# Patient Record
Sex: Female | Born: 1960 | Race: Black or African American | Hispanic: No | Marital: Single | Smoking: Current every day smoker
Health system: Southern US, Community
[De-identification: ages and names within clinical notes are randomized; demographics above are authoritative.]

## PROBLEM LIST (undated history)

## (undated) DIAGNOSIS — R4182 Altered mental status, unspecified: Secondary | ICD-10-CM

## (undated) DIAGNOSIS — F17200 Nicotine dependence, unspecified, uncomplicated: Secondary | ICD-10-CM

## (undated) DIAGNOSIS — M329 Systemic lupus erythematosus, unspecified: Secondary | ICD-10-CM

## (undated) DIAGNOSIS — M549 Dorsalgia, unspecified: Secondary | ICD-10-CM

## (undated) DIAGNOSIS — I213 ST elevation (STEMI) myocardial infarction of unspecified site: Secondary | ICD-10-CM

## (undated) DIAGNOSIS — E785 Hyperlipidemia, unspecified: Secondary | ICD-10-CM

## (undated) DIAGNOSIS — A048 Other specified bacterial intestinal infections: Secondary | ICD-10-CM

## (undated) DIAGNOSIS — K259 Gastric ulcer, unspecified as acute or chronic, without hemorrhage or perforation: Secondary | ICD-10-CM

## (undated) DIAGNOSIS — I10 Essential (primary) hypertension: Secondary | ICD-10-CM

## (undated) DIAGNOSIS — Z720 Tobacco use: Secondary | ICD-10-CM

## (undated) DIAGNOSIS — IMO0002 Reserved for concepts with insufficient information to code with codable children: Secondary | ICD-10-CM

## (undated) HISTORY — DX: Other specified bacterial intestinal infections: A04.8

## (undated) HISTORY — DX: Altered mental status, unspecified: R41.82

## (undated) HISTORY — PX: POLYPECTOMY: SHX149

## (undated) HISTORY — DX: Gastric ulcer, unspecified as acute or chronic, without hemorrhage or perforation: K25.9

## (undated) HISTORY — DX: Tobacco use: Z72.0

## (undated) HISTORY — DX: Essential (primary) hypertension: I10

## (undated) HISTORY — DX: Dorsalgia, unspecified: M54.9

---
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
## 2000-04-28 ENCOUNTER — Emergency Department (HOSPITAL_COMMUNITY): Admission: EM | Admit: 2000-04-28 | Discharge: 2000-04-28 | Payer: Self-pay | Admitting: Emergency Medicine

## 2000-04-29 ENCOUNTER — Encounter: Admission: RE | Admit: 2000-04-29 | Discharge: 2000-04-29 | Payer: Self-pay | Admitting: Internal Medicine

## 2007-07-16 ENCOUNTER — Emergency Department (HOSPITAL_COMMUNITY): Admission: EM | Admit: 2007-07-16 | Discharge: 2007-07-16 | Payer: Self-pay | Admitting: Emergency Medicine

## 2011-01-29 HISTORY — PX: UPPER GASTROINTESTINAL ENDOSCOPY: SHX188

## 2011-02-16 ENCOUNTER — Encounter (HOSPITAL_COMMUNITY): Payer: Self-pay | Admitting: *Deleted

## 2011-02-16 ENCOUNTER — Emergency Department (INDEPENDENT_AMBULATORY_CARE_PROVIDER_SITE_OTHER)
Admission: EM | Admit: 2011-02-16 | Discharge: 2011-02-16 | Disposition: A | Discharge status: Nursing Home (Includes ICF) | Payer: PRIVATE HEALTH INSURANCE | Source: Home / Self Care

## 2011-02-16 ENCOUNTER — Emergency Department (HOSPITAL_COMMUNITY)
Admission: EM | Admit: 2011-02-16 | Discharge: 2011-02-16 | Disposition: A | Discharge status: Home / Self Care | Payer: PRIVATE HEALTH INSURANCE | Attending: Emergency Medicine | Admitting: Emergency Medicine

## 2011-02-16 DIAGNOSIS — M79609 Pain in unspecified limb: Secondary | ICD-10-CM

## 2011-02-16 DIAGNOSIS — L03211 Cellulitis of face: Secondary | ICD-10-CM | POA: Insufficient documentation

## 2011-02-16 DIAGNOSIS — R110 Nausea: Secondary | ICD-10-CM | POA: Insufficient documentation

## 2011-02-16 DIAGNOSIS — M79606 Pain in leg, unspecified: Secondary | ICD-10-CM

## 2011-02-16 DIAGNOSIS — L0201 Cutaneous abscess of face: Secondary | ICD-10-CM

## 2011-02-16 DIAGNOSIS — M79605 Pain in left leg: Secondary | ICD-10-CM

## 2011-02-16 MED ORDER — OXYCODONE-ACETAMINOPHEN 5-325 MG PO TABS: 2.0000 | ORAL_TABLET | Freq: Four times a day (QID) | ORAL | Status: DC | PRN

## 2011-02-16 MED ORDER — SULFAMETHOXAZOLE-TMP DS 800-160 MG PO TABS
2.0000 | ORAL_TABLET | Freq: Once | ORAL | Status: AC
  Administered 2011-02-16: 2 via ORAL
  Filled 2011-02-16: qty 2

## 2011-02-16 MED ORDER — ONDANSETRON 4 MG PO TBDP
8.0000 mg | ORAL_TABLET | Freq: Once | ORAL | Status: AC
  Administered 2011-02-16: 8 mg via ORAL
  Filled 2011-02-16: qty 2

## 2011-02-16 MED ORDER — LIDOCAINE-PRILOCAINE 2.5-2.5 % EX CREA
TOPICAL_CREAM | Freq: Once | CUTANEOUS | Status: DC
  Filled 2011-02-16: qty 5

## 2011-02-16 MED ORDER — CEPHALEXIN 250 MG PO CAPS
500.0000 mg | ORAL_CAPSULE | Freq: Once | ORAL | Status: AC
  Administered 2011-02-16: 500 mg via ORAL
  Filled 2011-02-16: qty 2

## 2011-02-16 MED ORDER — OXYCODONE-ACETAMINOPHEN 5-325 MG PO TABS
2.0000 | ORAL_TABLET | Freq: Once | ORAL | Status: AC
  Administered 2011-02-16: 2 via ORAL
  Filled 2011-02-16: qty 2

## 2011-02-16 MED ORDER — CEPHALEXIN 500 MG PO CAPS: 500.0000 mg | ORAL_CAPSULE | Freq: Four times a day (QID) | ORAL | Status: DC

## 2011-02-16 MED ORDER — LIDOCAINE-EPINEPHRINE-TETRACAINE (LET) SOLUTION
3.0000 mL | Freq: Once | NASAL | Status: DC
  Filled 2011-02-16: qty 3

## 2011-02-16 MED ORDER — SULFAMETHOXAZOLE-TMP DS 800-160 MG PO TABS: 2.0000 | ORAL_TABLET | Freq: Two times a day (BID) | ORAL | Status: DC

## 2011-02-16 NOTE — ED Provider Notes (Signed)
History     CSN: [id number]  Arrival date & time 02/16/11  1036   None     Chief Complaint  Patient presents with  . Leg Pain    (Consider location/radiation/quality/duration/timing/severity/associated sxs/prior treatment) HPI Comments: Patient reports pain x 4 days and swelling x 3 days in her left calf.  Pain is worse with palpation and attempting to bear weight on that leg.  States pain and swelling have gotten progressively worse over 4 days.  Pt denies injury to the area.  Denies fevers, CP, SOB.  No recent immobilization, no exogenous estrogen, no personal or family hx of blood clots.  Pt is a smoker.    Patient is a 51 y.o. female presenting with leg pain. The history is provided by the patient.  Leg Pain     History reviewed. No pertinent past medical history.  History reviewed. No pertinent past surgical history.  History reviewed. No pertinent family history.  History  Substance Use Topics  . Smoking status: Current Everyday Smoker  . Smokeless tobacco: Not on file  . Alcohol Use: Yes    OB History    Grav Para Term Preterm Abortions TAB SAB Ect Mult Living                  Review of Systems  Constitutional: Negative for fever.  Respiratory: Negative for shortness of breath.   Cardiovascular: Positive for leg swelling. Negative for chest pain.  All other systems reviewed and are negative.    Allergies  Review of patient's allergies indicates no known allergies.  Home Medications   Current Outpatient Rx  Name Route Sig Dispense Refill  . OVER THE COUNTER MEDICATION  Goody powder this am      BP 130/75  Pulse 93  Temp(Src) 98.8 F (37.1 C) (Oral)  Resp 20  SpO2 100%  Physical Exam  Nursing note and vitals reviewed. Constitutional: She is oriented to person, place, and time. She appears well-developed and well-nourished.  HENT:  Head: Normocephalic and atraumatic.  Neck: Neck supple.  Cardiovascular: Normal rate, regular rhythm and normal  heart sounds.   Pulmonary/Chest: Effort normal. No respiratory distress. She has no wheezes. She has no rales.  Musculoskeletal:       Left lower leg: She exhibits tenderness and swelling.       Legs: Neurological: She is alert and oriented to person, place, and time.    ED Course  Procedures (including critical care time)  Labs Reviewed - No data to display No results found.   1. Pain, lower extremity       MDM  Patient with pain and swelling of left calf.  Concern for DVT.  Patient's only known risk factor for clot is smoking.  No signs or symptoms of PE.  Clinically no cellulitis.  Transferred to [facility] Emergency Department for further evaluation.          Rise Patience, [place] 02/16/11 1203

## 2011-02-16 NOTE — ED Notes (Signed)
Per pt awoke Wednesday am with pain left lower leg pain and swelling - increasingly worse pain with walking - abscess forehead draining onset x one week - pt squeezed and drained x one week ago - remains with redness and swelling

## 2011-02-16 NOTE — ED Provider Notes (Signed)
History     CSN: [id number]  Arrival date & time 02/16/11  1218   First MD Initiated Contact with Patient 02/16/11 1226      Chief Complaint  Patient presents with  . Leg Pain    (Consider location/radiation/quality/duration/timing/severity/associated sxs/prior treatment) HPI Patient is a 51 year old female who was seen initially this morning at urgent care for left leg pain. Patient has had left calf pain for the past 4 days. She's never had anything like this before. Patient has been on no recent long trips and denies any injury to the area. She's not had any chest pain or shortness of breath. She does not take any hormones and has no personal or family history of blood clots. She's not on any hormones for any reason. She does smoke. Patient also has a visible lesion over the right for head. There is a central head to this. There is appreciable fluctuance with this. Patient has no history of boils or cellulitis. She does report that she has been draining pus out of this by pressing on it. She denies any fevers, nausea, or vomiting. She is not a diabetic and has no history of health problems. She says the pain with this was minimal. Pain in the left leg is aching and a 6/10. History reviewed. No pertinent past medical history.  History reviewed. No pertinent past surgical history.  History reviewed. No pertinent family history.  History  Substance Use Topics  . Smoking status: Current Everyday Smoker  . Smokeless tobacco: Not on file  . Alcohol Use: Yes    OB History    Grav Para Term Preterm Abortions TAB SAB Ect Mult Living                  Review of Systems  Constitutional: Negative.   HENT: Negative.   Eyes: Negative.   Respiratory: Negative.   Cardiovascular: Positive for leg swelling.  Gastrointestinal: Negative.   Genitourinary: Negative.   Musculoskeletal:       Left calf tenderness  Skin:       Right forehead lesion  Neurological: Negative.   Hematological:  Negative.   Psychiatric/Behavioral: Negative.   All other systems reviewed and are negative.    Allergies  Review of patient's allergies indicates no known allergies.  Home Medications  No current outpatient prescriptions on file.  BP 113/72  Pulse 94  Temp(Src) 98.5 F (36.9 C) (Oral)  Resp 20  SpO2 97%  Physical Exam  Nursing note and vitals reviewed. Constitutional: She is oriented to person, place, and time. She appears well-developed and well-nourished. No distress.  HENT:  Head: Normocephalic and atraumatic.  Eyes: Conjunctivae and EOM are normal. Pupils are equal, round, and reactive to light.  Neck: Normal range of motion.  Cardiovascular: Normal rate, regular rhythm, normal heart sounds and intact distal pulses.  Exam reveals no gallop and no friction rub.   No murmur heard. Pulmonary/Chest: Effort normal and breath sounds normal. No respiratory distress. She has no wheezes. She has no rales.  Abdominal: Soft. Bowel sounds are normal. She exhibits no distension. There is no tenderness. There is no rebound and no guarding.  Musculoskeletal: Normal range of motion. She exhibits tenderness. She exhibits no edema.       Tenderness to palpation over the left  Neurological: She is alert and oriented to person, place, and time. No cranial nerve deficit. She exhibits normal muscle tone. Coordination normal.  Skin: Skin is warm and dry.  Lesion over the right for habitus a half inch in diameter with a central scabbed over her head. This is erythematous with underlying fluctuance.  Psychiatric: She has a normal mood and affect.    ED Course  Procedures (including critical care time)  Labs Reviewed - No data to display No results found.   1. Leg pain, left   2. Facial cellulitis   3. Abscess of forehead       MDM  Patient was evaluated and given 2 tabs of Percocet by mouth for pain. Ultrasound of the left lower extremity was ordered. Given patient's lesion on  her 4 head this should be treated as a cellulitis. Patient was given Keflex 500 mg by mouth as well as 2 tabs of Bactrim by mouth. EMLA cream was ordered to numb the area.  Patient had no DVT on ultrasound. There was prolonged. To receive numbing cream for patient's lesion on her face. Patient preferred to continue warm compresses at home. She's had success with drainage of the area I was comfortable with this. Patient was given her first dose of antibiotics here. She did feel nauseous here and required Zofran orally. Patient was discharged with 10 days worth of antibiotics for her facial cellulitis. She was also given pain medication for her leg pain. She was discharged in good condition told to return if she had any other emergent concerns.        Cyndra Numbers, MD 02/16/11 2114

## 2011-02-16 NOTE — Progress Notes (Signed)
*  PRELIMINARY RESULTS*  Left lower extremity venous Dopplers completed.  There is no DVT or SVT noted in the left lower extremity.  There is an area of mixed echoes noted in the mid, posterior calf, etiology unknown.    Sherren Kerns Renee 02/16/2011, 2:17 PM

## 2011-02-16 NOTE — ED Notes (Signed)
Pt transferrred here from [facility] to r/o dvt left leg.

## 2011-02-16 NOTE — ED Provider Notes (Signed)
Medical screening examination/treatment/procedure(s) were performed by non-physician practitioner and as supervising physician I was immediately available for consultation/collaboration.  Corrie Mckusick, MD 02/16/11 2137

## 2011-02-22 ENCOUNTER — Ambulatory Visit: Payer: Self-pay | Admitting: Family Medicine

## 2011-02-26 ENCOUNTER — Emergency Department (HOSPITAL_COMMUNITY): Payer: PRIVATE HEALTH INSURANCE

## 2011-02-26 ENCOUNTER — Other Ambulatory Visit: Payer: Self-pay

## 2011-02-26 ENCOUNTER — Ambulatory Visit: Payer: Self-pay | Admitting: Family Medicine

## 2011-02-26 ENCOUNTER — Inpatient Hospital Stay (HOSPITAL_COMMUNITY)
Admission: EM | Admit: 2011-02-26 | Discharge: 2011-02-28 | DRG: 864 | Disposition: A | Discharge status: Home / Self Care | Payer: PRIVATE HEALTH INSURANCE | Attending: Internal Medicine | Admitting: Internal Medicine

## 2011-02-26 ENCOUNTER — Encounter (HOSPITAL_COMMUNITY): Payer: Self-pay | Admitting: *Deleted

## 2011-02-26 DIAGNOSIS — N179 Acute kidney failure, unspecified: Secondary | ICD-10-CM | POA: Diagnosis present

## 2011-02-26 DIAGNOSIS — L03211 Cellulitis of face: Secondary | ICD-10-CM | POA: Diagnosis present

## 2011-02-26 DIAGNOSIS — R51 Headache: Secondary | ICD-10-CM | POA: Diagnosis present

## 2011-02-26 DIAGNOSIS — E871 Hypo-osmolality and hyponatremia: Secondary | ICD-10-CM | POA: Diagnosis present

## 2011-02-26 DIAGNOSIS — E878 Other disorders of electrolyte and fluid balance, not elsewhere classified: Secondary | ICD-10-CM | POA: Diagnosis present

## 2011-02-26 DIAGNOSIS — E860 Dehydration: Secondary | ICD-10-CM | POA: Diagnosis present

## 2011-02-26 DIAGNOSIS — R509 Fever, unspecified: Principal | ICD-10-CM | POA: Diagnosis present

## 2011-02-26 DIAGNOSIS — R7989 Other specified abnormal findings of blood chemistry: Secondary | ICD-10-CM | POA: Diagnosis present

## 2011-02-26 DIAGNOSIS — L0201 Cutaneous abscess of face: Secondary | ICD-10-CM | POA: Diagnosis present

## 2011-02-26 LAB — DIFFERENTIAL
Basophils Absolute: 0 10*3/uL (ref 0.0–0.1)
Basophils Relative: 0 % (ref 0–1)
Eosinophils Absolute: 0 10*3/uL (ref 0.0–0.7)
Eosinophils Relative: 0 % (ref 0–5)
Lymphocytes Relative: 17 % (ref 12–46)
Lymphs Abs: 0.9 10*3/uL (ref 0.7–4.0)
Monocytes Absolute: 0.2 10*3/uL (ref 0.1–1.0)
Monocytes Relative: 4 % (ref 3–12)
Neutro Abs: 3.9 10*3/uL (ref 1.7–7.7)
Neutrophils Relative %: 78 % — ABNORMAL HIGH (ref 43–77)

## 2011-02-26 LAB — COMPREHENSIVE METABOLIC PANEL
ALT: 168 U/L — ABNORMAL HIGH (ref 0–35)
AST: 231 U/L — ABNORMAL HIGH (ref 0–37)
Albumin: 3.2 g/dL — ABNORMAL LOW (ref 3.5–5.2)
Alkaline Phosphatase: 157 U/L — ABNORMAL HIGH (ref 39–117)
BUN: 21 mg/dL (ref 6–23)
CO2: 20 mEq/L (ref 19–32)
Calcium: 9 mg/dL (ref 8.4–10.5)
Chloride: 94 mEq/L — ABNORMAL LOW (ref 96–112)
Creatinine, Ser: 1.54 mg/dL — ABNORMAL HIGH (ref 0.50–1.10)
GFR calc Af Amer: 45 mL/min — ABNORMAL LOW (ref >90)
GFR calc non Af Amer: 39 mL/min — ABNORMAL LOW (ref >90)
Glucose, Bld: 105 mg/dL — ABNORMAL HIGH (ref 70–99)
Potassium: 4.1 mEq/L (ref 3.5–5.1)
Sodium: 129 mEq/L — ABNORMAL LOW (ref 135–145)
Total Bilirubin: 0.1 mg/dL — ABNORMAL LOW (ref 0.3–1.2)
Total Protein: 8.5 g/dL — ABNORMAL HIGH (ref 6.0–8.3)

## 2011-02-26 LAB — URINE MICROSCOPIC-ADD ON

## 2011-02-26 LAB — CBC
HCT: 38.1 % (ref 36.0–46.0)
Hemoglobin: 13.1 g/dL (ref 12.0–15.0)
MCH: 29 pg (ref 26.0–34.0)
MCHC: 34.4 g/dL (ref 30.0–36.0)
MCV: 84.3 fL (ref 78.0–100.0)
Platelets: 239 10*3/uL (ref 150–400)
RBC: 4.52 MIL/uL (ref 3.87–5.11)
RDW: 13.1 % (ref 11.5–15.5)
WBC: 5 10*3/uL (ref 4.0–10.5)

## 2011-02-26 LAB — URINALYSIS, ROUTINE W REFLEX MICROSCOPIC
Bilirubin Urine: NEGATIVE
Glucose, UA: NEGATIVE mg/dL
Hgb urine dipstick: NEGATIVE
Ketones, ur: NEGATIVE mg/dL
Nitrite: NEGATIVE
Protein, ur: 30 mg/dL — AB
Specific Gravity, Urine: 1.014 (ref 1.005–1.030)
Urobilinogen, UA: 1 mg/dL (ref 0.0–1.0)
pH: 5.5 (ref 5.0–8.0)

## 2011-02-26 LAB — POCT PREGNANCY, URINE: Preg Test, Ur: NEGATIVE

## 2011-02-26 LAB — LIPASE, BLOOD: Lipase: 46 U/L (ref 11–59)

## 2011-02-26 LAB — PROTIME-INR
INR: 1.14 (ref 0.00–1.49)
Prothrombin Time: 14.8 seconds (ref 11.6–15.2)

## 2011-02-26 LAB — LACTIC ACID, PLASMA: Lactic Acid, Venous: 0.9 mmol/L (ref 0.5–2.2)

## 2011-02-26 MED ORDER — SODIUM CHLORIDE 0.9 % IV BOLUS (SEPSIS)
1000.0000 mL | Freq: Once | INTRAVENOUS | Status: AC
  Administered 2011-02-26: 1000 mL via INTRAVENOUS

## 2011-02-26 MED ORDER — ZOLPIDEM TARTRATE 5 MG PO TABS: 5.0000 mg | ORAL_TABLET | Freq: Every evening | ORAL | Status: DC | PRN

## 2011-02-26 MED ORDER — OXYCODONE HCL 5 MG PO TABS
5.0000 mg | ORAL_TABLET | ORAL | Status: DC | PRN
  Administered 2011-02-27: 5 mg via ORAL
  Filled 2011-02-26: qty 1

## 2011-02-26 MED ORDER — ONDANSETRON HCL 4 MG/2ML IJ SOLN: 4.0000 mg | Freq: Four times a day (QID) | INTRAMUSCULAR | Status: DC | PRN

## 2011-02-26 MED ORDER — ALUM & MAG HYDROXIDE-SIMETH 200-200-20 MG/5ML PO SUSP: 30.0000 mL | Freq: Four times a day (QID) | ORAL | Status: DC | PRN

## 2011-02-26 MED ORDER — ACETAMINOPHEN 500 MG PO TABS
1000.0000 mg | ORAL_TABLET | Freq: Once | ORAL | Status: AC
  Administered 2011-02-26: 1000 mg via ORAL
  Filled 2011-02-26: qty 2

## 2011-02-26 MED ORDER — OSELTAMIVIR PHOSPHATE 75 MG PO CAPS
75.0000 mg | ORAL_CAPSULE | Freq: Once | ORAL | Status: AC
  Administered 2011-02-26: 75 mg via ORAL
  Filled 2011-02-26: qty 1

## 2011-02-26 MED ORDER — OSELTAMIVIR PHOSPHATE 75 MG PO CAPS
75.0000 mg | ORAL_CAPSULE | Freq: Two times a day (BID) | ORAL | Status: DC
  Administered 2011-02-26 – 2011-02-28 (×4): 75 mg via ORAL
  Filled 2011-02-26 (×5): qty 1

## 2011-02-26 MED ORDER — ONDANSETRON HCL 4 MG/2ML IJ SOLN
4.0000 mg | Freq: Once | INTRAMUSCULAR | Status: AC
  Administered 2011-02-26: 4 mg via INTRAVENOUS
  Filled 2011-02-26: qty 2

## 2011-02-26 MED ORDER — MORPHINE SULFATE 4 MG/ML IJ SOLN
4.0000 mg | Freq: Once | INTRAMUSCULAR | Status: AC
  Administered 2011-02-26: 4 mg via INTRAVENOUS
  Filled 2011-02-26: qty 1

## 2011-02-26 MED ORDER — SODIUM CHLORIDE 0.9 % IV SOLN
INTRAVENOUS | Status: DC
  Administered 2011-02-27: 02:00:00 via INTRAVENOUS
  Administered 2011-02-27: 1000 mL via INTRAVENOUS
  Administered 2011-02-28: 09:00:00 via INTRAVENOUS

## 2011-02-26 MED ORDER — ONDANSETRON HCL 4 MG PO TABS: 4.0000 mg | ORAL_TABLET | Freq: Four times a day (QID) | ORAL | Status: DC | PRN

## 2011-02-26 NOTE — ED Notes (Signed)
Pt reports headache, recurrent skin infections, and vomiting x 2 days. Reports knot in forehead, knot to right side of neck and pain to left lower leg. Pt had an ultrasound to r/o DVT to left leg last week. Reports unable to keep food/fluids down x 2 days. Headache since Saturday.

## 2011-02-26 NOTE — H&P (Signed)
History and Physical  Samantha [name] WUJ:[id number] DOB: 1960/10/07 DOA: 02/26/2011  Referring physician: Judie Petit. Alto Denver, MD PCP: None  Chief Complaint: Vomiting.  HPI:  51 year old woman presented to the emergency department today with complaint of headache for the last several days (now resolved), myalgias, nausea, vomiting for 2 days. She did not get the flu vaccine. She has not been able to eat or drink for several days to it was unable to finish her antibiotics secondary to nausea and vomiting. She had leg pain in the left leg a week ago however this has subsequently resolved. She put alcohol on her leg because it was sore and now has a rash on that side.   Workup in the emergency department was notable for hyponatremia, renal dysfunction, dehydration, elevated LFTs. While in the emergency department she suddenly became febrile (103.2). Remainder of vital signs were unremarkable. Abdominal ultrasound was unremarkable, as was CT of the head and chest x-ray. Blood cultures and urine culture obtained. Patient was referred for admission.   January 19: Seen in the emergency department: Presented for leg pain. Ultrasound was negative for DVT. On facial cellulitis/abscess of the head. Placed on Keflex and Bactrim. Patient refused incision and drainage.  Review of Systems:  Negative for fever at home, changes to her vision, sore throat, chest pain, shortness of breath, cough, dysuria, bleeding, diarrhea, abdominal pain.  Positive for recent facial cellulitis which has resolved, rash left lower leg, otherwise as above.  Past medical history: None.   Past surgical history: None.   Social History:  reports that she has been smoking.  She does not have any smokeless tobacco history on file. She reports that she drinks alcohol. She reports that she does not use illicit drugs.  No Known Allergies  Family History  Problem Relation Age of Onset  . Diabetes Sister     Prior to Admission medications     Medication Sig Start Date End Date Taking? Authorizing Provider  cephALEXin (KEFLEX) 500 MG capsule Take 500 mg by mouth 4 (four) times daily. 10 day course 02/15/11  Yes Meagan Hunt, MD  oxyCODONE-acetaminophen (PERCOCET) 5-325 MG per tablet Take 2 tablets by mouth every 6 (six) hours as needed. 02/16/11 02/26/11 Yes Meagan Hunt, MD  sulfamethoxazole-trimethoprim (BACTRIM DS) 800-160 MG per tablet Take 2 tablets by mouth 2 (two) times daily. 10 day course 02/15/11  Yes Cyndra Numbers, MD   Physical Exam: Filed Vitals:   02/26/11 0949 02/26/11 1528 02/26/11 1653  BP: 114/68 132/70 104/58  Pulse: 104 104 93  Temp: 98.7 F (37.1 C) 103.2 F (39.6 C) 100.7 F (38.2 C)  TempSrc: Oral Oral Oral  Resp: 16    SpO2: 97% 97% 98%     General:  Appears calm, mildly ill. Nontoxic.   Eyes: Pupils equal, round, react to light. Normal lids, irises, conjunctiva.   ENT: Hearing grossly normal. Gross normal lips and tongue.   Neck: Anterior cervical lymphadenopathy, nontender. No thyromegaly.   Cardiovascular: Regular rate and rhythm. No murmur, rub, gallop. No lower extremity edema.   Respiratory: Clear to auscultation bilaterally. No wheezes, rales, rhonchi. Normal respiratory effort.   Abdomen: Soft, nontender, nondistended.   Skin: No acute or rash over the anterior surface of the left lower leg. Nontender. No purpura, petechiae or confluence. No fluctuance. Has the appearance of a razor burn. The face/forehead appears to be healing well. No evidence of cellulitis.  Musculoskeletal: No pain in left thigh or calf. Grossly unremarkable.   Psychiatric: Grossly  normal mood and affect. Speech is fluent and appropriate.   Neurologic: Grossly unremarkable.  Labs on Admission:  Basic Metabolic Panel:  Lab 02/26/11 4540  NA 129*  K 4.1  CL 94*  CO2 20  GLUCOSE 105*  BUN 21  CREATININE 1.54*  CALCIUM 9.0  MG --  PHOS --   Liver Function Tests:  Lab 02/26/11 1034  AST 231*  ALT 168*   ALKPHOS 157*  BILITOT 0.1*  PROT 8.5*  ALBUMIN 3.2*    Lab 02/26/11 1034  LIPASE 46  AMYLASE --   CBC:  Lab 02/26/11 1034  WBC 5.0  NEUTROABS 3.9  HGB 13.1  HCT 38.1  MCV 84.3  PLT 239    Radiological Exams on Admission: Dg Chest 2 View  02/26/2011  *RADIOLOGY REPORT*  Clinical Data:  Headache and vomiting  CHEST - 2 VIEW  Comparison: Chest radiograph 07/16/2007  Findings: Normal mediastinum and cardiac silhouette.  Normal pulmonary  vasculature.  No evidence of effusion, infiltrate, or pneumothorax.  No acute bony abnormality.  IMPRESSION: No acute cardiopulmonary process.  Original Report Authenticated By: Genevive Bi, M.D.   Ct Head Wo Contrast  02/26/2011  *RADIOLOGY REPORT*  Clinical Data: Headache, nausea, vomiting.  CT HEAD WITHOUT CONTRAST  Technique:  Contiguous axial images were obtained from the base of the skull through the vertex without contrast.  Comparison: None.  Findings: No acute intracranial abnormality.  Specifically, no hemorrhage, hydrocephalus, mass lesion, acute infarction, or significant intracranial injury.  No acute calvarial abnormality. Visualized paranasal sinuses and mastoids clear.  Orbital soft tissues unremarkable.  IMPRESSION: Normal study.  Original Report Authenticated By: Cyndie Chime, M.D.   US Abdomen Complete  02/26/2011  *RADIOLOGY REPORT*  Clinical Data:  Elevated liver function tests.  Nausea and vomiting.  COMPLETE ABDOMINAL ULTRASOUND 02/26/2011:  Comparison:  None.  Findings:  Gallbladder:  No shadowing gallstones or echogenic sludge.  No gallbladder wall thickening or pericholecystic fluid.  Negative sonographic Murphy's sign according to the ultrasound technologist.  Common bile duct:  Normal in caliber with maximum diameter approximating 4 mm.  Liver:  Normal size and echotexture without focal parenchymal abnormality.  Patent portal vein with hepatopetal flow.  IVC:  Patent.  Pancreas:  Normal size and echotexture without focal  parenchymal abnormality.  Spleen:  Normal size and echotexture without focal parenchymal abnormality.  Right Kidney:  No hydronephrosis.  Well-preserved cortex.  Normal parenchymal echotexture.  Simple cyst arising from the upper pole measuring approximately 6.6 x 5.0 x 5.4 cm.  No significant focal parenchymal abnormality.  No visible shadowing calculi. Approximately 12.9 cm in length.  Left Kidney:  No hydronephrosis.  Well-preserved cortex.  No shadowing calculi.  Normal size and parenchymal echotexture without focal abnormalities.  Approximately 13.2 cm in length.  Abdominal aorta:  Normal in caliber throughout its visualized course in the abdomen without significant atherosclerosis.  IMPRESSION:  1.  No abnormalities involving the liver, gallbladder, or biliary tree to explain elevated liver function tests. 2.  Approximate 6.6 cm simple cyst arising from the upper pole of the right kidney.  No significant abnormalities.  Original Report Authenticated By: Arnell Sieving, M.D.    EKG: Independently reviewed.  Sinus rhythm, bilateral atrial enlargement, left ventricular hypertrophy. No acute changes seen. No previous EKG available for comparison.   Assessment/Plan 1. Febrile illness: Myalgias, nausea, vomiting, headache. Somewhat suspicious for influenza. Empiric Tamiflu. IV fluids and supportive care. The patient is nontoxic and lactic acid is normal. Doubt bacterial  infection at this point. Follow clinically. 2. Dehydration/Hyponatremia/hypochloremia: Secondary to nausea and vomiting and poor oral intake. IV fluids. Repeat metabolic panel in the morning. 3. Acute renal failure versus chronic kidney disease: No previous studies available for comparison purposes. IV fluids. Recheck metabolic panel morning. 4. Mixed elevation of liver function tests: Etiology and significance unclear. Repeat hepatic function panel in the morning. Normal lipase and no right upper quadrant pain or discrete symptoms  referable to the gallbladder. Ultrasound was unremarkable. Could consider acute hepatitis panel if no improvement. 5. Recent facial cellulitis/abscess: Appears resolved. Will not continue antibiotics at this point.  Code Status: Full code. Family Communication: Discussed with sister and mother at bedside. Disposition Plan: Home when improved.  Brendia Sacks, MD  [facility] Pager [phone] 02/26/2011, 5:03 PM

## 2011-02-26 NOTE — ED Notes (Signed)
Attempted to call report. Nurse unavailable.  Will call back for report.

## 2011-02-26 NOTE — ED Notes (Signed)
Notified MD of pt. Elevated temp.

## 2011-02-26 NOTE — H&P (Deleted)
History and Physical  Samantha [name] WJX:[id number] DOB: Oct 10, 1960 DOA: 02/26/2011  Referring physician: Judie Petit. Alto [place], MD PCP: None  Chief Complaint: Vomiting.  HPI:  51 year old woman presented to the emergency department today with complaint of headache for the last several days (now resolved), myalgias, nausea, vomiting for 2 days. She did not get the flu vaccine. She has not been able to eat or drink for several days to it was unable to finish her antibiotics secondary to nausea and vomiting. She had leg pain in the left leg a week ago however this has subsequently resolved. She put alcohol on her leg because it was sore and now has a rash on that side.   Workup in the emergency department was notable for hyponatremia, renal dysfunction, dehydration, elevated LFTs. While in the emergency department she suddenly became febrile (103.2). Remainder of vital signs were unremarkable. Abdominal ultrasound was unremarkable, as was CT of the head and chest x-ray. Blood cultures and urine culture obtained. Patient was referred for admission.   January 19: Seen in the emergency department: Presented for leg pain. Ultrasound was negative for DVT. On facial cellulitis/abscess of the head. Placed on Keflex and Bactrim. Patient refused incision and drainage.  Review of Systems:  Negative for fever at home, changes to her vision, sore throat, chest pain, shortness of breath, cough, dysuria, bleeding, diarrhea, abdominal pain.  Positive for recent facial cellulitis which has resolved, rash left lower leg, otherwise as above.  Past medical history: None.   Past surgical history: None.   Social History:  reports that she has been smoking.  She does not have any smokeless tobacco history on file. She reports that she drinks alcohol. She reports that she does not use illicit drugs.  No Known Allergies  Family History  Problem Relation Age of Onset  . Diabetes Sister     Prior to Admission medications     Medication Sig Start Date End Date Taking? Authorizing Provider  cephALEXin (KEFLEX) 500 MG capsule Take 500 mg by mouth 4 (four) times daily. 10 day course 02/15/11  Yes Meagan Hunt, MD  oxyCODONE-acetaminophen (PERCOCET) 5-325 MG per tablet Take 2 tablets by mouth every 6 (six) hours as needed. 02/16/11 02/26/11 Yes Meagan Hunt, MD  sulfamethoxazole-trimethoprim (BACTRIM DS) 800-160 MG per tablet Take 2 tablets by mouth 2 (two) times daily. 10 day course 02/15/11  Yes Cyndra Numbers, MD   Physical Exam: Filed Vitals:   02/26/11 0949 02/26/11 1528 02/26/11 1653 02/26/11 1839  BP: 114/68 132/70 104/58 102/64  Pulse: 104 104 93 98  Temp: 98.7 F (37.1 C) 103.2 F (39.6 C) 100.7 F (38.2 C) 100.2 F (37.9 C)  TempSrc: Oral Oral Oral Oral  Resp: 16   16  Height:    5\' 8"  (1.727 m)  Weight:    64.638 kg (142 lb 8 oz)  SpO2: 97% 97% 98% 98%     General:  Appears calm, mildly ill. Nontoxic.   Eyes: Pupils equal, round, react to light. Normal lids, irises, conjunctiva.   ENT: Hearing grossly normal. Gross normal lips and tongue.   Neck: Anterior cervical lymphadenopathy, nontender. No thyromegaly.   Cardiovascular: Regular rate and rhythm. No murmur, rub, gallop. No lower extremity edema.   Respiratory: Clear to auscultation bilaterally. No wheezes, rales, rhonchi. Normal respiratory effort.   Abdomen: Soft, nontender, nondistended.   Skin: No acute or rash over the anterior surface of the left lower leg. Nontender. No purpura, petechiae or confluence. No fluctuance. Has the  appearance of a razor burn. The face/forehead appears to be healing well. No evidence of cellulitis.  Musculoskeletal: No pain in left thigh or calf. Grossly unremarkable.   Psychiatric: Grossly normal mood and affect. Speech is fluent and appropriate.   Neurologic: Grossly unremarkable.  Labs on Admission:  Basic Metabolic Panel:  Lab 02/26/11 9563  NA 129*  K 4.1  CL 94*  CO2 20  GLUCOSE 105*  BUN 21   CREATININE 1.54*  CALCIUM 9.0  MG --  PHOS --   Liver Function Tests:  Lab 02/26/11 1034  AST 231*  ALT 168*  ALKPHOS 157*  BILITOT 0.1*  PROT 8.5*  ALBUMIN 3.2*    Lab 02/26/11 1034  LIPASE 46  AMYLASE --   CBC:  Lab 02/26/11 1034  WBC 5.0  NEUTROABS 3.9  HGB 13.1  HCT 38.1  MCV 84.3  PLT 239    Radiological Exams on Admission: Dg Chest 2 View  02/26/2011  *RADIOLOGY REPORT*  Clinical Data:  Headache and vomiting  CHEST - 2 VIEW  Comparison: Chest radiograph 07/16/2007  Findings: Normal mediastinum and cardiac silhouette.  Normal pulmonary  vasculature.  No evidence of effusion, infiltrate, or pneumothorax.  No acute bony abnormality.  IMPRESSION: No acute cardiopulmonary process.  Original Report Authenticated By: Genevive Bi, M.D.   Ct Head Wo Contrast  02/26/2011  *RADIOLOGY REPORT*  Clinical Data: Headache, nausea, vomiting.  CT HEAD WITHOUT CONTRAST  Technique:  Contiguous axial images were obtained from the base of the skull through the vertex without contrast.  Comparison: None.  Findings: No acute intracranial abnormality.  Specifically, no hemorrhage, hydrocephalus, mass lesion, acute infarction, or significant intracranial injury.  No acute calvarial abnormality. Visualized paranasal sinuses and mastoids clear.  Orbital soft tissues unremarkable.  IMPRESSION: Normal study.  Original Report Authenticated By: Cyndie Chime, M.D.   US Abdomen Complete  02/26/2011  *RADIOLOGY REPORT*  Clinical Data:  Elevated liver function tests.  Nausea and vomiting.  COMPLETE ABDOMINAL ULTRASOUND 02/26/2011:  Comparison:  None.  Findings:  Gallbladder:  No shadowing gallstones or echogenic sludge.  No gallbladder wall thickening or pericholecystic fluid.  Negative sonographic Murphy's sign according to the ultrasound technologist.  Common bile duct:  Normal in caliber with maximum diameter approximating 4 mm.  Liver:  Normal size and echotexture without focal parenchymal  abnormality.  Patent portal vein with hepatopetal flow.  IVC:  Patent.  Pancreas:  Normal size and echotexture without focal parenchymal abnormality.  Spleen:  Normal size and echotexture without focal parenchymal abnormality.  Right Kidney:  No hydronephrosis.  Well-preserved cortex.  Normal parenchymal echotexture.  Simple cyst arising from the upper pole measuring approximately 6.6 x 5.0 x 5.4 cm.  No significant focal parenchymal abnormality.  No visible shadowing calculi. Approximately 12.9 cm in length.  Left Kidney:  No hydronephrosis.  Well-preserved cortex.  No shadowing calculi.  Normal size and parenchymal echotexture without focal abnormalities.  Approximately 13.2 cm in length.  Abdominal aorta:  Normal in caliber throughout its visualized course in the abdomen without significant atherosclerosis.  IMPRESSION:  1.  No abnormalities involving the liver, gallbladder, or biliary tree to explain elevated liver function tests. 2.  Approximate 6.6 cm simple cyst arising from the upper pole of the right kidney.  No significant abnormalities.  Original Report Authenticated By: Arnell Sieving, M.D.    EKG: Independently reviewed.  Sinus rhythm, bilateral atrial enlargement, left ventricular hypertrophy. No acute changes seen. No previous EKG available for comparison.  Assessment/Plan 1. Febrile illness: Myalgias, nausea, vomiting, headache. Somewhat suspicious for influenza. Empiric Tamiflu. IV fluids and supportive care. The patient is nontoxic and lactic acid is normal. Doubt bacterial infection at this point. Follow clinically. 2. Dehydration/Hyponatremia/hypochloremia: Secondary to nausea and vomiting and poor oral intake. IV fluids. Repeat metabolic panel in the morning. 3. Acute renal failure versus chronic kidney disease: No previous studies available for comparison purposes. IV fluids. Recheck metabolic panel morning. 4. Mixed elevation of liver function tests: Etiology and significance  unclear. Repeat hepatic function panel in the morning. Normal lipase and no right upper quadrant pain or discrete symptoms referable to the gallbladder. Ultrasound was unremarkable. Could consider acute hepatitis panel if no improvement. 5. Recent facial cellulitis/abscess: Appears resolved. Will not continue antibiotics at this point.  Code Status: Full code. Family Communication: Discussed with sister and mother at bedside. Disposition Plan: Home when improved.  Brendia Sacks, MD  [facility] Pager [phone] 02/26/2011, 8:56 PM

## 2011-02-26 NOTE — ED Notes (Signed)
Pt unable to obtain urine specimen at this time. Will attempt again after fluids finish.

## 2011-02-26 NOTE — ED Notes (Signed)
Patient medicated for fever.

## 2011-02-27 LAB — COMPREHENSIVE METABOLIC PANEL
ALT: 174 U/L — ABNORMAL HIGH (ref 0–35)
AST: 248 U/L — ABNORMAL HIGH (ref 0–37)
Albumin: 2.4 g/dL — ABNORMAL LOW (ref 3.5–5.2)
Alkaline Phosphatase: 109 U/L (ref 39–117)
BUN: 7 mg/dL (ref 6–23)
CO2: 20 mEq/L (ref 19–32)
Calcium: 7.8 mg/dL — ABNORMAL LOW (ref 8.4–10.5)
Chloride: 97 mEq/L (ref 96–112)
Creatinine, Ser: 0.57 mg/dL (ref 0.50–1.10)
GFR calc Af Amer: >90 mL/min (ref >90)
GFR calc non Af Amer: >90 mL/min (ref >90)
Glucose, Bld: 104 mg/dL — ABNORMAL HIGH (ref 70–99)
Potassium: 4.1 mEq/L (ref 3.5–5.1)
Sodium: 127 mEq/L — ABNORMAL LOW (ref 135–145)
Total Bilirubin: 0.1 mg/dL — ABNORMAL LOW (ref 0.3–1.2)
Total Protein: 6.5 g/dL (ref 6.0–8.3)

## 2011-02-27 LAB — INFLUENZA PANEL BY PCR (TYPE A & B)
H1N1 flu by pcr: NOT DETECTED
Influenza A By PCR: NEGATIVE
Influenza B By PCR: NEGATIVE

## 2011-02-27 LAB — CBC
HCT: 31.7 % — ABNORMAL LOW (ref 36.0–46.0)
Hemoglobin: 10.4 g/dL — ABNORMAL LOW (ref 12.0–15.0)
MCH: 28.2 pg (ref 26.0–34.0)
MCHC: 33.1 g/dL (ref 30.0–36.0)
MCV: 85 fL (ref 78.0–100.0)
Platelets: 218 10*3/uL (ref 150–400)
RBC: 3.73 MIL/uL — ABNORMAL LOW (ref 3.87–5.11)
RDW: 13.3 % (ref 11.5–15.5)
WBC: 3.4 10*3/uL — ABNORMAL LOW (ref 4.0–10.5)

## 2011-02-27 LAB — RAPID STREP SCREEN (MED CTR MEBANE ONLY): Streptococcus, Group A Screen (Direct): NEGATIVE

## 2011-02-27 MED ORDER — DEXTROSE 5 % IV SOLN
1.0000 g | INTRAVENOUS | Status: DC
  Administered 2011-02-27: 1 g via INTRAVENOUS
  Filled 2011-02-27 (×2): qty 10

## 2011-02-27 NOTE — Progress Notes (Signed)
Subjective:  Patient relates she feels about the same. Objective: Filed Vitals:   02/26/11 2150 02/27/11 0300 02/27/11 0520 02/27/11 1355  BP: 120/70  115/60 113/67  Pulse: 90  95 90  Temp: 101.9 F (38.8 C) 102.9 F (39.4 C) 102.9 F (39.4 C) 100.6 F (38.1 C)  TempSrc: Oral Oral Oral Oral  Resp: 18  18 18   Height:      Weight:      SpO2: 98%  96% 98%   Weight change:   Intake/Output Summary (Last 24 hours) at 02/27/11 1705 Last data filed at 02/27/11 1300  Gross per 24 hour  Intake   1390 ml  Output   2150 ml  Net   -760 ml    General: Alert, awake, oriented x3, in no acute distress.  HEENT: No bruits, no goiter. tonsilar exudates. Enlarge lymph nodes. Heart: Regular rate and rhythm, without murmurs, rubs, gallops.  Lungs: Crackles left side, bilateral air movement.  Abdomen: Soft, nontender, nondistended, positive bowel sounds.  Neuro: Grossly intact, nonfocal.   Lab Results:  Basename 02/27/11 0355 02/26/11 1034  NA 127* 129*  K 4.1 4.1  CL 97 94*  CO2 20 20  GLUCOSE 104* 105*  BUN 7 21  CREATININE 0.57 1.54*  CALCIUM 7.8* 9.0  MG -- --  PHOS -- --    Basename 02/27/11 0355 02/26/11 1034  AST 248* 231*  ALT 174* 168*  ALKPHOS 109 157*  BILITOT 0.1* 0.1*  PROT 6.5 8.5*  ALBUMIN 2.4* 3.2*    Basename 02/26/11 1034  LIPASE 46  AMYLASE --    Basename 02/27/11 0355 02/26/11 1034  WBC 3.4* 5.0  NEUTROABS -- 3.9  HGB 10.4* 13.1  HCT 31.7* 38.1  MCV 85.0 84.3  PLT 218 239   No results found for this basename: CKTOTAL:3,CKMB:3,CKMBINDEX:3,TROPONINI:3 in the last 72 hours No components found with this basename: POCBNP:3 No results found for this basename: DDIMER:2 in the last 72 hours No results found for this basename: HGBA1C:2 in the last 72 hours No results found for this basename: CHOL:2,HDL:2,LDLCALC:2,TRIG:2,CHOLHDL:2,LDLDIRECT:2 in the last 72 hours No results found for this basename: TSH,T4TOTAL,FREET3,T3FREE,THYROIDAB in the last 72  hours No results found for this basename: VITAMINB12:2,FOLATE:2,FERRITIN:2,TIBC:2,IRON:2,RETICCTPCT:2 in the last 72 hours  Micro Results: No results found for this or any previous visit (from the past 240 hour(s)).  Studies/Results: Dg Chest 2 View  02/26/2011  *RADIOLOGY REPORT*  Clinical Data:  Headache and vomiting  CHEST - 2 VIEW  Comparison: Chest radiograph 07/16/2007  Findings: Normal mediastinum and cardiac silhouette.  Normal pulmonary  vasculature.  No evidence of effusion, infiltrate, or pneumothorax.  No acute bony abnormality.  IMPRESSION: No acute cardiopulmonary process.  Original Report Authenticated By: Genevive Bi, M.D.   Ct Head Wo Contrast  02/26/2011  *RADIOLOGY REPORT*  Clinical Data: Headache, nausea, vomiting.  CT HEAD WITHOUT CONTRAST  Technique:  Contiguous axial images were obtained from the base of the skull through the vertex without contrast.  Comparison: None.  Findings: No acute intracranial abnormality.  Specifically, no hemorrhage, hydrocephalus, mass lesion, acute infarction, or significant intracranial injury.  No acute calvarial abnormality. Visualized paranasal sinuses and mastoids clear.  Orbital soft tissues unremarkable.  IMPRESSION: Normal study.  Original Report Authenticated By: Cyndie Chime, M.D.   US Abdomen Complete  02/26/2011  *RADIOLOGY REPORT*  Clinical Data:  Elevated liver function tests.  Nausea and vomiting.  COMPLETE ABDOMINAL ULTRASOUND 02/26/2011:  Comparison:  None.  Findings:  Gallbladder:  No shadowing  gallstones or echogenic sludge.  No gallbladder wall thickening or pericholecystic fluid.  Negative sonographic Murphy's sign according to the ultrasound technologist.  Common bile duct:  Normal in caliber with maximum diameter approximating 4 mm.  Liver:  Normal size and echotexture without focal parenchymal abnormality.  Patent portal vein with hepatopetal flow.  IVC:  Patent.  Pancreas:  Normal size and echotexture without focal  parenchymal abnormality.  Spleen:  Normal size and echotexture without focal parenchymal abnormality.  Right Kidney:  No hydronephrosis.  Well-preserved cortex.  Normal parenchymal echotexture.  Simple cyst arising from the upper pole measuring approximately 6.6 x 5.0 x 5.4 cm.  No significant focal parenchymal abnormality.  No visible shadowing calculi. Approximately 12.9 cm in length.  Left Kidney:  No hydronephrosis.  Well-preserved cortex.  No shadowing calculi.  Normal size and parenchymal echotexture without focal abnormalities.  Approximately 13.2 cm in length.  Abdominal aorta:  Normal in caliber throughout its visualized course in the abdomen without significant atherosclerosis.  IMPRESSION:  1.  No abnormalities involving the liver, gallbladder, or biliary tree to explain elevated liver function tests. 2.  Approximate 6.6 cm simple cyst arising from the upper pole of the right kidney.  No significant abnormalities.  Original Report Authenticated By: Arnell Sieving, M.D.    Medications: I have reviewed the patient's current medications.  Assessment and plan: Active Problems: 1. Fever: Start rocephin rapid strep throat. Exudate, fever enlarged lymph nodes. Check an HIV  2. Dehydration Resolved with IV fluids.  3. Hyponatremia Continue IV fluids.  4. Acute renal failure Resolved.  5. Elevated LFTs: LFT's still high will check and hepatitis panel and HIV.      LOS: 1 day   Marinda Elk M.D. Pager: [phone] [facility] 02/27/2011, 5:05 PM

## 2011-02-27 NOTE — Progress Notes (Signed)
Pt had fever throughout night of 102.9 to 103. Pt has elevated LFTs and no Tylenol ordered. Tried incentive spirometer and cold cloth to forehead with no results. Blood cultures already ordered in ED on 02/26/11--awaiting results.

## 2011-02-28 LAB — HEPATITIS PANEL, ACUTE
HCV Ab: NEGATIVE
Hep A IgM: NEGATIVE
Hep B C IgM: NEGATIVE
Hepatitis B Surface Ag: NEGATIVE

## 2011-02-28 LAB — URINE CULTURE
Colony Count: 30000
Culture  Setup Time: 201301300143

## 2011-02-28 LAB — HIV ANTIBODY (ROUTINE TESTING W REFLEX): HIV: NONREACTIVE

## 2011-02-28 MED ORDER — CEFDINIR 300 MG PO CAPS: 300.0000 mg | ORAL_CAPSULE | Freq: Two times a day (BID) | ORAL | Status: DC

## 2011-02-28 MED ORDER — OXYCODONE-ACETAMINOPHEN 5-325 MG PO TABS: 2.0000 | ORAL_TABLET | Freq: Four times a day (QID) | ORAL | Status: DC | PRN

## 2011-02-28 NOTE — Discharge Summary (Addendum)
Admit date: 02/26/2011 Discharge date: 02/28/2011  Primary Care Physician:  Loreen Freud, DO, DO   Discharge Diagnoses:   No active problems to display.  Active Hospital Problems  Diagnoses Date Noted     Resolved Hospital Problems  Diagnoses Date Noted Date Resolved  . Fever 02/26/2011 02/28/2011  . Dehydration 02/26/2011 02/28/2011  . Hyponatremia 02/26/2011 02/28/2011  . Acute renal failure 02/26/2011 02/28/2011  . Elevated LFTs 02/26/2011 02/28/2011     DISCHARGE MEDICATION: Medication List  As of 02/28/2011 10:54 AM   STOP taking these medications         cephALEXin 500 MG capsule      sulfamethoxazole-trimethoprim 800-160 MG per tablet         TAKE these medications         cefdinir 300 MG capsule   Commonly known as: OMNICEF   Take 1 capsule (300 mg total) by mouth 2 (two) times daily.      oxyCODONE-acetaminophen 5-325 MG per tablet   Commonly known as: PERCOCET   Take 2 tablets by mouth every 6 (six) hours as needed for pain.            SIGNIFICANT DIAGNOSTIC STUDIES:  Dg Chest 2 View  02/26/2011  *RADIOLOGY REPORT*  Clinical Data:  Headache and vomiting  CHEST - 2 VIEW  Comparison: Chest radiograph 07/16/2007  Findings: Normal mediastinum and cardiac silhouette.  Normal pulmonary  vasculature.  No evidence of effusion, infiltrate, or pneumothorax.  No acute bony abnormality.  IMPRESSION: No acute cardiopulmonary process.  Original Report Authenticated By: Genevive Bi, M.D.   Ct Head Wo Contrast  02/26/2011  *RADIOLOGY REPORT*  Clinical Data: Headache, nausea, vomiting.  CT HEAD WITHOUT CONTRAST  Technique:  Contiguous axial images were obtained from the base of the skull through the vertex without contrast.  Comparison: None.  Findings: No acute intracranial abnormality.  Specifically, no hemorrhage, hydrocephalus, mass lesion, acute infarction, or significant intracranial injury.  No acute calvarial abnormality. Visualized paranasal sinuses and mastoids  clear.  Orbital soft tissues unremarkable.  IMPRESSION: Normal study.  Original Report Authenticated By: Cyndie Chime, M.D.   US Abdomen Complete  02/26/2011  *RADIOLOGY REPORT*  Clinical Data:  Elevated liver function tests.  Nausea and vomiting.  COMPLETE ABDOMINAL ULTRASOUND 02/26/2011:  Comparison:  None.  Findings:  Gallbladder:  No shadowing gallstones or echogenic sludge.  No gallbladder wall thickening or pericholecystic fluid.  Negative sonographic Murphy's sign according to the ultrasound technologist.  Common bile duct:  Normal in caliber with maximum diameter approximating 4 mm.  Liver:  Normal size and echotexture without focal parenchymal abnormality.  Patent portal vein with hepatopetal flow.  IVC:  Patent.  Pancreas:  Normal size and echotexture without focal parenchymal abnormality.  Spleen:  Normal size and echotexture without focal parenchymal abnormality.  Right Kidney:  No hydronephrosis.  Well-preserved cortex.  Normal parenchymal echotexture.  Simple cyst arising from the upper pole measuring approximately 6.6 x 5.0 x 5.4 cm.  No significant focal parenchymal abnormality.  No visible shadowing calculi. Approximately 12.9 cm in length.  Left Kidney:  No hydronephrosis.  Well-preserved cortex.  No shadowing calculi.  Normal size and parenchymal echotexture without focal abnormalities.  Approximately 13.2 cm in length.  Abdominal aorta:  Normal in caliber throughout its visualized course in the abdomen without significant atherosclerosis.  IMPRESSION:  1.  No abnormalities involving the liver, gallbladder, or biliary tree to explain elevated liver function tests. 2.  Approximate 6.6 cm simple cyst arising  from the upper pole of the right kidney.  No significant abnormalities.  Original Report Authenticated By: Arnell Sieving, M.D.    Recent Results (from the past 240 hour(s))  URINE CULTURE     Status: Normal (Preliminary result)   Collection Time   02/26/11  5:16 PM      Component  Value Range Status Comment   Specimen Description URINE, CLEAN CATCH   Final    Special Requests NONE   Final    Culture  Setup Time [id number]   Final    Colony Count 30,000 COLONIES/ML   Final    Culture ESCHERICHIA COLI   Final    Report Status PENDING   Incomplete   CULTURE, BLOOD (ROUTINE X 2)     Status: Normal (Preliminary result)   Collection Time   02/26/11  5:35 PM      Component Value Range Status Comment   Specimen Description BLOOD RIGHT ARM   Final    Special Requests BOTTLES DRAWN AEROBIC AND ANAEROBIC 5VV   Final    Culture  Setup Time [id number]   Final    Culture     Final    Value:        BLOOD CULTURE RECEIVED NO GROWTH TO DATE CULTURE WILL BE HELD FOR 5 DAYS BEFORE ISSUING A FINAL NEGATIVE REPORT   Report Status PENDING   Incomplete   CULTURE, BLOOD (ROUTINE X 2)     Status: Normal (Preliminary result)   Collection Time   02/26/11  5:35 PM      Component Value Range Status Comment   Specimen Description BLOOD RIGHT ARM   Final    Special Requests BOTTLES DRAWN AEROBIC AND ANAEROBIC 5CC   Final    Culture  Setup Time [id number]   Final    Culture     Final    Value:        BLOOD CULTURE RECEIVED NO GROWTH TO DATE CULTURE WILL BE HELD FOR 5 DAYS BEFORE ISSUING A FINAL NEGATIVE REPORT   Report Status PENDING   Incomplete   RAPID STREP SCREEN     Status: Normal   Collection Time   02/27/11  5:28 PM      Component Value Range Status Comment   Streptococcus, Group A Screen (Direct) NEGATIVE  NEGATIVE  Final     BRIEF ADMITTING H & P: 51 year old woman presented to the emergency department today with complaint of headache for the last several days (now resolved), myalgias, nausea, vomiting for 2 days. She did not get the flu vaccine. She has not been able to eat or drink for several days to it was unable to finish her antibiotics secondary to nausea and vomiting. She had leg pain in the left leg a week ago however this has subsequently resolved. She put alcohol on  her leg because it was sore and now has a rash on that side.    No active problems to display.  Active Hospital Problems  Diagnoses Date Noted     Resolved Hospital Problems  Diagnoses Date Noted Date Resolved  . Fever: She was admitted to the hospital under the presumptive diagnoses of influenza started on Tamiflu influenza PCR was sent which was negative. Urine cultures were done which were negative. On further examination she had tonsillar exudate with tender lymphadenopathy and fever with ongoing Tamiflu symptoms. An HIV test was checked which was negative. She was started on Rocephin empirically for possible pharyngitis her fever resolved, she was  able to swallow her lymphadenopathy improve significantly .a strep throat came back negative so this is probably a non-GAS negative pharyngitis. She was changed to 70 her. She will continue this for 5 more days. She was discharged in stable condition.  02/26/2011 02/28/2011  . Dehydration: Did resolve with IV fluids.  02/26/2011 02/28/2011  . Hyponatremia: This probably secondary to decreased by mouth intake does resolve with IV fluids.  02/26/2011 02/28/2011  . Acute renal failure: Does resolve with IV fluids.  02/26/2011 02/28/2011  . Elevated LFTs: There are mildly elevated on admission. An abdominal ultrasound was done that showed no acute abnormalities of the biliary tree or liver. Hepatitis panel was sent which is pending at the time of this dictation. Bilirubin was low. Her hepatitis panel will be followup with her primary care Dr. which she was scheduled appointment with. Have discussed this with the patient extensively.  02/26/2011 02/28/2011    Disposition and Follow-up:  Discharge Orders    Future Appointments: Provider: Department: Dept Phone: Center:   03/12/2011 3:00 PM Lelon Perla, DO [facility] [phone] [facility]    Patient to schedule with PCP as an outpatient with  clinic. She had an appointment which she  never made it.    DISCHARGE EXAM:  General: Alert, awake, oriented x3, in no acute distress.  HEENT: No bruits, no goiter. Improved in lymphadenopathy. tonsilar exudate resolved. Heart: Regular rate and rhythm, without murmurs, rubs, gallops.  Lungs: Crackles left side, bilateral air movement.  Abdomen: Soft, nontender, nondistended, positive bowel sounds.  Neuro: Grossly intact, nonfocal.   Blood pressure 114/74, pulse 67, temperature 97.8 F (36.6 C), temperature source Oral, resp. rate 18, height 5\' 8"  (1.727 m), weight 64.638 kg (142 lb 8 oz), SpO2 100.00%.   Basename 02/27/11 0355 02/26/11 1034  NA 127* 129*  K 4.1 4.1  CL 97 94*  CO2 20 20  GLUCOSE 104* 105*  BUN 7 21  CREATININE 0.57 1.54*  CALCIUM 7.8* 9.0  MG -- --  PHOS -- --    Basename 02/27/11 0355 02/26/11 1034  AST 248* 231*  ALT 174* 168*  ALKPHOS 109 157*  BILITOT 0.1* 0.1*  PROT 6.5 8.5*  ALBUMIN 2.4* 3.2*    Basename 02/26/11 1034  LIPASE 46  AMYLASE --    Basename 02/27/11 0355 02/26/11 1034  WBC 3.4* 5.0  NEUTROABS -- 3.9  HGB 10.4* 13.1  HCT 31.7* 38.1  MCV 85.0 84.3  PLT 218 239    Signed: Marinda Elk M.D. 02/28/2011, 10:54 AM

## 2011-02-28 NOTE — Progress Notes (Signed)
Pt discharged to home with family, RX given to pt, discharge instructions reviewed with pt who verbalized understanding.

## 2011-03-04 NOTE — ED Provider Notes (Signed)
History     CSN: [id number]  Arrival date & time 02/26/11  2130   First MD Initiated Contact with Patient 02/26/11 [phone]      Chief Complaint  Patient presents with  . Rash  . Emesis  . Headache    (Consider location/radiation/quality/duration/timing/severity/associated sxs/prior treatment) HPI The patient is a 51 yo F with no prior history of medical problems who was last seen by myself 10 days ago for a cellulitis.  Patient has had resolution of this episode though she developed nausea, vomiting, fevers, and occasional headache.  Patient did not have any neck pain or stiffness but did endorse some pain with swallowing and congestion.  She has no other complaints but was not able to take her last 3 days of antibiotics for her cellulitis due to nausea and vomiting.  There are no other associated or modifying factors.  History reviewed. No pertinent past medical history.  History reviewed. No pertinent past surgical history.  Family History  Problem Relation Age of Onset  . Diabetes Sister     History  Substance Use Topics  . Smoking status: Current Everyday Smoker  . Smokeless tobacco: Not on file  . Alcohol Use: Yes    OB History    Grav Para Term Preterm Abortions TAB SAB Ect Mult Living                  Review of Systems  Constitutional: Negative.   HENT: Positive for congestion and sore throat.   Eyes: Negative.   Respiratory: Negative.   Cardiovascular: Negative.   Gastrointestinal: Positive for nausea and vomiting.  Genitourinary: Negative.   Musculoskeletal: Negative.   Skin: Negative.   Neurological: Positive for headaches.  Hematological: Negative.   Psychiatric/Behavioral: Negative.   All other systems reviewed and are negative.    Allergies  Review of patient's allergies indicates no known allergies.  Home Medications   Current Outpatient Rx  Name Route Sig Dispense Refill  . CEFDINIR 300 MG PO CAPS Oral Take 1 capsule (300 mg total) by  mouth 2 (two) times daily. 10 capsule 0  . OXYCODONE-ACETAMINOPHEN 5-325 MG PO TABS Oral Take 2 tablets by mouth every 6 (six) hours as needed for pain. 30 tablet 0    BP 114/74  Pulse 67  Temp(Src) 97.8 F (36.6 C) (Oral)  Resp 18  Ht 5\' 8"  (1.727 m)  Wt 142 lb 8 oz (64.638 kg)  BMI 21.67 kg/m2  SpO2 100%  Physical Exam  Nursing note and vitals reviewed. Constitutional: She is oriented to person, place, and time. She appears well-developed and well-nourished. No distress.  HENT:  Head: Normocephalic and atraumatic.  Eyes: Conjunctivae and EOM are normal. Pupils are equal, round, and reactive to light.  Neck: Normal range of motion.  Cardiovascular: Normal rate, regular rhythm, normal heart sounds and intact distal pulses.  Exam reveals no gallop and no friction rub.   No murmur heard. Pulmonary/Chest: Effort normal and breath sounds normal. No respiratory distress. She has no wheezes. She has no rales.  Abdominal: Soft. Bowel sounds are normal. She exhibits no distension. There is no tenderness. There is no rebound and no guarding.  Musculoskeletal: Normal range of motion. She exhibits no edema.  Neurological: She is alert and oriented to person, place, and time. No cranial nerve deficit. She exhibits normal muscle tone. Coordination normal.  Skin: Skin is warm and dry. No rash noted.  Psychiatric: She has a normal mood and affect.    ED  Course  Procedures (including critical care time)  Labs Reviewed  DIFFERENTIAL - Abnormal; Notable for the following:    Neutrophils Relative 78 (*)    All other components within normal limits  COMPREHENSIVE METABOLIC PANEL - Abnormal; Notable for the following:    Sodium 129 (*)    Chloride 94 (*)    Glucose, Bld 105 (*)    Creatinine, Ser 1.54 (*)    Total Protein 8.5 (*)    Albumin 3.2 (*)    AST 231 (*)    ALT 168 (*)    Alkaline Phosphatase 157 (*)    Total Bilirubin 0.1 (*)    GFR calc non Af Amer 39 (*)    GFR calc Af Amer  45 (*)    All other components within normal limits  URINALYSIS, ROUTINE W REFLEX MICROSCOPIC - Abnormal; Notable for the following:    Protein, ur 30 (*)    Leukocytes, UA TRACE (*)    All other components within normal limits  URINE MICROSCOPIC-ADD ON - Abnormal; Notable for the following:    Squamous Epithelial / LPF FEW (*)    Bacteria, UA FEW (*)    All other components within normal limits  COMPREHENSIVE METABOLIC PANEL - Abnormal; Notable for the following:    Sodium 127 (*)    Glucose, Bld 104 (*)    Calcium 7.8 (*)    Albumin 2.4 (*)    AST 248 (*)    ALT 174 (*)    Total Bilirubin 0.1 (*)    All other components within normal limits  CBC - Abnormal; Notable for the following:    WBC 3.4 (*)    RBC 3.73 (*)    Hemoglobin 10.4 (*)    HCT 31.7 (*)    All other components within normal limits  CBC  LIPASE, BLOOD  LACTIC ACID, PLASMA  POCT PREGNANCY, URINE  CULTURE, BLOOD (ROUTINE X 2)  CULTURE, BLOOD (ROUTINE X 2)  URINE CULTURE  PROTIME-INR  INFLUENZA PANEL BY PCR  RAPID STREP SCREEN  HIV ANTIBODY (ROUTINE TESTING)  HEPATITIS PANEL, ACUTE   DG CHEST 2 VIEW   Final Result:      CT HEAD W/O CONTRAST   Final Result:      US ABDOMEN COMPLETE   Final Result:         1. Febrile illness   2. Hyponatremia   3. Nausea and vomiting       MDM  Patient was evaluated and had work-up for her symptoms.  This was remarkable for a negative head CT, no leukocytosis, hyponatremia, and transaminitis.  RUQ [place] was performed and showed no liver pathology.  Patient had been treated symptomatically and was feeling better when she developed a fever to 103.  She had had a negative CXR and unremarkable urinalysis and was actually feeling better.  Tylenol and tamiflu were given and patient was admitted to the hospitalist.  No antibiotics were given as there were no obvious infectious sources and hospitalist agreed with plan.  Patient was admitted in good  condition.        Cyndra Numbers, MD 03/04/11 [phone]

## 2011-03-05 ENCOUNTER — Emergency Department (HOSPITAL_COMMUNITY)
Admission: EM | Admit: 2011-03-05 | Discharge: 2011-03-06 | Disposition: A | Discharge status: Home / Self Care | Payer: PRIVATE HEALTH INSURANCE | Attending: Emergency Medicine | Admitting: Emergency Medicine

## 2011-03-05 ENCOUNTER — Encounter (HOSPITAL_COMMUNITY): Payer: Self-pay | Admitting: Emergency Medicine

## 2011-03-05 DIAGNOSIS — M549 Dorsalgia, unspecified: Secondary | ICD-10-CM | POA: Insufficient documentation

## 2011-03-05 DIAGNOSIS — F17200 Nicotine dependence, unspecified, uncomplicated: Secondary | ICD-10-CM | POA: Insufficient documentation

## 2011-03-05 DIAGNOSIS — R209 Unspecified disturbances of skin sensation: Secondary | ICD-10-CM | POA: Insufficient documentation

## 2011-03-05 DIAGNOSIS — Z79899 Other long term (current) drug therapy: Secondary | ICD-10-CM | POA: Insufficient documentation

## 2011-03-05 LAB — CULTURE, BLOOD (ROUTINE X 2)
Culture  Setup Time: 201301300140
Culture  Setup Time: 201301300141
Culture: NO GROWTH
Culture: NO GROWTH

## 2011-03-05 NOTE — ED Notes (Signed)
Pt complains of back pain since 4am, no injury noted, she states that it radiates through her right shoulder and makes her right fingers numb

## 2011-03-05 NOTE — ED Notes (Signed)
Pt states she is having back pain in her whole back and has pain numbness in her right arm  Denies injury  Pt states the pain woke her up this morning from her sleep  Pt states it felt like a sharp pain  Pt states the pain is intermittent

## 2011-03-06 MED ORDER — DIAZEPAM 5 MG PO TABS: 5.0000 mg | ORAL_TABLET | Freq: Three times a day (TID) | ORAL | Status: DC | PRN

## 2011-03-06 MED ORDER — KETOROLAC TROMETHAMINE 30 MG/ML IJ SOLN
30.0000 mg | Freq: Once | INTRAMUSCULAR | Status: AC
  Administered 2011-03-06: 30 mg via INTRAMUSCULAR
  Filled 2011-03-06: qty 1

## 2011-03-06 MED ORDER — IBUPROFEN 800 MG PO TABS: 800.0000 mg | ORAL_TABLET | Freq: Three times a day (TID) | ORAL | Status: AC | PRN

## 2011-03-06 MED ORDER — DIAZEPAM 5 MG PO TABS
5.0000 mg | ORAL_TABLET | Freq: Three times a day (TID) | ORAL | Status: DC | PRN
Start: 2011-03-06 — End: 2011-03-09

## 2011-03-06 MED ORDER — DIAZEPAM 5 MG PO TABS
5.0000 mg | ORAL_TABLET | Freq: Once | ORAL | Status: AC
  Administered 2011-03-06: 5 mg via ORAL
  Filled 2011-03-06: qty 1

## 2011-03-06 NOTE — ED Provider Notes (Signed)
History     CSN: [id number]  Arrival date & time 03/05/11  2113   First MD Initiated Contact with Patient 03/05/11 2330      Chief Complaint  Patient presents with  . Back Pain    (Consider location/radiation/quality/duration/timing/severity/associated sxs/prior treatment) HPI Comments: Patient reports back pain that woke her from sleep this morning.   Reports pain running along both sides of her back that is exacerbated by movement.  Denies injury, fever, abdominal pain, dysuria/frequency/urgency, change in bowel habits, loss of control of bowel or bladder, weakness or numbness of the legs.  States she is having some tingling in her right hand but denies any neck pain and denies any weakness in her hand.    Patient is a 51 y.o. female presenting with back pain. The history is provided by the patient.  Back Pain  Pertinent negatives include no chest pain, no fever, no numbness, no abdominal pain, no pelvic pain and no weakness.    History reviewed. No pertinent past medical history.  History reviewed. No pertinent past surgical history.  Family History  Problem Relation Age of Onset  . Diabetes Sister     History  Substance Use Topics  . Smoking status: Current Everyday Smoker -- 0.0 packs/day    Types: Cigarettes  . Smokeless tobacco: Not on file  . Alcohol Use: Yes     occassional    OB History    Grav Para Term Preterm Abortions TAB SAB Ect Mult Living                  Review of Systems  Constitutional: Negative for fever and chills.  HENT: Negative for neck pain.   Respiratory: Negative for cough and shortness of breath.   Cardiovascular: Negative for chest pain.  Gastrointestinal: Negative for vomiting, abdominal pain, diarrhea and constipation.  Genitourinary: Negative for urgency, frequency, vaginal discharge, difficulty urinating and pelvic pain.  Musculoskeletal: Positive for back pain.  Neurological: Negative for weakness and numbness.  All other  systems reviewed and are negative.    Allergies  Review of patient's allergies indicates no known allergies.  Home Medications   Current Outpatient Rx  Name Route Sig Dispense Refill  . OXYCODONE-ACETAMINOPHEN 5-325 MG PO TABS Oral Take 2 tablets by mouth every 6 (six) hours as needed for pain. 30 tablet 0  . CEFDINIR 300 MG PO CAPS Oral Take 1 capsule (300 mg total) by mouth 2 (two) times daily. 10 capsule 0    BP 177/91  Pulse 66  Temp(Src) 98.8 F (37.1 C) (Oral)  Resp 20  SpO2 100%  Physical Exam  Nursing note and vitals reviewed. Constitutional: She is oriented to person, place, and time. She appears well-developed and well-nourished.  HENT:  Head: Normocephalic and atraumatic.  Neck: Normal range of motion. Neck supple. No rigidity. Normal range of motion present.  Cardiovascular: Normal rate, regular rhythm and normal heart sounds.   Pulmonary/Chest: Effort normal and breath sounds normal. No respiratory distress. She has no wheezes. She has no rales. She exhibits no tenderness.  Abdominal: Soft. She exhibits no distension and no mass. There is no tenderness. There is no rebound and no guarding.  Musculoskeletal: Normal range of motion. She exhibits no tenderness.       Cervical back: Normal.       Thoracic back: Normal.       Lumbar back: Normal.       Spine is nontender.  Straight leg raise is negative.  Neurological: She is alert and oriented to person, place, and time. She has normal strength. No cranial nerve deficit or sensory deficit. She exhibits normal muscle tone. Coordination normal.       Patient transfers herself from the wheelchair to the stretcher without difficulty.  Strength 5/5 in all extremities, sensation is intact, distal pulses intact. Grip strengths are equal.       ED Course  Procedures (including critical care time)  Labs Reviewed - No data to display No results found.   1. Back pain       MDM  Patient with muscular back pain  that began this morning - no bony tenderness, no weakness or numbness in her legs, no incontinence, no fevers, no injury.  Patient has been spending a lot of time lying down because of recent cellulitis.  Have prescribed muscle relaxant and NSAID with PCP follow up.  Patient has f/u already scheduled for next week.  Discussed precautions for immediate follow up.  Patient verbalizes understanding and agrees with plan.         Dillard Cannon [place], [place] 03/06/11 0110

## 2011-03-06 NOTE — ED Provider Notes (Signed)
Medical screening examination/treatment/procedure(s) were performed by non-physician practitioner and as supervising physician I was immediately available for consultation/collaboration.  Tanette Chauca R. Laruen Risser, MD 03/06/11 0844 

## 2011-03-08 ENCOUNTER — Ambulatory Visit (INDEPENDENT_AMBULATORY_CARE_PROVIDER_SITE_OTHER)
Admission: RE | Admit: 2011-03-08 | Discharge: 2011-03-08 | Disposition: A | Discharge status: Home / Self Care | Payer: PRIVATE HEALTH INSURANCE | Source: Ambulatory Visit | Attending: Family Medicine | Admitting: Family Medicine

## 2011-03-08 ENCOUNTER — Encounter: Payer: Self-pay | Admitting: Family Medicine

## 2011-03-08 ENCOUNTER — Ambulatory Visit (INDEPENDENT_AMBULATORY_CARE_PROVIDER_SITE_OTHER): Payer: PRIVATE HEALTH INSURANCE | Admitting: Family Medicine

## 2011-03-08 ENCOUNTER — Encounter: Payer: Self-pay | Admitting: Gastroenterology

## 2011-03-08 DIAGNOSIS — R109 Unspecified abdominal pain: Secondary | ICD-10-CM

## 2011-03-08 DIAGNOSIS — A048 Other specified bacterial intestinal infections: Secondary | ICD-10-CM

## 2011-03-08 LAB — BASIC METABOLIC PANEL
BUN: 5 mg/dL — ABNORMAL LOW (ref 6–23)
CO2: 25 mEq/L (ref 19–32)
Calcium: 9.3 mg/dL (ref 8.4–10.5)
Chloride: 98 mEq/L (ref 96–112)
Creatinine, Ser: 0.5 mg/dL (ref 0.4–1.2)
GFR: 185.13 mL/min (ref >60.00)
Glucose, Bld: 93 mg/dL (ref 70–99)
Potassium: 3.8 mEq/L (ref 3.5–5.1)
Sodium: 134 mEq/L — ABNORMAL LOW (ref 135–145)

## 2011-03-08 LAB — LIPASE: Lipase: 23 U/L (ref 11.0–59.0)

## 2011-03-08 LAB — CBC WITH DIFFERENTIAL/PLATELET
Basophils Absolute: 0 10*3/uL (ref 0.0–0.1)
Basophils Relative: 0.2 % (ref 0.0–3.0)
Eosinophils Absolute: 0 10*3/uL (ref 0.0–0.7)
Eosinophils Relative: 0.7 % (ref 0.0–5.0)
HCT: 38.8 % (ref 36.0–46.0)
Hemoglobin: 12.9 g/dL (ref 12.0–15.0)
Lymphocytes Relative: 39.4 % (ref 12.0–46.0)
Lymphs Abs: 1.4 10*3/uL (ref 0.7–4.0)
MCHC: 33.1 g/dL (ref 30.0–36.0)
MCV: 88.6 fl (ref 78.0–100.0)
Monocytes Absolute: 0.7 10*3/uL (ref 0.1–1.0)
Monocytes Relative: 18.3 % — ABNORMAL HIGH (ref 3.0–12.0)
Neutro Abs: 1.5 10*3/uL (ref 1.4–7.7)
Neutrophils Relative %: 41.4 % — ABNORMAL LOW (ref 43.0–77.0)
Platelets: 406 10*3/uL — ABNORMAL HIGH (ref 150.0–400.0)
RBC: 4.39 Mil/uL (ref 3.87–5.11)
RDW: 13.2 % (ref 11.5–14.6)
WBC: 3.6 10*3/uL — ABNORMAL LOW (ref 4.5–10.5)

## 2011-03-08 LAB — HEPATIC FUNCTION PANEL
ALT: 29 U/L (ref 0–35)
AST: 24 U/L (ref 0–37)
Albumin: 3.3 g/dL — ABNORMAL LOW (ref 3.5–5.2)
Alkaline Phosphatase: 81 U/L (ref 39–117)
Bilirubin, Direct: 0.1 mg/dL (ref 0.0–0.3)
Total Bilirubin: 0.5 mg/dL (ref 0.3–1.2)
Total Protein: 8.2 g/dL (ref 6.0–8.3)

## 2011-03-08 LAB — H. PYLORI ANTIBODY, IGG: H Pylori IgG: POSITIVE

## 2011-03-08 LAB — AMYLASE: Amylase: 69 U/L (ref 27–131)

## 2011-03-08 MED ORDER — AMOXICILL-CLARITHRO-LANSOPRAZ PO MISC: Freq: Two times a day (BID) | ORAL | Status: DC

## 2011-03-08 MED ORDER — OMEPRAZOLE 20 MG PO CPDR: 20.0000 mg | DELAYED_RELEASE_CAPSULE | Freq: Every day | ORAL | Status: DC

## 2011-03-08 MED ORDER — IOHEXOL 300 MG/ML  SOLN
100.0000 mL | Freq: Once | INTRAMUSCULAR | Status: AC | PRN
  Administered 2011-03-08: 100 mL via INTRAVENOUS

## 2011-03-08 MED ORDER — PROMETHAZINE HCL 50 MG PO TABS: 50.0000 mg | ORAL_TABLET | Freq: Four times a day (QID) | ORAL | Status: AC | PRN

## 2011-03-08 MED ORDER — GI COCKTAIL ~~LOC~~
30.0000 mL | Freq: Once | ORAL | Status: AC
  Administered 2011-03-08: 30 mL via ORAL

## 2011-03-08 MED ORDER — PROMETHAZINE HCL 50 MG/ML IJ SOLN
50.0000 mg | Freq: Once | INTRAMUSCULAR | Status: AC
  Administered 2011-03-08: 50 mg via INTRAMUSCULAR

## 2011-03-08 NOTE — Progress Notes (Signed)
Addended by: Arnette Norris on: 03/08/2011 02:38 PM   Modules accepted: Orders

## 2011-03-08 NOTE — Patient Instructions (Signed)

## 2011-03-08 NOTE — Progress Notes (Signed)
Addended by: Arnette Norris on: 03/08/2011 02:39 PM   Modules accepted: Orders

## 2011-03-08 NOTE — Progress Notes (Signed)
  Subjective:     Samantha [name] is a 51 y.o. female who presents for evaluation of abdominal pain. Onset was several weeks ago. Symptoms have been unchanged. The pain is described as sharp, and is 8/10 in intensity. Pain is located in the epigastric region with radiation to right back.  Aggravating factors: eating.  Alleviating factors: antacids. Associated symptoms: anorexia, headache, nausea and vomiting. The patient denies diarrhea and melena. Pt was in hospital and just d/cd 1/31---she has been weak and unable to eat since.   The patient's history has been marked as reviewed and updated as appropriate.  Review of Systems Pertinent items are noted in HPI.     Objective:    BP 154/94  Pulse 96  Temp(Src) 98.4 F (36.9 C) (Oral)  Ht 5\' 9"  (1.753 m)  Wt 130 lb 12.8 oz (59.33 kg)  BMI 19.32 kg/m2  SpO2 99% General appearance: alert, cooperative and moderate distress Neck: no adenopathy, supple, symmetrical, trachea midline and thyroid not enlarged, symmetric, no tenderness/mass/nodules Lungs: clear to auscultation bilaterally Abdomen: abnormal findings:  distended, hypoactive bowel sounds, rebound tenderness and marked tenderness in the epigastrium Extremities: extremities normal, atraumatic, no cyanosis or edema    Assessment:    Abdominal pain --- R/O ulcer, gerd , mass, pancreatitis .   Plan:  Hospital labs, d/c and imaging reviewed  See orders for lab and imaging studies. Adhere to simple, bland diet. Initiate empiric trial of acid suppression; see orders. Further follow-up plans will be based on outcome of lab/imaging studies; see orders. Follow up as needed.

## 2011-03-09 ENCOUNTER — Emergency Department (HOSPITAL_COMMUNITY): Payer: PRIVATE HEALTH INSURANCE

## 2011-03-09 ENCOUNTER — Other Ambulatory Visit: Payer: Self-pay

## 2011-03-09 ENCOUNTER — Inpatient Hospital Stay (HOSPITAL_COMMUNITY)
Admission: EM | Admit: 2011-03-09 | Discharge: 2011-03-19 | DRG: 075 | Disposition: A | Discharge status: Home Health | Payer: PRIVATE HEALTH INSURANCE | Attending: Internal Medicine | Admitting: Internal Medicine

## 2011-03-09 ENCOUNTER — Encounter (HOSPITAL_COMMUNITY): Payer: Self-pay | Admitting: Emergency Medicine

## 2011-03-09 DIAGNOSIS — IMO0001 Reserved for inherently not codable concepts without codable children: Secondary | ICD-10-CM

## 2011-03-09 DIAGNOSIS — M549 Dorsalgia, unspecified: Secondary | ICD-10-CM

## 2011-03-09 DIAGNOSIS — E871 Hypo-osmolality and hyponatremia: Secondary | ICD-10-CM | POA: Diagnosis present

## 2011-03-09 DIAGNOSIS — A879 Viral meningitis, unspecified: Principal | ICD-10-CM | POA: Diagnosis present

## 2011-03-09 DIAGNOSIS — G35 Multiple sclerosis: Secondary | ICD-10-CM | POA: Diagnosis present

## 2011-03-09 DIAGNOSIS — R112 Nausea with vomiting, unspecified: Secondary | ICD-10-CM | POA: Diagnosis present

## 2011-03-09 DIAGNOSIS — F17200 Nicotine dependence, unspecified, uncomplicated: Secondary | ICD-10-CM | POA: Diagnosis present

## 2011-03-09 DIAGNOSIS — E876 Hypokalemia: Secondary | ICD-10-CM | POA: Diagnosis present

## 2011-03-09 DIAGNOSIS — R1013 Epigastric pain: Secondary | ICD-10-CM | POA: Diagnosis not present

## 2011-03-09 DIAGNOSIS — R519 Headache, unspecified: Secondary | ICD-10-CM | POA: Diagnosis present

## 2011-03-09 DIAGNOSIS — I674 Hypertensive encephalopathy: Secondary | ICD-10-CM | POA: Diagnosis present

## 2011-03-09 DIAGNOSIS — A599 Trichomoniasis, unspecified: Secondary | ICD-10-CM | POA: Diagnosis present

## 2011-03-09 DIAGNOSIS — G589 Mononeuropathy, unspecified: Secondary | ICD-10-CM | POA: Diagnosis present

## 2011-03-09 DIAGNOSIS — R51 Headache: Secondary | ICD-10-CM | POA: Diagnosis present

## 2011-03-09 DIAGNOSIS — M545 Low back pain, unspecified: Secondary | ICD-10-CM | POA: Diagnosis present

## 2011-03-09 DIAGNOSIS — R4182 Altered mental status, unspecified: Secondary | ICD-10-CM | POA: Diagnosis present

## 2011-03-09 DIAGNOSIS — R030 Elevated blood-pressure reading, without diagnosis of hypertension: Secondary | ICD-10-CM | POA: Diagnosis present

## 2011-03-09 DIAGNOSIS — E860 Dehydration: Secondary | ICD-10-CM

## 2011-03-09 DIAGNOSIS — I10 Essential (primary) hypertension: Secondary | ICD-10-CM | POA: Diagnosis present

## 2011-03-09 DIAGNOSIS — R29810 Facial weakness: Secondary | ICD-10-CM | POA: Diagnosis present

## 2011-03-09 LAB — COMPREHENSIVE METABOLIC PANEL
ALT: 26 U/L (ref 0–35)
AST: 23 U/L (ref 0–37)
Albumin: 3.3 g/dL — ABNORMAL LOW (ref 3.5–5.2)
Alkaline Phosphatase: 93 U/L (ref 39–117)
BUN: 7 mg/dL (ref 6–23)
CO2: 24 mEq/L (ref 19–32)
Calcium: 9.3 mg/dL (ref 8.4–10.5)
Chloride: 86 mEq/L — ABNORMAL LOW (ref 96–112)
Creatinine, Ser: 0.36 mg/dL — ABNORMAL LOW (ref 0.50–1.10)
GFR calc Af Amer: >90 mL/min (ref >90)
GFR calc non Af Amer: >90 mL/min (ref >90)
Glucose, Bld: 121 mg/dL — ABNORMAL HIGH (ref 70–99)
Potassium: 3.2 mEq/L — ABNORMAL LOW (ref 3.5–5.1)
Sodium: 125 mEq/L — ABNORMAL LOW (ref 135–145)
Total Bilirubin: 0.4 mg/dL (ref 0.3–1.2)
Total Protein: 8.3 g/dL (ref 6.0–8.3)

## 2011-03-09 LAB — URINE MICROSCOPIC-ADD ON

## 2011-03-09 LAB — RAPID URINE DRUG SCREEN, HOSP PERFORMED
Amphetamines: NOT DETECTED
Barbiturates: POSITIVE — AB
Benzodiazepines: POSITIVE — AB
Cocaine: NOT DETECTED
Opiates: NOT DETECTED
Tetrahydrocannabinol: NOT DETECTED

## 2011-03-09 LAB — URINALYSIS, ROUTINE W REFLEX MICROSCOPIC
Bilirubin Urine: NEGATIVE
Glucose, UA: NEGATIVE mg/dL
Hgb urine dipstick: NEGATIVE
Ketones, ur: 40 mg/dL — AB
Leukocytes, UA: NEGATIVE
Nitrite: NEGATIVE
Protein, ur: 30 mg/dL — AB
Specific Gravity, Urine: 1.015 (ref 1.005–1.030)
Urobilinogen, UA: 2 mg/dL — ABNORMAL HIGH (ref 0.0–1.0)
pH: 7 (ref 5.0–8.0)

## 2011-03-09 LAB — POCT I-STAT, CHEM 8
BUN: 5 mg/dL — ABNORMAL LOW (ref 6–23)
Calcium, Ion: 1.1 mmol/L — ABNORMAL LOW (ref 1.12–1.32)
Chloride: 90 mEq/L — ABNORMAL LOW (ref 96–112)
Creatinine, Ser: 0.5 mg/dL (ref 0.50–1.10)
Glucose, Bld: 122 mg/dL — ABNORMAL HIGH (ref 70–99)
HCT: 44 % (ref 36.0–46.0)
Hemoglobin: 15 g/dL (ref 12.0–15.0)
Potassium: 3 mEq/L — ABNORMAL LOW (ref 3.5–5.1)
Sodium: 126 mEq/L — ABNORMAL LOW (ref 135–145)
TCO2: 25 mmol/L (ref 0–100)

## 2011-03-09 LAB — DIFFERENTIAL
Basophils Absolute: 0 10*3/uL (ref 0.0–0.1)
Basophils Relative: 0 % (ref 0–1)
Eosinophils Absolute: 0 10*3/uL (ref 0.0–0.7)
Eosinophils Relative: 0 % (ref 0–5)
Lymphocytes Relative: 16 % (ref 12–46)
Lymphs Abs: 1 10*3/uL (ref 0.7–4.0)
Monocytes Absolute: 0.8 10*3/uL (ref 0.1–1.0)
Monocytes Relative: 13 % — ABNORMAL HIGH (ref 3–12)
Neutro Abs: 4.4 10*3/uL (ref 1.7–7.7)
Neutrophils Relative %: 71 % (ref 43–77)

## 2011-03-09 LAB — GLUCOSE, CAPILLARY: Glucose-Capillary: 127 mg/dL — ABNORMAL HIGH (ref 70–99)

## 2011-03-09 LAB — CBC
HCT: 36.1 % (ref 36.0–46.0)
Hemoglobin: 12.4 g/dL (ref 12.0–15.0)
MCH: 28.8 pg (ref 26.0–34.0)
MCHC: 34.3 g/dL (ref 30.0–36.0)
MCV: 83.8 fL (ref 78.0–100.0)
Platelets: 395 10*3/uL (ref 150–400)
RBC: 4.31 MIL/uL (ref 3.87–5.11)
RDW: 12.6 % (ref 11.5–15.5)
WBC: 6.2 10*3/uL (ref 4.0–10.5)

## 2011-03-09 LAB — AMMONIA: Ammonia: 38 umol/L (ref 11–60)

## 2011-03-09 MED ORDER — SODIUM CHLORIDE 0.9 % IV SOLN: INTRAVENOUS | Status: DC

## 2011-03-09 MED ORDER — ONDANSETRON HCL 4 MG PO TABS: 4.0000 mg | ORAL_TABLET | Freq: Four times a day (QID) | ORAL | Status: DC | PRN

## 2011-03-09 MED ORDER — AMOXICILL-CLARITHRO-LANSOPRAZ PO MISC: Freq: Two times a day (BID) | ORAL | Status: DC

## 2011-03-09 MED ORDER — NALOXONE HCL 1 MG/ML IJ SOLN
1.0000 mg | Freq: Once | INTRAMUSCULAR | Status: AC
  Administered 2011-03-09: 1 mg via INTRAVENOUS

## 2011-03-09 MED ORDER — HYDRALAZINE HCL 20 MG/ML IJ SOLN
10.0000 mg | Freq: Four times a day (QID) | INTRAMUSCULAR | Status: DC | PRN
  Administered 2011-03-09 – 2011-03-14 (×7): 10 mg via INTRAVENOUS
  Filled 2011-03-09 (×7): qty 1

## 2011-03-09 MED ORDER — ENOXAPARIN SODIUM 40 MG/0.4ML ~~LOC~~ SOLN: 40.0000 mg | SUBCUTANEOUS | Status: DC

## 2011-03-09 MED ORDER — DEXTROSE 5 % IV SOLN
10.0000 mg/kg | Freq: Three times a day (TID) | INTRAVENOUS | Status: DC
  Administered 2011-03-09 – 2011-03-10 (×2): 600 mg via INTRAVENOUS
  Filled 2011-03-09 (×5): qty 12

## 2011-03-09 MED ORDER — ONDANSETRON HCL 4 MG/2ML IJ SOLN
4.0000 mg | Freq: Once | INTRAMUSCULAR | Status: AC
  Administered 2011-03-09: 4 mg via INTRAVENOUS
  Filled 2011-03-09: qty 2

## 2011-03-09 MED ORDER — ACETAMINOPHEN 650 MG RE SUPP: 650.0000 mg | Freq: Four times a day (QID) | RECTAL | Status: DC | PRN

## 2011-03-09 MED ORDER — SODIUM CHLORIDE 0.9 % IV BOLUS (SEPSIS)
500.0000 mL | Freq: Once | INTRAVENOUS | Status: AC
  Administered 2011-03-09: 500 mL via INTRAVENOUS

## 2011-03-09 MED ORDER — DEXAMETHASONE SODIUM PHOSPHATE 4 MG/ML IJ SOLN
4.0000 mg | Freq: Four times a day (QID) | INTRAMUSCULAR | Status: DC
  Administered 2011-03-10 (×2): 4 mg via INTRAVENOUS
  Filled 2011-03-09 (×6): qty 1

## 2011-03-09 MED ORDER — ACETAMINOPHEN 325 MG PO TABS
650.0000 mg | ORAL_TABLET | Freq: Four times a day (QID) | ORAL | Status: DC | PRN
  Administered 2011-03-13 (×2): 650 mg via ORAL
  Filled 2011-03-09 (×3): qty 2

## 2011-03-09 MED ORDER — OXYCODONE HCL 5 MG PO TABS
5.0000 mg | ORAL_TABLET | Freq: Four times a day (QID) | ORAL | Status: DC | PRN
  Administered 2011-03-10 – 2011-03-17 (×21): 5 mg via ORAL
  Filled 2011-03-09 (×21): qty 1

## 2011-03-09 MED ORDER — DEXTROSE 5 % IV SOLN
2.0000 g | Freq: Two times a day (BID) | INTRAVENOUS | Status: DC
  Administered 2011-03-09 – 2011-03-11 (×4): 2 g via INTRAVENOUS
  Filled 2011-03-09 (×6): qty 2

## 2011-03-09 MED ORDER — POTASSIUM CHLORIDE IN NACL 20-0.9 MEQ/L-% IV SOLN
INTRAVENOUS | Status: DC
  Administered 2011-03-10 (×2): via INTRAVENOUS
  Administered 2011-03-18: 20 mL/h via INTRAVENOUS
  Filled 2011-03-09 (×10): qty 1000

## 2011-03-09 MED ORDER — VANCOMYCIN HCL 1000 MG IV SOLR
750.0000 mg | Freq: Three times a day (TID) | INTRAVENOUS | Status: DC
  Administered 2011-03-09 – 2011-03-10 (×2): 750 mg via INTRAVENOUS
  Filled 2011-03-09 (×5): qty 750

## 2011-03-09 MED ORDER — NALOXONE HCL 1 MG/ML IJ SOLN
INTRAMUSCULAR | Status: AC
  Filled 2011-03-09: qty 2

## 2011-03-09 MED ORDER — SODIUM CHLORIDE 0.9 % IJ SOLN
3.0000 mL | Freq: Two times a day (BID) | INTRAMUSCULAR | Status: DC
  Administered 2011-03-11: 3 mL via INTRAVENOUS

## 2011-03-09 MED ORDER — ONDANSETRON HCL 4 MG/2ML IJ SOLN
4.0000 mg | Freq: Four times a day (QID) | INTRAMUSCULAR | Status: DC | PRN
  Administered 2011-03-10 – 2011-03-14 (×4): 4 mg via INTRAVENOUS
  Filled 2011-03-09 (×4): qty 2

## 2011-03-09 MED ORDER — LIDOCAINE HCL 1 % IJ SOLN
INTRAMUSCULAR | Status: AC
  Administered 2011-03-09: 20:00:00
  Filled 2011-03-09: qty 20

## 2011-03-09 NOTE — Consult Note (Signed)
[facility] CONSULT NOTE     Reason for Consult: Headache of unclear etiology   CC: Headache and nausea, and equivocal left side weakness    HPI:    Samantha [name] is an 51 y.o. female who was recently hospitalized for headache, return to the emergency room today with headache involving the top of her head as well as nausea but no vomiting. She's been some stiffness of her neck as well. She's had no fever or chills. Equivocal weakness of the left side of her face was noted. She's had no change in speech. She has not experienced any change in strength of her extremities. CT scan of her head was obtained which was unremarkable. She's been taking ibuprofen for pain. She's also been on antibiotic therapy with PREVPAC.  Past medical history: Unremarkable with no history of systemic medical illness. Workup for headache during hospitalization 2 weeks ago was unremarkable.   Family History  Problem Relation Age of Onset  . Diabetes Sister     Social History:  reports that she has been smoking Cigarettes.  She has been smoking about 0 packs per day. She does not have any smokeless tobacco history on file. She reports that she drinks alcohol. She reports that she does not use illicit drugs.  No Known Allergies  Medications:    Prior to Admission medications   Medication  Sig  Start Date  End Date  Taking?  Authorizing Provider   amoxicillin-clarithromycin-lansoprazole [facility]) combo pack  Take by mouth 2 (two) times daily. Follow package direction and take x's 2 weeks---Hold the Omeprazole  03/08/11  04/07/11  Yes  Yvonne R Lowne, DO   ibuprofen (ADVIL,MOTRIN) 800 MG tablet  Take 1 tablet (800 mg total) by mouth every 8 (eight) hours as needed for pain.  03/06/11  03/16/11  Yes  Rise Patience, PA   promethazine (PHENERGAN) 50 MG tablet  Take 1 tablet (50 mg total) by mouth every 6 (six) hours as needed for nausea.  03/08/11  03/15/11  Yes  Yvonne R Lowne, DO         Blood pressure 187/99, pulse 88, temperature 97.9 F (36.6 C), temperature source Oral, resp. rate 16, SpO2 97.00%.   Neurologic Examination:  Mental Status: Alert, and oriented to place. Patient indicated she did not know the correct month..  Speech fluent without evidence of aphasia. Able to follow commands without difficulty. Cranial Nerves: II-Visual fields normal t. III/IV/VI-Extraocular movements were full and conjugate.  Pupils reacted normally to light. V/VII-no facial numbness and no facial weakness; mild asymmetry of lower face was noted, but was no different from her usual appearance according to family members. VIII-grossly intact X-normal speech XII-midline tongue extension Motor: 5/5 bilaterally with normal tone and bulk. No weakness was noted involving left upper extremity including no pronator drift. Sensory:  light touch intact throughout, bilaterally Deep Tendon Reflexes: Hypoactive and symmetric throughout Plantars: Downgoing bilaterally Cerebellar: Normal finger-to-nose.  Results for orders placed during the hospital encounter of 03/09/11 (from the past 48 hour(s))  CBC     Status: Normal   Collection Time   03/09/11  2:40 PM      Component Value Range Comment   WBC 6.2  4.0 - 10.5 (K/uL)    RBC 4.31  3.87 - 5.11 (MIL/uL)    Hemoglobin 12.4  12.0 - 15.0 (g/dL)  HCT 36.1  36.0 - 46.0 (%)    MCV 83.8  78.0 - 100.0 (fL)    MCH 28.8  26.0 - 34.0 (pg)    MCHC 34.3  30.0 - 36.0 (g/dL)    RDW 16.1  09.6 - 04.5 (%)    Platelets 395  150 - 400 (K/uL)   DIFFERENTIAL     Status: Abnormal   Collection Time   03/09/11  2:40 PM      Component Value Range Comment   Neutrophils Relative 71  43 - 77 (%)    Neutro Abs 4.4  1.7 - 7.7 (K/uL)    Lymphocytes Relative 16  12 - 46 (%)    Lymphs Abs 1.0  0.7 - 4.0 (K/uL)    Monocytes Relative 13 (*) 3 - 12 (%)    Monocytes Absolute 0.8  0.1 - 1.0 (K/uL)    Eosinophils Relative 0  0 - 5 (%)    Eosinophils Absolute 0.0   0.0 - 0.7 (K/uL)    Basophils Relative 0  0 - 1 (%)    Basophils Absolute 0.0  0.0 - 0.1 (K/uL)   COMPREHENSIVE METABOLIC PANEL     Status: Abnormal   Collection Time   03/09/11  2:40 PM      Component Value Range Comment   Sodium 125 (*) 135 - 145 (mEq/L)    Potassium 3.2 (*) 3.5 - 5.1 (mEq/L)    Chloride 86 (*) 96 - 112 (mEq/L)    CO2 24  19 - 32 (mEq/L)    Glucose, Bld 121 (*) 70 - 99 (mg/dL)    BUN 7  6 - 23 (mg/dL)    Creatinine, Ser 4.09 (*) 0.50 - 1.10 (mg/dL)    Calcium 9.3  8.4 - 10.5 (mg/dL)    Total Protein 8.3  6.0 - 8.3 (g/dL)    Albumin 3.3 (*) 3.5 - 5.2 (g/dL)    AST 23  0 - 37 (U/L)    ALT 26  0 - 35 (U/L)    Alkaline Phosphatase 93  39 - 117 (U/L)    Total Bilirubin 0.4  0.3 - 1.2 (mg/dL)    GFR calc non Af Amer >90  >90 (mL/min)    GFR calc Af Amer >90  >90 (mL/min)   AMMONIA     Status: Normal   Collection Time   03/09/11  3:05 PM      Component Value Range Comment   Ammonia 38  11 - 60 (umol/L)   POCT I-STAT, CHEM 8     Status: Abnormal   Collection Time   03/09/11  3:12 PM      Component Value Range Comment   Sodium 126 (*) 135 - 145 (mEq/L)    Potassium 3.0 (*) 3.5 - 5.1 (mEq/L)    Chloride 90 (*) 96 - 112 (mEq/L)    BUN 5 (*) 6 - 23 (mg/dL)    Creatinine, Ser 8.11  0.50 - 1.10 (mg/dL)    Glucose, Bld 914 (*) 70 - 99 (mg/dL)    Calcium, Ion 7.82 (*) 1.12 - 1.32 (mmol/L)    TCO2 25  0 - 100 (mmol/L)    Hemoglobin 15.0  12.0 - 15.0 (g/dL)    HCT 95.6  21.3 - 08.6 (%)   URINALYSIS, ROUTINE W REFLEX MICROSCOPIC     Status: Abnormal   Collection Time   03/09/11  4:43 PM      Component Value Range Comment   Color, Urine YELLOW  YELLOW     APPearance CLEAR  CLEAR     Specific Gravity, Urine 1.015  1.005 - 1.030     pH 7.0  5.0 - 8.0     Glucose, UA NEGATIVE  NEGATIVE (mg/dL)    Hgb urine dipstick NEGATIVE  NEGATIVE     Bilirubin Urine NEGATIVE  NEGATIVE     Ketones, ur 40 (*) NEGATIVE (mg/dL)    Protein, ur 30 (*) NEGATIVE (mg/dL)    Urobilinogen, UA 2.0  (*) 0.0 - 1.0 (mg/dL)    Nitrite NEGATIVE  NEGATIVE     Leukocytes, UA NEGATIVE  NEGATIVE    URINE RAPID DRUG SCREEN (HOSP PERFORMED)     Status: Abnormal   Collection Time   03/09/11  4:43 PM      Component Value Range Comment   Opiates NONE DETECTED  NONE DETECTED     Cocaine NONE DETECTED  NONE DETECTED     Benzodiazepines POSITIVE (*) NONE DETECTED     Amphetamines NONE DETECTED  NONE DETECTED     Tetrahydrocannabinol NONE DETECTED  NONE DETECTED     Barbiturates POSITIVE (*) NONE DETECTED    URINE MICROSCOPIC-ADD ON     Status: Abnormal   Collection Time   03/09/11  4:43 PM      Component Value Range Comment   Squamous Epithelial / LPF FEW (*) RARE     WBC, UA 0-2  <3 (WBC/hpf)    RBC / HPF 0-2  <3 (RBC/hpf)    Bacteria, UA RARE  RARE     Urine-Other RARE YEAST   TRICHOMONAS PRESENT    Assessment/Plan:  1. No clinical signs of acute stroke. 2. Headache and resistance to neck flexion are suggestive of possible aseptic meningitis. 3. No evidence of intracranial hemorrhage per CT scan.  Recommendations:  1. Agree with obtaining MRI study of the brain since etiology for headache is unclear. 2. Would also recommend lumbar puncture to rule out aseptic meningitis. 3. Analgesic as needed for management of headache pain, including scheduled medications if necessary. 4. We'll also add a muscle relaxant such as Robaxin 500 mg every 6 hours when necessary headache or neck pain.  Venetia Maxon M.D. [facility] [phone]  03/09/2011, 6:32 PM

## 2011-03-09 NOTE — ED Notes (Signed)
Pt's family states pt has been having body aches, abd pain, and headache for 2 weeks and continues to have pain.  Pt's family states she is having trouble sleeping.

## 2011-03-09 NOTE — ED Provider Notes (Signed)
History     CSN: [id number]  Arrival date & time 03/09/11  1324   First MD Initiated Contact with Patient 03/09/11 1421      Chief Complaint  Patient presents with  . Generalized Body Aches   Level V caveat due to altered mental status (Consider location/radiation/quality/duration/timing/severity/associated sxs/prior treatment) HPI Patient was brought in with abdominal pain and headaches with altered mental status. She has been in and her is a feeling bad for least 2 weeks. She was recently in patient with a fever. he was recently diagnosed with H. pylori. She's reportedly had nausea and vomiting his been able to keep her pills down. No trauma.   History reviewed. No pertinent past medical history.  History reviewed. No pertinent past surgical history.  Family History  Problem Relation Age of Onset  . Diabetes Sister     History  Substance Use Topics  . Smoking status: Current Everyday Smoker -- 0.0 packs/day    Types: Cigarettes  . Smokeless tobacco: Not on file  . Alcohol Use: Yes     occassional    OB History    Grav Para Term Preterm Abortions TAB SAB Ect Mult Living                  Review of Systems  HENT: Negative for neck stiffness.   Gastrointestinal: Positive for nausea, vomiting and abdominal pain.  Neurological: Negative for numbness and headaches.    Allergies  Review of patient's allergies indicates no known allergies.  Home Medications   Current Outpatient Rx  Name Route Sig Dispense Refill  . AMOXICILL-CLARITHRO-LANSOPRAZ PO MISC Oral Take by mouth 2 (two) times daily. Follow package direction and take x's 2 weeks---Hold the Omeprazole 1 kit 0  . IBUPROFEN 800 MG PO TABS Oral Take 1 tablet (800 mg total) by mouth every 8 (eight) hours as needed for pain. 21 tablet 0  . PROMETHAZINE HCL 50 MG PO TABS Oral Take 1 tablet (50 mg total) by mouth every 6 (six) hours as needed for nausea. 60 tablet 0    BP 191/100  Pulse 75  Temp(Src) 97.5 F  (36.4 C) (Oral)  Resp 14  SpO2 100%  Physical Exam  Nursing note and vitals reviewed. Constitutional: She appears well-developed and well-nourished.  HENT:  Head: Normocephalic and atraumatic.  Eyes: EOM are normal. Pupils are equal, round, and reactive to light.  Neck: Normal range of motion. Neck supple.       No meningeal signs  Cardiovascular: Normal rate, regular rhythm and normal heart sounds.   No murmur heard. Pulmonary/Chest: Effort normal and breath sounds normal. No respiratory distress. She has no wheezes. She has no rales.  Abdominal: Soft. Bowel sounds are normal. She exhibits no distension. There is no tenderness. There is no rebound and no guarding.  Musculoskeletal: Normal range of motion.  Neurological: She is alert. No cranial nerve deficit.       Patient is sleepy. She has a possible left facial droop, although family states this is where her face always is. Left upper lower extremities seem as if they may be weaker than the right. She's able to identify her mother, but cannot identify the other family member with her.  Skin: Skin is warm and dry.  Psychiatric: She has a normal mood and affect. Her speech is normal.    ED Course  Procedures (including critical care time)  Labs Reviewed  DIFFERENTIAL - Abnormal; Notable for the following:    Monocytes Relative  13 (*)    All other components within normal limits  COMPREHENSIVE METABOLIC PANEL - Abnormal; Notable for the following:    Sodium 125 (*)    Potassium 3.2 (*)    Chloride 86 (*)    Glucose, Bld 121 (*)    Creatinine, Ser 0.36 (*)    Albumin 3.3 (*)    All other components within normal limits  POCT I-STAT, CHEM 8 - Abnormal; Notable for the following:    Sodium 126 (*)    Potassium 3.0 (*)    Chloride 90 (*)    BUN 5 (*)    Glucose, Bld 122 (*)    Calcium, Ion 1.10 (*)    All other components within normal limits  CBC  AMMONIA  URINALYSIS, ROUTINE W REFLEX MICROSCOPIC  URINE CULTURE    CULTURE, BLOOD (ROUTINE X 2)  CULTURE, BLOOD (ROUTINE X 2)  URINE RAPID DRUG SCREEN (HOSP PERFORMED)   Ct Head Wo Contrast  03/09/2011  *RADIOLOGY REPORT*  Clinical Data: Altered mental status, frontal headache  CT HEAD WITHOUT CONTRAST  Technique:  Contiguous axial images were obtained from the base of the skull through the vertex without contrast.  Comparison: 02/26/2011  Findings: No skull fracture is noted.  Paranasal sinuses and mastoid air cells are unremarkable.  No intracranial hemorrhage, mass effect or midline shift.  No intra or extra-axial fluid collection.  Ventricular size is stable from prior exam.  No acute infarction.  No mass lesion is noted on this unenhanced scan.  IMPRESSION: No acute intracranial abnormality.  No significant change.  Original Report Authenticated By: Natasha Mead, M.D.   Ct Abdomen Pelvis W Contrast  03/08/2011  *RADIOLOGY REPORT*  Clinical Data: Abdominal pain  CT ABDOMEN AND PELVIS WITH CONTRAST  Technique:  Multidetector CT imaging of the abdomen and pelvis was performed following the standard protocol during bolus administration of intravenous contrast.  Contrast: OMNIPAQUE IOHEXOL 300 MG/ML IV SOLN  Comparison: None  Findings: The lung bases appear clear.  The spleen is normal.  There are no focal liver abnormalities identified.  Gallbladder negative.  No biliary dilatation.  Normal appearance of the common bile duct.  The pancreas is unremarkable.  The adrenal glands are both normal.  There is a large simple appearing cyst within the right kidney measuring 5.3 cm. Small hypodensity within the upper pole of the lumbar left kidney is too small to characterize measuring 6 mm, image 16.  No upper abdominal adenopathy.  There is no pelvic or inguinal adenopathy.  Urinary bladder appears normal.  There is a large fibroid arising from the uterine fundus measuring approximately 3.9 cm.  The stomach and the small bowel loops are unremarkable.  No evidence for bowel  obstruction.  The appendix is identified and appears normal.  The colon is negative.  Review of the visualized osseous structures is significant for mild scoliosis and multilevel spondylosis.  There is a first-degree anterolisthesis of L4 on L5.  IMPRESSION:  1.  No acute findings within the abdomen or pelvis. 2.  The appendix is identified and appears normal.  Original Report Authenticated By: Rosealee Albee, M.D.   Dg Chest Port 1 View  03/09/2011  *RADIOLOGY REPORT*  Clinical Data: Body ache, headache and altered mental status.  PORTABLE CHEST - 1 VIEW  Comparison: 02/26/2011  Findings: No pulmonary infiltrate, edema or pleural fluid identified.  Heart size is normal.  Stable scoliosis of the thoracic spine.  IMPRESSION: No active disease.  Original Report  Authenticated By: Reola Calkins, M.D.     1. Altered mental status   2. Hyponatremia      Date: 03/09/2011  Rate: 72  Rhythm: normal sinus rhythm  QRS Axis: normal  Intervals: normal  ST/T Wave abnormalities: normal  Conduction Disutrbances:none  Narrative Interpretation:   Old EKG Reviewed: unchanged    MDM   altered mental status with headache. No fever. Recently treated for UTI. Possible left-sided weakness. Initial head CT is normal. Headache has been going for a couple weeks. She is not febrile does not have an elevated white count. She has a hyponatremia, but not to a level where I consider altered mental status. She'll be admitted to medicine. I think that meningitis is less likely at this time.   Juliet Rude. Rubin Payor, MD 03/09/11 [phone]

## 2011-03-09 NOTE — Progress Notes (Signed)
[facility] hospitalist progress note. Chief complaint. Headache. History of present illness. This 51 year old female admitted earlier with complaints of intermittent frontal headache 10/10. Patient was seen by neurology consult Dr. Roseanne Reno who felt that a septic meningitis was in the differential. He suggested a lumbar puncture prior to starting antibiotics but unfortunately this was not able to be obtained in the emergency room due to the patient's spinal anatomy. I did attempt to arrange lumbar puncture this p.m. but unfortunately I was unable to arrange this. I did discuss this with Dr. Thad Ranger now on-call for neurology. Dr. Thad Ranger of reviewed the case and recommended that we initiate the patient's IV antibiotics now and arrange for interventional radiology to obtain a lumbar puncture in the early a.m. Vital signs. Temperature 97.9, pulse 91, respiration 18, blood pressure 192/110. General appearance. Thin frail middle-aged female in no distress. She appears alert and cooperative. Cardiac. Rate and rhythm regular. No jugular venous distention. No edema. Lungs. Breath sounds clear and equal. O2 sats are stable. Abdomen. Soft with positive bowel sounds. Mild diffuse pain. Neurologic. I was able to flex and extend the neck with no significant rigidity noted at this time. Patient is alert and at baseline per family. I do not find any unilateral or focal defects. Impression/plan. Problem #1 rule out aseptic meningitis. As per Dr. Thad Ranger recommendations I have the requested the pharmacy and nursing start antibiotics at this time. I have ordered interventional radiology lumbar puncture for the a.m. We'll follow for any changes such as fever or decreased level of consciousness. Problem #2 hypertension. I will provide when necessary hydralazine every 6 hours as needed dosing 10 mg IV.

## 2011-03-09 NOTE — Progress Notes (Addendum)
ANTIBIOTIC CONSULT NOTE - INITIAL  Pharmacy Consult for Acyclovir and Vancomycin Indication: r/o meningitis  No Known Allergies  Patient Measurements: Height: 5\' 9"  (175.3 cm) Weight: 152 lb (68.947 kg) IBW/kg (Calculated) : 66.2   Vital Signs: Temp: 97.9 F (36.6 C) (02/09 1800) Temp src: Oral (02/09 1430) BP: 187/99 mmHg (02/09 1658) Pulse Rate: 88  (02/09 1658)  Labs:  Basename 03/09/11 1512 03/09/11 1440 03/08/11 1132  WBC -- 6.2 3.6*  HGB 15.0 12.4 12.9  PLT -- 395 406.0*  LABCREA -- -- --  CREATININE 0.50 0.36* 0.5   Estimated Creatinine Clearance: 88.9 ml/min (by C-G formula based on Cr of 0.5).  Microbiology: Recent Results (from the past 720 hour(s))  URINE CULTURE     Status: Normal   Collection Time   02/26/11  5:16 PM      Component Value Range Status Comment   Specimen Description URINE, CLEAN CATCH   Final    Special Requests NONE   Final    Culture  Setup Time [id number]   Final    Colony Count 30,000 COLONIES/ML   Final    Culture ESCHERICHIA COLI   Final    Report Status 02/28/2011 FINAL   Final    Organism ID, Bacteria ESCHERICHIA COLI   Final   CULTURE, BLOOD (ROUTINE X 2)     Status: Normal   Collection Time   02/26/11  5:35 PM      Component Value Range Status Comment   Specimen Description BLOOD RIGHT ARM   Final    Special Requests BOTTLES DRAWN AEROBIC AND ANAEROBIC 5VV   Final    Culture  Setup Time [id number]   Final    Culture NO GROWTH 5 DAYS   Final    Report Status 03/05/2011 FINAL   Final   CULTURE, BLOOD (ROUTINE X 2)     Status: Normal   Collection Time   02/26/11  5:35 PM      Component Value Range Status Comment   Specimen Description BLOOD RIGHT ARM   Final    Special Requests BOTTLES DRAWN AEROBIC AND ANAEROBIC 5CC   Final    Culture  Setup Time [id number]   Final    Culture NO GROWTH 5 DAYS   Final    Report Status 03/05/2011 FINAL   Final   RAPID STREP SCREEN     Status: Normal   Collection Time   02/27/11  5:28  PM      Component Value Range Status Comment   Streptococcus, Group A Screen (Direct) NEGATIVE  NEGATIVE  Final     Medical History: History reviewed. No pertinent past medical history.  Medications:  Anti-infectives     Start     Dose/Rate Route Frequency Ordered Stop   03/09/11 2200   cefTRIAXone (ROCEPHIN) 2 g in dextrose 5 % 50 mL IVPB        2 g 100 mL/hr over 30 Minutes Intravenous Every 12 hours 03/09/11 2053     03/09/11 2100   acyclovir (ZOVIRAX) 600 mg in dextrose 5 % 100 mL IVPB        10 mg/kg  60 kg (Order-Specific) 112 mL/hr over 60 Minutes Intravenous 3 times per day 03/09/11 1939           Assessment:  51 yo F admit with possible meningitis  CT shows no acute intracranial abnormality  LP ordered  Renal function appears normal with CrCl ~89  Ceftriaxone ordered per MD dosing  Plan:   Acyclovir 600mg  IV Q8 hours  Vancomycin 750 mg IV Q 8 hours  Measure antibiotic drug levels at steady state  Follow up culture results, renal function, and labs as available    Lynann Beaver PharmD  Pager [phone] 03/09/2011 9:02 PM

## 2011-03-09 NOTE — Plan of Care (Signed)
Problem: Problem: Mobility Progression Goal: ABLE TO AMBULATE INDEPENDENTLY Outcome: Not Met (add Reason) bedrest

## 2011-03-09 NOTE — H&P (Addendum)
PCP:  Loreen Freud, DO, DO   DOA:  03/09/2011  1:25 PM  Chief Complaint:  Headache  HPI: This is a 51 years old African American woman who was recently hospitalized after she presented with headaches and body aches she was treated for pharyngitis and UTI and was discharged on January 31. Patient was brought him back to the ER today with chief complaint of  intermittent frontal headache 10/10 associated with nausea, vomiting, abdominal pain and poor appetite improved with Goody powder use. As the family member she was awake and alert and had normal mental status state at home however when she came to the ER she seems to be getting confused. Patient and family denies any fever or chills, no neck pain or stiffness or blurring of vision . As per family member was having lower back pain yesterday which is results today. She denies any numbness but she generally feels weak without focal weakness. She denies any difficulty swallowing or throat pain. She does have chronic bilateral swelling around the parotids area with no pain or change in size . In the ER CT head showed no acute abnormalities, labs showed hyponatremia and hypokalemia.Patient given a dose of narcan ,zofran and IVF .We are  Asked to admit for further management . Allergies: No Known Allergies  Prior to Admission medications   Medication Sig Start Date End Date Taking? Authorizing Provider  amoxicillin-clarithromycin-lansoprazole [facility]) combo pack Take by mouth 2 (two) times daily. Follow package direction and take x's 2 weeks---Hold the Omeprazole 03/08/11 04/07/11 Yes Yvonne R Lowne, DO  ibuprofen (ADVIL,MOTRIN) 800 MG tablet Take 1 tablet (800 mg total) by mouth every 8 (eight) hours as needed for pain. 03/06/11 03/16/11 Yes Rise Patience, PA  promethazine (PHENERGAN) 50 MG tablet Take 1 tablet (50 mg total) by mouth every 6 (six) hours as needed for nausea. 03/08/11 03/15/11 Yes Lelon Perla, DO    Medical history:  Social History:  reports that she has been smoking Cigarettes.  She has been smoking about 0 packs per day. She does not have any smokeless tobacco history on file. She reports that she drinks alcohol. She reports that she does not use illicit drugs.  Family History  Problem Relation Age of Onset  . Diabetes Sister         HTN  Review of Systems:  Constitutional: Denies fever, chills, diaphoresis, c/o appetite change and fatigue.  HEENT: Denies photophobia, eye pain, redness, hearing loss, ear pain, congestion, sore throat, rhinorrhea, sneezing, mouth sores, trouble swallowing, neck pain, neck stiffness and tinnitus.   Respiratory: Denies SOB, DOE, cough, chest tightness,  and wheezing.   Cardiovascular: Denies chest pain, palpitations and leg swelling.  Gastrointestinal: Denies nausea, vomiting, abdominal pain, diarrhea, constipation, blood in stool and abdominal distention.  Genitourinary: Denies dysuria, urgency, frequency, hematuria, flank pain and difficulty urinating.  Musculoskeletal: C/O myalgias, back pain,denies joint swelling, arthralgias and gait problem.  Neurological: c/o headache. Denies dizziness, seizures, syncope, weakness, light-headedness, numbness .Marland Kitchen     Physical Exam:  Filed Vitals:   03/09/11 1328 03/09/11 1430 03/09/11 1525 03/09/11 1658  BP: 184/110  191/100 187/99  Pulse: 87  75 88  Temp: 97.8 F (36.6 C) 97.5 F (36.4 C)    TempSrc: Oral Oral    Resp: 16  14 16   SpO2: 100%  100% 97%    Constitutional: Vital signs reviewed.  Patient is a confused in moderate distress . Alert and oriented x1.  Eyes: PERRL, EOMI, conjunctivae normal,  No scleral icterus.  Neck: Supple, no stifnessTrachea midline normal ROM, No JVD, mass, thyromegaly, or carotid bruit present.  Cardiovascular: RRR, S1 normal, S2 normal Pulmonary/Chest: CTAB, no wheezes, rales, or rhonchi Abdominal: Soft. Non-tender, non-distended, bowel sounds are normal, no masses, organomegaly, or guarding present.  Ext:  no edema and no cyanosis, pulses palpable bilaterally .  Neurological: A&O x3, left naso labil fold flattening,left sided weakness 4+/5.right side 5/5.  sensory intact to light touch bilaterally.    Labs on Admission:  Results for orders placed during the hospital encounter of 03/09/11 (from the past 48 hour(s))  CBC     Status: Normal   Collection Time   03/09/11  2:40 PM      Component Value Range Comment   WBC 6.2  4.0 - 10.5 (K/uL)    RBC 4.31  3.87 - 5.11 (MIL/uL)    Hemoglobin 12.4  12.0 - 15.0 (g/dL)    HCT 16.1  09.6 - 04.5 (%)    MCV 83.8  78.0 - 100.0 (fL)    MCH 28.8  26.0 - 34.0 (pg)    MCHC 34.3  30.0 - 36.0 (g/dL)    RDW 40.9  81.1 - 91.4 (%)    Platelets 395  150 - 400 (K/uL)   DIFFERENTIAL     Status: Abnormal   Collection Time   03/09/11  2:40 PM      Component Value Range Comment   Neutrophils Relative 71  43 - 77 (%)    Neutro Abs 4.4  1.7 - 7.7 (K/uL)    Lymphocytes Relative 16  12 - 46 (%)    Lymphs Abs 1.0  0.7 - 4.0 (K/uL)    Monocytes Relative 13 (*) 3 - 12 (%)    Monocytes Absolute 0.8  0.1 - 1.0 (K/uL)    Eosinophils Relative 0  0 - 5 (%)    Eosinophils Absolute 0.0  0.0 - 0.7 (K/uL)    Basophils Relative 0  0 - 1 (%)    Basophils Absolute 0.0  0.0 - 0.1 (K/uL)   COMPREHENSIVE METABOLIC PANEL     Status: Abnormal   Collection Time   03/09/11  2:40 PM      Component Value Range Comment   Sodium 125 (*) 135 - 145 (mEq/L)    Potassium 3.2 (*) 3.5 - 5.1 (mEq/L)    Chloride 86 (*) 96 - 112 (mEq/L)    CO2 24  19 - 32 (mEq/L)    Glucose, Bld 121 (*) 70 - 99 (mg/dL)    BUN 7  6 - 23 (mg/dL)    Creatinine, Ser 7.82 (*) 0.50 - 1.10 (mg/dL)    Calcium 9.3  8.4 - 10.5 (mg/dL)    Total Protein 8.3  6.0 - 8.3 (g/dL)    Albumin 3.3 (*) 3.5 - 5.2 (g/dL)    AST 23  0 - 37 (U/L)    ALT 26  0 - 35 (U/L)    Alkaline Phosphatase 93  39 - 117 (U/L)    Total Bilirubin 0.4  0.3 - 1.2 (mg/dL)    GFR calc non Af Amer >90  >90 (mL/min)    GFR calc Af Amer >90  >90  (mL/min)   AMMONIA     Status: Normal   Collection Time   03/09/11  3:05 PM      Component Value Range Comment   Ammonia 38  11 - 60 (umol/L)   POCT I-STAT, CHEM 8  Status: Abnormal   Collection Time   03/09/11  3:12 PM      Component Value Range Comment   Sodium 126 (*) 135 - 145 (mEq/L)    Potassium 3.0 (*) 3.5 - 5.1 (mEq/L)    Chloride 90 (*) 96 - 112 (mEq/L)    BUN 5 (*) 6 - 23 (mg/dL)    Creatinine, Ser 6.21  0.50 - 1.10 (mg/dL)    Glucose, Bld 308 (*) 70 - 99 (mg/dL)    Calcium, Ion 6.57 (*) 1.12 - 1.32 (mmol/L)    TCO2 25  0 - 100 (mmol/L)    Hemoglobin 15.0  12.0 - 15.0 (g/dL)    HCT 84.6  96.2 - 95.2 (%)     Radiological Exams on Admission: Ct Head Wo Contrast  03/09/2011  *RADIOLOGY REPORT*  Clinical Data: Altered mental status, frontal headache  CT HEAD WITHOUT CONTRAST  Technique:  Contiguous axial images were obtained from the base of the skull through the vertex without contrast.  Comparison: 02/26/2011  Findings: No skull fracture is noted.  Paranasal sinuses and mastoid air cells are unremarkable.  No intracranial hemorrhage, mass effect or midline shift.  No intra or extra-axial fluid collection.  Ventricular size is stable from prior exam.  No acute infarction.  No mass lesion is noted on this unenhanced scan.  IMPRESSION: No acute intracranial abnormality.  No significant change.  Original Report Authenticated By: Natasha Mead, M.D.   Ct Abdomen Pelvis W Contrast  03/08/2011  *RADIOLOGY REPORT*  Clinical Data: Abdominal pain  CT ABDOMEN AND PELVIS WITH CONTRAST  Technique:  Multidetector CT imaging of the abdomen and pelvis was performed following the standard protocol during bolus administration of intravenous contrast.  Contrast: OMNIPAQUE IOHEXOL 300 MG/ML IV SOLN  Comparison: None  Findings: The lung bases appear clear.  The spleen is normal.  There are no focal liver abnormalities identified.  Gallbladder negative.  No biliary dilatation.  Normal appearance of the  common bile duct.  The pancreas is unremarkable.  The adrenal glands are both normal.  There is a large simple appearing cyst within the right kidney measuring 5.3 cm. Small hypodensity within the upper pole of the lumbar left kidney is too small to characterize measuring 6 mm, image 16.  No upper abdominal adenopathy.  There is no pelvic or inguinal adenopathy.  Urinary bladder appears normal.  There is a large fibroid arising from the uterine fundus measuring approximately 3.9 cm.  The stomach and the small bowel loops are unremarkable.  No evidence for bowel obstruction.  The appendix is identified and appears normal.  The colon is negative.  Review of the visualized osseous structures is significant for mild scoliosis and multilevel spondylosis.  There is a first-degree anterolisthesis of L4 on L5.  IMPRESSION:  1.  No acute findings within the abdomen or pelvis. 2.  The appendix is identified and appears normal.  Original Report Authenticated By: Rosealee Albee, M.D.   Dg Chest Port 1 View  03/09/2011  *RADIOLOGY REPORT*  Clinical Data: Body ache, headache and altered mental status.  PORTABLE CHEST - 1 VIEW  Comparison: 02/26/2011  Findings: No pulmonary infiltrate, edema or pleural fluid identified.  Heart size is normal.  Stable scoliosis of the thoracic spine.  IMPRESSION: No active disease.  Original Report Authenticated By: Reola Calkins, M.D.    Assessment/Plan Principal Problem:  *Headache/left sided weakness Admit to telemetry unclear etiology, I personally did not appreciate significant  meningeal signs  on exam, no fever or intubated white count. However I have consulted Dr. Roseanne Reno from neurology and he recommended LP. ER physician we'll perform LP, I will cover empirically with Rocephin, vancomycin,acyclovir and dexamethasone.follow spinal fluid results and adjust accordingly. Order for MRI brain to rule out intracranial pathology. Neuro checks. Further recommendations as per  neurology (consult report pending). Active Problems:  Altered mental status Keep the patient n.p.o.Swallow evaluation and if passes would place on clear liquids for now.Check urine drug screen  Elevated blood pressure Blood  pressure is running high however she doesn't have any significant hypertension it could be  related to pain.Observe blood pressure after pain control and if no improvement would consider antihypertensive medication.  Back pain Obtain  lumbar spine MRI  Nausea and vomiting/ Dehydration Zofran when necessary for nausea, IV fluids  Hyponatremia Probably secondary to dehydration, hydrate with normal saline, check urine sodium, urine osmolality, serum osmolality, TSH and cortisol level.  Hypokalemia Replete potassium and check magnesium level. H.PYLORI gastritis: Continue meds Code status: Full code              Time Spent on Admission:   Hanadi Stanly 03/09/2011, 5:14 PM

## 2011-03-09 NOTE — ED Notes (Signed)
Pt being positioned for insertion of foley catheter, able to obey commands but slowly and with repeated coaching, speech slurred, pt repeating "I don't know what I'm supposed to do".  Pt states "my head hurts", reports generalized severe headache, c/o  Nausea and vomits apx of thick, green emesis.  Foley insertion aborted at this time, pt positioned in left lateral position, MD notified.

## 2011-03-09 NOTE — Progress Notes (Signed)
[facility] addendum. I saw this 51 year old female earlier post admission to arrange lumbar puncture for a.m. and initiate ordered antibiotics for possible aseptic meningitis as per my discussion with neurology Dr. Thad Ranger. I saw the patient at the bedside at that time and she appeared alert but only partially oriented to time. This was not different from the neurology consult impression by Dr. Roseanne Reno. Nursing call me back up to the floor do to an incident where the patient became acutely more confused and agitated. It appears as if all her vital signs were stable during this incident. By the time I got to the floor the patient was resting quietly in bed with her eyes closed. She was found to be easily arousable. I did repeat her neurologic exam and find no changes from the prior baseline. I did rediscuss this with Dr. Thad Ranger of neurology who had no further recommendations other than continuing with the ordered antibiotics , lumbar puncture in a.m., MRI in a.m., and attempting to normalize the existing hyponatremia. All orders are in place for this. Patient was noted quite hypertensive earlier and was given a dose of when necessary hydralazine 10 mg IV. Her blood pressure now is in the 120/60 range. She no longer complains of headache. I have updated the patient and family at bedside. I did address their questions. Nursing will continue to monitor the patient and update me of any further changes.

## 2011-03-10 ENCOUNTER — Inpatient Hospital Stay (HOSPITAL_COMMUNITY): Payer: PRIVATE HEALTH INSURANCE

## 2011-03-10 ENCOUNTER — Other Ambulatory Visit: Payer: Self-pay | Admitting: Interventional Radiology

## 2011-03-10 LAB — BLOOD GAS, ARTERIAL
Acid-Base Excess: 0.9 mmol/L (ref 0.0–2.0)
Bicarbonate: 22.7 mEq/L (ref 20.0–24.0)
Drawn by: 33686
FIO2: 0.21 %
O2 Saturation: 96.5 %
Patient temperature: 98.6
TCO2: 20.2 mmol/L (ref 0–100)
pCO2 arterial: 28.4 mmHg — ABNORMAL LOW (ref 35.0–45.0)
pH, Arterial: 7.514 — ABNORMAL HIGH (ref 7.350–7.400)
pO2, Arterial: 84.6 mmHg (ref 80.0–100.0)

## 2011-03-10 LAB — URINE CULTURE
Colony Count: 25000
Culture  Setup Time: 201302092110

## 2011-03-10 LAB — CBC
HCT: 37.7 % (ref 36.0–46.0)
Hemoglobin: 12.5 g/dL (ref 12.0–15.0)
MCH: 28.2 pg (ref 26.0–34.0)
MCHC: 33.2 g/dL (ref 30.0–36.0)
MCV: 84.9 fL (ref 78.0–100.0)
Platelets: 345 10*3/uL (ref 150–400)
RBC: 4.44 MIL/uL (ref 3.87–5.11)
RDW: 12.9 % (ref 11.5–15.5)
WBC: 7.5 10*3/uL (ref 4.0–10.5)

## 2011-03-10 LAB — CSF CELL COUNT WITH DIFFERENTIAL
RBC Count, CSF: 0 /mm3
RBC Count, CSF: 2 /mm3 — ABNORMAL HIGH
Tube #: 1
Tube #: 4
WBC, CSF: 2 /mm3 (ref 0–5)
WBC, CSF: 4 /mm3 (ref 0–5)

## 2011-03-10 LAB — COMPREHENSIVE METABOLIC PANEL
ALT: 21 U/L (ref 0–35)
AST: 19 U/L (ref 0–37)
Albumin: 3.2 g/dL — ABNORMAL LOW (ref 3.5–5.2)
Alkaline Phosphatase: 85 U/L (ref 39–117)
BUN: 12 mg/dL (ref 6–23)
CO2: 23 mEq/L (ref 19–32)
Calcium: 9.4 mg/dL (ref 8.4–10.5)
Chloride: 101 mEq/L (ref 96–112)
Creatinine, Ser: 1.06 mg/dL (ref 0.50–1.10)
GFR calc Af Amer: 70 mL/min — ABNORMAL LOW (ref >90)
GFR calc non Af Amer: 61 mL/min — ABNORMAL LOW (ref >90)
Glucose, Bld: 124 mg/dL — ABNORMAL HIGH (ref 70–99)
Potassium: 3.3 mEq/L — ABNORMAL LOW (ref 3.5–5.1)
Sodium: 139 mEq/L (ref 135–145)
Total Bilirubin: 0.3 mg/dL (ref 0.3–1.2)
Total Protein: 7.9 g/dL (ref 6.0–8.3)

## 2011-03-10 LAB — PROTEIN AND GLUCOSE, CSF
Glucose, CSF: 79 mg/dL — ABNORMAL HIGH (ref 43–76)
Total  Protein, CSF: 76 mg/dL — ABNORMAL HIGH (ref 15–45)

## 2011-03-10 LAB — TSH: TSH: 0.47 u[IU]/mL (ref 0.350–4.500)

## 2011-03-10 LAB — SODIUM, URINE, RANDOM: Sodium, Ur: 40 mEq/L

## 2011-03-10 LAB — CORTISOL: Cortisol, Plasma: 16.1 ug/dL

## 2011-03-10 LAB — OSMOLALITY, URINE: Osmolality, Ur: 125 mOsm/kg — ABNORMAL LOW (ref 390–1090)

## 2011-03-10 LAB — OSMOLALITY: Osmolality: 287 mOsm/kg (ref 275–300)

## 2011-03-10 MED ORDER — PANTOPRAZOLE SODIUM 40 MG PO TBEC
40.0000 mg | DELAYED_RELEASE_TABLET | Freq: Two times a day (BID) | ORAL | Status: DC
  Administered 2011-03-10 – 2011-03-19 (×19): 40 mg via ORAL
  Filled 2011-03-10 (×21): qty 1

## 2011-03-10 MED ORDER — CLARITHROMYCIN 500 MG PO TABS
500.0000 mg | ORAL_TABLET | Freq: Two times a day (BID) | ORAL | Status: DC
  Administered 2011-03-10 – 2011-03-11 (×3): 500 mg via ORAL
  Filled 2011-03-10 (×7): qty 1

## 2011-03-10 MED ORDER — AMOXICILLIN 500 MG PO CAPS
1000.0000 mg | ORAL_CAPSULE | Freq: Two times a day (BID) | ORAL | Status: DC
  Administered 2011-03-10 – 2011-03-11 (×3): 1000 mg via ORAL
  Filled 2011-03-10 (×7): qty 2

## 2011-03-10 MED ORDER — LORAZEPAM 2 MG/ML IJ SOLN
0.5000 mg | INTRAMUSCULAR | Status: DC | PRN
  Administered 2011-03-10 – 2011-03-12 (×3): 0.5 mg via INTRAVENOUS
  Administered 2011-03-13: 2 mg via INTRAVENOUS
  Administered 2011-03-13 – 2011-03-19 (×9): 0.5 mg via INTRAVENOUS
  Filled 2011-03-10 (×13): qty 1

## 2011-03-10 MED ORDER — POTASSIUM CHLORIDE CRYS ER 20 MEQ PO TBCR
40.0000 meq | EXTENDED_RELEASE_TABLET | Freq: Once | ORAL | Status: AC
  Administered 2011-03-10: 40 meq via ORAL
  Filled 2011-03-10: qty 2

## 2011-03-10 MED ORDER — DEXTROSE 5 % IV SOLN
10.0000 mg/kg | Freq: Three times a day (TID) | INTRAVENOUS | Status: DC
  Administered 2011-03-10 – 2011-03-11 (×4): 690 mg via INTRAVENOUS
  Filled 2011-03-10 (×6): qty 13.8

## 2011-03-10 MED ORDER — GADOBENATE DIMEGLUMINE 529 MG/ML IV SOLN
13.0000 mL | Freq: Once | INTRAVENOUS | Status: AC | PRN
  Administered 2011-03-10: 13 mL via INTRAVENOUS

## 2011-03-10 MED ORDER — MORPHINE SULFATE 2 MG/ML IJ SOLN
1.0000 mg | Freq: Once | INTRAMUSCULAR | Status: AC
Start: 2011-03-10 — End: 2011-03-10
  Administered 2011-03-10: 1 mg via INTRAVENOUS
  Filled 2011-03-10: qty 1

## 2011-03-10 MED ORDER — DEXAMETHASONE SODIUM PHOSPHATE 4 MG/ML IJ SOLN
4.0000 mg | Freq: Four times a day (QID) | INTRAMUSCULAR | Status: DC
  Administered 2011-03-10 – 2011-03-11 (×5): 4 mg via INTRAVENOUS
  Filled 2011-03-10 (×11): qty 1

## 2011-03-10 MED ORDER — VANCOMYCIN HCL 1000 MG IV SOLR
750.0000 mg | Freq: Two times a day (BID) | INTRAVENOUS | Status: DC
  Administered 2011-03-10 – 2011-03-11 (×2): 750 mg via INTRAVENOUS
  Filled 2011-03-10 (×4): qty 750

## 2011-03-10 MED ORDER — LORAZEPAM 2 MG/ML IJ SOLN: 0.5000 mg | INTRAMUSCULAR | Status: DC

## 2011-03-10 MED ORDER — HYDROMORPHONE HCL PF 1 MG/ML IJ SOLN
0.5000 mg | Freq: Once | INTRAMUSCULAR | Status: AC
  Administered 2011-03-10: 0.5 mg via INTRAVENOUS
  Filled 2011-03-10: qty 1

## 2011-03-10 NOTE — Progress Notes (Signed)
Subjective: Patient states that her headache and back pain has resolved. She is alert and oriented x3.  Objective: Weight change:   Intake/Output Summary (Last 24 hours) at 03/10/11 1142 Last data filed at 03/10/11 0536  Gross per 24 hour  Intake    552 ml  Output   1400 ml  Net   -848 ml    Filed Vitals:   03/10/11 0600  BP: 175/99  Pulse: 98  Temp: 98.3 F (36.8 C)  Resp: 18   general: Patient appears her stated age. Cardiovascular: Regular rate rhythm. Lungs: Clear bilaterally Abdomen positive bowel sounds Extremities no edema Neuro: patient able to move all extremities she is alert and oriented x3.   Lab Results: reviewed  Micro Results: Recent Results (from the past 240 hour(s))  CULTURE, BLOOD (ROUTINE X 2)     Status: Normal (Preliminary result)   Collection Time   03/09/11  2:45 PM      Component Value Range Status Comment   Specimen Description BLOOD LEFT AC   Final    Special Requests BOTTLES DRAWN AEROBIC AND ANAEROBIC 6 CC EACH   Final    Culture  Setup Time [id number]   Final    Culture     Final    Value:        BLOOD CULTURE RECEIVED NO GROWTH TO DATE CULTURE WILL BE HELD FOR 5 DAYS BEFORE ISSUING A FINAL NEGATIVE REPORT   Report Status PENDING   Incomplete   CULTURE, BLOOD (ROUTINE X 2)     Status: Normal (Preliminary result)   Collection Time   03/09/11  3:00 PM      Component Value Range Status Comment   Specimen Description BLOOD LEFT HAND   Final    Special Requests BOTTLES DRAWN AEROBIC AND ANAEROBIC 5 CC EACH   Final    Culture  Setup Time [id number]   Final    Culture     Final    Value:        BLOOD CULTURE RECEIVED NO GROWTH TO DATE CULTURE WILL BE HELD FOR 5 DAYS BEFORE ISSUING A FINAL NEGATIVE REPORT   Report Status PENDING   Incomplete     Studies/Results: Dg Chest 2 View  02/26/2011  *RADIOLOGY REPORT*  Clinical Data:  Headache and vomiting  CHEST - 2 VIEW  Comparison: Chest radiograph 07/16/2007  Findings: Normal mediastinum and  cardiac silhouette.  Normal pulmonary  vasculature.  No evidence of effusion, infiltrate, or pneumothorax.  No acute bony abnormality.  IMPRESSION: No acute cardiopulmonary process.  Original Report Authenticated By: Genevive Bi, M.D.   Ct Head Wo Contrast  03/09/2011  *RADIOLOGY REPORT*  Clinical Data: Altered mental status, frontal headache  CT HEAD WITHOUT CONTRAST  Technique:  Contiguous axial images were obtained from the base of the skull through the vertex without contrast.  Comparison: 02/26/2011  Findings: No skull fracture is noted.  Paranasal sinuses and mastoid air cells are unremarkable.  No intracranial hemorrhage, mass effect or midline shift.  No intra or extra-axial fluid collection.  Ventricular size is stable from prior exam.  No acute infarction.  No mass lesion is noted on this unenhanced scan.  IMPRESSION: No acute intracranial abnormality.  No significant change.  Original Report Authenticated By: Natasha Mead, M.D.   Ct Head Wo Contrast  02/26/2011  *RADIOLOGY REPORT*  Clinical Data: Headache, nausea, vomiting.  CT HEAD WITHOUT CONTRAST  Technique:  Contiguous axial images were obtained from the base of the skull through  the vertex without contrast.  Comparison: None.  Findings: No acute intracranial abnormality.  Specifically, no hemorrhage, hydrocephalus, mass lesion, acute infarction, or significant intracranial injury.  No acute calvarial abnormality. Visualized paranasal sinuses and mastoids clear.  Orbital soft tissues unremarkable.  IMPRESSION: Normal study.  Original Report Authenticated By: Cyndie Chime, M.D.   US Abdomen Complete  02/26/2011  *RADIOLOGY REPORT*  Clinical Data:  Elevated liver function tests.  Nausea and vomiting.  COMPLETE ABDOMINAL ULTRASOUND 02/26/2011:  Comparison:  None.  Findings:  Gallbladder:  No shadowing gallstones or echogenic sludge.  No gallbladder wall thickening or pericholecystic fluid.  Negative sonographic Murphy's sign according to the  ultrasound technologist.  Common bile duct:  Normal in caliber with maximum diameter approximating 4 mm.  Liver:  Normal size and echotexture without focal parenchymal abnormality.  Patent portal vein with hepatopetal flow.  IVC:  Patent.  Pancreas:  Normal size and echotexture without focal parenchymal abnormality.  Spleen:  Normal size and echotexture without focal parenchymal abnormality.  Right Kidney:  No hydronephrosis.  Well-preserved cortex.  Normal parenchymal echotexture.  Simple cyst arising from the upper pole measuring approximately 6.6 x 5.0 x 5.4 cm.  No significant focal parenchymal abnormality.  No visible shadowing calculi. Approximately 12.9 cm in length.  Left Kidney:  No hydronephrosis.  Well-preserved cortex.  No shadowing calculi.  Normal size and parenchymal echotexture without focal abnormalities.  Approximately 13.2 cm in length.  Abdominal aorta:  Normal in caliber throughout its visualized course in the abdomen without significant atherosclerosis.  IMPRESSION:  1.  No abnormalities involving the liver, gallbladder, or biliary tree to explain elevated liver function tests. 2.  Approximate 6.6 cm simple cyst arising from the upper pole of the right kidney.  No significant abnormalities.  Original Report Authenticated By: Arnell Sieving, M.D.   Ct Abdomen Pelvis W Contrast  03/08/2011  *RADIOLOGY REPORT*  Clinical Data: Abdominal pain  CT ABDOMEN AND PELVIS WITH CONTRAST  Technique:  Multidetector CT imaging of the abdomen and pelvis was performed following the standard protocol during bolus administration of intravenous contrast.  Contrast: OMNIPAQUE IOHEXOL 300 MG/ML IV SOLN  Comparison: None  Findings: The lung bases appear clear.  The spleen is normal.  There are no focal liver abnormalities identified.  Gallbladder negative.  No biliary dilatation.  Normal appearance of the common bile duct.  The pancreas is unremarkable.  The adrenal glands are both normal.  There is a  large simple appearing cyst within the right kidney measuring 5.3 cm. Small hypodensity within the upper pole of the lumbar left kidney is too small to characterize measuring 6 mm, image 16.  No upper abdominal adenopathy.  There is no pelvic or inguinal adenopathy.  Urinary bladder appears normal.  There is a large fibroid arising from the uterine fundus measuring approximately 3.9 cm.  The stomach and the small bowel loops are unremarkable.  No evidence for bowel obstruction.  The appendix is identified and appears normal.  The colon is negative.  Review of the visualized osseous structures is significant for mild scoliosis and multilevel spondylosis.  There is a first-degree anterolisthesis of L4 on L5.  IMPRESSION:  1.  No acute findings within the abdomen or pelvis. 2.  The appendix is identified and appears normal.  Original Report Authenticated By: Rosealee Albee, M.D.   Dg Chest Port 1 View  03/09/2011  *RADIOLOGY REPORT*  Clinical Data: Body ache, headache and altered mental status.  PORTABLE CHEST -  1 VIEW  Comparison: 02/26/2011  Findings: No pulmonary infiltrate, edema or pleural fluid identified.  Heart size is normal.  Stable scoliosis of the thoracic spine.  IMPRESSION: No active disease.  Original Report Authenticated By: Reola Calkins, M.D.   Medications: Scheduled Meds:   . acyclovir  10 mg/kg (Order-Specific) Intravenous Q8H  . amoxicillin  1,000 mg Oral Q12H  . cefTRIAXone (ROCEPHIN)  IV  2 g Intravenous Q12H  . clarithromycin  500 mg Oral Q12H  . dexamethasone  4 mg Intravenous Q6H  .  HYDROmorphone (DILAUDID) injection  0.5 mg Intravenous Once  . lidocaine      . LORazepam  0.5 mg Intravenous Q4H  .  morphine injection  1 mg Intravenous Once  . naloxone (NARCAN) injection  1 mg Intravenous Once  . ondansetron  4 mg Intravenous Once  . pantoprazole  40 mg Oral BID AC  . potassium chloride  40 mEq Oral Once  . sodium chloride  500 mL Intravenous Once  . sodium chloride   3 mL Intravenous Q12H  . vancomycin  750 mg Intravenous Q8H  . DISCONTD: amoxicillin-clarithromycin-lansoprazole   Oral BID  . DISCONTD: enoxaparin  40 mg Subcutaneous Q24H  . DISCONTD: LORazepam  0.5 mg Intravenous Q4H   Continuous Infusions:   . 0.9 % NaCl with KCl 20 mEq / L 125 mL/hr at 03/10/11 0145  . DISCONTD: sodium chloride 125 mL/hr at 03/09/11 1716   PRN Meds:.acetaminophen, acetaminophen, hydrALAZINE, ondansetron (ZOFRAN) IV, ondansetron, oxyCODONE  Assessment/Plan: Headache (03/09/2011)/? Aseptic Menigitis Patient stated that her headache has resolved. She had LP and results still pending.   Altered mental status (03/09/2011) Resolved   Elevated blood pressure (03/09/2011) Blood pressure still slightly elevated. We will treat her with when necessary hydralazine   Back pain (03/09/2011) She states that her back pain has resolved   Nausea and vomiting (03/09/2011) She stated that her nausea vomiting has resolved   Hyponatremia (03/09/2011) Secondary to dehydration continue IV fluids decreased rate.  Hypokalemia (03/09/2011) Replete   Dehydration (03/09/2011)  IV fluids    LOS: 1 day   Nathalya Wolanski JARRETT 03/10/2011, 11:42 AM

## 2011-03-10 NOTE — Progress Notes (Signed)
Rapid response called to evaluate patient due to change in behavior and low blood pressure. On call attending notified.

## 2011-03-10 NOTE — Progress Notes (Signed)
Patient very anxious, still complaining of back and head pain. She's crying and moving erratically in bed. Attending again notified and rapid response nurse called again to further assess patient. New order given for pain.

## 2011-03-10 NOTE — Progress Notes (Signed)
RN called to room by another nurse because patient was again nearly coming out of the bed, crying, and complaining of the same pain to her abdomen, head, and back. Attending notified.

## 2011-03-10 NOTE — Procedures (Signed)
Procedure:  Lumbar puncture under fluoroscopy Findings:  Translaminar access at L3 with 20G spinal needle Clear CSF Opening pressure 8-10 mm H2O Approximately 16 cc collected in 4 vials

## 2011-03-10 NOTE — Progress Notes (Signed)
Attending T. Claiborne Billings, NP came up to see patient. New order given.

## 2011-03-10 NOTE — Progress Notes (Signed)
Subjective: Patient states she feels much better today.  Minimal to no headache.  No extremity, abdominal or back pain.  No blurred or double vision, no difficulty swallowing, no n/v/d/c, CP or SOB.  Objective: Blood pressure 175/99, pulse 98, temperature 98.3 F (36.8 C), temperature source Oral, resp. rate 18, height 5\' 9"  (1.753 m), weight 68.947 kg (152 lb), SpO2 98.00%.  Intake/Output Summary (Last 24 hours) at 03/10/11 1335 Last data filed at 03/10/11 0536  Gross per 24 hour  Intake    552 ml  Output   1400 ml  Net   -848 ml    Exam: During exam, patient was observed trying to pick up a cup by reaching across her body.  In doing so, both legs and arms displayed a rather uncoordinated myoclonic jerking that resolved when pt righted herself.  Patient denied awareness of this, but it was observed by the two visitors present during exam.  Mental Status: Alert, oriented, thought content appropriate.  Speech fluent without evidence of aphasia. Able to follow 3 step commands with minimal difficulty. Cranial Nerves: II- Visual fields grossly intact. III/IV/VI-Extraocular movements intact.  Pupils reactive bilaterally. V/VII-Smile symmetric VIII-grossly intact IX/X-normal gag XI-bilateral shoulder shrug XII-midline tongue extension Motor: 5/5  RUE, -5/5 LUE proximal strength.  Grips relatively equal.  Normal tone and bulk.  No drift. Sensory:  light touch intact throughout, discrimination intact. Deep Tendon Reflexes: absent throughout Plantars: equivocal. Cerebellar:  Slightly dysmetric on F to N and H to S on the left.  Orbits left slightly.  Normal on right.  Labs: CBC     Status: Normal   03/09/11  2:40 PM      Component Value Range Comment   WBC 6.2  4.0 - 10.5 (K/uL)    RBC 4.31  3.87 - 5.11 (MIL/uL)    Hemoglobin 12.4  12.0 - 15.0 (g/dL)    HCT 40.9  81.1 - 91.4 (%)    MCV 83.8  78.0 - 100.0 (fL)    MCH 28.8  26.0 - 34.0 (pg)    MCHC 34.3  30.0 - 36.0 (g/dL)    RDW  78.2  95.6 - 21.3 (%)    Platelets 395  150 - 400 (K/uL)    Neutrophils Relative 71  43 - 77 (%)    Neutro Abs 4.4  1.7 - 7.7 (K/uL)    Lymphocytes Relative 16  12 - 46 (%)    Lymphs Abs 1.0  0.7 - 4.0 (K/uL)    Monocytes Relative 13 (*) 3 - 12 (%)    Monocytes Absolute 0.8  0.1 - 1.0 (K/uL)    Eosinophils Relative 0  0 - 5 (%)    Eosinophils Absolute 0.0  0.0 - 0.7 (K/uL)    Basophils Relative 0  0 - 1 (%)    Basophils Absolute 0.0  0.0 - 0.1 (K/uL)   COMPREHENSIVE METABOLIC PANEL     Status: Abnormal   03/09/11  2:40 PM      Component Value Range Comment   Sodium 125 (*) 135 - 145 (mEq/L)    Potassium 3.2 (*) 3.5 - 5.1 (mEq/L)    Chloride 86 (*) 96 - 112 (mEq/L)    CO2 24  19 - 32 (mEq/L)    Glucose, Bld 121 (*) 70 - 99 (mg/dL)    BUN 7  6 - 23 (mg/dL)    Creatinine, Ser 0.86 (*) 0.50 - 1.10 (mg/dL)    Calcium 9.3  8.4 - 10.5 (mg/dL)  Total Protein 8.3  6.0 - 8.3 (g/dL)    Albumin 3.3 (*) 3.5 - 5.2 (g/dL)    AST 23  0 - 37 (U/L)    ALT 26  0 - 35 (U/L)    Alkaline Phosphatase 93  39 - 117 (U/L)    Total Bilirubin 0.4  0.3 - 1.2 (mg/dL)    GFR calc non Af Amer >90  >90 (mL/min)    GFR calc Af Amer >90  >90 (mL/min)   CULTURE, BLOOD (ROUTINE X 2)     Status: Normal (Preliminary result)   03/09/11  2:45 PM      Component Value Range Comment   Specimen Description BLOOD LEFT AC      Special Requests BOTTLES DRAWN AEROBIC AND ANAEROBIC 6 CC EACH      Culture  Setup Time [id number]      Culture        Value:        BLOOD CULTURE RECEIVED NO GROWTH TO DATE CULTURE WILL BE HELD FOR 5 DAYS BEFORE ISSUING A FINAL NEGATIVE REPORT   Report Status PENDING     CULTURE, BLOOD (ROUTINE X 2)     Status: Normal (Preliminary result)   03/09/11  3:00 PM      Component Value Range Comment   Specimen Description BLOOD LEFT HAND      Special Requests BOTTLES DRAWN AEROBIC AND ANAEROBIC 5 CC EACH      Culture  Setup Time [id number]      Culture        Value:        BLOOD CULTURE RECEIVED NO  GROWTH TO DATE CULTURE WILL BE HELD FOR 5 DAYS BEFORE ISSUING A FINAL NEGATIVE REPORT   Report Status PENDING     AMMONIA     Status: Normal   03/09/11  3:05 PM      Component Value Range Comment   Ammonia 38  11 - 60 (umol/L)   URINALYSIS, ROUTINE W REFLEX MICROSCOPIC     Status: Abnormal   03/09/11  4:43 PM      Component Value Range Comment   Color, Urine YELLOW  YELLOW     APPearance CLEAR  CLEAR     Specific Gravity, Urine 1.015  1.005 - 1.030     pH 7.0  5.0 - 8.0     Glucose, UA NEGATIVE  NEGATIVE (mg/dL)    Hgb urine dipstick NEGATIVE  NEGATIVE     Bilirubin Urine NEGATIVE  NEGATIVE     Ketones, ur 40 (*) NEGATIVE (mg/dL)    Protein, ur 30 (*) NEGATIVE (mg/dL)    Urobilinogen, UA 2.0 (*) 0.0 - 1.0 (mg/dL)    Nitrite NEGATIVE  NEGATIVE     Leukocytes, UA NEGATIVE  NEGATIVE    URINE RAPID DRUG SCREEN (HOSP PERFORMED)     Status: Abnormal   03/09/11  4:43 PM      Component Value Range Comment   Opiates NONE DETECTED  NONE DETECTED     Cocaine NONE DETECTED  NONE DETECTED     Benzodiazepines POSITIVE (*) NONE DETECTED     Amphetamines NONE DETECTED  NONE DETECTED     Tetrahydrocannabinol NONE DETECTED  NONE DETECTED     Barbiturates POSITIVE (*) NONE DETECTED    URINE MICROSCOPIC-ADD ON     Status: Abnormal   03/09/11  4:43 PM      Component Value Range Comment   Squamous Epithelial / LPF FEW (*) RARE  WBC, UA 0-2  <3 (WBC/hpf)    RBC / HPF 0-2  <3 (RBC/hpf)    Bacteria, UA RARE  RARE     Urine-Other RARE YEAST   TRICHOMONAS PRESENT  GLUCOSE, CAPILLARY     Status: Abnormal   Collection Time   03/09/11 10:47 PM      Component Value Range Comment   Glucose-Capillary 127 (*) 70 - 99 (mg/dL)   CBC     Status: Normal   03/10/11  5:50 AM      Component Value Range Comment   WBC 7.5  4.0 - 10.5 (K/uL)    RBC 4.44  3.87 - 5.11 (MIL/uL)    Hemoglobin 12.5  12.0 - 15.0 (g/dL)    HCT 16.1  09.6 - 04.5 (%)    MCV 84.9  78.0 - 100.0 (fL)    MCH 28.2  26.0 - 34.0 (pg)    MCHC 33.2   30.0 - 36.0 (g/dL)    RDW 40.9  81.1 - 91.4 (%)    Platelets 345  150 - 400 (K/uL)   TSH     Status: Normal   03/10/11  5:50 AM      Component Value Range Comment   TSH 0.470  0.350 - 4.500 (uIU/mL)   CORTISOL     Status: Normal   03/10/11  5:50 AM      Component Value Range Comment   Cortisol, Plasma 16.1     COMPREHENSIVE METABOLIC PANEL     Status: Abnormal   03/10/11  5:50 AM      Component Value Range Comment   Sodium 139  135 - 145 (mEq/L)    Potassium 3.3 (*) 3.5 - 5.1 (mEq/L)    Chloride 101  96 - 112 (mEq/L)    CO2 23  19 - 32 (mEq/L)    Glucose, Bld 124 (*) 70 - 99 (mg/dL)    BUN 12  6 - 23 (mg/dL)    Creatinine, Ser 7.82  0.50 - 1.10 (mg/dL)    Calcium 9.4  8.4 - 10.5 (mg/dL)    Total Protein 7.9  6.0 - 8.3 (g/dL)    Albumin 3.2 (*) 3.5 - 5.2 (g/dL)    AST 19  0 - 37 (U/L)    ALT 21  0 - 35 (U/L)    Alkaline Phosphatase 85  39 - 117 (U/L)    Total Bilirubin 0.3  0.3 - 1.2 (mg/dL)    GFR calc non Af Amer 61 (*) >90 (mL/min)    GFR calc Af Amer 70 (*) >90 (mL/min)    CSF CELL COUNT WITH DIFFERENTIAL     Status: Abnormal   03/10/11  1:02 PM      Component Value Range Comment   Tube # 1      Color, CSF COLORLESS  COLORLESS     Appearance, CSF CLEAR  CLEAR     Supernatant NOT INDICATED      RBC Count, CSF 2 (*) 0 (/cu mm)    WBC, CSF 2  0 - 5 (/cu mm)    Segmented Neutrophils-CSF TOO FEW TO COUNT, SMEAR AVAILABLE FOR REVIEW  0 - 6 (%)    Lymphs, CSF TOO FEW TO COUNT, SMEAR AVAILABLE FOR REVIEW  40 - 80 (%)    Monocyte-Macrophage-Spinal Fluid TOO FEW TO COUNT, SMEAR AVAILABLE FOR REVIEW  15 - 45 (%)    Eosinophils, CSF TOO FEW TO COUNT, SMEAR AVAILABLE FOR REVIEW  0 - 1 (%)  Other Cells, CSF RARE LYMPH SEEN ON SMEAR     CSF CELL COUNT WITH DIFFERENTIAL     Status: Normal   03/10/11  1:03 PM      Component Value Range Comment   Tube # 4      Color, CSF COLORLESS  COLORLESS     Appearance, CSF CLEAR  CLEAR     Supernatant NOT INDICATED      RBC Count, CSF 0  0 (/cu  mm)    WBC, CSF 4  0 - 5 (/cu mm)    Segmented Neutrophils-CSF TOO FEW TO COUNT, SMEAR AVAILABLE FOR REVIEW  0 - 6 (%)    Lymphs, CSF TOO FEW TO COUNT, SMEAR AVAILABLE FOR REVIEW  40 - 80 (%)    Monocyte-Macrophage-Spinal Fluid TOO FEW TO COUNT, SMEAR AVAILABLE FOR REVIEW  15 - 45 (%)    Eosinophils, CSF TOO FEW TO COUNT, SMEAR AVAILABLE FOR REVIEW  0 - 1 (%)    Other Cells, CSF RARE LYMPH SEEN ON SMEAR     PROTEIN AND GLUCOSE, CSF     Status: Abnormal   03/10/11  1:05 PM      Component Value Range Comment   Glucose, CSF 79 (*) 43 - 76 (mg/dL)    Total  Protein, CSF 76 (*) 15 - 45 (mg/dL)     02/02/1094 CT HEAD WITHOUT CONTRAST   Findings: No skull fracture is noted.  Paranasal sinuses and mastoid air cells are unremarkable.  No intracranial hemorrhage, mass effect or midline shift.  No intra or extra-axial fluid collection.  Ventricular size is stable from prior exam.  No acute infarction.  No mass lesion is noted on this unenhanced scan.  IMPRESSION: No acute intracranial abnormality.  No significant change.  : LIVIU POP, M.D.   03/10/2011  MRI HEAD WITHOUT AND WITH CONTRAST   Findings: Cortical and subcortical edema is present in the cerebellum and cerebral hemispheres bilaterally.  This is best seen on the FLAIR sequence and T2 sequence.  This is present in the cerebellum bilaterally including the superior cerebellar vermis. Hyperintensities also noted in the occipital lobes bilaterally extending into the frontal and parietal cortex over the convexity. Diffusion weighted imaging is negative.  Normal enhancement is seen following gadolinium administration.  Ventricle size is normal.  No midline shift.  No acute infarct. Pituitary is normal in size.  Negative for intracranial hemorrhage.  IMPRESSION: Findings are most compatible with hypertensive encephalopathy with cortical and subcortical edema in the posterior circulation primarily but also extending into the middle cerebral artery territory  bilaterally.  No fixed infarct is seen at this time. Camelia Phenes, M.D.   03/10/2011    MRI LUMBAR SPINE WITHOUT CONTRAST   Findings: The sagittal MR images demonstrate retrolisthesis of L5 due to facet disease.  No definite pars defects.  There is associated degenerative disc disease at L4-5 and L5-S1.  The remaining lumbar vertebral bodies are normally aligned.  They demonstrate normal marrow signal except for endplate reactive changes at L5-S1.  The last full intervertebral disc space is labeled L5-S1 and the conus medullaris terminates at L1.  No significant paraspinal or retroperitoneal process is identified. There is a large simple appearing right renal cyst.  L1-2:  No significant findings.  L2-3:  No significant findings.  L3-4:  No significant findings.  L4-5:  Diffuse bulging uncovered disc with mild impression on the thecal sac.  However, the spinal canal is quite generous and there is no  significant spinal or lateral recess stenosis.  There is mild left foraminal stenosis and possible irritation of the left L4 nerve root.  Minimal right foraminal encroachment also.  Moderate facet disease.  L5-S1:  Diffuse bulging degenerated uncovered disc along with osteophytic ridging creating mild mass effect on the ventral thecal sac.  There is also mild mass effect on the right S1 nerve root. Bilateral foraminal stenosis, left greater than right.  IMPRESSION:  1.  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 2.  Mild left foraminal stenosis at L4-5 with possible irritation of the left L4 nerve root. 3.  Bilateral foraminal stenosis at L5-S1 along with mild encroach on the right S1 nerve root in the lateral recess.   P. Loralie Champagne, M.D.   Assessment Ms. Samantha [name] is a 51 y.o. aa female with 1. Hypertensive encephalopathy with cortical and subcortical edema in the posterior circulation primarily but also extending into the middle cerebral artery  territory bilaterally.  Headache now resolved.  Mental status clearing.   2. Hypertension- accelerated. 3. Hyponatremia resolved. 4. Hyperkalemia- persists 5.Trichomonas- consider RPR, G/C, HIV testing.  Plan: Continue hydration and aggressive BP management.  Goal SBP< 120-130. No further neurologic intervention is recommended at this time.  If further questions arise, please call or page .Thank you for allowing neurology to participate in the care of this patient. Will need follow up with [facility] in 2-3 weeks. Needs treatment for trich and evaluation for other STDs.  Discussed with Dr. Roseanne Reno who agrees with above.  LOS: 1 day   Marya Fossa PA-C [facility] [phone] 03/10/2011  1:35 PM

## 2011-03-10 NOTE — Progress Notes (Signed)
ANTIBIOTIC CONSULT NOTE - FOLLOW UP  Pharmacy Consult for Vancomycin/Acyclovir (Rocephin per MD) Indication: r/o meningitis  No Known Allergies  Patient Measurements: Height: 5\' 9"  (175.3 cm) Weight: 152 lb (68.947 kg) IBW/kg (Calculated) : 66.2    Vital Signs: Temp: 98.3 Samantha [name] (36.8 C) (02/10 0600) Temp src: Oral (02/10 0600) BP: 175/99 mmHg (02/10 0600) Pulse Rate: 98  (02/10 0600) Intake/Output from previous day: 02/09 0701 - 02/10 0700 In: 552 [IV Piggyback:552] Out: 1400 [Urine:1400] Intake/Output from this shift:    Labs:  Basename 03/10/11 0550 03/09/11 1512 03/09/11 1440 03/08/11 1132  WBC 7.5 -- 6.2 3.6*  HGB 12.5 15.0 12.4 --  PLT 345 -- 395 406.0*  LABCREA -- -- -- --  CREATININE 1.06 0.50 0.36* --   Estimated Creatinine Clearance: 67.1 ml/min (by C-G formula based on Cr of 1.06). No results found for this basename: VANCOTROUGH:2,VANCOPEAK:2,VANCORANDOM:2,GENTTROUGH:2,GENTPEAK:2,GENTRANDOM:2,TOBRATROUGH:2,TOBRAPEAK:2,TOBRARND:2,AMIKACINPEAK:2,AMIKACINTROU:2,AMIKACIN:2, in the last 72 hours   Microbiology: Recent Results (from the past 720 hour(s))  URINE CULTURE     Status: Normal   Collection Time   02/26/11  5:16 PM      Component Value Range Status Comment   Specimen Description URINE, CLEAN CATCH   Final    Special Requests NONE   Final    Culture  Setup Time [id number]   Final    Colony Count 30,000 COLONIES/ML   Final    Culture ESCHERICHIA COLI   Final    Report Status 02/28/2011 FINAL   Final    Organism ID, Bacteria ESCHERICHIA COLI   Final   CULTURE, BLOOD (ROUTINE X 2)     Status: Normal   Collection Time   02/26/11  5:35 PM      Component Value Range Status Comment   Specimen Description BLOOD RIGHT ARM   Final    Special Requests BOTTLES DRAWN AEROBIC AND ANAEROBIC 5VV   Final    Culture  Setup Time [id number]   Final    Culture NO GROWTH 5 DAYS   Final    Report Status 03/05/2011 FINAL   Final   CULTURE, BLOOD (ROUTINE X 2)      Status: Normal   Collection Time   02/26/11  5:35 PM      Component Value Range Status Comment   Specimen Description BLOOD RIGHT ARM   Final    Special Requests BOTTLES DRAWN AEROBIC AND ANAEROBIC 5CC   Final    Culture  Setup Time [id number]   Final    Culture NO GROWTH 5 DAYS   Final    Report Status 03/05/2011 FINAL   Final   RAPID STREP SCREEN     Status: Normal   Collection Time   02/27/11  5:28 PM      Component Value Range Status Comment   Streptococcus, Group A Screen (Direct) NEGATIVE  NEGATIVE  Final   CULTURE, BLOOD (ROUTINE X 2)     Status: Normal (Preliminary result)   Collection Time   03/09/11  2:45 PM      Component Value Range Status Comment   Specimen Description BLOOD LEFT AC   Final    Special Requests BOTTLES DRAWN AEROBIC AND ANAEROBIC 6 CC EACH   Final    Culture  Setup Time [id number]   Final    Culture     Final    Value:        BLOOD CULTURE RECEIVED NO GROWTH TO DATE CULTURE WILL BE HELD FOR 5 DAYS BEFORE ISSUING A FINAL NEGATIVE REPORT  Report Status PENDING   Incomplete   CULTURE, BLOOD (ROUTINE X 2)     Status: Normal (Preliminary result)   Collection Time   03/09/11  3:00 PM      Component Value Range Status Comment   Specimen Description BLOOD LEFT HAND   Final    Special Requests BOTTLES DRAWN AEROBIC AND ANAEROBIC 5 CC EACH   Final    Culture  Setup Time [id number]   Final    Culture     Final    Value:        BLOOD CULTURE RECEIVED NO GROWTH TO DATE CULTURE WILL BE HELD FOR 5 DAYS BEFORE ISSUING A FINAL NEGATIVE REPORT   Report Status PENDING   Incomplete     Anti-infectives     Start     Dose/Rate Route Frequency Ordered Stop   03/10/11 0015   amoxicillin (AMOXIL) capsule 1,000 mg        1,000 mg Oral Every 12 hours 03/10/11 0008 03/22/11 2159   03/10/11 0015   clarithromycin (BIAXIN) tablet 500 mg        500 mg Oral Every 12 hours 03/10/11 0008 03/22/11 2159   03/09/11 2200   cefTRIAXone (ROCEPHIN) 2 g in dextrose 5 % 50 mL IVPB         2 g 100 mL/hr over 30 Minutes Intravenous Every 12 hours 03/09/11 2053     03/09/11 2200   vancomycin (VANCOCIN) 750 mg in sodium chloride 0.9 % 150 mL IVPB        750 mg 150 mL/hr over 60 Minutes Intravenous Every 8 hours 03/09/11 2103     03/09/11 2100   acyclovir (ZOVIRAX) 600 mg in dextrose 5 % 100 mL IVPB        10 mg/kg  60 kg (Order-Specific) 112 mL/hr over 60 Minutes Intravenous 3 times per day 03/09/11 1939            Assessment: 51 yo Samantha [name] on D# 2 vancomycin/rocephin/acyclovir for r/o meninginitis.  Scr bump from yesterday and new weight recorded - will adjust antibiotic doses.  Will follow cultures  Goal of Therapy:  Vancomycin trough 15-20 Acyclovir per renal function  Plan:   Increase acyclovir to 700 mg IV q8h at next dose  Decrease vancomycin to 750 mg IV q12  Follow cultures  Check vancomycin trough at [facility]  Monitor renal function  Harrison Zetina, Loma Messing PharmD 2:37 PM 03/10/2011

## 2011-03-11 DIAGNOSIS — A599 Trichomoniasis, unspecified: Secondary | ICD-10-CM | POA: Diagnosis present

## 2011-03-11 LAB — VANCOMYCIN, TROUGH: Vancomycin Tr: 8.1 ug/mL — ABNORMAL LOW (ref 10.0–20.0)

## 2011-03-11 LAB — VDRL, CSF: VDRL Quant, CSF: NONREACTIVE

## 2011-03-11 MED ORDER — AMLODIPINE BESYLATE 10 MG PO TABS
10.0000 mg | ORAL_TABLET | Freq: Every day | ORAL | Status: DC
  Administered 2011-03-11 – 2011-03-19 (×9): 10 mg via ORAL
  Filled 2011-03-11 (×9): qty 1

## 2011-03-11 MED ORDER — METRONIDAZOLE 500 MG PO TABS
2000.0000 mg | ORAL_TABLET | Freq: Once | ORAL | Status: AC
  Administered 2011-03-11: 2000 mg via ORAL
  Filled 2011-03-11: qty 4

## 2011-03-11 NOTE — Progress Notes (Signed)
Speech Language/Pathology SLP Cancellation Note 1213  Eval cancelled today due to SLP phoned MD, pt on diet and tolerating well per RN/nurse technician.  Also, RN verbalized that pt passed a repeat stroke swallow screen over the weekend.    MD desired to cancel swallow evaluation.  Thanks.  Please reorder if desire.  Donavan Burnet, MS [facility] SLP [phone]

## 2011-03-11 NOTE — Progress Notes (Signed)
Subjective: Patient states that her headache and back pain has resolved. She is alert and oriented x3. Family is at bedside. No complaints today.  Objective: Weight change:   Intake/Output Summary (Last 24 hours) at 03/11/11 1705 Last data filed at 03/11/11 1206  Gross per 24 hour  Intake   1060 ml  Output   1650 ml  Net   -590 ml    Filed Vitals:   03/11/11 0600  BP: 152/88  Pulse: 91  Temp: 97.7 F (36.5 C)  Resp: 20   general: Patient appears her stated age. Cardiovascular: Regular rate rhythm. Lungs: Clear bilaterally Abdomen positive bowel sounds Extremities no edema Neuro: patient able to move all extremities she is alert and oriented x3.   Lab Results: reviewed  Micro Results: Recent Results (from the past 240 hour(s))  CULTURE, BLOOD (ROUTINE X 2)     Status: Normal (Preliminary result)   Collection Time   03/09/11  2:45 PM      Component Value Range Status Comment   Specimen Description BLOOD LEFT AC   Final    Special Requests BOTTLES DRAWN AEROBIC AND ANAEROBIC 6 CC EACH   Final    Culture  Setup Time [id number]   Final    Culture     Final    Value:        BLOOD CULTURE RECEIVED NO GROWTH TO DATE CULTURE WILL BE HELD FOR 5 DAYS BEFORE ISSUING A FINAL NEGATIVE REPORT   Report Status PENDING   Incomplete   CULTURE, BLOOD (ROUTINE X 2)     Status: Normal (Preliminary result)   Collection Time   03/09/11  3:00 PM      Component Value Range Status Comment   Specimen Description BLOOD LEFT HAND   Final    Special Requests BOTTLES DRAWN AEROBIC AND ANAEROBIC 5 CC EACH   Final    Culture  Setup Time [id number]   Final    Culture     Final    Value:        BLOOD CULTURE RECEIVED NO GROWTH TO DATE CULTURE WILL BE HELD FOR 5 DAYS BEFORE ISSUING A FINAL NEGATIVE REPORT   Report Status PENDING   Incomplete   URINE CULTURE     Status: Normal   Collection Time   03/09/11  4:43 PM      Component Value Range Status Comment   Specimen Description URINE, CLEAN CATCH    Final    Special Requests NONE   Final    Culture  Setup Time [id number]   Final    Colony Count 25,000 COLONIES/ML   Final    Culture     Final    Value: Multiple bacterial morphotypes present, none predominant. Suggest appropriate recollection if clinically indicated.   Report Status 03/10/2011 FINAL   Final   BODY FLUID CULTURE     Status: Normal (Preliminary result)   Collection Time   03/10/11  1:03 PM      Component Value Range Status Comment   Specimen Description CSF   Final    Special Requests Normal   Final    Gram Stain     Final    Value: CYTOSPIN SLIDE NO WBC SEEN     NO ORGANISMS SEEN   Culture NO GROWTH 1 DAY   Final    Report Status PENDING   Incomplete   FUNGUS CULTURE W SMEAR     Status: Normal (Preliminary result)   Collection Time  03/10/11  1:03 PM      Component Value Range Status Comment   Specimen Description CSF   Final    Special Requests Normal   Final    Fungal Smear NO YEAST OR FUNGAL ELEMENTS SEEN   Final    Culture CULTURE IN PROGRESS FOR FOUR WEEKS   Final    Report Status PENDING   Incomplete     Studies/Results: Dg Chest 2 View  02/26/2011  *RADIOLOGY REPORT*  Clinical Data:  Headache and vomiting  CHEST - 2 VIEW  Comparison: Chest radiograph 07/16/2007  Findings: Normal mediastinum and cardiac silhouette.  Normal pulmonary  vasculature.  No evidence of effusion, infiltrate, or pneumothorax.  No acute bony abnormality.  IMPRESSION: No acute cardiopulmonary process.  Original Report Authenticated By: Genevive Bi, M.D.   Ct Head Wo Contrast  03/09/2011  *RADIOLOGY REPORT*  Clinical Data: Altered mental status, frontal headache  CT HEAD WITHOUT CONTRAST  Technique:  Contiguous axial images were obtained from the base of the skull through the vertex without contrast.  Comparison: 02/26/2011  Findings: No skull fracture is noted.  Paranasal sinuses and mastoid air cells are unremarkable.  No intracranial hemorrhage, mass effect or midline shift.  No  intra or extra-axial fluid collection.  Ventricular size is stable from prior exam.  No acute infarction.  No mass lesion is noted on this unenhanced scan.  IMPRESSION: No acute intracranial abnormality.  No significant change.  Original Report Authenticated By: Natasha Mead, M.D.   Ct Head Wo Contrast  02/26/2011  *RADIOLOGY REPORT*  Clinical Data: Headache, nausea, vomiting.  CT HEAD WITHOUT CONTRAST  Technique:  Contiguous axial images were obtained from the base of the skull through the vertex without contrast.  Comparison: None.  Findings: No acute intracranial abnormality.  Specifically, no hemorrhage, hydrocephalus, mass lesion, acute infarction, or significant intracranial injury.  No acute calvarial abnormality. Visualized paranasal sinuses and mastoids clear.  Orbital soft tissues unremarkable.  IMPRESSION: Normal study.  Original Report Authenticated By: Cyndie Chime, M.D.   US Abdomen Complete  02/26/2011  *RADIOLOGY REPORT*  Clinical Data:  Elevated liver function tests.  Nausea and vomiting.  COMPLETE ABDOMINAL ULTRASOUND 02/26/2011:  Comparison:  None.  Findings:  Gallbladder:  No shadowing gallstones or echogenic sludge.  No gallbladder wall thickening or pericholecystic fluid.  Negative sonographic Murphy's sign according to the ultrasound technologist.  Common bile duct:  Normal in caliber with maximum diameter approximating 4 mm.  Liver:  Normal size and echotexture without focal parenchymal abnormality.  Patent portal vein with hepatopetal flow.  IVC:  Patent.  Pancreas:  Normal size and echotexture without focal parenchymal abnormality.  Spleen:  Normal size and echotexture without focal parenchymal abnormality.  Right Kidney:  No hydronephrosis.  Well-preserved cortex.  Normal parenchymal echotexture.  Simple cyst arising from the upper pole measuring approximately 6.6 x 5.0 x 5.4 cm.  No significant focal parenchymal abnormality.  No visible shadowing calculi. Approximately 12.9 cm in  length.  Left Kidney:  No hydronephrosis.  Well-preserved cortex.  No shadowing calculi.  Normal size and parenchymal echotexture without focal abnormalities.  Approximately 13.2 cm in length.  Abdominal aorta:  Normal in caliber throughout its visualized course in the abdomen without significant atherosclerosis.  IMPRESSION:  1.  No abnormalities involving the liver, gallbladder, or biliary tree to explain elevated liver function tests. 2.  Approximate 6.6 cm simple cyst arising from the upper pole of the right kidney.  No significant abnormalities.  Original Report  Authenticated By: Arnell Sieving, M.D.   Ct Abdomen Pelvis W Contrast  03/08/2011  *RADIOLOGY REPORT*  Clinical Data: Abdominal pain  CT ABDOMEN AND PELVIS WITH CONTRAST  Technique:  Multidetector CT imaging of the abdomen and pelvis was performed following the standard protocol during bolus administration of intravenous contrast.  Contrast: OMNIPAQUE IOHEXOL 300 MG/ML IV SOLN  Comparison: None  Findings: The lung bases appear clear.  The spleen is normal.  There are no focal liver abnormalities identified.  Gallbladder negative.  No biliary dilatation.  Normal appearance of the common bile duct.  The pancreas is unremarkable.  The adrenal glands are both normal.  There is a large simple appearing cyst within the right kidney measuring 5.3 cm. Small hypodensity within the upper pole of the lumbar left kidney is too small to characterize measuring 6 mm, image 16.  No upper abdominal adenopathy.  There is no pelvic or inguinal adenopathy.  Urinary bladder appears normal.  There is a large fibroid arising from the uterine fundus measuring approximately 3.9 cm.  The stomach and the small bowel loops are unremarkable.  No evidence for bowel obstruction.  The appendix is identified and appears normal.  The colon is negative.  Review of the visualized osseous structures is significant for mild scoliosis and multilevel spondylosis.  There is a  first-degree anterolisthesis of L4 on L5.  IMPRESSION:  1.  No acute findings within the abdomen or pelvis. 2.  The appendix is identified and appears normal.  Original Report Authenticated By: Rosealee Albee, M.D.   Dg Chest Port 1 View  03/09/2011  *RADIOLOGY REPORT*  Clinical Data: Body ache, headache and altered mental status.  PORTABLE CHEST - 1 VIEW  Comparison: 02/26/2011  Findings: No pulmonary infiltrate, edema or pleural fluid identified.  Heart size is normal.  Stable scoliosis of the thoracic spine.  IMPRESSION: No active disease.  Original Report Authenticated By: Reola Calkins, M.D.   Medications: Scheduled Meds:    . amLODipine  10 mg Oral Daily  . metroNIDAZOLE  2,000 mg Oral Once  . pantoprazole  40 mg Oral BID AC  . DISCONTD: acyclovir  10 mg/kg Intravenous Q8H  . DISCONTD: amoxicillin  1,000 mg Oral Q12H  . DISCONTD: cefTRIAXone (ROCEPHIN)  IV  2 g Intravenous Q12H  . DISCONTD: clarithromycin  500 mg Oral Q12H  . DISCONTD: dexamethasone  4 mg Intravenous Q6H  . DISCONTD: sodium chloride  3 mL Intravenous Q12H  . DISCONTD: vancomycin  750 mg Intravenous Q12H   Continuous Infusions:    . 0.9 % NaCl with KCl 20 mEq / L 100 mL/hr at 03/10/11 1710   PRN Meds:.acetaminophen, acetaminophen, hydrALAZINE, LORazepam, ondansetron (ZOFRAN) IV, ondansetron, oxyCODONE  Assessment/Plan: Headache (03/09/2011)/? Aseptic Menigitis Patient stated that her headache has resolved. She had LP also are negative. Discussed with infectious disease Dr. Synthia Innocent. Will discontinue all antibiotics and observe for 24-48 hours.   Altered mental status (03/09/2011) Resolved   Elevated blood pressure (03/09/2011) Blood pressure still slightly elevated. We will treat her with when necessary hydralazine  Start her on oral Norvasc. Discussed with her the importance of blood pressure control.  Back pain (03/09/2011) She states that her back pain has resolved   Nausea and vomiting (03/09/2011) She  stated that her nausea vomiting has resolved   Hyponatremia (03/09/2011) Secondary to dehydration resolved. Discontinue IV fluids..  Hypokalemia (03/09/2011) Replete   Dehydration (03/09/2011)  resolved  Trichomonas infection Flagyl 2 g times one dose  Disposition: PT consult it. Plan for discharge in 24-48 hours to  followup with neurology.    LOS: 2 days   Evoleth Nordmeyer JARRETT 03/11/2011, 5:05 PM

## 2011-03-12 ENCOUNTER — Ambulatory Visit: Payer: PRIVATE HEALTH INSURANCE | Admitting: Family Medicine

## 2011-03-12 LAB — VITAMIN B12: Vitamin B-12: 600 pg/mL (ref 211–911)

## 2011-03-12 NOTE — Progress Notes (Signed)
agree

## 2011-03-12 NOTE — Progress Notes (Signed)
Subjective: Patient states that her headache and back pain has resolved. She is alert and oriented x3. She states that she is unable to walk. Her mother is also at bedside and states that she cannot walk. Objective: Weight change:   Intake/Output Summary (Last 24 hours) at 03/12/11 1617 Last data filed at 03/12/11 0900  Gross per 24 hour  Intake    420 ml  Output   1025 ml  Net   -605 ml    Filed Vitals:   03/12/11 1612  BP: 149/89  Pulse: 94  Temp: 97.3 F (36.3 C)  Resp: 17  General: Patient appears her stated age. Cardiovascular: Regular rate rhythm. Lungs: Clear bilaterally Abdomen positive bowel sounds Extremities no edema Neuro: patient able to move all extremities she is alert and oriented x3.   Lab Results: reviewed  Micro Results: Recent Results (from the past 240 hour(s))  CULTURE, BLOOD (ROUTINE X 2)     Status: Normal (Preliminary result)   Collection Time   03/09/11  2:45 PM      Component Value Range Status Comment   Specimen Description BLOOD LEFT AC   Final    Special Requests BOTTLES DRAWN AEROBIC AND ANAEROBIC 6 CC EACH   Final    Culture  Setup Time [id number]   Final    Culture     Final    Value:        BLOOD CULTURE RECEIVED NO GROWTH TO DATE CULTURE WILL BE HELD FOR 5 DAYS BEFORE ISSUING A FINAL NEGATIVE REPORT   Report Status PENDING   Incomplete   CULTURE, BLOOD (ROUTINE X 2)     Status: Normal (Preliminary result)   Collection Time   03/09/11  3:00 PM      Component Value Range Status Comment   Specimen Description BLOOD LEFT HAND   Final    Special Requests BOTTLES DRAWN AEROBIC AND ANAEROBIC 5 CC EACH   Final    Culture  Setup Time [id number]   Final    Culture     Final    Value:        BLOOD CULTURE RECEIVED NO GROWTH TO DATE CULTURE WILL BE HELD FOR 5 DAYS BEFORE ISSUING A FINAL NEGATIVE REPORT   Report Status PENDING   Incomplete   URINE CULTURE     Status: Normal   Collection Time   03/09/11  4:43 PM      Component Value Range  Status Comment   Specimen Description URINE, CLEAN CATCH   Final    Special Requests NONE   Final    Culture  Setup Time [id number]   Final    Colony Count 25,000 COLONIES/ML   Final    Culture     Final    Value: Multiple bacterial morphotypes present, none predominant. Suggest appropriate recollection if clinically indicated.   Report Status 03/10/2011 FINAL   Final   BODY FLUID CULTURE     Status: Normal (Preliminary result)   Collection Time   03/10/11  1:03 PM      Component Value Range Status Comment   Specimen Description CSF   Final    Special Requests Normal   Final    Gram Stain     Final    Value: CYTOSPIN SLIDE NO WBC SEEN     NO ORGANISMS SEEN   Culture NO GROWTH 2 DAYS   Final    Report Status PENDING   Incomplete   FUNGUS CULTURE W SMEAR  Status: Normal (Preliminary result)   Collection Time   03/10/11  1:03 PM      Component Value Range Status Comment   Specimen Description CSF   Final    Special Requests Normal   Final    Fungal Smear NO YEAST OR FUNGAL ELEMENTS SEEN   Final    Culture CULTURE IN PROGRESS FOR FOUR WEEKS   Final    Report Status PENDING   Incomplete     Studies/Results: Dg Chest 2 View  02/26/2011  *RADIOLOGY REPORT*  Clinical Data:  Headache and vomiting  CHEST - 2 VIEW  Comparison: Chest radiograph 07/16/2007  Findings: Normal mediastinum and cardiac silhouette.  Normal pulmonary  vasculature.  No evidence of effusion, infiltrate, or pneumothorax.  No acute bony abnormality.  IMPRESSION: No acute cardiopulmonary process.  Original Report Authenticated By: Genevive Bi, M.D.   Ct Head Wo Contrast  03/09/2011  *RADIOLOGY REPORT*  Clinical Data: Altered mental status, frontal headache  CT HEAD WITHOUT CONTRAST  Technique:  Contiguous axial images were obtained from the base of the skull through the vertex without contrast.  Comparison: 02/26/2011  Findings: No skull fracture is noted.  Paranasal sinuses and mastoid air cells are unremarkable.   No intracranial hemorrhage, mass effect or midline shift.  No intra or extra-axial fluid collection.  Ventricular size is stable from prior exam.  No acute infarction.  No mass lesion is noted on this unenhanced scan.  IMPRESSION: No acute intracranial abnormality.  No significant change.  Original Report Authenticated By: Natasha Mead, M.D.   Ct Head Wo Contrast  02/26/2011  *RADIOLOGY REPORT*  Clinical Data: Headache, nausea, vomiting.  CT HEAD WITHOUT CONTRAST  Technique:  Contiguous axial images were obtained from the base of the skull through the vertex without contrast.  Comparison: None.  Findings: No acute intracranial abnormality.  Specifically, no hemorrhage, hydrocephalus, mass lesion, acute infarction, or significant intracranial injury.  No acute calvarial abnormality. Visualized paranasal sinuses and mastoids clear.  Orbital soft tissues unremarkable.  IMPRESSION: Normal study.  Original Report Authenticated By: Cyndie Chime, M.D.   US Abdomen Complete  02/26/2011  *RADIOLOGY REPORT*  Clinical Data:  Elevated liver function tests.  Nausea and vomiting.  COMPLETE ABDOMINAL ULTRASOUND 02/26/2011:  Comparison:  None.  Findings:  Gallbladder:  No shadowing gallstones or echogenic sludge.  No gallbladder wall thickening or pericholecystic fluid.  Negative sonographic Murphy's sign according to the ultrasound technologist.  Common bile duct:  Normal in caliber with maximum diameter approximating 4 mm.  Liver:  Normal size and echotexture without focal parenchymal abnormality.  Patent portal vein with hepatopetal flow.  IVC:  Patent.  Pancreas:  Normal size and echotexture without focal parenchymal abnormality.  Spleen:  Normal size and echotexture without focal parenchymal abnormality.  Right Kidney:  No hydronephrosis.  Well-preserved cortex.  Normal parenchymal echotexture.  Simple cyst arising from the upper pole measuring approximately 6.6 x 5.0 x 5.4 cm.  No significant focal parenchymal  abnormality.  No visible shadowing calculi. Approximately 12.9 cm in length.  Left Kidney:  No hydronephrosis.  Well-preserved cortex.  No shadowing calculi.  Normal size and parenchymal echotexture without focal abnormalities.  Approximately 13.2 cm in length.  Abdominal aorta:  Normal in caliber throughout its visualized course in the abdomen without significant atherosclerosis.  IMPRESSION:  1.  No abnormalities involving the liver, gallbladder, or biliary tree to explain elevated liver function tests. 2.  Approximate 6.6 cm simple cyst arising from the upper pole of  the right kidney.  No significant abnormalities.  Original Report Authenticated By: Arnell Sieving, M.D.   Ct Abdomen Pelvis W Contrast  03/08/2011  *RADIOLOGY REPORT*  Clinical Data: Abdominal pain  CT ABDOMEN AND PELVIS WITH CONTRAST  Technique:  Multidetector CT imaging of the abdomen and pelvis was performed following the standard protocol during bolus administration of intravenous contrast.  Contrast: OMNIPAQUE IOHEXOL 300 MG/ML IV SOLN  Comparison: None  Findings: The lung bases appear clear.  The spleen is normal.  There are no focal liver abnormalities identified.  Gallbladder negative.  No biliary dilatation.  Normal appearance of the common bile duct.  The pancreas is unremarkable.  The adrenal glands are both normal.  There is a large simple appearing cyst within the right kidney measuring 5.3 cm. Small hypodensity within the upper pole of the lumbar left kidney is too small to characterize measuring 6 mm, image 16.  No upper abdominal adenopathy.  There is no pelvic or inguinal adenopathy.  Urinary bladder appears normal.  There is a large fibroid arising from the uterine fundus measuring approximately 3.9 cm.  The stomach and the small bowel loops are unremarkable.  No evidence for bowel obstruction.  The appendix is identified and appears normal.  The colon is negative.  Review of the visualized osseous structures is  significant for mild scoliosis and multilevel spondylosis.  There is a first-degree anterolisthesis of L4 on L5.  IMPRESSION:  1.  No acute findings within the abdomen or pelvis. 2.  The appendix is identified and appears normal.  Original Report Authenticated By: Rosealee Albee, M.D.   Dg Chest Port 1 View  03/09/2011  *RADIOLOGY REPORT*  Clinical Data: Body ache, headache and altered mental status.  PORTABLE CHEST - 1 VIEW  Comparison: 02/26/2011  Findings: No pulmonary infiltrate, edema or pleural fluid identified.  Heart size is normal.  Stable scoliosis of the thoracic spine.  IMPRESSION: No active disease.  Original Report Authenticated By: Reola Calkins, M.D.   Medications: Scheduled Meds:    . amLODipine  10 mg Oral Daily  . metroNIDAZOLE  2,000 mg Oral Once  . pantoprazole  40 mg Oral BID AC  . DISCONTD: acyclovir  10 mg/kg Intravenous Q8H  . DISCONTD: amoxicillin  1,000 mg Oral Q12H  . DISCONTD: cefTRIAXone (ROCEPHIN)  IV  2 g Intravenous Q12H  . DISCONTD: clarithromycin  500 mg Oral Q12H  . DISCONTD: dexamethasone  4 mg Intravenous Q6H  . DISCONTD: sodium chloride  3 mL Intravenous Q12H  . DISCONTD: vancomycin  750 mg Intravenous Q12H   Continuous Infusions:    . 0.9 % NaCl with KCl 20 mEq / L 20 mL/hr (03/11/11 1703)   PRN Meds:.acetaminophen, acetaminophen, hydrALAZINE, LORazepam, ondansetron (ZOFRAN) IV, ondansetron, oxyCODONE  Assessment/Plan: Headache (03/09/2011)/? Aseptic Menigitis Patient stated that her headache has resolved. She had LP also are negative. Discussed with infectious disease Dr. Synthia Innocent. Patient is still afebrile 24 hours after antibiotic has been discontinued   Altered mental status (03/09/2011) Resolved   Elevated blood pressure (03/09/2011)  Started her on oral Norvasc. Discussed with her the importance of blood pressure control.  Back pain (03/09/2011) She states that her back pain has resolved   Nausea and vomiting (03/09/2011) She stated  that her nausea vomiting has resolved   Hyponatremia (03/09/2011) Secondary to dehydration resolved. IV fluids discontinued yesterday.  Hypokalemia (03/09/2011) Resolved  Dehydration (03/09/2011)  resolved  Trichomonas infection Flagyl 2 g times one dose yesterday  Disposition: PT consult it pending. Patient states that she is unable to walk.  Awaiting PT recommendation.    LOS: 3 days   Seema Blum JARRETT 03/12/2011, 4:17 PM

## 2011-03-12 NOTE — Evaluation (Signed)
Physical Therapy Evaluation Patient Details Name: Samantha [name] MRN: [id number] DOB: 04-May-1960 Today's Date: 03/12/2011  Problem List:  Patient Active Problem List  Diagnoses  . Headache  . Altered mental status  . Elevated blood pressure  . Back pain  . Nausea and vomiting  . Hyponatremia  . Hypokalemia  . Dehydration  . Trichomonal infection    Past Medical History: History reviewed. No pertinent past medical history. Past Surgical History: History reviewed. No pertinent past surgical history.  PT Assessment/Plan/Recommendation PT Assessment Clinical Impression Statement: Pt presented with headache and increased blood pressure with CT showing hypertensive encephalopathy.  Pt has been in hospital several times for nausea/vomiting, headache and an episode of cellulitis on LE and forehead.  Pt demonstrates severe ataxia with ambulation and when performing bed mobility.  Pt will benefit from skilled PT in the acute venue to address deficits.  PT recommends short term SNF stay for follow up therapy at D/C to increase pt safety and prevent falls.  Pt and family in agreement in order to maximize pt safety.   PT Recommendation/Assessment: Patient will need skilled PT in the acute care venue PT Problem List: Decreased coordination;Decreased mobility;Decreased balance;Decreased activity tolerance;Decreased safety awareness;Decreased knowledge of use of DME;Pain;Impaired sensation;Decreased strength Barriers to Discharge: Decreased caregiver support PT Therapy Diagnosis : Difficulty walking;Abnormality of gait;Generalized weakness;Acute pain PT Plan PT Frequency: Min 3X/week PT Treatment/Interventions: DME instruction;Gait training;Functional mobility training;Therapeutic activities;Therapeutic exercise;Balance training;Patient/family education PT Recommendation Recommendations for Other Services: OT consult Follow Up Recommendations: Skilled nursing facility Equipment Recommended:  Rolling walker with 5" wheels PT Goals  Acute Rehab PT Goals PT Goal Formulation: With patient Time For Goal Achievement: 2 weeks Pt will go Supine/Side to Sit: with modified independence PT Goal: Supine/Side to Sit - Progress: Goal set today Pt will go Sit to Supine/Side: with modified independence PT Goal: Sit to Supine/Side - Progress: Goal set today Pt will go Sit to Stand: with supervision PT Goal: Sit to Stand - Progress: Goal set today Pt will go Stand to Sit: with supervision PT Goal: Stand to Sit - Progress: Goal set today Pt will Ambulate: 51 - 150 feet;with supervision;with least restrictive assistive device PT Goal: Ambulate - Progress: Goal set today  PT Evaluation Precautions/Restrictions  Precautions Precautions: Fall Required Braces or Orthoses: No Restrictions Weight Bearing Restrictions: No Prior Functioning  Home Living Lives With: Alone Type of Home: House Home Layout: One level Home Access: Stairs to enter [employer] of Steps: 2 Home Adaptive Equipment: None Prior Function Level of Independence: Independent with basic ADLs;Independent with homemaking with ambulation;Independent with gait;Independent with transfers Driving: Yes Vocation: Full time employment Cognition Cognition Arousal/Alertness: Awake/alert Overall Cognitive Status: Appears within functional limits for tasks assessed Orientation Level: Oriented X4 Sensation/Coordination Sensation Light Touch: Appears Intact (intact in B LE, c/o numbness in B hands) Coordination Gross Motor Movements are Fluid and Coordinated: No Coordination and Movement Description: Pt demonstrates severe ataxia.  Noted jerking movements when trying to perform bed mobility.  Extremity Assessment RLE Assessment RLE Assessment: Exceptions to [facility] RLE Strength RLE Overall Strength Comments: hip flex 3/5, knee ext 4/5, knee flex 5/5, ankle DF/PF 5/5.   LLE Assessment LLE Assessment: Exceptions to  [facility] LLE Strength LLE Overall Strength Comments: Hip flex 3+/5, knee ext 5/5, knee flex 5/5, ankle DF/PF 5/5 Mobility (including Balance) Bed Mobility Bed Mobility: Yes Supine to Sit: 3: Mod assist;4: Min assist Supine to Sit Details (indicate cue type and reason): Min/Mod assist to control jerking movements  noted with bed mobility.  cues given for technique.  Transfers Transfers: Yes Sit to Stand: 3: Mod assist;With upper extremity assist;From bed Sit to Stand Details (indicate cue type and reason): Requires cues for forward weight shift and hand placement on bed.  Stand to Sit: 2: Max assist;With armrests;With upper extremity assist;To chair/3-in-1 Stand to Sit Details: Required increased assist due to severe ataxic movements at end of ambulation.  Requires verbal and manual cuing for hand placement.  Ambulation/Gait Ambulation/Gait: Yes Ambulation/Gait Assistance: 2: Max assist;3: Mod assist Ambulation/Gait Assistance Details (indicate cue type and reason): Requires verbal and tactile cuing for sequencing/technique with LEs and RW.  Provided manual cues at B hips due to pts tendency for forward flexed posture.   Ambulation Distance (Feet): 10 Feet Assistive device: Rolling walker Gait Pattern: Step-to pattern;Decreased step length - right;Decreased step length - left;Decreased stride length;Ataxic;Trunk flexed Gait velocity: decreased Stairs: No Wheelchair Mobility Wheelchair Mobility: No    Exercise    End of Session PT - End of Session Equipment Utilized During Treatment: Gait belt Activity Tolerance: Patient limited by fatigue (Limited by ataxic movements) Patient left: in chair;with call bell in reach Nurse Communication: Mobility status for transfers;Mobility status for ambulation General Behavior During Session: [facility] for tasks performed Cognition: [employer] [facility] for tasks performed  Page, Meribeth Mattes 03/12/2011, 4:20 PM

## 2011-03-13 ENCOUNTER — Ambulatory Visit: Payer: PRIVATE HEALTH INSURANCE | Admitting: Gastroenterology

## 2011-03-13 ENCOUNTER — Inpatient Hospital Stay (HOSPITAL_COMMUNITY): Payer: PRIVATE HEALTH INSURANCE

## 2011-03-13 LAB — CBC
HCT: 37 % (ref 36.0–46.0)
Hemoglobin: 12.1 g/dL (ref 12.0–15.0)
MCH: 28 pg (ref 26.0–34.0)
MCHC: 32.7 g/dL (ref 30.0–36.0)
MCV: 85.6 fL (ref 78.0–100.0)
Platelets: 281 10*3/uL (ref 150–400)
RBC: 4.32 MIL/uL (ref 3.87–5.11)
RDW: 13 % (ref 11.5–15.5)
WBC: 6.5 10*3/uL (ref 4.0–10.5)

## 2011-03-13 LAB — BASIC METABOLIC PANEL
BUN: 5 mg/dL — ABNORMAL LOW (ref 6–23)
BUN: 4 mg/dL — ABNORMAL LOW (ref 6–23)
CO2: 28 mEq/L (ref 19–32)
CO2: 26 mEq/L (ref 19–32)
Calcium: 9.1 mg/dL (ref 8.4–10.5)
Calcium: 9.2 mg/dL (ref 8.4–10.5)
Chloride: 98 mEq/L (ref 96–112)
Chloride: 100 mEq/L (ref 96–112)
Creatinine, Ser: 0.76 mg/dL (ref 0.50–1.10)
Creatinine, Ser: 0.71 mg/dL (ref 0.50–1.10)
GFR calc Af Amer: >90 mL/min (ref >90)
GFR calc Af Amer: >90 mL/min (ref >90)
GFR calc non Af Amer: >90 mL/min (ref >90)
GFR calc non Af Amer: >90 mL/min (ref >90)
Glucose, Bld: 106 mg/dL — ABNORMAL HIGH (ref 70–99)
Glucose, Bld: 113 mg/dL — ABNORMAL HIGH (ref 70–99)
Potassium: 2.6 mEq/L — CL (ref 3.5–5.1)
Potassium: 3 mEq/L — ABNORMAL LOW (ref 3.5–5.1)
Sodium: 137 mEq/L (ref 135–145)
Sodium: 138 mEq/L (ref 135–145)

## 2011-03-13 LAB — MAGNESIUM: Magnesium: 2.1 mg/dL (ref 1.5–2.5)

## 2011-03-13 LAB — FOLATE RBC: RBC Folate: 579 ng/mL (ref >=366)

## 2011-03-13 MED ORDER — GI COCKTAIL ~~LOC~~
30.0000 mL | Freq: Once | ORAL | Status: AC
  Administered 2011-03-13: 30 mL via ORAL
  Filled 2011-03-13: qty 30

## 2011-03-13 MED ORDER — PANTOPRAZOLE SODIUM 40 MG PO TBEC: 40.0000 mg | DELAYED_RELEASE_TABLET | Freq: Two times a day (BID) | ORAL | Status: DC

## 2011-03-13 MED ORDER — POTASSIUM CHLORIDE CRYS ER 20 MEQ PO TBCR
40.0000 meq | EXTENDED_RELEASE_TABLET | Freq: Once | ORAL | Status: AC
  Administered 2011-03-13: 40 meq via ORAL
  Filled 2011-03-13: qty 2

## 2011-03-13 MED ORDER — GADOBENATE DIMEGLUMINE 529 MG/ML IV SOLN
14.0000 mL | Freq: Once | INTRAVENOUS | Status: AC | PRN
  Administered 2011-03-13: 14 mL via INTRAVENOUS

## 2011-03-13 MED ORDER — POTASSIUM CHLORIDE CRYS ER 20 MEQ PO TBCR
40.0000 meq | EXTENDED_RELEASE_TABLET | ORAL | Status: AC
  Administered 2011-03-13 (×2): 40 meq via ORAL
  Filled 2011-03-13 (×2): qty 2

## 2011-03-13 NOTE — Consult Note (Signed)
Subjective: Patient is stable.  Objective: Vital signs in last 24 hours: Temp:  [97.3 F (36.3 C)-98.5 F (36.9 C)] 98.5 F (36.9 C) (02/13 1342) Pulse Rate:  [69-94] 90  (02/13 1342) Resp:  [17-19] 18  (02/13 1342) BP: (107-202)/(50-106) 113/75 mmHg (02/13 1342) SpO2:  [96 %-100 %] 100 % (02/13 1342)  Intake/Output from previous day: 02/12 0701 - 02/13 0700 In: 460 [P.O.:460] Out: 2900 [Urine:2900] Intake/Output this shift: Total I/O In: 240 [P.O.:240] Out: 950 [Urine:950]  Nutritional status: General  Neurological exam: AAO*2. No aphasia. Followed complex commands. Cranial nerves: EOMI, PERRL. Visual fields were full. Sensation to V1 through V3 areas of the face was intact and symmetric throughout. There was no facial asymmetry. Hearing to finger rub was equal and symmetrical bilaterally. Shoulder shrug was 5/5 and symmetric bilaterally. Head rotation was 5/5 bilaterally. There was no dysarthria or palatal deviation. Motor: strength was 5/5 and symmetric throughout. Sensory: was intact throughout to light touch, pinprick, vibration reduced in both lower extremities bilaterally. Coordination: finger-to-nose and heel-to-shin were intact and symmetric bilaterally. Reflexes: were trace in BR b/l and 1+ in biceps and triceps b/l and 0 at the knees and 0 at the ankles. Plantar response was downgoing bilaterally. Gait: truncal ataxia - could not stand up  Lab Results:  Basename 03/13/11 0514  WBC 6.5  HGB 12.1  HCT 37.0  PLT 281  NA 137  K 2.6*  CL 98  CO2 28  GLUCOSE 106*  BUN 5*  CREATININE 0.76  CALCIUM 9.1  LABA1C --   Medications: I have reviewed the patient's current medications.  Assessment/Plan: 51 years old woman with 6 weeks of progressive ataxia who was here for headache, nausea and vomiting and 6 weeks of ataxia - suspect demyelinating etiology 1) MRI c-spine 2) LP for oligoclonal bands, viral culture, CSF lyme 3) PT/OT 4) HgA1C, ANA, anti-Ro, anti-La, ESR,  CRP 5) Will follow   LOS: 4 days   Samantha [name]

## 2011-03-13 NOTE — Progress Notes (Signed)
Subjective: Patient feels like her headache is returning.  She also complains of problem with her stomach.  She was to see a Solicitor outpatient.  Objective: Weight change:   Intake/Output Summary (Last 24 hours) at 03/13/11 2100 Last data filed at 03/13/11 1854  Gross per 24 hour  Intake    240 ml  Output   2850 ml  Net  -2610 ml    Filed Vitals:   03/13/11 2055  BP: 150/86  Pulse: 93  Temp: 98.3 F (36.8 C)  Resp: 19  General: Patient appears her stated age. Cardiovascular: Regular rate rhythm. Lungs: Clear bilaterally Abdomen positive bowel sounds Extremities no edema Neuro: patient able to move all extremities she is alert and oriented x3.   Lab Results: reviewed  Micro Results: Recent Results (from the past 240 hour(s))  CULTURE, BLOOD (ROUTINE X 2)     Status: Normal (Preliminary result)   Collection Time   03/09/11  2:45 PM      Component Value Range Status Comment   Specimen Description BLOOD LEFT AC   Final    Special Requests BOTTLES DRAWN AEROBIC AND ANAEROBIC 6 CC EACH   Final    Culture  Setup Time [id number]   Final    Culture     Final    Value:        BLOOD CULTURE RECEIVED NO GROWTH TO DATE CULTURE WILL BE HELD FOR 5 DAYS BEFORE ISSUING A FINAL NEGATIVE REPORT   Report Status PENDING   Incomplete   CULTURE, BLOOD (ROUTINE X 2)     Status: Normal (Preliminary result)   Collection Time   03/09/11  3:00 PM      Component Value Range Status Comment   Specimen Description BLOOD LEFT HAND   Final    Special Requests BOTTLES DRAWN AEROBIC AND ANAEROBIC 5 CC EACH   Final    Culture  Setup Time [id number]   Final    Culture     Final    Value:        BLOOD CULTURE RECEIVED NO GROWTH TO DATE CULTURE WILL BE HELD FOR 5 DAYS BEFORE ISSUING A FINAL NEGATIVE REPORT   Report Status PENDING   Incomplete   URINE CULTURE     Status: Normal   Collection Time   03/09/11  4:43 PM      Component Value Range Status Comment   Specimen Description URINE,  CLEAN CATCH   Final    Special Requests NONE   Final    Culture  Setup Time [id number]   Final    Colony Count 25,000 COLONIES/ML   Final    Culture     Final    Value: Multiple bacterial morphotypes present, none predominant. Suggest appropriate recollection if clinically indicated.   Report Status 03/10/2011 FINAL   Final   BODY FLUID CULTURE     Status: Normal (Preliminary result)   Collection Time   03/10/11  1:03 PM      Component Value Range Status Comment   Specimen Description CSF   Final    Special Requests Normal   Final    Gram Stain     Final    Value: CYTOSPIN SLIDE NO WBC SEEN     NO ORGANISMS SEEN   Culture NO GROWTH 3 DAYS   Final    Report Status PENDING   Incomplete   FUNGUS CULTURE W SMEAR     Status: Normal (Preliminary result)   Collection Time  03/10/11  1:03 PM      Component Value Range Status Comment   Specimen Description CSF   Final    Special Requests Normal   Final    Fungal Smear NO YEAST OR FUNGAL ELEMENTS SEEN   Final    Culture CULTURE IN PROGRESS FOR FOUR WEEKS   Final    Report Status PENDING   Incomplete     Studies/Results: Dg Chest 2 View  02/26/2011  *RADIOLOGY REPORT*  Clinical Data:  Headache and vomiting  CHEST - 2 VIEW  Comparison: Chest radiograph 07/16/2007  Findings: Normal mediastinum and cardiac silhouette.  Normal pulmonary  vasculature.  No evidence of effusion, infiltrate, or pneumothorax.  No acute bony abnormality.  IMPRESSION: No acute cardiopulmonary process.  Original Report Authenticated By: Genevive Bi, M.D.   Ct Head Wo Contrast  03/09/2011  *RADIOLOGY REPORT*  Clinical Data: Altered mental status, frontal headache  CT HEAD WITHOUT CONTRAST  Technique:  Contiguous axial images were obtained from the base of the skull through the vertex without contrast.  Comparison: 02/26/2011  Findings: No skull fracture is noted.  Paranasal sinuses and mastoid air cells are unremarkable.  No intracranial hemorrhage, mass effect or  midline shift.  No intra or extra-axial fluid collection.  Ventricular size is stable from prior exam.  No acute infarction.  No mass lesion is noted on this unenhanced scan.  IMPRESSION: No acute intracranial abnormality.  No significant change.  Original Report Authenticated By: Natasha Mead, M.D.   Ct Head Wo Contrast  02/26/2011  *RADIOLOGY REPORT*  Clinical Data: Headache, nausea, vomiting.  CT HEAD WITHOUT CONTRAST  Technique:  Contiguous axial images were obtained from the base of the skull through the vertex without contrast.  Comparison: None.  Findings: No acute intracranial abnormality.  Specifically, no hemorrhage, hydrocephalus, mass lesion, acute infarction, or significant intracranial injury.  No acute calvarial abnormality. Visualized paranasal sinuses and mastoids clear.  Orbital soft tissues unremarkable.  IMPRESSION: Normal study.  Original Report Authenticated By: Cyndie Chime, M.D.   US Abdomen Complete  02/26/2011  *RADIOLOGY REPORT*  Clinical Data:  Elevated liver function tests.  Nausea and vomiting.  COMPLETE ABDOMINAL ULTRASOUND 02/26/2011:  Comparison:  None.  Findings:  Gallbladder:  No shadowing gallstones or echogenic sludge.  No gallbladder wall thickening or pericholecystic fluid.  Negative sonographic Murphy's sign according to the ultrasound technologist.  Common bile duct:  Normal in caliber with maximum diameter approximating 4 mm.  Liver:  Normal size and echotexture without focal parenchymal abnormality.  Patent portal vein with hepatopetal flow.  IVC:  Patent.  Pancreas:  Normal size and echotexture without focal parenchymal abnormality.  Spleen:  Normal size and echotexture without focal parenchymal abnormality.  Right Kidney:  No hydronephrosis.  Well-preserved cortex.  Normal parenchymal echotexture.  Simple cyst arising from the upper pole measuring approximately 6.6 x 5.0 x 5.4 cm.  No significant focal parenchymal abnormality.  No visible shadowing calculi.  Approximately 12.9 cm in length.  Left Kidney:  No hydronephrosis.  Well-preserved cortex.  No shadowing calculi.  Normal size and parenchymal echotexture without focal abnormalities.  Approximately 13.2 cm in length.  Abdominal aorta:  Normal in caliber throughout its visualized course in the abdomen without significant atherosclerosis.  IMPRESSION:  1.  No abnormalities involving the liver, gallbladder, or biliary tree to explain elevated liver function tests. 2.  Approximate 6.6 cm simple cyst arising from the upper pole of the right kidney.  No significant abnormalities.  Original Report  Authenticated By: Arnell Sieving, M.D.   Ct Abdomen Pelvis W Contrast  03/08/2011  *RADIOLOGY REPORT*  Clinical Data: Abdominal pain  CT ABDOMEN AND PELVIS WITH CONTRAST  Technique:  Multidetector CT imaging of the abdomen and pelvis was performed following the standard protocol during bolus administration of intravenous contrast.  Contrast: OMNIPAQUE IOHEXOL 300 MG/ML IV SOLN  Comparison: None  Findings: The lung bases appear clear.  The spleen is normal.  There are no focal liver abnormalities identified.  Gallbladder negative.  No biliary dilatation.  Normal appearance of the common bile duct.  The pancreas is unremarkable.  The adrenal glands are both normal.  There is a large simple appearing cyst within the right kidney measuring 5.3 cm. Small hypodensity within the upper pole of the lumbar left kidney is too small to characterize measuring 6 mm, image 16.  No upper abdominal adenopathy.  There is no pelvic or inguinal adenopathy.  Urinary bladder appears normal.  There is a large fibroid arising from the uterine fundus measuring approximately 3.9 cm.  The stomach and the small bowel loops are unremarkable.  No evidence for bowel obstruction.  The appendix is identified and appears normal.  The colon is negative.  Review of the visualized osseous structures is significant for mild scoliosis and multilevel  spondylosis.  There is a first-degree anterolisthesis of L4 on L5.  IMPRESSION:  1.  No acute findings within the abdomen or pelvis. 2.  The appendix is identified and appears normal.  Original Report Authenticated By: Rosealee Albee, M.D.   Dg Chest Port 1 View  03/09/2011  *RADIOLOGY REPORT*  Clinical Data: Body ache, headache and altered mental status.  PORTABLE CHEST - 1 VIEW  Comparison: 02/26/2011  Findings: No pulmonary infiltrate, edema or pleural fluid identified.  Heart size is normal.  Stable scoliosis of the thoracic spine.  IMPRESSION: No active disease.  Original Report Authenticated By: Reola Calkins, M.D.   Medications: Scheduled Meds:    . amLODipine  10 mg Oral Daily  . gi cocktail  30 mL Oral Once  . pantoprazole  40 mg Oral BID AC  . potassium chloride  40 mEq Oral Q4H  . DISCONTD: pantoprazole  40 mg Oral BID   Continuous Infusions:    . 0.9 % NaCl with KCl 20 mEq / L 20 mL/hr (03/11/11 1703)   PRN Meds:.acetaminophen, acetaminophen, hydrALAZINE, LORazepam, ondansetron (ZOFRAN) IV, ondansetron, oxyCODONE  Assessment/Plan: Headache (03/09/2011)/? Aseptic Menigitis Patient stated that her headache is coming back. She had LP that was negative. Discussed with infectious disease Dr. Synthia Innocent. Patient is still afebrile 48 hours after antibiotic has been discontinued.  Patient can follow up outpatient with Neurology   Altered mental status (03/09/2011) Resolved   Elevated blood pressure (03/09/2011)  Started her on oral Norvasc. Discussed with her the importance of blood pressure control.  Back pain (03/09/2011) She states that her back pain has resolved   Nausea and vomiting (03/09/2011) She stated that her nausea vomiting has resolved   Hyponatremia (03/09/2011) Secondary to dehydration resolved. IV fluids discontinued yesterday.  Hypokalemia (03/09/2011) Replete and check magnesium level  Dehydration (03/09/2011)  resolved  Trichomonas infection Flagyl 2 g times  one dose yesterday  Mobility Disorder/Neuropathy: Patient was evaluated  By PT and found to have balance problems.  Re-consulted Neurology.  Per Neuro rec, added Lyme titer to the CSF fluid, check Oligoclonal Bands.  Sample had to be frozen to do viral titer.  If really needed  LP has to be re-done. Check HbA1c. And SPEP.  Epigastric Pain GI coctail and PPI BID if persistent then GI consult     Disposition: PT consult it pending. Patient states that she is unable to walk.  Awaiting PT recommendation.    LOS: 4 days   Yer Castello JARRETT 03/13/2011, 9:00 PM

## 2011-03-13 NOTE — Progress Notes (Signed)
CSW met with patient to discuss SNF recommendation from physical therapy. Patient is agreeable to need for SNF and open to bed offers. FL-2 completed and faxed out. Pending bed offers.  Lavera Vandermeer C. Caelum Federici MSW, LCSW [phone]

## 2011-03-13 NOTE — Progress Notes (Signed)
CRITICAL VALUE ALERT  Critical value received:  2.6                Date of notification:  03/13/11  Time of notification:  0715  Critical value read back:yes  Nurse who received alert:  Orlean Bradford  MD notified (1st page):  Dr. Janee Morn  Time of first page0720  MD notified (2nd page):  Time of second page:  Responding MD:  Dr. Janee Morn for Dr Olena Leatherwood  Time MD responded:[id number]

## 2011-03-14 LAB — BASIC METABOLIC PANEL
BUN: 4 mg/dL — ABNORMAL LOW (ref 6–23)
CO2: 26 mEq/L (ref 19–32)
Calcium: 9.2 mg/dL (ref 8.4–10.5)
Chloride: 99 mEq/L (ref 96–112)
Creatinine, Ser: 0.68 mg/dL (ref 0.50–1.10)
GFR calc Af Amer: >90 mL/min (ref >90)
GFR calc non Af Amer: >90 mL/min (ref >90)
Glucose, Bld: 105 mg/dL — ABNORMAL HIGH (ref 70–99)
Potassium: 3.6 mEq/L (ref 3.5–5.1)
Sodium: 136 mEq/L (ref 135–145)

## 2011-03-14 LAB — BODY FLUID CULTURE
Culture: NO GROWTH
Gram Stain: NONE SEEN
Special Requests: NORMAL

## 2011-03-14 LAB — COMPREHENSIVE METABOLIC PANEL
ALT: 22 U/L (ref 0–35)
AST: 21 U/L (ref 0–37)
Albumin: 2.9 g/dL — ABNORMAL LOW (ref 3.5–5.2)
Alkaline Phosphatase: 68 U/L (ref 39–117)
BUN: 6 mg/dL (ref 6–23)
CO2: 27 mEq/L (ref 19–32)
Calcium: 9.4 mg/dL (ref 8.4–10.5)
Chloride: 99 mEq/L (ref 96–112)
Creatinine, Ser: 0.65 mg/dL (ref 0.50–1.10)
GFR calc Af Amer: >90 mL/min (ref >90)
GFR calc non Af Amer: >90 mL/min (ref >90)
Glucose, Bld: 107 mg/dL — ABNORMAL HIGH (ref 70–99)
Potassium: 3.4 mEq/L — ABNORMAL LOW (ref 3.5–5.1)
Sodium: 135 mEq/L (ref 135–145)
Total Bilirubin: 0.3 mg/dL (ref 0.3–1.2)
Total Protein: 7 g/dL (ref 6.0–8.3)

## 2011-03-14 LAB — HEMOGLOBIN A1C
Hgb A1c MFr Bld: 5.6 % (ref <5.7)
Mean Plasma Glucose: 114 mg/dL (ref <117)

## 2011-03-14 LAB — CBC
HCT: 36.6 % (ref 36.0–46.0)
Hemoglobin: 11.9 g/dL — ABNORMAL LOW (ref 12.0–15.0)
MCH: 27.9 pg (ref 26.0–34.0)
MCHC: 32.5 g/dL (ref 30.0–36.0)
MCV: 85.9 fL (ref 78.0–100.0)
Platelets: 254 10*3/uL (ref 150–400)
RBC: 4.26 MIL/uL (ref 3.87–5.11)
RDW: 13.1 % (ref 11.5–15.5)
WBC: 5.1 10*3/uL (ref 4.0–10.5)

## 2011-03-14 LAB — RPR: RPR Ser Ql: NONREACTIVE

## 2011-03-14 LAB — HIGH SENSITIVITY CRP: CRP, High Sensitivity: 1.9 mg/L

## 2011-03-14 LAB — SEDIMENTATION RATE: Sed Rate: 39 mm/hr — ABNORMAL HIGH (ref 0–22)

## 2011-03-14 MED ORDER — HYDROCODONE-ACETAMINOPHEN 5-325 MG PO TABS
1.0000 | ORAL_TABLET | Freq: Four times a day (QID) | ORAL | Status: DC | PRN
  Administered 2011-03-14 – 2011-03-17 (×8): 2 via ORAL
  Administered 2011-03-18 (×2): 1 via ORAL
  Administered 2011-03-19 (×3): 2 via ORAL
  Filled 2011-03-14 (×2): qty 2
  Filled 2011-03-14: qty 1
  Filled 2011-03-14 (×9): qty 2
  Filled 2011-03-14: qty 1
  Filled 2011-03-14: qty 2

## 2011-03-14 MED ORDER — LISINOPRIL 10 MG PO TABS
10.0000 mg | ORAL_TABLET | Freq: Every day | ORAL | Status: DC
  Administered 2011-03-14 – 2011-03-19 (×6): 10 mg via ORAL
  Filled 2011-03-14 (×6): qty 1

## 2011-03-14 MED ORDER — HYDROCHLOROTHIAZIDE 25 MG PO TABS
25.0000 mg | ORAL_TABLET | Freq: Every day | ORAL | Status: DC
Start: 2011-03-14 — End: 2011-03-14
  Filled 2011-03-14: qty 1

## 2011-03-14 NOTE — Progress Notes (Signed)
[facility] PROGRESS NOTE    SUBJECTIVE   Still feels weak in her lower extremities.  No other compaints at this time.   OBJECTIVE   Vital signs in last 24 hours: Temp:  [98.3 F (36.8 C)-98.5 F (36.9 C)] 98.5 F (36.9 C) (02/14 0603) Pulse Rate:  [72-93] 72  (02/14 0603) Resp:  [18-20] 18  (02/14 0603) BP: (104-174)/(67-102) 174/99 mmHg (02/14 0603) SpO2:  [91 %-100 %] 91 % (02/14 0603)  Intake/Output from previous day: 02/13 0701 - 02/14 0700 In: 2540.7 [P.O.:240; I.V.:2300.7] Out: 2750 [Urine:2750] Intake/Output this shift: Total I/O In: -  Out: 650 [Urine:650] Nutritional status: General  History reviewed. No pertinent past medical history.  Neurologic Exam:  Mental Status: Alert, oriented, thought content appropriate.  Speech fluent without evidence of aphasia.  Able to follow 3 step commands without difficulty. Cranial Nerves: II: visual fields grossly normal, pupils equal, round, reactive to light and accommodation III,IV, VI: ptosis not present, extraocular muscles extra-ocular motions intact bilaterally V,VII: smile symmetric, facial light touch sensation normal bilaterally VIII: hearing normal bilaterally IX,X: gag reflex present XI: trapezius strength/neck flexion strength normal bilaterally XII: tongue strength normal  Motor: Right : Upper extremity    Left:     Upper extremity 5/5 deltoid       5/5 deltoid 54/5 tricep      5/5 tricep 5/5 biceps      5/5 biceps  5/5wrist flexion     5/5 wrist flexion 5/5 wrist extension     5/5 wrist extension 5/5 hand grip      5/5 hand grip  Lower extremity     Lower extremity 5/5 hip flexor      5/5 hip flexor 5/5 hip adductors     4/5 hip adductors 5/5 hip abductors     4/5 hip abductors 5/5 quadricep      4/5 quadriceps  5/5 hamstrings     4/5 hamstrings 5/5 plantar flexion       5/5 plantar flexion 5/5 plantar extension     5/5 plantar extension Normal  Tone and decreased bulk:normal tone throughout;  Sensory: Pinprick and light touch intact throughout, bilaterally Deep Tendon Reflexes: 2+ and symmetric throughout upper extremities.  She has no patella reflexes noted and no achilles reflex noted Plantars: Right: downgoing   Left: downgoing Cerebellar: normal finger-to-nose, normal rapid alternating movements and normal heel-to-shin test normal gait and station   Lab Results: No results found for this basename: cbc, bmp, coags, chol, tri, ldl, hga1c   Lipid Panel No results found for this basename: CHOL,TRIG,HDL,CHOLHDL,VLDL,LDLCALC in the last 72 hours  Studies/Results: Mr Cervical Spine W Wo Contrast  03/14/2011  *RADIOLOGY REPORT*  Clinical Data: Progressive ataxia for 6 weeks.  Headache.  Shoulder pain.  MRI CERVICAL SPINE WITHOUT AND WITH CONTRAST  Technique:  Multiplanar and multiecho pulse sequences of the cervical spine, to include the craniocervical junction and cervicothoracic junction, were obtained according to standard protocol without and with intravenous contrast.  Contrast: 14mL MULTIHANCE GADOBENATE DIMEGLUMINE 529 MG/ML IV SOLN  Comparison: None.  Findings: The scan extends from the upper clivus through T2-3.  The cervical spinal cord is normal.  No mass lesion or myelopathy or cervical spinal stenosis.  The patient has mild degenerative disc disease  at C4-5, C5-6, and C6-7 with uncinate spurs which narrows the lateral recesses bilaterally at C5-6 and on the left C6-7.  No significant foraminal stenosis.  There is a small disc protrusion at C5-6 to the right which might affect the right C6 nerve but the patient does not report radiculopathy.  The paraspinal soft tissues are normal.  IMPRESSION:  1.  Normal cervical spinal cord. 2.  Small disc protrusion at C5-6 to the right with uncinate spurring to the right and left at C5-6. 3.  Uncinate spurring to the  left C6-7.  Original Report Authenticated By: Gwynn Burly, M.D.     Medications:     Scheduled:   . amLODipine  10 mg Oral Daily  . gi cocktail  30 mL Oral Once  . lisinopril  10 mg Oral Daily  . pantoprazole  40 mg Oral BID AC  . potassium chloride  40 mEq Oral Q4H  . potassium chloride  40 mEq Oral Once  . DISCONTD: hydrochlorothiazide  25 mg Oral Daily  . DISCONTD: pantoprazole  40 mg Oral BID    Assessment/Plan:   51 years old woman with 6 weeks of progressive ataxia who was here for headache, nausea and vomiting and 6 weeks of ataxia - continue to suspect demyelinating etiology  1) MRI c-spine shows no cord impingement or demyelinating areas 2) LP for oligoclonal bands, viral culture, CSF lyme  (pending) 3) PT/OT  4) HgA1C (5.6), ANA, anti-Ro, anti-La, ESR, CRP (pending) 5) Will follow    Felicie Morn PA-C [facility] [phone]  03/14/2011, 11:04 AM

## 2011-03-14 NOTE — Progress Notes (Signed)
Subjective: States that her headache is better, uncontrolled blood pressure this morning  Objective: Vital signs in last 24 hours: Filed Vitals:   03/13/11 1342 03/13/11 1838 03/13/11 2055 03/14/11 0603  BP: 113/75 104/67 150/86 174/99  Pulse: 90 91 93 72  Temp: 98.5 F (36.9 C) 98.3 F (36.8 C) 98.3 F (36.8 C) 98.5 F (36.9 C)  TempSrc: Oral Oral Oral Oral  Resp: 18 20 19 18   Height:      Weight:      SpO2: 100% 100% 97% 91%    Intake/Output Summary (Last 24 hours) at 03/14/11 1201 Last data filed at 03/14/11 1047  Gross per 24 hour  Intake 2300.67 ml  Output   2350 ml  Net -49.33 ml   General: Patient appears her stated age.  Cardiovascular: Regular rate rhythm.  Lungs: Clear bilaterally  Abdomen positive bowel sounds  Extremities no edema  Neuro: patient able to move all extremities she is alert and oriented x3.     Weight change:     Lab Results: Results for orders placed during the hospital encounter of 03/09/11 (from the past 24 hour(s))  BASIC METABOLIC PANEL     Status: Abnormal   Collection Time   03/13/11  3:10 PM      Component Value Range   Sodium 138  135 - 145 (mEq/L)   Potassium 3.0 (*) 3.5 - 5.1 (mEq/L)   Chloride 100  96 - 112 (mEq/L)   CO2 26  19 - 32 (mEq/L)   Glucose, Bld 113 (*) 70 - 99 (mg/dL)   BUN 4 (*) 6 - 23 (mg/dL)   Creatinine, Ser 1.61  0.50 - 1.10 (mg/dL)   Calcium 9.2  8.4 - 09.6 (mg/dL)   GFR calc non Af Amer >90  >90 (mL/min)   GFR calc Af Amer >90  >90 (mL/min)  BASIC METABOLIC PANEL     Status: Abnormal   Collection Time   03/14/11  5:05 AM      Component Value Range   Sodium 136  135 - 145 (mEq/L)   Potassium 3.6  3.5 - 5.1 (mEq/L)   Chloride 99  96 - 112 (mEq/L)   CO2 26  19 - 32 (mEq/L)   Glucose, Bld 105 (*) 70 - 99 (mg/dL)   BUN 4 (*) 6 - 23 (mg/dL)   Creatinine, Ser 0.45  0.50 - 1.10 (mg/dL)   Calcium 9.2  8.4 - 40.9 (mg/dL)   GFR calc non Af Amer >90  >90 (mL/min)   GFR calc Af Amer >90  >90 (mL/min)  CBC      Status: Abnormal   Collection Time   03/14/11  5:05 AM      Component Value Range   WBC 5.1  4.0 - 10.5 (K/uL)   RBC 4.26  3.87 - 5.11 (MIL/uL)   Hemoglobin 11.9 (*) 12.0 - 15.0 (g/dL)   HCT 81.1  91.4 - 78.2 (%)   MCV 85.9  78.0 - 100.0 (fL)   MCH 27.9  26.0 - 34.0 (pg)   MCHC 32.5  30.0 - 36.0 (g/dL)   RDW 95.6  21.3 - 08.6 (%)   Platelets 254  150 - 400 (K/uL)     Micro: Recent Results (from the past 240 hour(s))  CULTURE, BLOOD (ROUTINE X 2)     Status: Normal (Preliminary result)   Collection Time   03/09/11  2:45 PM      Component Value Range Status Comment   Specimen Description BLOOD  LEFT AC   Final    Special Requests BOTTLES DRAWN AEROBIC AND ANAEROBIC 6 CC EACH   Final    Culture  Setup Time [id number]   Final    Culture     Final    Value:        BLOOD CULTURE RECEIVED NO GROWTH TO DATE CULTURE WILL BE HELD FOR 5 DAYS BEFORE ISSUING A FINAL NEGATIVE REPORT   Report Status PENDING   Incomplete   CULTURE, BLOOD (ROUTINE X 2)     Status: Normal (Preliminary result)   Collection Time   03/09/11  3:00 PM      Component Value Range Status Comment   Specimen Description BLOOD LEFT HAND   Final    Special Requests BOTTLES DRAWN AEROBIC AND ANAEROBIC 5 CC EACH   Final    Culture  Setup Time [id number]   Final    Culture     Final    Value:        BLOOD CULTURE RECEIVED NO GROWTH TO DATE CULTURE WILL BE HELD FOR 5 DAYS BEFORE ISSUING A FINAL NEGATIVE REPORT   Report Status PENDING   Incomplete   URINE CULTURE     Status: Normal   Collection Time   03/09/11  4:43 PM      Component Value Range Status Comment   Specimen Description URINE, CLEAN CATCH   Final    Special Requests NONE   Final    Culture  Setup Time [id number]   Final    Colony Count 25,000 COLONIES/ML   Final    Culture     Final    Value: Multiple bacterial morphotypes present, none predominant. Suggest appropriate recollection if clinically indicated.   Report Status 03/10/2011 FINAL   Final   BODY  FLUID CULTURE     Status: Normal   Collection Time   03/10/11  1:03 PM      Component Value Range Status Comment   Specimen Description CSF   Final    Special Requests Normal   Final    Gram Stain     Final    Value: CYTOSPIN SLIDE NO WBC SEEN     NO ORGANISMS SEEN   Culture NO GROWTH 3 DAYS   Final    Report Status 03/14/2011 FINAL   Final   FUNGUS CULTURE W SMEAR     Status: Normal (Preliminary result)   Collection Time   03/10/11  1:03 PM      Component Value Range Status Comment   Specimen Description CSF   Final    Special Requests Normal   Final    Fungal Smear NO YEAST OR FUNGAL ELEMENTS SEEN   Final    Culture CULTURE IN PROGRESS FOR FOUR WEEKS   Final    Report Status PENDING   Incomplete     Studies/Results: Mr Cervical Spine W Wo Contrast  03/14/2011  *RADIOLOGY REPORT*  Clinical Data: Progressive ataxia for 6 weeks.  Headache.  Shoulder pain.  MRI CERVICAL SPINE WITHOUT AND WITH CONTRAST  Technique:  Multiplanar and multiecho pulse sequences of the cervical spine, to include the craniocervical junction and cervicothoracic junction, were obtained according to standard protocol without and with intravenous contrast.  Contrast: 14mL MULTIHANCE GADOBENATE DIMEGLUMINE 529 MG/ML IV SOLN  Comparison: None.  Findings: The scan extends from the upper clivus through T2-3.  The cervical spinal cord is normal.  No mass lesion or myelopathy or cervical spinal stenosis.  The patient has mild  degenerative disc disease at C4-5, C5-6, and C6-7 with uncinate spurs which narrows the lateral recesses bilaterally at C5-6 and on the left C6-7.  No significant foraminal stenosis.  There is a small disc protrusion at C5-6 to the right which might affect the right C6 nerve but the patient does not report radiculopathy.  The paraspinal soft tissues are normal.  IMPRESSION:  1.  Normal cervical spinal cord. 2.  Small disc protrusion at C5-6 to the right with uncinate spurring to the right and left at C5-6. 3.   Uncinate spurring to the  left C6-7.  Original Report Authenticated By: Gwynn Burly, M.D.    Medications:  Scheduled Meds:   . amLODipine  10 mg Oral Daily  . gi cocktail  30 mL Oral Once  . lisinopril  10 mg Oral Daily  . pantoprazole  40 mg Oral BID AC  . potassium chloride  40 mEq Oral Q4H  . potassium chloride  40 mEq Oral Once  . DISCONTD: hydrochlorothiazide  25 mg Oral Daily  . DISCONTD: pantoprazole  40 mg Oral BID   Continuous Infusions:   . 0.9 % NaCl with KCl 20 mEq / L 20 mL/hr (03/11/11 1703)   PRN Meds:.gadobenate dimeglumine, hydrALAZINE, HYDROcodone-acetaminophen, LORazepam, ondansetron (ZOFRAN) IV, ondansetron, oxyCODONE, DISCONTD: acetaminophen, DISCONTD: acetaminophen  Assessment:   Headache (03/09/2011)/? Aseptic Menigitis  Patient stated that her headache is coming back. She had LP that was negative. Discussed with infectious disease Dr. Synthia Innocent. Patient is still afebrile 48 hours after antibiotic has been discontinued. Patient can follow up outpatient with Neurology   Altered mental status (03/09/2011)  Resolved   Elevated blood pressure (03/09/2011)  Started her on oral lisinopril, continue Norvasc. Discussed with her the importance of blood pressure control.   Back pain (03/09/2011)  She states that her back pain has resolved   Nausea and vomiting (03/09/2011)  She stated that her nausea vomiting has resolved   Hyponatremia (03/09/2011)  Secondary to dehydration resolved. IV fluids discontinued yesterday.   Hypokalemia (03/09/2011)  Replete and check magnesium level   Dehydration (03/09/2011) resolved  Trichomonas infection  Flagyl 2 g times one dose yesterday   Mobility Disorder/Neuropathy:  Patient was evaluated By PT and found to have balance problems. Re-consulted Neurology. Per Neuro rec, added Lyme titer to the CSF fluid, check Oligoclonal Bands. Sample had to be frozen to do viral titer. If really needed LP has to be re-done. Check HbA1c. And  SPEP.   Epigastric Pain  GI coctail and PPI BID if persistent then GI consult  Disposition: PT consult recommends SM[facility]. Patient states that she is unable to walk. Social work consultation will be obtained    LOS: 5 days   [facility] 03/14/2011, 12:01 PM

## 2011-03-14 NOTE — Progress Notes (Signed)
Pt. Continues to have headache and back pains had 1 episode of vomiting zofran given as ordered will continue to monitor.

## 2011-03-15 LAB — CULTURE, BLOOD (ROUTINE X 2)
Culture  Setup Time: 201302091709
Culture  Setup Time: 201302091709
Culture: NO GROWTH
Culture: NO GROWTH

## 2011-03-15 LAB — ANA: Anti Nuclear Antibody(ANA): NEGATIVE

## 2011-03-15 LAB — HSV(HERPES SMPLX VRS)ABS-I+II(IGG)-CSF

## 2011-03-15 LAB — SJOGRENS SYNDROME-A EXTRACTABLE NUCLEAR ANTIBODY: SSA (Ro) (ENA) Antibody, IgG: 34 [AU]/ml — ABNORMAL HIGH

## 2011-03-15 LAB — SJOGRENS SYNDROME-B EXTRACTABLE NUCLEAR ANTIBODY: SSB (La) (ENA) Antibody, IgG: <1 AU/mL (ref <30)

## 2011-03-15 NOTE — Progress Notes (Signed)
CSW met with patient. CSW gave patient list of bed offers. She will discuss with family and CSW will follow up for choice.  Samantha [name] MSW, LCSW [phone]

## 2011-03-15 NOTE — Progress Notes (Signed)
Subjective: No complaints  Objective: Vital signs in last 24 hours: Filed Vitals:   03/14/11 0603 03/14/11 1438 03/14/11 2142 03/15/11 0537  BP: 174/99 104/68 113/76 122/73  Pulse: 72 85 94 86  Temp: 98.5 F (36.9 C) 97.5 F (36.4 C) 98 F (36.7 C) 98.1 F (36.7 C)  TempSrc: Oral Axillary Oral Oral  Resp: 18 18 19 19   Height:      Weight:      SpO2: 91% 97% 99% 100%    Intake/Output Summary (Last 24 hours) at 03/15/11 1348 Last data filed at 03/15/11 1240  Gross per 24 hour  Intake    240 ml  Output   2200 ml  Net  -1960 ml    Weight change:   General: Patient appears her stated age.  Cardiovascular: Regular rate rhythm.  Lungs: Clear bilaterally  Abdomen positive bowel sounds  Extremities no edema  Neuro: patient able to move all extremities she is alert and oriented x3.       Lab Results: No results found for this or any previous visit (from the past 24 hour(s)).   Micro: Recent Results (from the past 240 hour(s))  CULTURE, BLOOD (ROUTINE X 2)     Status: Normal   Collection Time   03/09/11  2:45 PM      Component Value Range Status Comment   Specimen Description BLOOD LEFT AC   Final    Special Requests BOTTLES DRAWN AEROBIC AND ANAEROBIC 6 CC EACH   Final    Culture  Setup Time [id number]   Final    Culture NO GROWTH 5 DAYS   Final    Report Status 03/15/2011 FINAL   Final   CULTURE, BLOOD (ROUTINE X 2)     Status: Normal   Collection Time   03/09/11  3:00 PM      Component Value Range Status Comment   Specimen Description BLOOD LEFT HAND   Final    Special Requests BOTTLES DRAWN AEROBIC AND ANAEROBIC 5 CC EACH   Final    Culture  Setup Time [id number]   Final    Culture NO GROWTH 5 DAYS   Final    Report Status 03/15/2011 FINAL   Final   URINE CULTURE     Status: Normal   Collection Time   03/09/11  4:43 PM      Component Value Range Status Comment   Specimen Description URINE, CLEAN CATCH   Final    Special Requests NONE   Final    Culture   Setup Time [id number]   Final    Colony Count 25,000 COLONIES/ML   Final    Culture     Final    Value: Multiple bacterial morphotypes present, none predominant. Suggest appropriate recollection if clinically indicated.   Report Status 03/10/2011 FINAL   Final   BODY FLUID CULTURE     Status: Normal   Collection Time   03/10/11  1:03 PM      Component Value Range Status Comment   Specimen Description CSF   Final    Special Requests Normal   Final    Gram Stain     Final    Value: CYTOSPIN SLIDE NO WBC SEEN     NO ORGANISMS SEEN   Culture NO GROWTH 3 DAYS   Final    Report Status 03/14/2011 FINAL   Final   FUNGUS CULTURE W SMEAR     Status: Normal (Preliminary result)   Collection Time  03/10/11  1:03 PM      Component Value Range Status Comment   Specimen Description CSF   Final    Special Requests Normal   Final    Fungal Smear NO YEAST OR FUNGAL ELEMENTS SEEN   Final    Culture CULTURE IN PROGRESS FOR FOUR WEEKS   Final    Report Status PENDING   Incomplete     Studies/Results: Mr Cervical Spine W Wo Contrast  03/14/2011  *RADIOLOGY REPORT*  Clinical Data: Progressive ataxia for 6 weeks.  Headache.  Shoulder pain.  MRI CERVICAL SPINE WITHOUT AND WITH CONTRAST  Technique:  Multiplanar and multiecho pulse sequences of the cervical spine, to include the craniocervical junction and cervicothoracic junction, were obtained according to standard protocol without and with intravenous contrast.  Contrast: 14mL MULTIHANCE GADOBENATE DIMEGLUMINE 529 MG/ML IV SOLN  Comparison: None.  Findings: The scan extends from the upper clivus through T2-3.  The cervical spinal cord is normal.  No mass lesion or myelopathy or cervical spinal stenosis.  The patient has mild degenerative disc disease at C4-5, C5-6, and C6-7 with uncinate spurs which narrows the lateral recesses bilaterally at C5-6 and on the left C6-7.  No significant foraminal stenosis.  There is a small disc protrusion at C5-6 to the right  which might affect the right C6 nerve but the patient does not report radiculopathy.  The paraspinal soft tissues are normal.  IMPRESSION:  1.  Normal cervical spinal cord. 2.  Small disc protrusion at C5-6 to the right with uncinate spurring to the right and left at C5-6. 3.  Uncinate spurring to the  left C6-7.  Original Report Authenticated By: Gwynn Burly, M.D.    Medications:  Scheduled Meds:   . amLODipine  10 mg Oral Daily  . lisinopril  10 mg Oral Daily  . pantoprazole  40 mg Oral BID AC   Continuous Infusions:   . 0.9 % NaCl with KCl 20 mEq / L 20 mL/hr (03/11/11 1703)   PRN Meds:.hydrALAZINE, HYDROcodone-acetaminophen, LORazepam, ondansetron (ZOFRAN) IV, ondansetron, oxyCODONE   Assessment:   Headache (03/09/2011)/? Aseptic Menigitis  Patient stated that her headache is coming back. She had LP that was negative. Discussed with infectious disease Dr. Synthia Innocent. Patient is still afebrile 48 hours after antibiotic has been discontinued. Patient can follow up outpatient with Neurology neurology recommended repeat LP yesterday 1) Waiting for LP results for CSF oligoclonal bands, lyme, viral culture  2) Awaiting auto-immune work-up  3) When LP is redone, she can go to subacute rehab providing she has neurology follow-up as oligoclonal bands will take a week to come back  Altered mental status (03/09/2011)  Resolved   Elevated blood pressure (03/09/2011)  Started her on oral lisinopril, continue Norvasc. Discussed with her the importance of blood pressure control.   Back pain (03/09/2011)  She states that her back pain has resolved   Nausea and vomiting (03/09/2011)  She stated that her nausea vomiting has resolved   Hyponatremia (03/09/2011)  Secondary to dehydration resolved. IV fluids discontinued yesterday.   Hypokalemia (03/09/2011)  Replete and check magnesium level   Dehydration (03/09/2011) resolved   Trichomonas infection  Flagyl 2 g times one dose yesterday     Mobility Disorder/Neuropathy:  Patient was evaluated By PT and found to have balance problems. Re-consulted Neurology. Per Neuro rec, added Lyme titer to the CSF fluid, check Oligoclonal Bands. Repeat lumbar puncture was done yesterday  Epigastric Pain Jenna Luo GI coctail and PPI BID  if persistent then GI consult   Disposition the discharge to skilled nursing facility possibly Monday   LOS: 6 days   [facility] 03/15/2011, 1:48 PM

## 2011-03-15 NOTE — Progress Notes (Signed)
Physical Therapy Treatment Patient Details Name: JUDEE HENNICK MRN: [id number] DOB: 07-28-1960 Today's Date: 03/15/2011  Hypertensive Encephalopy 10:00 - 10:35 2gt  PT Assessment/Plan  PT - Assessment/Plan Comments on Treatment Session: Pt highly motivated.  Assisted by OOB to amb twice.  Cont to demon excessive ataxia throughout with difficulty functionally/safely amb.  Pt plans to D/C to SNF.  Mother in room and assist by following with chair. PT Plan: Discharge plan remains appropriate Follow Up Recommendations: Skilled nursing facility Equipment Recommended: Defer to next venue PT Goals  Acute Rehab PT Goals PT Goal Formulation: With patient Pt will go Supine/Side to Sit: with modified independence PT Goal: Supine/Side to Sit - Progress: Progressing toward goal Pt will go Sit to Supine/Side: with modified independence PT Goal: Sit to Supine/Side - Progress: Progressing toward goal Pt will go Sit to Stand: with supervision PT Goal: Sit to Stand - Progress: Progressing toward goal Pt will go Stand to Sit: with supervision PT Goal: Stand to Sit - Progress: Progressing toward goal Pt will Ambulate: 51 - 150 feet;with supervision PT Goal: Ambulate - Progress: Progressing toward goal  PT Treatment Precautions/Restrictions  Precautions Precautions: Fall Required Braces or Orthoses: No Restrictions Weight Bearing Restrictions: No Mobility (including Balance) Bed Mobility Bed Mobility: Yes Supine to Sit: 3: Mod assist Supine to Sit Details (indicate cue type and reason): excessive gross motor control disfunction with severe ataxic motion Transfers Transfers: Yes Sit to Stand: 2: Max assist;From bed Sit to Stand Details (indicate cue type and reason): Near fall with first attempt.  75% VC's on proper hand placement, forward lean and upright posture as pt tends to lean anteriorly and demon poor self correction Stand to Sit: 2: Max assist;To chair/3-in-1 Stand to Sit Details:  75% VC's on proper tech, hand placement and control decend Ambulation/Gait Ambulation/Gait: Yes Ambulation/Gait Assistance: 1: +1 Total assist Ambulation/Gait Assistance Details (indicate cue type and reason): Total assist + 1 pt 65% with 100% tactile cueing and 75% VC's on sequencing and  segmental gross motor control to decrease pt's severe ataxia throughout B LU/B UE.  Pt instructed to slow movements to increase motor control.  Corrective balance instruction to decrease anterior lean.  Pt instructed NOT to get up w/o assist, HIGH FALL RISK. Ambulation Distance (Feet): 45 Feet Assistive device: Rolling walker Gait Pattern: Step-to pattern;Trunk flexed;Ataxic Gait velocity: amb twice 18' then sitting rest break, then 27" with chair following for safety. Stairs: No Corporate treasurer: No    Exercise    End of Session PT - End of Session Equipment Utilized During Treatment: Gait belt Activity Tolerance: Treatment limited secondary to medical complications (Comment) (excessive ataxia throughout) Patient left: in chair;with call bell in reach;with family/visitor present General Behavior During Session: [facility] for tasks performed Cognition: [facility] for tasks performed  Felecia Shelling  PTA [facility]  Acute  Rehab Pager     [phone]

## 2011-03-15 NOTE — Consult Note (Signed)
Subjective: Patient is stable.  Objective: Vital signs in last 24 hours: Temp:  [97.5 F (36.4 C)-98.1 F (36.7 C)] 98.1 F (36.7 C) (02/15 0537) Pulse Rate:  [85-94] 86  (02/15 0537) Resp:  [18-19] 19  (02/15 0537) BP: (104-122)/(68-76) 122/73 mmHg (02/15 0537) SpO2:  [97 %-100 %] 100 % (02/15 0537)  Intake/Output from previous day: 02/14 0701 - 02/15 0700 In: 120 [P.O.:120] Out: 2100 [Urine:2100] Intake/Output this shift: Total I/O In: 240 [P.O.:240] Out: -  Nutritional status: General  Neurological exam: AAO*2. No aphasia. Followed complex commands. Cranial nerves: EOMI, PERRL. Visual fields were full. Sensation to V1 through V3 areas of the face was intact and symmetric throughout. There was no facial asymmetry. Hearing to finger rub was equal and symmetrical bilaterally. Shoulder shrug was 5/5 and symmetric bilaterally. Head rotation was 5/5 bilaterally. There was no dysarthria or palatal deviation. Motor: strength was 5/5 and symmetric throughout. Sensory: was intact throughout to light touch, pinprick, vibration reduced in both lower extremities bilaterally. Coordination: finger-to-nose and heel-to-shin were intact and symmetric bilaterally. Reflexes: were trace in BR b/l and 1+ in biceps and triceps b/l and 0 at the knees and 0 at the ankles. Plantar response was downgoing bilaterally. Gait: truncal ataxia - could not stand up  Lab Results:  Basename 03/14/11 1326 03/14/11 0505 03/13/11 0514  WBC -- 5.1 6.5  HGB -- 11.9* 12.1  HCT -- 36.6 37.0  PLT -- 254 281  NA 135 136 --  K 3.4* 3.6 --  CL 99 99 --  CO2 27 26 --  GLUCOSE 107* 105* --  BUN 6 4* --  CREATININE 0.65 0.68 --  CALCIUM 9.4 9.2 --  LABA1C -- -- --   Studies/Results: Mr Cervical Spine W Wo Contrast  03/14/2011  *RADIOLOGY REPORT*  Clinical Data: Progressive ataxia for 6 weeks.  Headache.  Shoulder pain.  MRI CERVICAL SPINE WITHOUT AND WITH CONTRAST  Technique:  Multiplanar and multiecho pulse sequences  of the cervical spine, to include the craniocervical junction and cervicothoracic junction, were obtained according to standard protocol without and with intravenous contrast.  Contrast: 14mL MULTIHANCE GADOBENATE DIMEGLUMINE 529 MG/ML IV SOLN  Comparison: None.  Findings: The scan extends from the upper clivus through T2-3.  The cervical spinal cord is normal.  No mass lesion or myelopathy or cervical spinal stenosis.  The patient has mild degenerative disc disease at C4-5, C5-6, and C6-7 with uncinate spurs which narrows the lateral recesses bilaterally at C5-6 and on the left C6-7.  No significant foraminal stenosis.  There is a small disc protrusion at C5-6 to the right which might affect the right C6 nerve but the patient does not report radiculopathy.  The paraspinal soft tissues are normal.  IMPRESSION:  1.  Normal cervical spinal cord. 2.  Small disc protrusion at C5-6 to the right with uncinate spurring to the right and left at C5-6. 3.  Uncinate spurring to the  left C6-7.  Original Report Authenticated By: Gwynn Burly, M.D.   Medications: I have reviewed the patient's current medications.  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

## 2011-03-16 LAB — LIPASE, BLOOD: Lipase: 19 U/L (ref 11–59)

## 2011-03-16 LAB — HEPATIC FUNCTION PANEL
ALT: 32 U/L (ref 0–35)
AST: 32 U/L (ref 0–37)
Albumin: 3.3 g/dL — ABNORMAL LOW (ref 3.5–5.2)
Alkaline Phosphatase: 76 U/L (ref 39–117)
Bilirubin, Direct: 0.1 mg/dL (ref 0.0–0.3)
Indirect Bilirubin: 0.3 mg/dL (ref 0.3–0.9)
Total Bilirubin: 0.4 mg/dL (ref 0.3–1.2)
Total Protein: 7.5 g/dL (ref 6.0–8.3)

## 2011-03-16 MED ORDER — POTASSIUM CHLORIDE CRYS ER 20 MEQ PO TBCR
20.0000 meq | EXTENDED_RELEASE_TABLET | Freq: Two times a day (BID) | ORAL | Status: AC
  Administered 2011-03-16 (×2): 20 meq via ORAL
  Filled 2011-03-16 (×2): qty 1

## 2011-03-16 MED ORDER — METHOCARBAMOL 100 MG/ML IJ SOLN
500.0000 mg | Freq: Four times a day (QID) | INTRAVENOUS | Status: DC | PRN
  Administered 2011-03-16: 500 mg via INTRAVENOUS
  Filled 2011-03-16: qty 5

## 2011-03-16 MED ORDER — DEXTROSE 5 % IV SOLN
500.0000 mg | Freq: Three times a day (TID) | INTRAVENOUS | Status: DC | PRN
  Administered 2011-03-16 – 2011-03-17 (×2): 500 mg via INTRAVENOUS
  Filled 2011-03-16 (×3): qty 5

## 2011-03-16 NOTE — Progress Notes (Signed)
Subjective: Complaining of low back pain  Objective: Vital signs in last 24 hours: Filed Vitals:   03/15/11 2108 03/16/11 0656 03/16/11 0957 03/16/11 1400  BP: 144/88 115/77 116/77 126/88  Pulse: 95 88 92 103  Temp: 98.6 F (37 C) 97.4 F (36.3 C) 97.8 F (36.6 C) 98.4 F (36.9 C)  TempSrc: Oral Oral Oral Oral  Resp: 18 18 19 18   Height:      Weight:      SpO2: 98% 98% 100% 98%    Intake/Output Summary (Last 24 hours) at 03/16/11 1422 Last data filed at 03/16/11 0420  Gross per 24 hour  Intake      0 ml  Output   1200 ml  Net  -1200 ml    Weight change:   General: Patient appears her stated age.  Cardiovascular: Regular rate rhythm.  Lungs: Clear bilaterally  Abdomen positive bowel sounds  Extremities no edema  Neuro: patient able to move all extremities she is alert and oriented x3.       Lab Results: Results for orders placed during the hospital encounter of 03/09/11 (from the past 24 hour(s))  LIPASE, BLOOD     Status: Normal   Collection Time   03/16/11  9:10 AM      Component Value Range   Lipase 19  11 - 59 (U/L)  HEPATIC FUNCTION PANEL     Status: Abnormal   Collection Time   03/16/11  9:10 AM      Component Value Range   Total Protein 7.5  6.0 - 8.3 (g/dL)   Albumin 3.3 (*) 3.5 - 5.2 (g/dL)   AST 32  0 - 37 (U/L)   ALT 32  0 - 35 (U/L)   Alkaline Phosphatase 76  39 - 117 (U/L)   Total Bilirubin 0.4  0.3 - 1.2 (mg/dL)   Bilirubin, Direct 0.1  0.0 - 0.3 (mg/dL)   Indirect Bilirubin 0.3  0.3 - 0.9 (mg/dL)     Micro: Recent Results (from the past 240 hour(s))  CULTURE, BLOOD (ROUTINE X 2)     Status: Normal   Collection Time   03/09/11  2:45 PM      Component Value Range Status Comment   Specimen Description BLOOD LEFT AC   Final    Special Requests BOTTLES DRAWN AEROBIC AND ANAEROBIC 6 CC EACH   Final    Culture  Setup Time [id number]   Final    Culture NO GROWTH 5 DAYS   Final    Report Status 03/15/2011 FINAL   Final   CULTURE, BLOOD  (ROUTINE X 2)     Status: Normal   Collection Time   03/09/11  3:00 PM      Component Value Range Status Comment   Specimen Description BLOOD LEFT HAND   Final    Special Requests BOTTLES DRAWN AEROBIC AND ANAEROBIC 5 CC EACH   Final    Culture  Setup Time [id number]   Final    Culture NO GROWTH 5 DAYS   Final    Report Status 03/15/2011 FINAL   Final   URINE CULTURE     Status: Normal   Collection Time   03/09/11  4:43 PM      Component Value Range Status Comment   Specimen Description URINE, CLEAN CATCH   Final    Special Requests NONE   Final    Culture  Setup Time [id number]   Final    Colony Count 25,000 COLONIES/ML  Final    Culture     Final    Value: Multiple bacterial morphotypes present, none predominant. Suggest appropriate recollection if clinically indicated.   Report Status 03/10/2011 FINAL   Final   BODY FLUID CULTURE     Status: Normal   Collection Time   03/10/11  1:03 PM      Component Value Range Status Comment   Specimen Description CSF   Final    Special Requests Normal   Final    Gram Stain     Final    Value: CYTOSPIN SLIDE NO WBC SEEN     NO ORGANISMS SEEN   Culture NO GROWTH 3 DAYS   Final    Report Status 03/14/2011 FINAL   Final   FUNGUS CULTURE W SMEAR     Status: Normal (Preliminary result)   Collection Time   03/10/11  1:03 PM      Component Value Range Status Comment   Specimen Description CSF   Final    Special Requests Normal   Final    Fungal Smear NO YEAST OR FUNGAL ELEMENTS SEEN   Final    Culture CULTURE IN PROGRESS FOR FOUR WEEKS   Final    Report Status PENDING   Incomplete     Studies/Results: No results found.  Medications: { Scheduled Meds:   . amLODipine  10 mg Oral Daily  . lisinopril  10 mg Oral Daily  . pantoprazole  40 mg Oral BID AC  . potassium chloride  20 mEq Oral BID   Continuous Infusions:   . 0.9 % NaCl with KCl 20 mEq / L 20 mL/hr (03/11/11 1703)   PRN Meds:.hydrALAZINE, HYDROcodone-acetaminophen,  LORazepam, methocarbamol(ROBAXIN) IV, ondansetron (ZOFRAN) IV, ondansetron, oxyCODONE   Assessment:   Headache (03/09/2011)/? Aseptic Menigitis  per infectious disease Dr. Synthia Innocent.  antibiotic has been discontinued. neurology recommended repeat LP 1) Waiting for LP results for CSF oligoclonal bands, lyme, viral culture  2) Awaiting auto-immune work-up  3) When LP is redone, she can go to subacute rehab providing she has neurology follow-up as oligoclonal bands will take a week to come back   Altered mental status (03/09/2011)  Resolved   Elevated blood pressure (03/09/2011)  Started her on oral lisinopril, continue Norvasc. Discussed with her the importance of blood pressure control.   Back pain (03/09/2011)  MRI of her low back shows 1. 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  2. Mild left foraminal stenosis at L4-5 with possible irritation  of the left L4 nerve root.  3. Bilateral foraminal stenosis at L5-S1 along with mild encroach  on the right S1 nerve root in the lateral recess. We'll start her on Robaxin for pain  Nausea and vomiting (03/09/2011)  She stated that her nausea vomiting has resolved   Hyponatremia (03/09/2011)  Secondary to dehydration resolved. Resolved   Hypokalemia (03/09/2011)  Replete and check magnesium level   Dehydration (03/09/2011) resolved   Trichomonas infection  Flagyl 2 g times one dose yesterday   Mobility Disorder/Neuropathy:  Patient was evaluated By PT and found to have balance problems. Re-consulted Neurology. Per Neuro rec, added Lyme titer to the CSF fluid, check Oligoclonal Bands.   Epigastric Pain Jenna Luo  GI coctail and PPI BID if persistent then GI consult   Disposition the discharge to skilled nursing facility possibly Monday        LOS: 7 days   [facility] 03/16/2011, 2:22 PM

## 2011-03-17 MED ORDER — OXYCODONE HCL 5 MG PO TABS
10.0000 mg | ORAL_TABLET | Freq: Four times a day (QID) | ORAL | Status: DC | PRN
  Administered 2011-03-17 – 2011-03-19 (×4): 10 mg via ORAL
  Filled 2011-03-17 (×4): qty 2

## 2011-03-17 MED ORDER — TRAMADOL HCL 50 MG PO TABS
50.0000 mg | ORAL_TABLET | Freq: Two times a day (BID) | ORAL | Status: DC
  Administered 2011-03-17 – 2011-03-19 (×5): 50 mg via ORAL
  Filled 2011-03-17 (×7): qty 1

## 2011-03-17 MED ORDER — METHOCARBAMOL 100 MG/ML IJ SOLN
500.0000 mg | Freq: Four times a day (QID) | INTRAMUSCULAR | Status: DC | PRN
  Administered 2011-03-17: 500 mg via INTRAVENOUS
  Filled 2011-03-17 (×2): qty 5

## 2011-03-17 NOTE — Progress Notes (Signed)
Subjective: Complaining of a lot of back pain, although some improvement after starting Robaxin yesterday  Objective: Vital signs in last 24 hours: Filed Vitals:   03/16/11 0957 03/16/11 1400 03/16/11 2150 03/17/11 0600  BP: 116/77 126/88 130/84 153/94  Pulse: 92 103 94 86  Temp: 97.8 F (36.6 C) 98.4 F (36.9 C) 98.4 F (36.9 C) 98.3 F (36.8 C)  TempSrc: Oral Oral Oral Oral  Resp: 19 18 20 18   Height:      Weight:      SpO2: 100% 98% 100% 100%    Intake/Output Summary (Last 24 hours) at 03/17/11 1037 Last data filed at 03/17/11 7829  Gross per 24 hour  Intake      0 ml  Output    800 ml  Net   -800 ml    Weight change:   General: Patient appears her stated age.  Cardiovascular: Regular rate rhythm.  Lungs: Clear bilaterally  Abdomen positive bowel sounds  Extremities no edema  Neuro: patient able to move all extremities she is alert and oriented x3.       Lab Results: No results found for this or any previous visit (from the past 24 hour(s)).   Micro: Recent Results (from the past 240 hour(s))  CULTURE, BLOOD (ROUTINE X 2)     Status: Normal   Collection Time   03/09/11  2:45 PM      Component Value Range Status Comment   Specimen Description BLOOD LEFT AC   Final    Special Requests BOTTLES DRAWN AEROBIC AND ANAEROBIC 6 CC EACH   Final    Culture  Setup Time [id number]   Final    Culture NO GROWTH 5 DAYS   Final    Report Status 03/15/2011 FINAL   Final   CULTURE, BLOOD (ROUTINE X 2)     Status: Normal   Collection Time   03/09/11  3:00 PM      Component Value Range Status Comment   Specimen Description BLOOD LEFT HAND   Final    Special Requests BOTTLES DRAWN AEROBIC AND ANAEROBIC 5 CC EACH   Final    Culture  Setup Time [id number]   Final    Culture NO GROWTH 5 DAYS   Final    Report Status 03/15/2011 FINAL   Final   URINE CULTURE     Status: Normal   Collection Time   03/09/11  4:43 PM      Component Value Range Status Comment   Specimen  Description URINE, CLEAN CATCH   Final    Special Requests NONE   Final    Culture  Setup Time [id number]   Final    Colony Count 25,000 COLONIES/ML   Final    Culture     Final    Value: Multiple bacterial morphotypes present, none predominant. Suggest appropriate recollection if clinically indicated.   Report Status 03/10/2011 FINAL   Final   BODY FLUID CULTURE     Status: Normal   Collection Time   03/10/11  1:03 PM      Component Value Range Status Comment   Specimen Description CSF   Final    Special Requests Normal   Final    Gram Stain     Final    Value: CYTOSPIN SLIDE NO WBC SEEN     NO ORGANISMS SEEN   Culture NO GROWTH 3 DAYS   Final    Report Status 03/14/2011 FINAL   Final   FUNGUS CULTURE  W SMEAR     Status: Normal (Preliminary result)   Collection Time   03/10/11  1:03 PM      Component Value Range Status Comment   Specimen Description CSF   Final    Special Requests Normal   Final    Fungal Smear NO YEAST OR FUNGAL ELEMENTS SEEN   Final    Culture CULTURE IN PROGRESS FOR FOUR WEEKS   Final    Report Status PENDING   Incomplete     Studies/Results: No results found.  Medications: { Scheduled Meds:   . amLODipine  10 mg Oral Daily  . lisinopril  10 mg Oral Daily  . pantoprazole  40 mg Oral BID AC  . potassium chloride  20 mEq Oral BID  . traMADol  50 mg Oral Q12H   Continuous Infusions:   . 0.9 % NaCl with KCl 20 mEq / L 20 mL/hr (03/11/11 1703)   PRN Meds:.hydrALAZINE, HYDROcodone-acetaminophen, LORazepam, methocarbamol(ROBAXIN) IV, ondansetron (ZOFRAN) IV, ondansetron, oxyCODONE, DISCONTD: methocarbamol(ROBAXIN) IV, DISCONTD: methocarbamol(ROBAXIN) IV, DISCONTD: oxyCODONE   Assessment:   Headache (03/09/2011)/? Aseptic Menigitis  per infectious disease Dr. Synthia Innocent. antibiotic has been discontinued. neurology recommended repeat LP 1) Waiting for LP results for CSF oligoclonal bands, lyme negative, viral culture negative  2) Awaiting auto-immune  work-up  3) When LP is redone, she can go to subacute rehab providing she has neurology follow-up as oligoclonal bands will take a week to come back   Altered mental status (03/09/2011)  Resolved   Elevated blood pressure (03/09/2011)  Started her on oral lisinopril, continue Norvasc. Discussed with her the importance of blood pressure control.   Back pain (03/09/2011)  MRI of her low back shows 1. 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  2. Mild left foraminal stenosis at L4-5 with possible irritation  of the left L4 nerve root.  3. Bilateral foraminal stenosis at L5-S1 along with mild encroach  on the right S1 nerve root in the lateral recess.  Increase in Robaxin, Ultram    Nausea and vomiting (03/09/2011)  She stated that her nausea vomiting has resolved   Hyponatremia (03/09/2011)  Secondary to dehydration resolved. Resolved   Hypokalemia (03/09/2011)  Replete and check magnesium level   Dehydration (03/09/2011) resolved  Trichomonas infection  Flagyl 2 g times one dose yesterday  Mobility Disorder/Neuropathy:  Patient was evaluated By PT and found to have balance problems. Re-consulted Neurology. Per Neuro rec, added Lyme titer to the CSF fluid, check Oligoclonal Bands.  Epigastric Pain Jenna Luo  GI coctail and PPI BID if persistent then GI consult  Disposition the discharge to skilled nursing facility possibly Monday        LOS: 8 days   [facility] 03/17/2011, 10:37 AM

## 2011-03-17 NOTE — Discharge Planning (Signed)
CSW gave bed offers to patient to review.  No additional needs at this time. Discharge date has not been determined.   Ileene Hutchinson , MSW, LCSWA 03/17/2011 4:39 PM [phone]

## 2011-03-18 LAB — PROTEIN ELECTROPHORESIS, SERUM
Albumin ELP: 47 % — ABNORMAL LOW (ref 55.8–66.1)
Alpha-1-Globulin: 6.7 % — ABNORMAL HIGH (ref 2.9–4.9)
Alpha-2-Globulin: 18.7 % — ABNORMAL HIGH (ref 7.1–11.8)
Beta 2: 7.2 % — ABNORMAL HIGH (ref 3.2–6.5)
Beta Globulin: 6 % (ref 4.7–7.2)
Gamma Globulin: 14.4 % (ref 11.1–18.8)
M-Spike, %: NOT DETECTED g/dL
Total Protein ELP: 6.2 g/dL (ref 6.0–8.3)

## 2011-03-18 MED ORDER — HYDROCODONE-ACETAMINOPHEN 5-325 MG PO TABS: 1.0000 | ORAL_TABLET | Freq: Four times a day (QID) | ORAL | Status: DC | PRN

## 2011-03-18 MED ORDER — AMLODIPINE BESYLATE 10 MG PO TABS: 10.0000 mg | ORAL_TABLET | Freq: Every day | ORAL | Status: DC

## 2011-03-18 MED ORDER — TRAMADOL HCL 50 MG PO TABS: 50.0000 mg | ORAL_TABLET | Freq: Two times a day (BID) | ORAL | Status: AC

## 2011-03-18 MED ORDER — LISINOPRIL 10 MG PO TABS: 10.0000 mg | ORAL_TABLET | Freq: Every day | ORAL | Status: DC

## 2011-03-18 MED ORDER — PANTOPRAZOLE SODIUM 40 MG PO TBEC: 40.0000 mg | DELAYED_RELEASE_TABLET | Freq: Two times a day (BID) | ORAL | Status: DC

## 2011-03-18 MED ORDER — OXYCODONE HCL 10 MG PO TABS: 10.0000 mg | ORAL_TABLET | Freq: Four times a day (QID) | ORAL | Status: DC | PRN

## 2011-03-18 NOTE — Progress Notes (Signed)
Subjective: Back pain improved, completed FMLA papers   Objective: Vital signs in last 24 hours: Filed Vitals:   03/17/11 0600 03/17/11 1350 03/17/11 2208 03/18/11 0523  BP: 153/94 132/87 146/90 125/80  Pulse: 86 90 106 98  Temp: 98.3 F (36.8 C) 98.4 F (36.9 C) 97.6 F (36.4 C) 98.5 F (36.9 C)  TempSrc: Oral Oral Oral Oral  Resp: 18 18 20 20   Height:      Weight:      SpO2: 100% 100% 100% 100%    Intake/Output Summary (Last 24 hours) at 03/18/11 1419 Last data filed at 03/18/11 1045  Gross per 24 hour  Intake    360 ml  Output    850 ml  Net   -490 ml    Weight change:   General: Patient appears her stated age.  Cardiovascular: Regular rate rhythm.  Lungs: Clear bilaterally  Abdomen positive bowel sounds  Extremities no edema  Neuro: patient able to move all extremities she is alert and oriented x3.    Lab Results: No results found for this or any previous visit (from the past 24 hour(s)).   Micro: Recent Results (from the past 240 hour(s))  CULTURE, BLOOD (ROUTINE X 2)     Status: Normal   Collection Time   03/09/11  2:45 PM      Component Value Range Status Comment   Specimen Description BLOOD LEFT AC   Final    Special Requests BOTTLES DRAWN AEROBIC AND ANAEROBIC 6 CC EACH   Final    Culture  Setup Time [id number]   Final    Culture NO GROWTH 5 DAYS   Final    Report Status 03/15/2011 FINAL   Final   CULTURE, BLOOD (ROUTINE X 2)     Status: Normal   Collection Time   03/09/11  3:00 PM      Component Value Range Status Comment   Specimen Description BLOOD LEFT HAND   Final    Special Requests BOTTLES DRAWN AEROBIC AND ANAEROBIC 5 CC EACH   Final    Culture  Setup Time [id number]   Final    Culture NO GROWTH 5 DAYS   Final    Report Status 03/15/2011 FINAL   Final   URINE CULTURE     Status: Normal   Collection Time   03/09/11  4:43 PM      Component Value Range Status Comment   Specimen Description URINE, CLEAN CATCH   Final    Special Requests  NONE   Final    Culture  Setup Time [id number]   Final    Colony Count 25,000 COLONIES/ML   Final    Culture     Final    Value: Multiple bacterial morphotypes present, none predominant. Suggest appropriate recollection if clinically indicated.   Report Status 03/10/2011 FINAL   Final   BODY FLUID CULTURE     Status: Normal   Collection Time   03/10/11  1:03 PM      Component Value Range Status Comment   Specimen Description CSF   Final    Special Requests Normal   Final    Gram Stain     Final    Value: CYTOSPIN SLIDE NO WBC SEEN     NO ORGANISMS SEEN   Culture NO GROWTH 3 DAYS   Final    Report Status 03/14/2011 FINAL   Final   FUNGUS CULTURE W SMEAR     Status: Normal (Preliminary result)  Collection Time   03/10/11  1:03 PM      Component Value Range Status Comment   Specimen Description CSF   Final    Special Requests Normal   Final    Fungal Smear NO YEAST OR FUNGAL ELEMENTS SEEN   Final    Culture CULTURE IN PROGRESS FOR FOUR WEEKS   Final    Report Status PENDING   Incomplete     Studies/Results: No results found.  Medications:  Scheduled Meds:   . amLODipine  10 mg Oral Daily  . lisinopril  10 mg Oral Daily  . pantoprazole  40 mg Oral BID AC  . traMADol  50 mg Oral Q12H   Continuous Infusions:   . 0.9 % NaCl with KCl 20 mEq / L 20 mL/hr (03/18/11 0542)   PRN Meds:.hydrALAZINE, HYDROcodone-acetaminophen, LORazepam, methocarbamol(ROBAXIN) IV, ondansetron (ZOFRAN) IV, ondansetron, oxyCODONE   Assessment:   Headache (03/09/2011)/? Aseptic Menigitis  per infectious disease Dr. Synthia Innocent. antibiotic has been discontinued. neurology recommended repeat LP 1) Waiting for LP results for CSF oligoclonal bands, lyme negative, viral culture negative  2)auto-immune work-up is negative  3) When LP is redone, she can go to subacute rehab providing she has neurology follow-up as oligoclonal bands will take a week to come back   Altered mental status (03/09/2011)  Resolved     Elevated blood pressure (03/09/2011)  Started her on oral lisinopril, continue Norvasc. Discussed with her the importance of blood pressure control.   Back pain (03/09/2011)  MRI of her low back shows 1. 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  2. Mild left foraminal stenosis at L4-5 with possible irritation  of the left L4 nerve root.  3. Bilateral foraminal stenosis at L5-S1 along with mild encroach  on the right S1 nerve root in the lateral recess.  Increase in Robaxin, Ultram    Nausea and vomiting (03/09/2011)  She stated that her nausea vomiting has resolved   Hyponatremia (03/09/2011)  Secondary to dehydration resolved. Resolved   Hypokalemia (03/09/2011)  Replete and check magnesium level   Dehydration (03/09/2011) resolved   Trichomonas infection  Flagyl 2 g times one dose yesterday   Mobility Disorder/Neuropathy:  Patient was evaluated By PT and found to have balance problems. Re-consulted Neurology. Per Neuro rec, added Lyme titer to the CSF fluid, check Oligoclonal Bands.    Epigastric Pain Samantha [name]   GI coctail and PPI BID if persistent then GI consult  Disposition the discharge to skilled nursing facility possibly tomorrow         LOS: 9 days   [facility] 03/18/2011, 2:19 PM

## 2011-03-18 NOTE — Progress Notes (Addendum)
CSW met with patient and patients mother at bedside. They are requesting [facility]. CSW notified [facility] as patient is cleared for discharge and auth needs to be obtained.  Tony Granquist C. Landin Tallon MSW, [place] [phone] CSW spoke with [facility]. Unfortunately, patients insurance provides hospital coverage only. No SNF benefit. [facility] attempted to contract a one time rate for SNF however, insurance continues to refuse to provide snf coverage.  Kalee Broxton C. Ajmal Kathan MSW, LCSW [phone]

## 2011-03-19 LAB — BASIC METABOLIC PANEL
BUN: 6 mg/dL (ref 6–23)
CO2: 28 mEq/L (ref 19–32)
Calcium: 9.6 mg/dL (ref 8.4–10.5)
Chloride: 97 mEq/L (ref 96–112)
Creatinine, Ser: 0.7 mg/dL (ref 0.50–1.10)
GFR calc Af Amer: >90 mL/min (ref >90)
GFR calc non Af Amer: >90 mL/min (ref >90)
Glucose, Bld: 105 mg/dL — ABNORMAL HIGH (ref 70–99)
Potassium: 4 mEq/L (ref 3.5–5.1)
Sodium: 132 mEq/L — ABNORMAL LOW (ref 135–145)

## 2011-03-19 MED ORDER — METHOCARBAMOL 500 MG PO TABS: 500.0000 mg | ORAL_TABLET | Freq: Four times a day (QID) | ORAL | Status: DC

## 2011-03-19 MED ORDER — HYDROCODONE-ACETAMINOPHEN 5-325 MG PO TABS: 1.0000 | ORAL_TABLET | Freq: Four times a day (QID) | ORAL | Status: DC | PRN

## 2011-03-19 NOTE — Discharge Summary (Signed)
Physician Discharge Summary  Samantha [name] MRN: [id number] DOB/AGE: 1960-02-01 51 y.o.  PCP: Loreen Freud, DO, DO   Admit date: 03/09/2011 Discharge date: 03/19/2011  Discharge Diagnoses:     *Headache rule out for aseptic meningitis Possibly multiple sclerosis will come by neurology pending  Altered mental status  Elevated blood pressure  Back pain  Nausea and vomiting  Hyponatremia  Hypokalemia  Dehydration  Trichomonal infection   Medication List  As of 03/19/2011  2:08 PM   STOP taking these medications         amoxicillin-clarithromycin-lansoprazole combo pack      ibuprofen 800 MG tablet      promethazine 50 MG tablet         TAKE these medications         amLODipine 10 MG tablet   Commonly known as: NORVASC   Take 1 tablet (10 mg total) by mouth daily.      HYDROcodone-acetaminophen 5-325 MG per tablet   Commonly known as: NORCO   Take 1-2 tablets by mouth every 6 (six) hours as needed.      lisinopril 10 MG tablet   Commonly known as: PRINIVIL,ZESTRIL   Take 1 tablet (10 mg total) by mouth daily.      methocarbamol 500 MG tablet   Commonly known as: ROBAXIN   Take 1 tablet (500 mg total) by mouth 4 (four) times daily.      pantoprazole 40 MG tablet   Commonly known as: PROTONIX   Take 1 tablet (40 mg total) by mouth 2 (two) times daily before a meal.      traMADol 50 MG tablet   Commonly known as: ULTRAM   Take 1 tablet (50 mg total) by mouth every 12 (twelve) hours.            Discharge Condition: Stable Disposition: 01-Home or Self Care   Consults: Neurology  Significant Diagnostic Studies: Dg Chest 2 View  02/26/2011  *RADIOLOGY REPORT*  Clinical Data:  Headache and vomiting  CHEST - 2 VIEW  Comparison: Chest radiograph 07/16/2007  Findings: Normal mediastinum and cardiac silhouette.  Normal pulmonary  vasculature.  No evidence of effusion, infiltrate, or pneumothorax.  No acute bony abnormality.  IMPRESSION: No acute  cardiopulmonary process.  Original Report Authenticated By: Genevive Bi, M.D.   Ct Head Wo Contrast  03/09/2011  *RADIOLOGY REPORT*  Clinical Data: Altered mental status, frontal headache  CT HEAD WITHOUT CONTRAST  Technique:  Contiguous axial images were obtained from the base of the skull through the vertex without contrast.  Comparison: 02/26/2011  Findings: No skull fracture is noted.  Paranasal sinuses and mastoid air cells are unremarkable.  No intracranial hemorrhage, mass effect or midline shift.  No intra or extra-axial fluid collection.  Ventricular size is stable from prior exam.  No acute infarction.  No mass lesion is noted on this unenhanced scan.  IMPRESSION: No acute intracranial abnormality.  No significant change.  Original Report Authenticated By: Natasha Mead, M.D.   Ct Head Wo Contrast  02/26/2011  *RADIOLOGY REPORT*  Clinical Data: Headache, nausea, vomiting.  CT HEAD WITHOUT CONTRAST  Technique:  Contiguous axial images were obtained from the base of the skull through the vertex without contrast.  Comparison: None.  Findings: No acute intracranial abnormality.  Specifically, no hemorrhage, hydrocephalus, mass lesion, acute infarction, or significant intracranial injury.  No acute calvarial abnormality. Visualized paranasal sinuses and mastoids clear.  Orbital soft tissues unremarkable.  IMPRESSION: Normal study.  Original Report Authenticated By: Cyndie Chime, M.D.   Mr Laqueta Jean NW Contrast  03/10/2011  *RADIOLOGY REPORT*  Clinical Data: Headache.  Hypertension.  Altered mental status  MRI HEAD WITHOUT AND WITH CONTRAST  Technique:  Multiplanar, multiecho pulse sequences of the brain and surrounding structures were obtained according to standard protocol without and with intravenous contrast  Contrast: 13mL MULTIHANCE GADOBENATE DIMEGLUMINE 529 MG/ML IV SOLN  Comparison: CT 03/09/2011  Findings: Cortical and subcortical edema is present in the cerebellum and cerebral hemispheres  bilaterally.  This is best seen on the FLAIR sequence and T2 sequence.  This is present in the cerebellum bilaterally including the superior cerebellar vermis. Hyperintensities also noted in the occipital lobes bilaterally extending into the frontal and parietal cortex over the convexity. Diffusion weighted imaging is negative.  Normal enhancement is seen following gadolinium administration.  Ventricle size is normal.  No midline shift.  No acute infarct. Pituitary is normal in size.  Negative for intracranial hemorrhage.  IMPRESSION: Findings are most compatible with hypertensive encephalopathy with cortical and subcortical edema in the posterior circulation primarily but also extending into the middle cerebral artery territory bilaterally.  No fixed infarct is seen at this time.  Original Report Authenticated By: Camelia Phenes, M.D.   Mr Lumbar Spine Wo Contrast  03/10/2011  *RADIOLOGY REPORT*  Clinical Data: Back pain.  Marland Kitchen  MRI LUMBAR SPINE WITHOUT CONTRAST  Technique:  Multiplanar and multiecho pulse sequences of the lumbar spine were obtained without intravenous contrast.  Comparison: None  Findings: The sagittal MR images demonstrate retrolisthesis of L5 due to facet disease.  No definite pars defects.  There is associated degenerative disc disease at L4-5 and L5-S1.  The remaining lumbar vertebral bodies are normally aligned.  They demonstrate normal marrow signal except for endplate reactive changes at L5-S1.  The last full intervertebral disc space is labeled L5-S1 and the conus medullaris terminates at L1.  No significant paraspinal or retroperitoneal process is identified. There is a large simple appearing right renal cyst.  L1-2:  No significant findings.  L2-3:  No significant findings.  L3-4:  No significant findings.  L4-5:  Diffuse bulging uncovered disc with mild impression on the thecal sac.  However, the spinal canal is quite generous and there is no significant spinal or lateral recess  stenosis.  There is mild left foraminal stenosis and possible irritation of the left L4 nerve root.  Minimal right foraminal encroachment also.  Moderate facet disease.  L5-S1:  Diffuse bulging degenerated uncovered disc along with osteophytic ridging creating mild mass effect on the ventral thecal sac.  There is also mild mass effect on the right S1 nerve root. Bilateral foraminal stenosis, left greater than right.  IMPRESSION:  1.  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 2.  Mild left foraminal stenosis at L4-5 with possible irritation of the left L4 nerve root. 3.  Bilateral foraminal stenosis at L5-S1 along with mild encroach on the right S1 nerve root in the lateral recess.  Original Report Authenticated By: P. Loralie Champagne, M.D.   Mr Cervical Spine W Wo Contrast  03/14/2011  *RADIOLOGY REPORT*  Clinical Data: Progressive ataxia for 6 weeks.  Headache.  Shoulder pain.  MRI CERVICAL SPINE WITHOUT AND WITH CONTRAST  Technique:  Multiplanar and multiecho pulse sequences of the cervical spine, to include the craniocervical junction and cervicothoracic junction, were obtained according to standard protocol without and with intravenous contrast.  Contrast: 14mL  MULTIHANCE GADOBENATE DIMEGLUMINE 529 MG/ML IV SOLN  Comparison: None.  Findings: The scan extends from the upper clivus through T2-3.  The cervical spinal cord is normal.  No mass lesion or myelopathy or cervical spinal stenosis.  The patient has mild degenerative disc disease at C4-5, C5-6, and C6-7 with uncinate spurs which narrows the lateral recesses bilaterally at C5-6 and on the left C6-7.  No significant foraminal stenosis.  There is a small disc protrusion at C5-6 to the right which might affect the right C6 nerve but the patient does not report radiculopathy.  The paraspinal soft tissues are normal.  IMPRESSION:  1.  Normal cervical spinal cord. 2.  Small disc protrusion at C5-6 to the right  with uncinate spurring to the right and left at C5-6. 3.  Uncinate spurring to the  left C6-7.  Original Report Authenticated By: Gwynn Burly, M.D.   US Abdomen Complete  02/26/2011  *RADIOLOGY REPORT*  Clinical Data:  Elevated liver function tests.  Nausea and vomiting.  COMPLETE ABDOMINAL ULTRASOUND 02/26/2011:  Comparison:  None.  Findings:  Gallbladder:  No shadowing gallstones or echogenic sludge.  No gallbladder wall thickening or pericholecystic fluid.  Negative sonographic Murphy's sign according to the ultrasound technologist.  Common bile duct:  Normal in caliber with maximum diameter approximating 4 mm.  Liver:  Normal size and echotexture without focal parenchymal abnormality.  Patent portal vein with hepatopetal flow.  IVC:  Patent.  Pancreas:  Normal size and echotexture without focal parenchymal abnormality.  Spleen:  Normal size and echotexture without focal parenchymal abnormality.  Right Kidney:  No hydronephrosis.  Well-preserved cortex.  Normal parenchymal echotexture.  Simple cyst arising from the upper pole measuring approximately 6.6 x 5.0 x 5.4 cm.  No significant focal parenchymal abnormality.  No visible shadowing calculi. Approximately 12.9 cm in length.  Left Kidney:  No hydronephrosis.  Well-preserved cortex.  No shadowing calculi.  Normal size and parenchymal echotexture without focal abnormalities.  Approximately 13.2 cm in length.  Abdominal aorta:  Normal in caliber throughout its visualized course in the abdomen without significant atherosclerosis.  IMPRESSION:  1.  No abnormalities involving the liver, gallbladder, or biliary tree to explain elevated liver function tests. 2.  Approximate 6.6 cm simple cyst arising from the upper pole of the right kidney.  No significant abnormalities.  Original Report Authenticated By: Arnell Sieving, M.D.   Ct Abdomen Pelvis W Contrast  03/08/2011  *RADIOLOGY REPORT*  Clinical Data: Abdominal pain  CT ABDOMEN AND PELVIS WITH CONTRAST   Technique:  Multidetector CT imaging of the abdomen and pelvis was performed following the standard protocol during bolus administration of intravenous contrast.  Contrast: OMNIPAQUE IOHEXOL 300 MG/ML IV SOLN  Comparison: None  Findings: The lung bases appear clear.  The spleen is normal.  There are no focal liver abnormalities identified.  Gallbladder negative.  No biliary dilatation.  Normal appearance of the common bile duct.  The pancreas is unremarkable.  The adrenal glands are both normal.  There is a large simple appearing cyst within the right kidney measuring 5.3 cm. Small hypodensity within the upper pole of the lumbar left kidney is too small to characterize measuring 6 mm, image 16.  No upper abdominal adenopathy.  There is no pelvic or inguinal adenopathy.  Urinary bladder appears normal.  There is a large fibroid arising from the uterine fundus measuring approximately 3.9 cm.  The stomach and the small bowel loops are unremarkable.  No evidence for  bowel obstruction.  The appendix is identified and appears normal.  The colon is negative.  Review of the visualized osseous structures is significant for mild scoliosis and multilevel spondylosis.  There is a first-degree anterolisthesis of L4 on L5.  IMPRESSION:  1.  No acute findings within the abdomen or pelvis. 2.  The appendix is identified and appears normal.  Original Report Authenticated By: Rosealee Albee, M.D.   Dg Chest Port 1 View  03/09/2011  *RADIOLOGY REPORT*  Clinical Data: Body ache, headache and altered mental status.  PORTABLE CHEST - 1 VIEW  Comparison: 02/26/2011  Findings: No pulmonary infiltrate, edema or pleural fluid identified.  Heart size is normal.  Stable scoliosis of the thoracic spine.  IMPRESSION: No active disease.  Original Report Authenticated By: Reola Calkins, M.D.   Dg Fluoro Guide Ndl Plc/bx  03/10/2011  *RADIOLOGY REPORT*  Clinical Data:  Altered mental status and headache.  DIAGNOSTIC LUMBAR  PUNCTURE UNDER FLUOROSCOPIC GUIDANCE  Fluoroscopy time:  0.5 minutes.  Technique:  Informed consent was obtained from the patient prior to the procedure, including potential complications of headache, allergy, and pain.   With the patient prone, the lower back was prepped with Betadine.  1% Lidocaine was used for local anesthesia. Lumbar puncture was performed at the L3 level using a 20 gauge needle with return of clear CSF with an opening pressure of 08-10 cm water.   16 ml of CSF were obtained for laboratory studies.  The patient tolerated the procedure well and there were no apparent complications.  IMPRESSION: Lumbar puncture performed under fluoroscopic guidance.  Original Report Authenticated By: Reola Calkins, M.D.     Microbiology: Recent Results (from the past 240 hour(s))  CULTURE, BLOOD (ROUTINE X 2)     Status: Normal   Collection Time   03/09/11  2:45 PM      Component Value Range Status Comment   Specimen Description BLOOD LEFT AC   Final    Special Requests BOTTLES DRAWN AEROBIC AND ANAEROBIC 6 CC EACH   Final    Culture  Setup Time [id number]   Final    Culture NO GROWTH 5 DAYS   Final    Report Status 03/15/2011 FINAL   Final   CULTURE, BLOOD (ROUTINE X 2)     Status: Normal   Collection Time   03/09/11  3:00 PM      Component Value Range Status Comment   Specimen Description BLOOD LEFT HAND   Final    Special Requests BOTTLES DRAWN AEROBIC AND ANAEROBIC 5 CC EACH   Final    Culture  Setup Time [id number]   Final    Culture NO GROWTH 5 DAYS   Final    Report Status 03/15/2011 FINAL   Final   URINE CULTURE     Status: Normal   Collection Time   03/09/11  4:43 PM      Component Value Range Status Comment   Specimen Description URINE, CLEAN CATCH   Final    Special Requests NONE   Final    Culture  Setup Time [id number]   Final    Colony Count 25,000 COLONIES/ML   Final    Culture     Final    Value: Multiple bacterial morphotypes present, none predominant. Suggest  appropriate recollection if clinically indicated.   Report Status 03/10/2011 FINAL   Final   BODY FLUID CULTURE     Status: Normal   Collection Time   03/10/11  1:03 PM      Component Value Range Status Comment   Specimen Description CSF   Final    Special Requests Normal   Final    Gram Stain     Final    Value: CYTOSPIN SLIDE NO WBC SEEN     NO ORGANISMS SEEN   Culture NO GROWTH 3 DAYS   Final    Report Status 03/14/2011 FINAL   Final   FUNGUS CULTURE W SMEAR     Status: Normal (Preliminary result)   Collection Time   03/10/11  1:03 PM      Component Value Range Status Comment   Specimen Description CSF   Final    Special Requests Normal   Final    Fungal Smear NO YEAST OR FUNGAL ELEMENTS SEEN   Final    Culture CULTURE IN PROGRESS FOR FOUR WEEKS   Final    Report Status PENDING   Incomplete      Labs: Results for orders placed during the hospital encounter of 03/09/11 (from the past 48 hour(s))  BASIC METABOLIC PANEL     Status: Abnormal   Collection Time   03/19/11  5:00 AM      Component Value Range Comment   Sodium 132 (*) 135 - 145 (mEq/L)    Potassium 4.0  3.5 - 5.1 (mEq/L)    Chloride 97  96 - 112 (mEq/L)    CO2 28  19 - 32 (mEq/L)    Glucose, Bld 105 (*) 70 - 99 (mg/dL)    BUN 6  6 - 23 (mg/dL)    Creatinine, Ser 1.19  0.50 - 1.10 (mg/dL)    Calcium 9.6  8.4 - 10.5 (mg/dL)    GFR calc non Af Amer >90  >90 (mL/min)    GFR calc Af Amer >90  >90 (mL/min)      HPI : Samantha [name] is an 51 y.o. female who was recently hospitalized for headache, return to the emergency room today with headache involving the top of her head as well as nausea but no vomiting. There has been some stiffness of her neck as well. She's had no fever or chills. Equivocal weakness of the left side of her face was noted. She's had no change in speech. She has not experienced any change in strength of her extremities. CT scan of her head was obtained which was unremarkable. She's been taking  ibuprofen for pain.   HOSPITAL COURSE: *  #1 headache she did not have any clinical signs of acute stroke. There was a concern about aseptic meningitis. The patient had to be sent for negative. Neurology was consulted and they recommended the following workup 1) MRI c-spine  2) LP for oligoclonal bands, viral culture, CSF lyme  3) PT/OT  4) HgA1C, ANA, anti-Ro, anti-La, ESR, CRP MRI of the brain did not show any acute infarct but showed hypertensive encephalopathy SSA control antibody was positive at 34, SSA antibody was negative Oligoclonal bands are pending ANA was negative ESR was 39 Vital cultures from the CSF were negative  #2 the patient also complained of low back pain MRI of the C-spine and lumbar spine were done with results as above She was evaluated by physical and occupational therapy. Although a recommendation for SNF was made, she was discharged home with home health physical therapy occupational therapy and home health R.N. because of insurance not approving in SNF stay. FM LA papers were filled out during this hospitalization as well  #  3 hyponatremia hypokalemia thought to be secondary to dehydration, potassium was repleted Repeat electrolytes are within normal limits  Discharge Exam: Blood pressure 146/92, pulse 90, temperature 98.4 F (36.9 C), temperature source Oral, resp. rate 18, height 5\' 9"  (1.753 m), weight 68.947 kg (152 lb), SpO2 98.00%.  General: Patient appears her stated age.  Cardiovascular: Regular rate rhythm.  Lungs: Clear bilaterally  Abdomen positive bowel sounds  Extremities no edema  Neuro: patient able to move all extremities she is alert and oriented x3.     Discharge Orders    Future Orders Please Complete By Expires   Diet - low sodium heart healthy      Increase activity slowly      Call MD for:  temperature >100.4      Call MD for:  severe uncontrolled pain      Call MD for:  difficulty breathing, headache or visual disturbances          Follow-up Information    Follow up with Loreen Freud, DO .        Follow up with neurology in one to 2 weeks Signed: Richarda Overlie 03/19/2011, 2:08 PM

## 2011-03-19 NOTE — Progress Notes (Signed)
D/C instructions reviewed w/ pt and family. All questions answered, no further questions. Pt d/c in w/c by NT. Pt in stable condition, in possession of d/c instructions, scripts, and all personal belongings. Pt also in possession of walker with W/C and BSC to be delivered to her home per Cookie, CM.

## 2011-03-19 NOTE — Progress Notes (Signed)
Physical Therapy Treatment Patient Details Name: Samantha [name] MRN: [id number] DOB: 08/03/1960 Today's Date: 03/19/2011  14:15 - 14:45 1 gt  1 ta  PT Assessment/Plan  PT - Assessment/Plan Comments on Treatment Session: Insurance denied SNF placement so now pt is going home with her mother. Practiced going up/down 2 steps, gait training with SW vs RW and instructed pt/family to have pt use her wc to get around house indep and to only amb/trans when she has assistance. PT Plan: Other (comment) (Ins denied SNF so pt going home) Follow Up Recommendations: Home health PT Equipment Recommended: Standard walker;Wheelchair (measurements);3 in 1 bedside comode;Other (comment) ([facility] arranging needs) PT Goals  Acute Rehab PT Goals PT Goal Formulation: With patient Pt will go Supine/Side to Sit: with modified independence PT Goal: Supine/Side to Sit - Progress: Progressing toward goal Pt will go Sit to Supine/Side: with modified independence PT Goal: Sit to Supine/Side - Progress: Progressing toward goal Pt will go Sit to Stand: with supervision PT Goal: Sit to Stand - Progress: Progressing toward goal Pt will go Stand to Sit: with supervision PT Goal: Stand to Sit - Progress: Progressing toward goal Pt will Ambulate: 51 - 150 feet;with supervision;with standard walker PT Goal: Ambulate - Progress: Progressing toward goal  PT Treatment Precautions/Restrictions  Precautions Precautions: Fall Required Braces or Orthoses: No Restrictions Weight Bearing Restrictions: No Mobility (including Balance) Bed Mobility Bed Mobility: Yes Supine to Sit:  (MinGuard Assist) Supine to Sit Details (indicate cue type and reason): HOB elevated 45' and increased time Transfers Transfers: Yes Sit to Stand: 4: Min assist;From bed Sit to Stand Details (indicate cue type and reason): 50% VC's on hand placement to push up from bed vs pull up from RW Stand to Sit: 4: Min assist;To  chair/3-in-1 Stand to Sit Details: 50% VC's to reach back prior to sit to control desend Ambulation/Gait Ambulation/Gait: Yes Ambulation/Gait Assistance: 3: Mod assist Ambulation/Gait Assistance Details (indicate cue type and reason): Vey ataxic gross motor spastisic mvts.  75% VC's to slow gait speed to decrease spasticity.  Initially amb with RW however pt unable to control proper advancement and RW tended to get too far forward.  Switched to a standard walker to prevent such mvt and increase pt safety.  75% VC's on proper sequencing using SW and proper walker to self distance.  Mother present during session and observed.  Instructed pt and mother to have pt use her wc to get around the house indep and only amb when she has assistance.  HIGH FALL RISK  Ambulation Distance (Feet): 60 Feet Assistive device: Rolling walker;Standard walker Gait Pattern: Step-to pattern;Ataxic;Trunk flexed Gait velocity: Amb twice: RW 30' too unsteady.  SW 30' more safe but advised not to amb on her own.   Stairs: Yes Stairs Assistance: 4: Min Editor, commissioning Details (indicate cue type and reason): 25% VC's on proper sequencing and safety Stair Management Technique: Two rails Number of Stairs: 2  Wheelchair Mobility Wheelchair Mobility: No    Exercise    End of Session PT - End of Session Equipment Utilized During Treatment: Gait belt Activity Tolerance: Patient limited by fatigue;Treatment limited secondary to medical complications (Comment) (neurological deficits) Patient left: in chair;with call bell in reach;with family/visitor present Nurse Communication: Other (comment) (Pt practiced stairs and educated mother on trans/amb) General Behavior During Session: [facility] for tasks performed Cognition: [facility] for tasks performed  Felecia Shelling  PTA [facility]  Acute  Rehab Pager     [phone]

## 2011-03-19 NOTE — Progress Notes (Signed)
Spoke with pt and mother at bedside concerning discharge needs. Pt's insurance did not approve SNF at present time. Pt plan is to dc home with Home Health. Pt request walker, wheel chair and bedside commode. Pt will need HHPT/OT, HHRN for home. [facility] contracts with pt's insurance, referral given. mp

## 2011-03-26 ENCOUNTER — Ambulatory Visit (INDEPENDENT_AMBULATORY_CARE_PROVIDER_SITE_OTHER): Payer: PRIVATE HEALTH INSURANCE | Admitting: Family Medicine

## 2011-03-26 ENCOUNTER — Encounter: Payer: Self-pay | Admitting: Family Medicine

## 2011-03-26 ENCOUNTER — Encounter: Payer: Self-pay | Admitting: Gastroenterology

## 2011-03-26 ENCOUNTER — Telehealth: Payer: Self-pay | Admitting: Family Medicine

## 2011-03-26 VITALS — BP 124/72 | HR 98 | Temp 98.5°F | Wt 120.6 lb

## 2011-03-26 DIAGNOSIS — R209 Unspecified disturbances of skin sensation: Secondary | ICD-10-CM

## 2011-03-26 DIAGNOSIS — R202 Paresthesia of skin: Secondary | ICD-10-CM

## 2011-03-26 DIAGNOSIS — R4182 Altered mental status, unspecified: Secondary | ICD-10-CM

## 2011-03-26 DIAGNOSIS — B9681 Helicobacter pylori [H. pylori] as the cause of diseases classified elsewhere: Secondary | ICD-10-CM

## 2011-03-26 DIAGNOSIS — K259 Gastric ulcer, unspecified as acute or chronic, without hemorrhage or perforation: Secondary | ICD-10-CM

## 2011-03-26 DIAGNOSIS — A048 Other specified bacterial intestinal infections: Secondary | ICD-10-CM

## 2011-03-26 DIAGNOSIS — M549 Dorsalgia, unspecified: Secondary | ICD-10-CM

## 2011-03-26 DIAGNOSIS — I10 Essential (primary) hypertension: Secondary | ICD-10-CM

## 2011-03-26 NOTE — Progress Notes (Signed)
  Subjective:    Patient ID: Samantha [name], female    DOB: [birth year], 51 y.o.   MRN: [id number]  HPI Pt here for f/u hospital.  Pt is in a wheelchair d/cd on bp med, pain meds and home OT/PT .  Pt states headaches have resolved and gerd is better but no completely resolved and pt is taking protonix bid.  See d/c summary.   Review of Systems    as above  Objective:   Physical Exam  Constitutional: She is oriented to person, place, and time. She appears well-developed and well-nourished.  Cardiovascular: Normal rate.   No murmur heard. Pulmonary/Chest: Effort normal and breath sounds normal.  Neurological: She is alert and oriented to person, place, and time.       Some weakness in legs but slight.  Pt unable to walk.   + numbness and tingling in hands and feet  Psychiatric: She has a normal mood and affect. Her behavior is normal. Judgment and thought content normal.          Assessment & Plan:

## 2011-03-26 NOTE — Telephone Encounter (Signed)
Larita Fife, from [facility] called & just wanted to update [place] that the patient has not been taking BP meds. She did tell the patient to go back on the meds. Thanks

## 2011-03-26 NOTE — Telephone Encounter (Signed)
FYI

## 2011-03-26 NOTE — Assessment & Plan Note (Addendum)
On pain meds and muscle relaxers Cont pt /ot MRI reviewed ---c spine and LS spine---- refer to Neurosurgery

## 2011-03-26 NOTE — Assessment & Plan Note (Signed)
No meningitis

## 2011-03-26 NOTE — Patient Instructions (Signed)
Diet for GERD or PUD Nutrition therapy can help ease the discomfort of gastroesophageal reflux disease (GERD) and peptic ulcer disease (PUD).  HOME CARE INSTRUCTIONS   Eat your meals slowly, in a relaxed setting.   Eat 5 to 6 small meals per day.   If a food causes distress, stop eating it for a period of time.  FOODS TO AVOID  Coffee, regular or decaffeinated.   Cola beverages, regular or low calorie.   Tea, regular or decaffeinated.   Pepper.   Cocoa.   High fat foods, including meats.   Butter, margarine, hydrogenated oil (trans fats).   Peppermint or spearmint (if you have GERD).   Fruits and vegetables if not tolerated.   Alcohol.   Nicotine (smoking or chewing). This is one of the most potent stimulants to acid production in the gastrointestinal tract.   Any food that seems to aggravate your condition.  If you have questions regarding your diet, ask your caregiver or a registered dietitian. TIPS  Lying flat may make symptoms worse. Keep the head of your bed raised 6 to 9 inches (15 to 23 cm) by using a foam wedge or blocks under the legs of the bed.   Do not lay down until 3 hours after eating a meal.   Daily physical activity may help reduce symptoms.  MAKE SURE YOU:   Understand these instructions.   Will watch your condition.   Will get help right away if you are not doing well or get worse.  Document Released: 01/14/2005 Document Revised: 09/26/2010 Document Reviewed: 05/30/2008 [employer] Patient Information 2012 [employer]. 

## 2011-03-26 NOTE — Assessment & Plan Note (Signed)
Stable con't meds rto 3 months or sooner prn 

## 2011-03-28 ENCOUNTER — Other Ambulatory Visit: Payer: Self-pay | Admitting: *Deleted

## 2011-03-28 MED ORDER — HYDROCODONE-ACETAMINOPHEN 7.5-750 MG PO TABS: 1.0000 | ORAL_TABLET | Freq: Four times a day (QID) | ORAL | Status: DC | PRN

## 2011-03-28 MED ORDER — METHOCARBAMOL 500 MG PO TABS: ORAL_TABLET | ORAL | Status: DC

## 2011-03-28 NOTE — Telephone Encounter (Signed)
Methocarbamol 500 mg  1-2 po qid prn  #60 vicodin es  1 po q6h prn  #60

## 2011-03-28 NOTE — Telephone Encounter (Signed)
Pt indicated that she was given the following med in hospital and would like to get meds refilled now.last OV 03-26-11, last filled 03-19-11 #30. Please advise

## 2011-03-28 NOTE — Telephone Encounter (Signed)
Pt aware, Rx sent. 

## 2011-04-01 ENCOUNTER — Encounter: Payer: Self-pay | Admitting: *Deleted

## 2011-04-04 ENCOUNTER — Ambulatory Visit: Payer: PRIVATE HEALTH INSURANCE | Admitting: Neurology

## 2011-04-04 ENCOUNTER — Telehealth: Payer: Self-pay | Admitting: *Deleted

## 2011-04-04 NOTE — Telephone Encounter (Signed)
msg left to call the office     KP 

## 2011-04-04 NOTE — Telephone Encounter (Signed)
Pt mother called to ask if the tingling she is having in her toes and fingers has anything to do with her anemia, please advise

## 2011-04-04 NOTE — Telephone Encounter (Signed)
Please advise      KP 

## 2011-04-04 NOTE — Telephone Encounter (Signed)
Unless it has dropped low its not low enough , i don't think to cause it.

## 2011-04-05 ENCOUNTER — Telehealth: Payer: Self-pay | Admitting: Gastroenterology

## 2011-04-05 ENCOUNTER — Ambulatory Visit: Payer: PRIVATE HEALTH INSURANCE | Admitting: Gastroenterology

## 2011-04-05 ENCOUNTER — Emergency Department (HOSPITAL_COMMUNITY): Payer: PRIVATE HEALTH INSURANCE

## 2011-04-05 ENCOUNTER — Other Ambulatory Visit: Payer: Self-pay

## 2011-04-05 ENCOUNTER — Encounter (HOSPITAL_COMMUNITY): Payer: Self-pay

## 2011-04-05 ENCOUNTER — Emergency Department (HOSPITAL_COMMUNITY)
Admission: EM | Admit: 2011-04-05 | Discharge: 2011-04-05 | Disposition: A | Discharge status: Home Health | Payer: PRIVATE HEALTH INSURANCE | Attending: Emergency Medicine | Admitting: Emergency Medicine

## 2011-04-05 DIAGNOSIS — F17200 Nicotine dependence, unspecified, uncomplicated: Secondary | ICD-10-CM | POA: Insufficient documentation

## 2011-04-05 DIAGNOSIS — R202 Paresthesia of skin: Secondary | ICD-10-CM

## 2011-04-05 DIAGNOSIS — Z79899 Other long term (current) drug therapy: Secondary | ICD-10-CM | POA: Insufficient documentation

## 2011-04-05 DIAGNOSIS — R209 Unspecified disturbances of skin sensation: Secondary | ICD-10-CM | POA: Insufficient documentation

## 2011-04-05 DIAGNOSIS — I10 Essential (primary) hypertension: Secondary | ICD-10-CM | POA: Insufficient documentation

## 2011-04-05 DIAGNOSIS — R10816 Epigastric abdominal tenderness: Secondary | ICD-10-CM | POA: Insufficient documentation

## 2011-04-05 DIAGNOSIS — R109 Unspecified abdominal pain: Secondary | ICD-10-CM | POA: Insufficient documentation

## 2011-04-05 LAB — DIFFERENTIAL
Basophils Absolute: 0 10*3/uL (ref 0.0–0.1)
Basophils Relative: 1 % (ref 0–1)
Eosinophils Absolute: 0.1 10*3/uL (ref 0.0–0.7)
Eosinophils Relative: 3 % (ref 0–5)
Lymphocytes Relative: 45 % (ref 12–46)
Lymphs Abs: 1.5 10*3/uL (ref 0.7–4.0)
Monocytes Absolute: 0.3 10*3/uL (ref 0.1–1.0)
Monocytes Relative: 10 % (ref 3–12)
Neutro Abs: 1.4 10*3/uL — ABNORMAL LOW (ref 1.7–7.7)
Neutrophils Relative %: 41 % — ABNORMAL LOW (ref 43–77)

## 2011-04-05 LAB — COMPREHENSIVE METABOLIC PANEL
ALT: 16 U/L (ref 0–35)
AST: 17 U/L (ref 0–37)
Albumin: 3.5 g/dL (ref 3.5–5.2)
Alkaline Phosphatase: 55 U/L (ref 39–117)
BUN: 6 mg/dL (ref 6–23)
CO2: 25 mEq/L (ref 19–32)
Calcium: 10 mg/dL (ref 8.4–10.5)
Chloride: 105 mEq/L (ref 96–112)
Creatinine, Ser: 0.63 mg/dL (ref 0.50–1.10)
GFR calc Af Amer: >90 mL/min (ref >90)
GFR calc non Af Amer: >90 mL/min (ref >90)
Glucose, Bld: 92 mg/dL (ref 70–99)
Potassium: 3.3 mEq/L — ABNORMAL LOW (ref 3.5–5.1)
Sodium: 141 mEq/L (ref 135–145)
Total Bilirubin: 0.4 mg/dL (ref 0.3–1.2)
Total Protein: 6.7 g/dL (ref 6.0–8.3)

## 2011-04-05 LAB — CBC
HCT: 36.5 % (ref 36.0–46.0)
Hemoglobin: 12 g/dL (ref 12.0–15.0)
MCH: 28 pg (ref 26.0–34.0)
MCHC: 32.9 g/dL (ref 30.0–36.0)
MCV: 85.1 fL (ref 78.0–100.0)
Platelets: 214 10*3/uL (ref 150–400)
RBC: 4.29 MIL/uL (ref 3.87–5.11)
RDW: 13.2 % (ref 11.5–15.5)
WBC: 3.3 10*3/uL — ABNORMAL LOW (ref 4.0–10.5)

## 2011-04-05 LAB — LIPASE, BLOOD: Lipase: 22 U/L (ref 11–59)

## 2011-04-05 LAB — POCT I-STAT TROPONIN I: Troponin i, poc: 0 ng/mL (ref 0.00–0.08)

## 2011-04-05 LAB — URINALYSIS, ROUTINE W REFLEX MICROSCOPIC
Bilirubin Urine: NEGATIVE
Glucose, UA: NEGATIVE mg/dL
Hgb urine dipstick: NEGATIVE
Ketones, ur: NEGATIVE mg/dL
Leukocytes, UA: NEGATIVE
Nitrite: NEGATIVE
Protein, ur: NEGATIVE mg/dL
Specific Gravity, Urine: 1.004 — ABNORMAL LOW (ref 1.005–1.030)
Urobilinogen, UA: 0.2 mg/dL (ref 0.0–1.0)
pH: 7.5 (ref 5.0–8.0)

## 2011-04-05 LAB — POCT PREGNANCY, URINE: Preg Test, Ur: NEGATIVE

## 2011-04-05 LAB — GLUCOSE, CAPILLARY: Glucose-Capillary: 105 mg/dL — ABNORMAL HIGH (ref 70–99)

## 2011-04-05 MED ORDER — MORPHINE SULFATE 4 MG/ML IJ SOLN
4.0000 mg | Freq: Once | INTRAMUSCULAR | Status: AC
  Administered 2011-04-05: 4 mg via INTRAVENOUS
  Filled 2011-04-05: qty 1

## 2011-04-05 MED ORDER — PANTOPRAZOLE SODIUM 40 MG PO TBEC: 40.0000 mg | DELAYED_RELEASE_TABLET | Freq: Two times a day (BID) | ORAL | Status: DC

## 2011-04-05 MED ORDER — ONDANSETRON HCL 4 MG/2ML IJ SOLN
4.0000 mg | Freq: Once | INTRAMUSCULAR | Status: AC
  Administered 2011-04-05: 4 mg via INTRAVENOUS
  Filled 2011-04-05: qty 2

## 2011-04-05 NOTE — Discharge Instructions (Signed)
It is VERY important to follow up with both Dr. Laury Axon and your GI specialist for further evaluation and management of recurrent abdominal pain use ongoing pantoprazole and hydrocodone-acetaminophen as needed for pain. Follow up with neurology for further evaluation of ongoing numbness/tingling in hands and feet but return to ER for emergent changing or worsening of symptoms.   Abdominal Pain Abdominal pain can be caused by many things. Your caregiver decides the seriousness of your pain by an examination and possibly blood tests and X-rays. Many cases can be observed and treated at home. Most abdominal pain is not caused by a disease and will probably improve without treatment. However, in many cases, more time must pass before a clear cause of the pain can be found. Before that point, it may not be known if you need more testing, or if hospitalization or surgery is needed. HOME CARE INSTRUCTIONS   Do not take laxatives unless directed by your caregiver.   Take pain medicine only as directed by your caregiver.   Only take over-the-counter or prescription medicines for pain, discomfort, or fever as directed by your caregiver.   Try a clear liquid diet (broth, tea, or water) for as long as directed by your caregiver. Slowly move to a bland diet as tolerated.  SEEK IMMEDIATE MEDICAL CARE IF:   The pain does not go away.   You have a fever.   You keep throwing up (vomiting).   The pain is felt only in portions of the abdomen. Pain in the right side could possibly be appendicitis. In an adult, pain in the left lower portion of the abdomen could be colitis or diverticulitis.   You pass bloody or black tarry stools.  MAKE SURE YOU:   Understand these instructions.   Will watch your condition.   Will get help right away if you are not doing well or get worse.  Document Released: 10/24/2004 Document Revised: 01/03/2011 Document Reviewed: 09/02/2007 [facility] Patient Information 2012 [place],  [place].  Paresthesia Paresthesia is a burning or prickling feeling. This feeling can happen in any part of the body. It often happens in the hands, arms, legs, or feet. HOME CARE  Avoid drinking alcohol.   Try massage or needle therapy (acupuncture) to help with your problems.   Keep all doctor visits as told.  GET HELP RIGHT AWAY IF:   You feel weak.   You have trouble walking or moving.   You have problems speaking or seeing.   You feel confused.   You cannot control when you poop (bowel movement) or pee (urinate).   You lose feeling (numbness) after an injury.   You pass out (faint).   Your burning or prickling feeling gets worse when you walk.   You have pain, cramps, or feel dizzy.   You have a rash.  MAKE SURE YOU:   Understand these instructions.   Will watch your condition.   Will get help right away if you are not doing well or get worse.  Document Released: 12/28/2007 Document Revised: 01/03/2011 Document Reviewed: 10/05/2010 [facility] [place] [facility] Patient Information 2012 [place], [place].

## 2011-04-05 NOTE — Telephone Encounter (Signed)
Pt was scheduled for GI appt today but was unable to be seen today, had severe epigastric pain and was seen at [facility] ER. Pt scheduled to see Dr. Arlyce Dice 04/09/11@2 :30pm. Pts cousin aware of appt date and time.

## 2011-04-05 NOTE — ED Notes (Signed)
Pt was brought in by EMS from [facility] with c/o epigastric pain x 2 months with numbness to both arms and legs x 2 months. Pt denies any N/V/D nor fever

## 2011-04-05 NOTE — ED Provider Notes (Signed)
History     CSN: [id number]  Arrival date & time 04/05/11  4098   First MD Initiated Contact with Patient 04/05/11 [phone]      Chief Complaint  Patient presents with  . Abdominal Pain  . Numbness    (Consider location/radiation/quality/duration/timing/severity/associated sxs/prior treatment) HPI  Patient presents to emergency department for evaluation of a greater than two-month history of upper abdominal pain and tingling of her hands and her feet. Patient states that for greater than 2 months she'll have daily intermittent abdominal pain with severity changing from day-to-day and that she has been seen in the ER numerous times for this. Patient has also been seen by her primary care physician who has established followup with neurology and GI doctor. Patient states she has missed numerous GI appointments and was actually at GI specialist's office today in the waiting room when she had acute onset severity of epigastric pain and therefore left the gastrointestinal specialist office to come to the ER before her evaluation by the GI physician. Patient states the severity has decreased since onset. She is resting comfortably. Patient states she saw a neurosurgeon last week for evaluation of a greater than two-month history of numbness and tingling in her fingers and her toes and states that he is scheduled her for an outpatient MRI in the near future. Patient denies any skin changes or nail changes. Patient denies any associated nausea, vomiting, diarrhea, or fevers with her associated epigastric pain. She denies chest pain or shortness of breath. Symptoms were acute onset and resolved. She denies aggravating or alleviating factors. Patient states she takes daily pantoprazole and as needed daily Vicodin as good relief of pain with medication use. Patient denies hx of abdominal surgeries.   Past Medical History  Diagnosis Date  . Unspecified essential hypertension   . Backache, unspecified   .  Altered mental status   . Gastric ulcer, unspecified as acute or chronic, without mention of hemorrhage, perforation, or obstruction   . Helicobacter pylori (H. pylori)     History reviewed. No pertinent past surgical history.  Family History  Problem Relation Age of Onset  . Diabetes Sister     History  Substance Use Topics  . Smoking status: Current Everyday Smoker -- 0.0 packs/day    Types: Cigarettes  . Smokeless tobacco: Not on file  . Alcohol Use: Yes     occassional    OB History    Grav Para Term Preterm Abortions TAB SAB Ect Mult Living                  Review of Systems  All other systems reviewed and are negative.    Allergies  Review of patient's allergies indicates no known allergies.  Home Medications   Current Outpatient Rx  Name Route Sig Dispense Refill  . AMLODIPINE BESYLATE 10 MG PO TABS Oral Take 10 mg by mouth daily.    Marland Kitchen HYDROCODONE-ACETAMINOPHEN 7.5-750 MG PO TABS Oral Take 1 tablet by mouth every 6 (six) hours as needed. For pain.    Marland Kitchen LISINOPRIL 10 MG PO TABS Oral Take 10 mg by mouth daily.    Marland Kitchen METHOCARBAMOL 500 MG PO TABS Oral Take 500-1,000 mg by mouth 4 (four) times daily as needed. For muscle spasms.    Marland Kitchen PANTOPRAZOLE SODIUM 40 MG PO TBEC Oral Take 40 mg by mouth 2 (two) times daily before a meal.      BP 109/81  Pulse 90  Temp(Src) 98.6 F (37 C) (  Oral)  Resp 27  Ht 5\' 8"  (1.727 m)  Wt 142 lb (64.411 kg)  BMI 21.59 kg/m2  SpO2 100%  Physical Exam  Nursing note and vitals reviewed. Constitutional: She is oriented to person, place, and time. She appears well-developed and well-nourished. No distress.  HENT:  Head: Normocephalic and atraumatic.  Eyes: Conjunctivae are normal.  Neck: Normal range of motion. Neck supple.  Cardiovascular: Normal rate, regular rhythm, normal heart sounds and intact distal pulses.  Exam reveals no gallop and no friction rub.   No murmur heard. Pulmonary/Chest: Effort normal and breath sounds  normal. No respiratory distress. She has no wheezes. She has no rales. She exhibits no tenderness.  Abdominal: Soft. Bowel sounds are normal. She exhibits no distension and no mass. There is tenderness. There is no rebound and no guarding.       Moderate TTP of epigastric region without rigidity or peritoneal signs.   Musculoskeletal: Normal range of motion. She exhibits no edema and no tenderness.       Normal sensation of digits to light and sharp touch  Neurological: She is alert and oriented to person, place, and time. She has normal reflexes.  Skin: Skin is warm and dry. No rash noted. She is not diaphoretic. No erythema.  Psychiatric: She has a normal mood and affect.    ED Course  Procedures (including critical care time)  IV morphine and zofran   Date: 04/05/2011  Rate: 94  Rhythm: normal sinus rhythm  QRS Axis: normal. LVH  Intervals: normal  ST/T Wave abnormalities: non specific T wave changes, flipped Twaves  Conduction Disutrbances: none  Narrative Interpretation:   Old EKG Reviewed: non provocative and no significant changes noted compared to Mar 09, 2011     Labs Reviewed  CBC - Abnormal; Notable for the following:    WBC 3.3 (*)    All other components within normal limits  DIFFERENTIAL - Abnormal; Notable for the following:    Neutrophils Relative 41 (*)    Neutro Abs 1.4 (*)    All other components within normal limits  COMPREHENSIVE METABOLIC PANEL - Abnormal; Notable for the following:    Potassium 3.3 (*)    All other components within normal limits  URINALYSIS, ROUTINE W REFLEX MICROSCOPIC - Abnormal; Notable for the following:    Specific Gravity, Urine 1.004 (*)    All other components within normal limits  GLUCOSE, CAPILLARY - Abnormal; Notable for the following:    Glucose-Capillary 105 (*)    All other components within normal limits  LIPASE, BLOOD  POCT PREGNANCY, URINE  POCT I-STAT TROPONIN I   Dg Abd Acute W/chest  04/05/2011  *RADIOLOGY  REPORT*  Clinical Data: Abdominal pain  ACUTE ABDOMEN SERIES (ABDOMEN 2 VIEW & CHEST 1 VIEW)  Comparison: 03/08/2011  Findings: Upper normal heart size.  Clear lungs.  No pneumothorax.  No free intraperitoneal gas.  No disproportionate dilatation of bowel.  Nonspecific lower abdominal air fluid levels.  Pelvic phleboliths.  IMPRESSION: Nonobstructive bowel gas pattern.  No active cardiopulmonary disease.  Original Report Authenticated By: Donavan Burnet, M.D.     1. Abdominal pain   2. Paresthesias       MDM  Patient has a greater than two-month history of recurrent episodic epigastric pain without any peritoneal signs and a benign appearing abdomen. No acute findings on labs or and no acute findings on chest x-ray. Patient has no history of abdominal surgeries and no signs or symptoms worrisome for  small bowel structure any other acute process in abdomen at this time. Patient has been given followup with the GI specialist and neurologist for further specialized management of her ongoing issues. Spoke at length with patient about the importance of following up with the GI doctor for further evaluation and management and with her primary care physician. I spoke at length about changing or worsening symptoms it would warrent return to the emergency department. Patient voices her understanding and is agreeable to plan. Patient's pain is now well controlled.        Jenness Corner, [place] 04/05/11 1218

## 2011-04-05 NOTE — ED Provider Notes (Signed)
Discussed with me, has already had thorough evaluations for neurological problems, was in her GI office today and left to come to the ED.   Medical screening examination/treatment/procedure(s) were performed by non-physician practitioner and as supervising physician I was immediately available for consultation/collaboration. Devoria Albe, MD, Armando Gang   Ward Givens, MD 04/05/11 [phone]

## 2011-04-05 NOTE — Telephone Encounter (Signed)
Addended by: Arnette Norris on: 04/05/2011 02:25 PM   Modules accepted: Orders

## 2011-04-05 NOTE — Telephone Encounter (Signed)
Discussed with sister.   KP

## 2011-04-05 NOTE — ED Notes (Signed)
Pt was brought in by EMS from the [facility] with c/o epigastric pain worse today. Pt claimed that she has been having this symptoms for 2 months. Pt also is c/o numbness to both feet and arms x 2 months. Pt is A/A/Ox4, skin is warm and dry, respiration is even and unlabored. Family is with the patient

## 2011-04-08 ENCOUNTER — Other Ambulatory Visit (HOSPITAL_COMMUNITY): Payer: Self-pay | Admitting: Neurosurgery

## 2011-04-08 DIAGNOSIS — R262 Difficulty in walking, not elsewhere classified: Secondary | ICD-10-CM

## 2011-04-09 ENCOUNTER — Encounter: Payer: Self-pay | Admitting: Gastroenterology

## 2011-04-09 ENCOUNTER — Ambulatory Visit (INDEPENDENT_AMBULATORY_CARE_PROVIDER_SITE_OTHER): Payer: PRIVATE HEALTH INSURANCE | Admitting: Gastroenterology

## 2011-04-09 DIAGNOSIS — G610 Guillain-Barre syndrome: Secondary | ICD-10-CM | POA: Insufficient documentation

## 2011-04-09 DIAGNOSIS — R279 Unspecified lack of coordination: Secondary | ICD-10-CM

## 2011-04-09 DIAGNOSIS — R270 Ataxia, unspecified: Secondary | ICD-10-CM

## 2011-04-09 DIAGNOSIS — R1013 Epigastric pain: Secondary | ICD-10-CM | POA: Insufficient documentation

## 2011-04-09 NOTE — Assessment & Plan Note (Signed)
This patient has persistent chest or upper abdominal pain coincident with onset of ataxia. Exam is remarkable for exquisite tenderness in the subxiphoid area, especially with abdominal muscle wall flexion. At the same time symptoms have slightly improved with Protonix. She's been taking Robaxin for several weeks. Visceral pain from ulcer or nonulcer dyspepsia is a possibility, especially in view of her history of ulcer disease. Musculoskeletal pain is a definite concern as well.  Recommendations #1 upper endoscopy #2 continue Protonix #3 if endoscopy is negative I would consider a trial of anti-inflammatory medications for what may be musculoskeletal pain

## 2011-04-09 NOTE — Patient Instructions (Addendum)
You have been given a separate informational sheet regarding your tobacco use, the importance of quitting and local resources to help you quit. You have been scheduled for an endoscopy with propofol. Please follow written instructions given to you at your visit today.  

## 2011-04-09 NOTE — Progress Notes (Signed)
History of Present Illness:  Samantha [name] is a 51 year old average female referred at the request of Dr. Randa Lynn for evaluation of abdominal and chest pain. Over the past 2 months she developed progressive ataxia. She was admitted to the hospital in early February for severe headaches and ataxia. She has a history of alcohol abuse. It was felt that she may have a peripheral neuropathy and ataxia related to alcohol use or due to a demyelinating disorder. Over the last 4-6 weeks she has been complaining of chest pain. She has moderate to severe pain that is unrelated toeating or bowel movements. Pain is without radiation. She denies dysphagia although feels that food lodges in her upper abdomen. Pain is slightly improved with protonix.  Abnormal LFTs in January, 2013 prompted an ultrasound  and abdominal CT in February, 2012.  Both  were unrevealing. LFTs have subsequently normalized.    She apparantly has a history of a gastric ulcer, according to her chart, although she denies this. She is on no gastric irritants including nonsteroidals.    Past Medical History  Diagnosis Date  . Unspecified essential hypertension   . Backache, unspecified   . Altered mental status   . Gastric ulcer, unspecified as acute or chronic, without mention of hemorrhage, perforation, or obstruction   . Helicobacter pylori (H. pylori)    History reviewed. No pertinent past surgical history. family history includes Diabetes in her sister. Current Outpatient Prescriptions  Medication Sig Dispense Refill  . amLODipine (NORVASC) 10 MG tablet Take 10 mg by mouth daily.      Marland Kitchen lisinopril (PRINIVIL,ZESTRIL) 10 MG tablet Take 10 mg by mouth daily.      . methocarbamol (ROBAXIN) 500 MG tablet Take 500-1,000 mg by mouth 4 (four) times daily as needed. For muscle spasms.      . pantoprazole (PROTONIX) 40 MG tablet Take 1 tablet (40 mg total) by mouth 2 (two) times daily.  60 tablet  1   Allergies as of 04/09/2011 - Review Complete  04/09/2011  Allergen Reaction Noted  . Tramadol  04/09/2011    reports that she has been smoking Cigarettes.  She has been smoking about 0 packs per day. She has never used smokeless tobacco. She reports that she drinks alcohol. She reports that she does not use illicit drugs.     Review of Systems: She is unable to walk because of numbness in her toes and hands and weakness of her legs Pertinent positive and negative review of systems were noted in the above HPI section. All other review of systems were otherwise negative.  Vital signs were reviewed in today's medical record Physical Exam: General: Well developed , well nourished, no acute distress Head: Normocephalic and atraumatic Eyes:  sclerae anicteric, EOMI Ears: Normal auditory acuity Mouth: No deformity or lesions Neck: Supple, no masses or thyromegaly Lungs: Clear throughout to auscultation Heart: Regular rate and rhythm; no murmurs, rubs or bruits Abdomen: Soft,and non distended. No masses, hepatosplenomegaly or hernias noted. Normal Bowel sounds; there is moderate tenderness to palpation in the subxiphoid area. Tendon is clearly is worsened with abdominal muscle wall flexion Rectal:deferred Musculoskeletal: Symmetrical with no gross deformities  Skin: No lesions on visible extremities Pulses:  Normal pulses noted Extremities: No clubbing, cyanosis, edema or deformities noted Neurological: Alert oriented x 4; movements are ataxic Cervical Nodes:  No significant cervical adenopathy Inguinal Nodes: No significant inguinal adenopathy Psychological:  Alert and cooperative. Normal mood and affect

## 2011-04-10 ENCOUNTER — Ambulatory Visit (AMBULATORY_SURGERY_CENTER): Payer: PRIVATE HEALTH INSURANCE | Admitting: Gastroenterology

## 2011-04-10 ENCOUNTER — Inpatient Hospital Stay (HOSPITAL_COMMUNITY): Admission: RE | Admit: 2011-04-10 | Payer: PRIVATE HEALTH INSURANCE | Source: Ambulatory Visit

## 2011-04-10 ENCOUNTER — Encounter: Payer: Self-pay | Admitting: Gastroenterology

## 2011-04-10 VITALS — BP 159/98 | HR 98 | Temp 97.1°F | Resp 26 | Ht 68.0 in | Wt 128.0 lb

## 2011-04-10 DIAGNOSIS — K209 Esophagitis, unspecified without bleeding: Secondary | ICD-10-CM

## 2011-04-10 DIAGNOSIS — B3781 Candidal esophagitis: Secondary | ICD-10-CM

## 2011-04-10 DIAGNOSIS — R1013 Epigastric pain: Secondary | ICD-10-CM

## 2011-04-10 MED ORDER — SULINDAC 200 MG PO TABS: 200.0000 mg | ORAL_TABLET | Freq: Two times a day (BID) | ORAL | Status: DC

## 2011-04-10 MED ORDER — SODIUM CHLORIDE 0.9 % IV SOLN: 500.0000 mL | INTRAVENOUS | Status: DC

## 2011-04-10 NOTE — Patient Instructions (Signed)
Await biopsy results.  Per Dr. Arlyce Dice trial of clinoril 200mg  2 x per day and also continue protonix.  You may resume your prior medications today.  Call if any questions or concerns.   YOU HAD AN ENDOSCOPIC PROCEDURE TODAY AT THE [facility]: Refer to the procedure report that was given to you for any specific questions about what was found during the examination.  If the procedure report does not answer your questions, please call your gastroenterologist to clarify.  If you requested that your care partner not be given the details of your procedure findings, then the procedure report has been included in a sealed envelope for you to review at your convenience later.  YOU SHOULD EXPECT: Some feelings of bloating in the abdomen. Passage of more gas than usual.  Walking can help get rid of the air that was put into your GI tract during the procedure and reduce the bloating. If you had a lower endoscopy (such as a colonoscopy or flexible sigmoidoscopy) you may notice spotting of blood in your stool or on the toilet paper. If you underwent a bowel prep for your procedure, then you may not have a normal bowel movement for a few days.  DIET: Your first meal following the procedure should be a light meal and then it is ok to progress to your normal diet.  A half-sandwich or bowl of soup is an example of a good first meal.  Heavy or fried foods are harder to digest and may make you feel nauseous or bloated.  Likewise meals heavy in dairy and vegetables can cause extra gas to form and this can also increase the bloating.  Drink plenty of fluids but you should avoid alcoholic beverages for 24 hours.  ACTIVITY: Your care partner should take you home directly after the procedure.  You should plan to take it easy, moving slowly for the rest of the day.  You can resume normal activity the day after the procedure however you should NOT DRIVE or use heavy machinery for 24 hours (because of the sedation  medicines used during the test).    SYMPTOMS TO REPORT IMMEDIATELY: A gastroenterologist can be reached at any hour.  During normal business hours, 8:30 AM to 5:00 PM Monday through Friday, call [phone].  After hours and on weekends, please call the GI answering service at [phone] who will take a message and have the physician on call contact you.     Following upper endoscopy (EGD)  Vomiting of blood or coffee ground material  New chest pain or pain under the shoulder blades  Painful or persistently difficult swallowing  New shortness of breath  Fever of 100F or higher  Black, tarry-looking stools  FOLLOW UP: If any biopsies were taken you will be contacted by phone or by letter within the next 1-3 weeks.  Call your gastroenterologist if you have not heard about the biopsies in 3 weeks.  Our staff will call the home number listed on your records the next business day following your procedure to check on you and address any questions or concerns that you may have at that time regarding the information given to you following your procedure. This is a courtesy call and so if there is no answer at the home number and we have not heard from you through the emergency physician on call, we will assume that you have returned to your regular daily activities without incident.  SIGNATURES/CONFIDENTIALITY: You and/or your care partner  have signed paperwork which will be entered into your electronic medical record.  These signatures attest to the fact that that the information above on your After Visit Summary has been reviewed and is understood.  Full responsibility of the confidentiality of this discharge information lies with you and/or your care-partner.

## 2011-04-10 NOTE — Progress Notes (Signed)
Propofol was administered by Shon Hough, CRNA. Maw  Pt was nervous when brought into the procedure room.  She was shaking slightly.  We tried to reassure her. maw

## 2011-04-10 NOTE — Progress Notes (Signed)
No complaints noted in the recovery room. Maw  Patient did not experience any of the following events: a burn prior to discharge; a fall within the facility; wrong site/side/patient/procedure/implant event; or a hospital transfer or hospital admission upon discharge from the facility. (G8907) Patient did not have preoperative order for IV antibiotic SSI prophylaxis. (G8918)  

## 2011-04-10 NOTE — Op Note (Signed)
[facility] [address] [place], [place]  [address]  ENDOSCOPY PROCEDURE REPORT  PATIENT:  Samantha [name], Samantha [name]  MR#:  [id number] BIRTHDATE:  September 14, 1960, 50 yrs. old  GENDER:  female  ENDOSCOPIST:  Barbette Hair. Arlyce Dice, MD Referred by:  Loreen Freud, DO  PROCEDURE DATE:  04/10/2011 PROCEDURE:  EGD with biopsy, [id number] ASA CLASS:  Class II INDICATIONS:  chest pain  MEDICATIONS:   MAC sedation, administered by CRNA propofol 120mg IV, glycopyrrolate (Robinal) 0.2 mg IV, 0.6cc simethancone 0.6 cc PO TOPICAL ANESTHETIC:  DESCRIPTION OF PROCEDURE:   After the risks and benefits of the procedure were explained, informed consent was obtained.  The [facility] GIF-H180 [id number] endoscope was introduced through the mouth and advanced to the third portion of the duodenum.  The instrument was slowly withdrawn as the mucosa was fully examined. <<PROCEDUREIMAGES>>  Candida esophagitis. Multiple adherent white plaques. Bxs taken to r/o candida esophagitis  Otherwise the examination was normal (see image1 and image2).    Retroflexed views revealed no abnormalities.    The scope was then withdrawn from the patient and the procedure completed.  COMPLICATIONS:  None  ENDOSCOPIC IMPRESSION: 1) Possible Candida esophagitis 2) Otherwise normal examination RECOMMENDATIONS: 1) Await biopsy results 2) trial of clinoril 200mg  bid; continue protonix  ______________________________ Barbette Hair. Arlyce Dice, MD  CC:  n. eSIGNED:   Barbette Hair. Cortnee Steinmiller at 04/10/2011 03:24 PM  Jola Babinski, [phone]

## 2011-04-10 NOTE — Progress Notes (Signed)
The pt tolerated the egd very well. Maw   

## 2011-04-11 ENCOUNTER — Ambulatory Visit (HOSPITAL_COMMUNITY)
Admission: RE | Admit: 2011-04-11 | Discharge: 2011-04-11 | Disposition: A | Discharge status: Home / Self Care | Payer: PRIVATE HEALTH INSURANCE | Source: Ambulatory Visit | Attending: Neurosurgery | Admitting: Neurosurgery

## 2011-04-11 ENCOUNTER — Telehealth: Payer: Self-pay | Admitting: *Deleted

## 2011-04-11 DIAGNOSIS — R51 Headache: Secondary | ICD-10-CM | POA: Insufficient documentation

## 2011-04-11 DIAGNOSIS — R4182 Altered mental status, unspecified: Secondary | ICD-10-CM | POA: Insufficient documentation

## 2011-04-11 DIAGNOSIS — I10 Essential (primary) hypertension: Secondary | ICD-10-CM | POA: Insufficient documentation

## 2011-04-11 DIAGNOSIS — R262 Difficulty in walking, not elsewhere classified: Secondary | ICD-10-CM

## 2011-04-11 MED ORDER — GADOBENATE DIMEGLUMINE 529 MG/ML IV SOLN
11.0000 mL | Freq: Once | INTRAVENOUS | Status: AC | PRN
  Administered 2011-04-11: 11 mL via INTRAVENOUS

## 2011-04-11 NOTE — Telephone Encounter (Signed)
  Follow up Call-  Call back number 04/10/2011  Post procedure Call Back phone  # [phone]  Permission to leave phone message Yes     Patient questions:  Do you have a fever, pain , or abdominal swelling? no Pain Score  0 *  Have you tolerated food without any problems? yes  Have you been able to return to your normal activities? yes  Do you have any questions about your discharge instructions: Diet   no Medications  no Follow up visit  no  Do you have questions or concerns about your Care? no  Actions: * If pain score is 4 or above: No action needed, pain <4.  Pt stated that she still feels "out of it".  Has been able to eat and get back to normal activity and has no pain or fever.  Advised her that she should take it easy today and call us back if she has additional or worsening symptoms.

## 2011-04-12 LAB — FUNGUS CULTURE W SMEAR
Fungal Smear: NONE SEEN
Special Requests: NORMAL

## 2011-04-16 ENCOUNTER — Encounter: Payer: Self-pay | Admitting: Gastroenterology

## 2011-04-16 ENCOUNTER — Other Ambulatory Visit: Payer: Self-pay

## 2011-04-16 MED ORDER — LISINOPRIL 10 MG PO TABS: 10.0000 mg | ORAL_TABLET | Freq: Every day | ORAL | Status: DC

## 2011-04-16 MED ORDER — AMLODIPINE BESYLATE 10 MG PO TABS: 10.0000 mg | ORAL_TABLET | Freq: Every day | ORAL | Status: DC

## 2011-04-16 NOTE — Telephone Encounter (Signed)
Rx faxed.    KP 

## 2011-04-17 ENCOUNTER — Telehealth: Payer: Self-pay | Admitting: Gastroenterology

## 2011-04-17 NOTE — Telephone Encounter (Signed)
Pt states that the pain has come back that she was originally having between her breasts in her chest. States she has been taking the medication that Dr. Arlyce Dice recommended but she is still rating her pain at about an 8 or 8.5 on a scale of 1-10. Dr. Arlyce Dice please advise.

## 2011-04-18 MED ORDER — METHYLPREDNISOLONE (PAK) 4 MG PO TABS: ORAL_TABLET | ORAL | Status: AC

## 2011-04-18 NOTE — Telephone Encounter (Signed)
Discontinue Clinoril. Begin medrol dose pack

## 2011-04-18 NOTE — Telephone Encounter (Signed)
Left message for pt to call back.  Spoke with pt and she is aware, rx sent to pharmacy.

## 2011-04-22 ENCOUNTER — Other Ambulatory Visit: Payer: Self-pay | Admitting: Family Medicine

## 2011-04-22 NOTE — Telephone Encounter (Signed)
Med prescribed at the hospital and last filled 03/28/11 # 30. Please advise     KP

## 2011-04-24 ENCOUNTER — Encounter: Payer: Self-pay | Admitting: Neurology

## 2011-04-29 ENCOUNTER — Encounter (HOSPITAL_COMMUNITY): Payer: Self-pay | Admitting: Emergency Medicine

## 2011-04-29 ENCOUNTER — Emergency Department (HOSPITAL_COMMUNITY)
Admission: EM | Admit: 2011-04-29 | Discharge: 2011-04-29 | Disposition: A | Discharge status: Home / Self Care | Payer: PRIVATE HEALTH INSURANCE | Attending: Emergency Medicine | Admitting: Emergency Medicine

## 2011-04-29 ENCOUNTER — Other Ambulatory Visit: Payer: Self-pay

## 2011-04-29 DIAGNOSIS — G8929 Other chronic pain: Secondary | ICD-10-CM

## 2011-04-29 DIAGNOSIS — I10 Essential (primary) hypertension: Secondary | ICD-10-CM | POA: Insufficient documentation

## 2011-04-29 DIAGNOSIS — Z79899 Other long term (current) drug therapy: Secondary | ICD-10-CM | POA: Insufficient documentation

## 2011-04-29 LAB — DIFFERENTIAL
Basophils Absolute: 0 10*3/uL (ref 0.0–0.1)
Basophils Relative: 0 % (ref 0–1)
Eosinophils Absolute: 0.1 10*3/uL (ref 0.0–0.7)
Eosinophils Relative: 2 % (ref 0–5)
Lymphocytes Relative: 56 % — ABNORMAL HIGH (ref 12–46)
Lymphs Abs: 2.7 10*3/uL (ref 0.7–4.0)
Monocytes Absolute: 0.4 10*3/uL (ref 0.1–1.0)
Monocytes Relative: 8 % (ref 3–12)
Neutro Abs: 1.7 10*3/uL (ref 1.7–7.7)
Neutrophils Relative %: 34 % — ABNORMAL LOW (ref 43–77)

## 2011-04-29 LAB — CBC
HCT: 40.5 % (ref 36.0–46.0)
Hemoglobin: 13.8 g/dL (ref 12.0–15.0)
MCH: 28.9 pg (ref 26.0–34.0)
MCHC: 34.1 g/dL (ref 30.0–36.0)
MCV: 84.7 fL (ref 78.0–100.0)
Platelets: 178 10*3/uL (ref 150–400)
RBC: 4.78 MIL/uL (ref 3.87–5.11)
RDW: 14.7 % (ref 11.5–15.5)
WBC: 4.9 10*3/uL (ref 4.0–10.5)

## 2011-04-29 LAB — BASIC METABOLIC PANEL
BUN: 8 mg/dL (ref 6–23)
CO2: 22 mEq/L (ref 19–32)
Calcium: 10 mg/dL (ref 8.4–10.5)
Chloride: 101 mEq/L (ref 96–112)
Creatinine, Ser: 0.56 mg/dL (ref 0.50–1.10)
GFR calc Af Amer: >90 mL/min (ref >90)
GFR calc non Af Amer: >90 mL/min (ref >90)
Glucose, Bld: 99 mg/dL (ref 70–99)
Potassium: 2.8 mEq/L — ABNORMAL LOW (ref 3.5–5.1)
Sodium: 139 mEq/L (ref 135–145)

## 2011-04-29 LAB — CK: Total CK: 34 U/L (ref 7–177)

## 2011-04-29 MED ORDER — POTASSIUM CHLORIDE CRYS ER 20 MEQ PO TBCR
40.0000 meq | EXTENDED_RELEASE_TABLET | Freq: Once | ORAL | Status: AC
  Administered 2011-04-29: 40 meq via ORAL
  Filled 2011-04-29: qty 2

## 2011-04-29 MED ORDER — DIAZEPAM 5 MG PO TABS
10.0000 mg | ORAL_TABLET | Freq: Once | ORAL | Status: AC
  Administered 2011-04-29: 10 mg via ORAL
  Filled 2011-04-29: qty 2

## 2011-04-29 MED ORDER — DIAZEPAM 5 MG PO TABS: 5.0000 mg | ORAL_TABLET | Freq: Two times a day (BID) | ORAL | Status: AC

## 2011-04-29 MED ORDER — OXYCODONE HCL 10 MG PO TB12: 10.0000 mg | ORAL_TABLET | Freq: Two times a day (BID) | ORAL | Status: AC

## 2011-04-29 MED ORDER — HYDROMORPHONE HCL PF 2 MG/ML IJ SOLN
2.0000 mg | Freq: Once | INTRAMUSCULAR | Status: AC
  Administered 2011-04-29: 2 mg via INTRAMUSCULAR
  Filled 2011-04-29: qty 1

## 2011-04-29 NOTE — Discharge Instructions (Signed)
Chronic Pain Management Managing chronic pain is not easy. The goal is to provide as much pain relief as possible. There are emotional as well as physical problems. Chronic pain may lead to symptoms of depression which magnify those of the pain. Problems may include:  Anxiety.   Sleep disturbances.   Confused thinking.   Feeling cranky.   Fatigue.   Weight gain or loss.  Identify the source of the pain first, if possible. The pain may be masking another problem. Try to find a pain management specialist or clinic. Work with a team to create a treatment plan for you. MEDICATIONS  May include narcotics or opioids. Larger than normal doses may be needed to control your pain.   Drugs for depression may help.   Over-the-counter medicines may help for some conditions. These drugs may be used along with others for better pain relief.   May be injected into sites such as the spine and joints. Injections may have to be repeated if they wear off.  THERAPY MAY INCLUDE:  Working with a physical therapist to keep from getting stiff.   Regular, gentle exercise.   Cognitive or behavioral therapy.   Using complementary or integrative medicine such as:   Acupuncture.   Massage, Reiki, or Rolfing.   Aroma, color, light, or sound therapy.   Group support.  FOR MORE INFORMATION [email or web address]. American Chronic Pain Association [email or web address]. Document Released: 02/22/2004 Document Revised: 01/03/2011 Document Reviewed: 04/02/2007 [facility] Patient Information 2012 [place], [place].  Return for any new or worsening symptoms or any other concerns.

## 2011-04-29 NOTE — ED Provider Notes (Signed)
History     CSN: [id number]  Arrival date & time 04/29/11  1911   First MD Initiated Contact with Patient 04/29/11 2226      Chief Complaint  Patient presents with  . Generalized Body Aches    (Consider location/radiation/quality/duration/timing/severity/associated sxs/prior treatment) HPI CC generalized muscle aches for many months.  Described as cramping and dull pain in every muscle in her body, she has seen multiple specialist and made many trips to the ED for this same complaint.  Pt states pain medicine not controlling sx's.  Pain is moderate, worse with palpation.   Past Medical History  Diagnosis Date  . Unspecified essential hypertension   . Backache, unspecified   . Altered mental status   . Gastric ulcer, unspecified as acute or chronic, without mention of hemorrhage, perforation, or obstruction   . Helicobacter pylori (H. pylori)     History reviewed. No pertinent past surgical history.  Family History  Problem Relation Age of Onset  . Diabetes Sister     History  Substance Use Topics  . Smoking status: Current Everyday Smoker -- 0.0 packs/day    Types: Cigarettes  . Smokeless tobacco: Never Used  . Alcohol Use: Yes     occassional    OB History    Grav Para Term Preterm Abortions TAB SAB Ect Mult Living                  Review of Systems  Musculoskeletal: Positive for myalgias.  Neurological: Positive for tremors (all over).  All other systems reviewed and are negative.    Allergies  Tramadol  Home Medications   Current Outpatient Rx  Name Route Sig Dispense Refill  . AMLODIPINE BESYLATE 10 MG PO TABS Oral Take 10 mg by mouth daily.    Marland Kitchen HYDROCODONE-ACETAMINOPHEN 7.5-750 MG PO TABS  TAKE ONE TABLET BY MOUTH EVERY 6 HOURS AS NEEDED FOR PAIN 60 tablet 0  . LISINOPRIL 10 MG PO TABS Oral Take 10 mg by mouth daily.    Marland Kitchen METHOCARBAMOL 500 MG PO TABS      . PANTOPRAZOLE SODIUM 40 MG PO TBEC Oral Take 40 mg by mouth 2 (two) times daily.    Marland Kitchen  DIAZEPAM 5 MG PO TABS Oral Take 1 tablet (5 mg total) by mouth 2 (two) times daily. 30 tablet 0  . OXYCODONE HCL ER 10 MG PO TB12 Oral Take 1 tablet (10 mg total) by mouth every 12 (twelve) hours. 30 tablet 0    BP 113/85  Pulse 104  Temp(Src) 98.3 F (36.8 C) (Oral)  Resp 15  SpO2 100%  LMP 04/11/2011  Physical Exam  Nursing note and vitals reviewed. Constitutional: She appears well-developed and well-nourished.  HENT:  Head: Normocephalic and atraumatic.  Eyes: Right eye exhibits no discharge. Left eye exhibits no discharge.  Neck: Normal range of motion. Neck supple.  Cardiovascular: Normal rate, regular rhythm and normal heart sounds.   Pulmonary/Chest: Effort normal and breath sounds normal.  Abdominal: Soft. There is no tenderness.  Musculoskeletal: She exhibits tenderness (to light palpation all muscles, every where I palpate). She exhibits no edema.  Neurological: She is alert. GCS eye subscore is 4. GCS verbal subscore is 5. GCS motor subscore is 6.  Skin: Skin is warm and dry.  Psychiatric: She has a normal mood and affect. Her behavior is normal.    ED Course  Procedures (including critical care time)  Labs Reviewed  DIFFERENTIAL - Abnormal; Notable for the following:  Neutrophils Relative 34 (*)    Lymphocytes Relative 56 (*)    All other components within normal limits  BASIC METABOLIC PANEL - Abnormal; Notable for the following:    Potassium 2.8 (*)    All other components within normal limits  CBC  CK  LAB REPORT - SCANNED   No results found.   1. Chronic pain       MDM  Pt is in nad, here with chronic muscle aches, no fever, shaking all over, crying, states pain meds do not help.  Looked over past med records and pt has been seen by rheumatology, has had extensive work up, has not had ck, will get this.  Dilaudid and valium given.  Potassium low, giving PO replacement.  Advised to start taking multivitamin supplement.  Results d/w pt and family.   She is sleeping on return, much more calm, will d/c with additional analgesic as well as valium po for muscle relaxation.  Return warnings given        Elijio Miles, MD 04/30/11 2308

## 2011-04-29 NOTE — ED Notes (Signed)
PT. REPORTS GENERALIZED BODY ACHES WITH FEET AND HANDS NUMBNESS / TINGLING /TREMORS SINCE JAN . 2013. RESPIRATIONS UNLABORED.

## 2011-04-29 NOTE — ED Notes (Signed)
Patient is AOx4 and comfortable with her discharge instructions.  The patient's family member is driving her home.

## 2011-05-01 NOTE — ED Provider Notes (Signed)
I saw and evaluated the patient, reviewed the resident's note and I agree with the findings and plan.  Diffuse muscle pain since January.  Extensive workup without cause.  No fever. No overlying skin change, joint effusion. No evidence of septic joints.  Glynn Octave, MD 05/01/11 1011

## 2011-05-02 ENCOUNTER — Telehealth: Payer: Self-pay | Admitting: Family Medicine

## 2011-05-02 NOTE — Telephone Encounter (Signed)
Please advise      KP 

## 2011-05-02 NOTE — Telephone Encounter (Signed)
Pt is requesting neuro at [facility] since appointment here is not until 5/20

## 2011-05-03 NOTE — Telephone Encounter (Signed)
Referral processed, patient's sister, Jamesetta So, made aware of appointment.

## 2011-05-28 DIAGNOSIS — G64 Other disorders of peripheral nervous system: Secondary | ICD-10-CM | POA: Insufficient documentation

## 2011-06-05 ENCOUNTER — Ambulatory Visit: Payer: PRIVATE HEALTH INSURANCE | Admitting: Gastroenterology

## 2011-06-14 ENCOUNTER — Telehealth: Payer: Self-pay | Admitting: Family Medicine

## 2011-06-14 NOTE — Telephone Encounter (Signed)
Please advise      KP 

## 2011-06-14 NOTE — Telephone Encounter (Signed)
She has seen so many Drs and has not been back her.  She probably needs one of the specialist---maybe neuro to fill out new papers ---but pt definitely can not go back to work.   She is still in wheelchair as far as I know.

## 2011-06-14 NOTE — Telephone Encounter (Signed)
Incoming call today from patient's employer/[employer] regarding her FMLA.  They are stating this patient's fmla ended on 06-05-11, and that she should be returning to work.  Per patient's sister, Jamesetta So, patient is still not able to return to work.  [employer] is requesting a call from Dr. Laury Axon to clarify all this.  Please advise.

## 2011-06-17 ENCOUNTER — Ambulatory Visit: Payer: PRIVATE HEALTH INSURANCE | Admitting: Neurology

## 2011-06-17 NOTE — Telephone Encounter (Signed)
She would need to come in for me to do it because it has been so long since I've seen her.  Will one of the specialists fill it out?

## 2011-06-17 NOTE — Telephone Encounter (Signed)
Paperwork left on the ledge.    KP

## 2011-06-18 NOTE — Telephone Encounter (Signed)
Samantha [name] will have the patient call to schedule an apt next week.     KP

## 2011-06-19 ENCOUNTER — Ambulatory Visit: Payer: PRIVATE HEALTH INSURANCE | Admitting: Rehabilitative and Restorative Service Providers"

## 2011-06-19 ENCOUNTER — Ambulatory Visit: Payer: PRIVATE HEALTH INSURANCE | Admitting: Occupational Therapy

## 2011-06-26 ENCOUNTER — Encounter: Payer: Self-pay | Admitting: Family Medicine

## 2011-06-26 ENCOUNTER — Ambulatory Visit (INDEPENDENT_AMBULATORY_CARE_PROVIDER_SITE_OTHER): Payer: PRIVATE HEALTH INSURANCE | Admitting: Family Medicine

## 2011-06-26 VITALS — BP 114/70 | HR 93 | Temp 98.5°F | Resp 18 | Wt 123.0 lb

## 2011-06-26 DIAGNOSIS — M549 Dorsalgia, unspecified: Secondary | ICD-10-CM

## 2011-06-26 DIAGNOSIS — G610 Guillain-Barre syndrome: Secondary | ICD-10-CM

## 2011-06-26 MED ORDER — OXYCODONE-ACETAMINOPHEN 5-325 MG PO TABS: 1.0000 | ORAL_TABLET | Freq: Three times a day (TID) | ORAL | Status: AC | PRN

## 2011-06-26 MED ORDER — CYCLOBENZAPRINE HCL 10 MG PO TABS: 10.0000 mg | ORAL_TABLET | Freq: Three times a day (TID) | ORAL | Status: AC | PRN

## 2011-06-26 NOTE — Patient Instructions (Signed)
Back Pain, Adult Low back pain is very common. About 1 in 5 people have back pain.The cause of low back pain is rarely dangerous. The pain often gets better over time.About half of people with a sudden onset of back pain feel better in just 2 weeks. About 8 in 10 people feel better by 6 weeks.  CAUSES Some common causes of back pain include:  Strain of the muscles or ligaments supporting the spine.   Wear and tear (degeneration) of the spinal discs.   Arthritis.   Direct injury to the back.  DIAGNOSIS Most of the time, the direct cause of low back pain is not known.However, back pain can be treated effectively even when the exact cause of the pain is unknown.Answering your caregiver's questions about your overall health and symptoms is one of the most accurate ways to make sure the cause of your pain is not dangerous. If your caregiver needs more information, he or she may order lab work or imaging tests (X-rays or MRIs).However, even if imaging tests show changes in your back, this usually does not require surgery. HOME CARE INSTRUCTIONS For many people, back pain returns.Since low back pain is rarely dangerous, it is often a condition that people can learn to manageon their own.   Remain active. It is stressful on the back to sit or stand in one place. Do not sit, drive, or stand in one place for more than 30 minutes at a time. Take short walks on level surfaces as soon as pain allows.Try to increase the length of time you walk each day.   Do not stay in bed.Resting more than 1 or 2 days can delay your recovery.   Do not avoid exercise or work.Your body is made to move.It is not dangerous to be active, even though your back may hurt.Your back will likely heal faster if you return to being active before your pain is gone.   Pay attention to your body when you bend and lift. Many people have less discomfortwhen lifting if they bend their knees, keep the load close to their  bodies,and avoid twisting. Often, the most comfortable positions are those that put less stress on your recovering back.   Find a comfortable position to sleep. Use a firm mattress and lie on your side with your knees slightly bent. If you lie on your back, put a pillow under your knees.   Only take over-the-counter or prescription medicines as directed by your caregiver. Over-the-counter medicines to reduce pain and inflammation are often the most helpful.Your caregiver may prescribe muscle relaxant drugs.These medicines help dull your pain so you can more quickly return to your normal activities and healthy exercise.   Put ice on the injured area.   Put ice in a plastic bag.   Place a towel between your skin and the bag.   Leave the ice on for 15 to 20 minutes, 3 to 4 times a day for the first 2 to 3 days. After that, ice and heat may be alternated to reduce pain and spasms.   Ask your caregiver about trying back exercises and gentle massage. This may be of some benefit.   Avoid feeling anxious or stressed.Stress increases muscle tension and can worsen back pain.It is important to recognize when you are anxious or stressed and learn ways to manage it.Exercise is a great option.  SEEK MEDICAL CARE IF:  You have pain that is not relieved with rest or medicine.   You have   pain that does not improve in 1 week.   You have new symptoms.   You are generally not feeling well.  SEEK IMMEDIATE MEDICAL CARE IF:   You have pain that radiates from your back into your legs.   You develop new bowel or bladder control problems.   You have unusual weakness or numbness in your arms or legs.   You develop nausea or vomiting.   You develop abdominal pain.   You feel faint.  Document Released: 01/14/2005 Document Revised: 01/03/2011 Document Reviewed: 06/04/2010 [employer] Patient Information 2012 [employer]. 

## 2011-06-27 NOTE — Assessment & Plan Note (Signed)
Refer to Dr Ethelene Hal  Refill pain meds

## 2011-06-27 NOTE — Progress Notes (Signed)
  Subjective:    Patient ID: Samantha [name], female    DOB: 09/18/1960, 51 y.o.   MRN: [id number]  HPI Pt here to have fmla paper work filled out. Pt is still not able to go to work.  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  She will be seeing rheum also. Pt c/o back pain still and is unable to con't PT or OT secondary to it.   See MRI and NS ov.    Review of Systems As above    Objective:   Physical Exam  Constitutional: She appears well-developed and well-nourished.  Psychiatric: She has a normal mood and affect. Her behavior is normal. Judgment and thought content normal.          Assessment & Plan:

## 2011-06-27 NOTE — Assessment & Plan Note (Signed)
Per neuro 

## 2011-07-11 ENCOUNTER — Ambulatory Visit: Payer: PRIVATE HEALTH INSURANCE | Admitting: Physical Therapy

## 2011-07-11 ENCOUNTER — Ambulatory Visit: Payer: PRIVATE HEALTH INSURANCE | Attending: Psychiatry | Admitting: *Deleted

## 2011-07-11 DIAGNOSIS — M6281 Muscle weakness (generalized): Secondary | ICD-10-CM | POA: Insufficient documentation

## 2011-07-11 DIAGNOSIS — Z5189 Encounter for other specified aftercare: Secondary | ICD-10-CM | POA: Insufficient documentation

## 2011-07-11 DIAGNOSIS — R269 Unspecified abnormalities of gait and mobility: Secondary | ICD-10-CM | POA: Insufficient documentation

## 2011-07-16 ENCOUNTER — Ambulatory Visit: Payer: PRIVATE HEALTH INSURANCE | Admitting: Physical Therapy

## 2011-07-30 ENCOUNTER — Ambulatory Visit: Payer: PRIVATE HEALTH INSURANCE | Admitting: Occupational Therapy

## 2011-07-30 ENCOUNTER — Ambulatory Visit: Payer: PRIVATE HEALTH INSURANCE | Attending: Psychiatry | Admitting: Physical Therapy

## 2011-07-30 DIAGNOSIS — Z5189 Encounter for other specified aftercare: Secondary | ICD-10-CM | POA: Insufficient documentation

## 2011-07-30 DIAGNOSIS — R269 Unspecified abnormalities of gait and mobility: Secondary | ICD-10-CM | POA: Insufficient documentation

## 2011-07-30 DIAGNOSIS — M6281 Muscle weakness (generalized): Secondary | ICD-10-CM | POA: Insufficient documentation

## 2011-08-06 ENCOUNTER — Ambulatory Visit: Payer: PRIVATE HEALTH INSURANCE | Admitting: Occupational Therapy

## 2011-08-06 ENCOUNTER — Ambulatory Visit: Payer: PRIVATE HEALTH INSURANCE | Admitting: Physical Therapy

## 2011-08-13 ENCOUNTER — Ambulatory Visit: Payer: PRIVATE HEALTH INSURANCE | Admitting: Physical Therapy

## 2011-08-13 ENCOUNTER — Ambulatory Visit: Payer: PRIVATE HEALTH INSURANCE | Admitting: Occupational Therapy

## 2011-08-20 ENCOUNTER — Ambulatory Visit: Payer: PRIVATE HEALTH INSURANCE | Admitting: Occupational Therapy

## 2011-08-21 DIAGNOSIS — M329 Systemic lupus erythematosus, unspecified: Secondary | ICD-10-CM | POA: Insufficient documentation

## 2011-08-22 ENCOUNTER — Encounter: Payer: Self-pay | Admitting: Family Medicine

## 2011-08-22 ENCOUNTER — Ambulatory Visit (INDEPENDENT_AMBULATORY_CARE_PROVIDER_SITE_OTHER): Payer: PRIVATE HEALTH INSURANCE | Admitting: Family Medicine

## 2011-08-22 VITALS — BP 116/70 | HR 93 | Temp 99.7°F | Wt 135.0 lb

## 2011-08-22 DIAGNOSIS — G610 Guillain-Barre syndrome: Secondary | ICD-10-CM

## 2011-08-22 DIAGNOSIS — M329 Systemic lupus erythematosus, unspecified: Secondary | ICD-10-CM | POA: Insufficient documentation

## 2011-08-22 MED ORDER — NONFORMULARY OR COMPOUNDED ITEM: Status: DC

## 2011-08-22 NOTE — Assessment & Plan Note (Signed)
Per neuro at [facility]

## 2011-08-22 NOTE — Assessment & Plan Note (Signed)
Just started treatment Per rheum at [facility]

## 2011-08-23 ENCOUNTER — Encounter: Payer: Self-pay | Admitting: Family Medicine

## 2011-08-23 NOTE — Progress Notes (Signed)
  Subjective:    Patient ID: Samantha [name], female    DOB: Sep 11, 1960, 51 y.o.   MRN: [id number]  HPI Pt here to have paper work filled out for [employer].  Pt is doing better but still struggling to walk and c/o weakness.  Pt recently saw rheum and was dx with SLE and was started on treatment.  Too soon to see improvement.   Review of Systems As above    Objective:   Physical Exam  Constitutional: She is oriented to person, place, and time. She appears well-developed and well-nourished.  Cardiovascular: Normal rate, regular rhythm and normal heart sounds.   No murmur heard. Pulmonary/Chest: Effort normal and breath sounds normal. No respiratory distress. She has no wheezes. She has no rales.  Neurological: She is alert and oriented to person, place, and time.       Still with unsteady gait but is able to walk out of office with little assistance  Psychiatric: She has a normal mood and affect. Her behavior is normal. Judgment and thought content normal.          Assessment & Plan:

## 2011-08-26 ENCOUNTER — Other Ambulatory Visit: Payer: Self-pay | Admitting: Family Medicine

## 2011-08-26 DIAGNOSIS — K111 Hypertrophy of salivary gland: Secondary | ICD-10-CM

## 2011-08-28 ENCOUNTER — Ambulatory Visit: Payer: PRIVATE HEALTH INSURANCE | Admitting: Occupational Therapy

## 2011-08-28 ENCOUNTER — Other Ambulatory Visit: Payer: Self-pay

## 2011-08-28 ENCOUNTER — Ambulatory Visit: Payer: PRIVATE HEALTH INSURANCE | Admitting: *Deleted

## 2011-08-28 MED ORDER — OXYCODONE-ACETAMINOPHEN 10-325 MG PO TABS: 1.0000 | ORAL_TABLET | Freq: Three times a day (TID) | ORAL | Status: DC | PRN

## 2011-08-28 MED ORDER — CYCLOBENZAPRINE HCL 10 MG PO TABS: 10.0000 mg | ORAL_TABLET | Freq: Three times a day (TID) | ORAL | Status: DC | PRN

## 2011-08-28 NOTE — Telephone Encounter (Signed)
Patient Rx ready for pick up.   KP

## 2011-08-28 NOTE — Telephone Encounter (Signed)
Last seen 08/22/11. Please advise    KP

## 2011-09-02 ENCOUNTER — Ambulatory Visit: Payer: PRIVATE HEALTH INSURANCE | Attending: Psychiatry | Admitting: Physical Therapy

## 2011-09-02 ENCOUNTER — Ambulatory Visit: Payer: PRIVATE HEALTH INSURANCE | Admitting: Occupational Therapy

## 2011-09-02 DIAGNOSIS — M6281 Muscle weakness (generalized): Secondary | ICD-10-CM | POA: Insufficient documentation

## 2011-09-02 DIAGNOSIS — Z5189 Encounter for other specified aftercare: Secondary | ICD-10-CM | POA: Insufficient documentation

## 2011-09-02 DIAGNOSIS — R269 Unspecified abnormalities of gait and mobility: Secondary | ICD-10-CM | POA: Insufficient documentation

## 2011-09-10 ENCOUNTER — Ambulatory Visit: Payer: PRIVATE HEALTH INSURANCE | Admitting: Physical Therapy

## 2011-09-10 ENCOUNTER — Ambulatory Visit: Payer: PRIVATE HEALTH INSURANCE | Admitting: Occupational Therapy

## 2011-09-18 ENCOUNTER — Encounter: Payer: PRIVATE HEALTH INSURANCE | Admitting: Occupational Therapy

## 2011-09-18 ENCOUNTER — Ambulatory Visit: Payer: PRIVATE HEALTH INSURANCE | Admitting: Physical Therapy

## 2011-09-24 ENCOUNTER — Ambulatory Visit: Payer: PRIVATE HEALTH INSURANCE | Admitting: Physical Therapy

## 2011-10-02 ENCOUNTER — Ambulatory Visit: Payer: PRIVATE HEALTH INSURANCE | Admitting: Physical Therapy

## 2011-10-09 ENCOUNTER — Ambulatory Visit: Payer: PRIVATE HEALTH INSURANCE | Attending: Psychiatry | Admitting: Physical Therapy

## 2011-10-09 DIAGNOSIS — R269 Unspecified abnormalities of gait and mobility: Secondary | ICD-10-CM | POA: Insufficient documentation

## 2011-10-09 DIAGNOSIS — Z5189 Encounter for other specified aftercare: Secondary | ICD-10-CM | POA: Insufficient documentation

## 2011-10-09 DIAGNOSIS — M6281 Muscle weakness (generalized): Secondary | ICD-10-CM | POA: Insufficient documentation

## 2011-10-23 ENCOUNTER — Ambulatory Visit: Payer: PRIVATE HEALTH INSURANCE | Admitting: Physical Therapy

## 2011-10-28 ENCOUNTER — Telehealth: Payer: Self-pay

## 2011-10-28 NOTE — Telephone Encounter (Signed)
Pt appt schedule 11/04/11 10:30am for lab work.   MW

## 2011-11-04 ENCOUNTER — Other Ambulatory Visit: Payer: PRIVATE HEALTH INSURANCE

## 2011-11-05 ENCOUNTER — Other Ambulatory Visit (INDEPENDENT_AMBULATORY_CARE_PROVIDER_SITE_OTHER): Payer: PRIVATE HEALTH INSURANCE

## 2011-11-05 DIAGNOSIS — Z79899 Other long term (current) drug therapy: Secondary | ICD-10-CM

## 2011-11-05 DIAGNOSIS — M459 Ankylosing spondylitis of unspecified sites in spine: Secondary | ICD-10-CM

## 2011-11-05 LAB — CBC WITH DIFFERENTIAL/PLATELET
Basophils Absolute: 0 10*3/uL (ref 0.0–0.1)
Basophils Relative: 0.9 % (ref 0.0–3.0)
Eosinophils Absolute: 0.1 10*3/uL (ref 0.0–0.7)
Eosinophils Relative: 2 % (ref 0.0–5.0)
HCT: 36.6 % (ref 36.0–46.0)
Hemoglobin: 12 g/dL (ref 12.0–15.0)
Lymphocytes Relative: 39.3 % (ref 12.0–46.0)
Lymphs Abs: 1.7 10*3/uL (ref 0.7–4.0)
MCHC: 32.8 g/dL (ref 30.0–36.0)
MCV: 90 fl (ref 78.0–100.0)
Monocytes Absolute: 0.4 10*3/uL (ref 0.1–1.0)
Monocytes Relative: 8.6 % (ref 3.0–12.0)
Neutro Abs: 2.1 10*3/uL (ref 1.4–7.7)
Neutrophils Relative %: 49.2 % (ref 43.0–77.0)
Platelets: 197 10*3/uL (ref 150.0–400.0)
RBC: 4.06 Mil/uL (ref 3.87–5.11)
RDW: 15.1 % — ABNORMAL HIGH (ref 11.5–14.6)
WBC: 4.3 10*3/uL — ABNORMAL LOW (ref 4.5–10.5)

## 2011-11-20 DIAGNOSIS — M7660 Achilles tendinitis, unspecified leg: Secondary | ICD-10-CM | POA: Insufficient documentation

## 2011-11-22 ENCOUNTER — Encounter: Payer: Self-pay | Admitting: Family Medicine

## 2011-11-22 ENCOUNTER — Ambulatory Visit
Admission: RE | Admit: 2011-11-22 | Discharge: 2011-11-22 | Disposition: A | Discharge status: Home / Self Care | Payer: PRIVATE HEALTH INSURANCE | Source: Ambulatory Visit | Attending: Family Medicine | Admitting: Family Medicine

## 2011-11-22 ENCOUNTER — Ambulatory Visit (INDEPENDENT_AMBULATORY_CARE_PROVIDER_SITE_OTHER): Payer: PRIVATE HEALTH INSURANCE | Admitting: Family Medicine

## 2011-11-22 VITALS — BP 110/70 | HR 92 | Temp 99.1°F | Wt 151.6 lb

## 2011-11-22 DIAGNOSIS — R05 Cough: Secondary | ICD-10-CM

## 2011-11-22 DIAGNOSIS — F17200 Nicotine dependence, unspecified, uncomplicated: Secondary | ICD-10-CM

## 2011-11-22 DIAGNOSIS — R059 Cough, unspecified: Secondary | ICD-10-CM

## 2011-11-22 DIAGNOSIS — Z720 Tobacco use: Secondary | ICD-10-CM

## 2011-11-22 HISTORY — DX: Nicotine dependence, unspecified, uncomplicated: F17.200

## 2011-11-22 MED ORDER — AZITHROMYCIN 250 MG PO TABS: ORAL_TABLET | ORAL | Status: DC

## 2011-11-22 MED ORDER — NICOTINE 10 MG IN INHA: 1.0000 | RESPIRATORY_TRACT | Status: DC | PRN

## 2011-11-22 NOTE — Progress Notes (Signed)
  Subjective:     Samantha [name] is a 51 y.o. female here for evaluation of a cough. Onset of symptoms was 4 days ago. Symptoms have been gradually improving since that time. The cough is dry and is aggravated by reclining position. Associated symptoms include: none. Patient does not have a history of asthma. Patient does not have a history of environmental allergens. Patient has not traveled recently. Patient does have a history of smoking. Patient has had a previous chest x-ray. Patient has not had a PPD done.  The following portions of the patient's history were reviewed and updated as appropriate: allergies, current medications, past family history, past medical history, past social history, past surgical history and problem list.  Review of Systems Pertinent items are noted in HPI.    Objective:    Oxygen saturation 96% on room air BP 110/70  Pulse 92  Temp 99.1 F (37.3 C) (Oral)  Wt 151 lb 9.6 oz (68.765 kg)  SpO2 96%  LMP 04/11/2011 General appearance: alert, cooperative, appears stated age and no distress Ears: normal TM's and external ear canals both ears Nose: Nares normal. Septum midline. Mucosa normal. No drainage or sinus tenderness. Throat: abnormal findings: mild oropharyngeal erythema and pnd Neck: no adenopathy, supple, symmetrical, trachea midline and thyroid not enlarged, symmetric, no tenderness/mass/nodules Lungs: wheezes bilaterally Heart: S1, S2 normal    Assessment:    Acute Bronchitis    Plan:    Antibiotics per medication orders. Avoid exposure to tobacco smoke and fumes. B-agonist inhaler. Call if shortness of breath worsens, blood in sputum, change in character of cough, development of fever or chills, inability to maintain nutrition and hydration. Avoid exposure to tobacco smoke and fumes. Chest x-ray. Steroid inhaler as ordered.  Smoking cessation d/w pt-- pt given sample nicotrol inhaler

## 2011-11-22 NOTE — Patient Instructions (Signed)
Smoking Cessation Quitting smoking is important to your health and has many advantages. However, it is not always easy to quit since nicotine is a very addictive drug. Often times, people try 3 times or more before being able to quit. This document explains the best ways for you to prepare to quit smoking. Quitting takes hard work and a lot of effort, but you can do it. ADVANTAGES OF QUITTING SMOKING  You will live longer, feel better, and live better.  Your body will feel the impact of quitting smoking almost immediately.  Within 20 minutes, blood pressure decreases. Your pulse returns to its normal level.  After 8 hours, carbon monoxide levels in the blood return to normal. Your oxygen level increases.  After 24 hours, the chance of having a heart attack starts to decrease. Your breath, hair, and body stop smelling like smoke.  After 48 hours, damaged nerve endings begin to recover. Your sense of taste and smell improve.  After 72 hours, the body is virtually free of nicotine. Your bronchial tubes relax and breathing becomes easier.  After 2 to 12 weeks, lungs can hold more air. Exercise becomes easier and circulation improves.  The risk of having a heart attack, stroke, cancer, or lung disease is greatly reduced.  After 1 year, the risk of coronary heart disease is cut in half.  After 5 years, the risk of stroke falls to the same as a nonsmoker.  After 10 years, the risk of lung cancer is cut in half and the risk of other cancers decreases significantly.  After 15 years, the risk of coronary heart disease drops, usually to the level of a nonsmoker.  If you are pregnant, quitting smoking will improve your chances of having a healthy baby.  The people you live with, especially any children, will be healthier.  You will have extra money to spend on things other than cigarettes. QUESTIONS TO THINK ABOUT BEFORE ATTEMPTING TO QUIT You may want to talk about your answers with your  caregiver.  Why do you want to quit?  If you tried to quit in the past, what helped and what did not?  What will be the most difficult situations for you after you quit? How will you plan to handle them?  Who can help you through the tough times? Your family? Friends? A caregiver?  What pleasures do you get from smoking? What ways can you still get pleasure if you quit? Here are some questions to ask your caregiver:  How can you help me to be successful at quitting?  What medicine do you think would be best for me and how should I take it?  What should I do if I need more help?  What is smoking withdrawal like? How can I get information on withdrawal? GET READY  Set a quit date.  Change your environment by getting rid of all cigarettes, ashtrays, matches, and lighters in your home, car, or work. Do not let people smoke in your home.  Review your past attempts to quit. Think about what worked and what did not. GET SUPPORT AND ENCOURAGEMENT You have a better chance of being successful if you have help. You can get support in many ways.  Tell your family, friends, and co-workers that you are going to quit and need their support. Ask them not to smoke around you.  Get individual, group, or telephone counseling and support. Programs are available at local hospitals and health centers. Call your local health department for   information about programs in your area.  Spiritual beliefs and practices may help some smokers quit.  Download a "quit meter" on your computer to keep track of quit statistics, such as how long you have gone without smoking, cigarettes not smoked, and money saved.  Get a self-help book about quitting smoking and staying off of tobacco. LEARN NEW SKILLS AND BEHAVIORS  Distract yourself from urges to smoke. Talk to someone, go for a walk, or occupy your time with a task.  Change your normal routine. Take a different route to work. Drink tea instead of coffee.  Eat breakfast in a different place.  Reduce your stress. Take a hot bath, exercise, or read a book.  Plan something enjoyable to do every day. Reward yourself for not smoking.  Explore interactive web-based programs that specialize in helping you quit. GET MEDICINE AND USE IT CORRECTLY Medicines can help you stop smoking and decrease the urge to smoke. Combining medicine with the above behavioral methods and support can greatly increase your chances of successfully quitting smoking.  Nicotine replacement therapy helps deliver nicotine to your body without the negative effects and risks of smoking. Nicotine replacement therapy includes nicotine gum, lozenges, inhalers, nasal sprays, and skin patches. Some may be available over-the-counter and others require a prescription.  Antidepressant medicine helps people abstain from smoking, but how this works is unknown. This medicine is available by prescription.  Nicotinic receptor partial agonist medicine simulates the effect of nicotine in your brain. This medicine is available by prescription. Ask your caregiver for advice about which medicines to use and how to use them based on your health history. Your caregiver will tell you what side effects to look out for if you choose to be on a medicine or therapy. Carefully read the information on the package. Do not use any other product containing nicotine while using a nicotine replacement product.  RELAPSE OR DIFFICULT SITUATIONS Most relapses occur within the first 3 months after quitting. Do not be discouraged if you start smoking again. Remember, most people try several times before finally quitting. You may have symptoms of withdrawal because your body is used to nicotine. You may crave cigarettes, be irritable, feel very hungry, cough often, get headaches, or have difficulty concentrating. The withdrawal symptoms are only temporary. They are strongest when you first quit, but they will go away within  10 14 days. To reduce the chances of relapse, try to:  Avoid drinking alcohol. Drinking lowers your chances of successfully quitting.  Reduce the amount of caffeine you consume. Once you quit smoking, the amount of caffeine in your body increases and can give you symptoms, such as a rapid heartbeat, sweating, and anxiety.  Avoid smokers because they can make you want to smoke.  Do not let weight gain distract you. Many smokers will gain weight when they quit, usually less than 10 pounds. Eat a healthy diet and stay active. You can always lose the weight gained after you quit.  Find ways to improve your mood other than smoking. FOR MORE INFORMATION  www.smokefree.gov  Document Released: 01/08/2001 Document Revised: 07/16/2011 Document Reviewed: 04/25/2011 Exit[employer] Patient Information 2013 [employer].  

## 2011-11-25 ENCOUNTER — Ambulatory Visit: Payer: PRIVATE HEALTH INSURANCE | Admitting: Family Medicine

## 2011-11-26 ENCOUNTER — Telehealth: Payer: Self-pay

## 2011-11-26 ENCOUNTER — Ambulatory Visit: Payer: PRIVATE HEALTH INSURANCE

## 2011-11-26 NOTE — Telephone Encounter (Signed)
Pt called in to get results. I advised pt xray neg. Pt stated understanding.        MW

## 2011-12-05 ENCOUNTER — Telehealth: Payer: Self-pay | Admitting: Family Medicine

## 2011-12-05 MED ORDER — HYDROCODONE-ACETAMINOPHEN 7.5-750 MG PO TABS: 1.0000 | ORAL_TABLET | Freq: Four times a day (QID) | ORAL | Status: DC | PRN

## 2011-12-05 NOTE — Telephone Encounter (Signed)
Refill x1 

## 2011-12-05 NOTE — Telephone Encounter (Signed)
Last seen 11/22/11 and filled 04/22/11 # [phone]. Please advise    KP

## 2011-12-05 NOTE — Telephone Encounter (Signed)
Patient calling to request refill of her Hydrocodone 7.5-750mg .  If approved, please send to [facility] on [address].

## 2011-12-10 ENCOUNTER — Encounter: Payer: Self-pay | Admitting: Family Medicine

## 2011-12-10 ENCOUNTER — Ambulatory Visit (INDEPENDENT_AMBULATORY_CARE_PROVIDER_SITE_OTHER): Payer: PRIVATE HEALTH INSURANCE | Admitting: Family Medicine

## 2011-12-10 VITALS — BP 108/64 | HR 96 | Temp 98.7°F | Wt 155.2 lb

## 2011-12-10 DIAGNOSIS — J40 Bronchitis, not specified as acute or chronic: Secondary | ICD-10-CM

## 2011-12-10 DIAGNOSIS — Z720 Tobacco use: Secondary | ICD-10-CM

## 2011-12-10 DIAGNOSIS — F17200 Nicotine dependence, unspecified, uncomplicated: Secondary | ICD-10-CM

## 2011-12-10 NOTE — Progress Notes (Signed)
  Subjective:    Patient ID: Samantha [name], female    DOB: 05-Mar-1960, 51 y.o.   MRN: [id number]  HPI Pt here to f/u bronchitis.  She is feeling much better.   No complaints.  She is using the nicotrol Inhaler and is cutting down on cigarettes.      Review of Systems As above    Objective:   Physical Exam  Constitutional: She is oriented to person, place, and time. She appears well-developed and well-nourished.  Pulmonary/Chest: Effort normal and breath sounds normal. No respiratory distress.  Neurological: She is alert and oriented to person, place, and time.  Psychiatric: She has a normal mood and affect. Her behavior is normal. Judgment and thought content normal.          Assessment & Plan:

## 2011-12-10 NOTE — Patient Instructions (Addendum)
Smoking Cessation Quitting smoking is important to your health and has many advantages. However, it is not always easy to quit since nicotine is a very addictive drug. Often times, people try 3 times or more before being able to quit. This document explains the best ways for you to prepare to quit smoking. Quitting takes hard work and a lot of effort, but you can do it. ADVANTAGES OF QUITTING SMOKING  You will live longer, feel better, and live better.  Your body will feel the impact of quitting smoking almost immediately.  Within 20 minutes, blood pressure decreases. Your pulse returns to its normal level.  After 8 hours, carbon monoxide levels in the blood return to normal. Your oxygen level increases.  After 24 hours, the chance of having a heart attack starts to decrease. Your breath, hair, and body stop smelling like smoke.  After 48 hours, damaged nerve endings begin to recover. Your sense of taste and smell improve.  After 72 hours, the body is virtually free of nicotine. Your bronchial tubes relax and breathing becomes easier.  After 2 to 12 weeks, lungs can hold more air. Exercise becomes easier and circulation improves.  The risk of having a heart attack, stroke, cancer, or lung disease is greatly reduced.  After 1 year, the risk of coronary heart disease is cut in half.  After 5 years, the risk of stroke falls to the same as a nonsmoker.  After 10 years, the risk of lung cancer is cut in half and the risk of other cancers decreases significantly.  After 15 years, the risk of coronary heart disease drops, usually to the level of a nonsmoker.  If you are pregnant, quitting smoking will improve your chances of having a healthy baby.  The people you live with, especially any children, will be healthier.  You will have extra money to spend on things other than cigarettes. QUESTIONS TO THINK ABOUT BEFORE ATTEMPTING TO QUIT You may want to talk about your answers with your  caregiver.  Why do you want to quit?  If you tried to quit in the past, what helped and what did not?  What will be the most difficult situations for you after you quit? How will you plan to handle them?  Who can help you through the tough times? Your family? Friends? A caregiver?  What pleasures do you get from smoking? What ways can you still get pleasure if you quit? Here are some questions to ask your caregiver:  How can you help me to be successful at quitting?  What medicine do you think would be best for me and how should I take it?  What should I do if I need more help?  What is smoking withdrawal like? How can I get information on withdrawal? GET READY  Set a quit date.  Change your environment by getting rid of all cigarettes, ashtrays, matches, and lighters in your home, car, or work. Do not let people smoke in your home.  Review your past attempts to quit. Think about what worked and what did not. GET SUPPORT AND ENCOURAGEMENT You have a better chance of being successful if you have help. You can get support in many ways.  Tell your family, friends, and co-workers that you are going to quit and need their support. Ask them not to smoke around you.  Get individual, group, or telephone counseling and support. Programs are available at local hospitals and health centers. Call your local health department for   information about programs in your area.  Spiritual beliefs and practices may help some smokers quit.  Download a "quit meter" on your computer to keep track of quit statistics, such as how long you have gone without smoking, cigarettes not smoked, and money saved.  Get a self-help book about quitting smoking and staying off of tobacco. LEARN NEW SKILLS AND BEHAVIORS  Distract yourself from urges to smoke. Talk to someone, go for a walk, or occupy your time with a task.  Change your normal routine. Take a different route to work. Drink tea instead of coffee.  Eat breakfast in a different place.  Reduce your stress. Take a hot bath, exercise, or read a book.  Plan something enjoyable to do every day. Reward yourself for not smoking.  Explore interactive web-based programs that specialize in helping you quit. GET MEDICINE AND USE IT CORRECTLY Medicines can help you stop smoking and decrease the urge to smoke. Combining medicine with the above behavioral methods and support can greatly increase your chances of successfully quitting smoking.  Nicotine replacement therapy helps deliver nicotine to your body without the negative effects and risks of smoking. Nicotine replacement therapy includes nicotine gum, lozenges, inhalers, nasal sprays, and skin patches. Some may be available over-the-counter and others require a prescription.  Antidepressant medicine helps people abstain from smoking, but how this works is unknown. This medicine is available by prescription.  Nicotinic receptor partial agonist medicine simulates the effect of nicotine in your brain. This medicine is available by prescription. Ask your caregiver for advice about which medicines to use and how to use them based on your health history. Your caregiver will tell you what side effects to look out for if you choose to be on a medicine or therapy. Carefully read the information on the package. Do not use any other product containing nicotine while using a nicotine replacement product.  RELAPSE OR DIFFICULT SITUATIONS Most relapses occur within the first 3 months after quitting. Do not be discouraged if you start smoking again. Remember, most people try several times before finally quitting. You may have symptoms of withdrawal because your body is used to nicotine. You may crave cigarettes, be irritable, feel very hungry, cough often, get headaches, or have difficulty concentrating. The withdrawal symptoms are only temporary. They are strongest when you first quit, but they will go away within  10 14 days. To reduce the chances of relapse, try to:  Avoid drinking alcohol. Drinking lowers your chances of successfully quitting.  Reduce the amount of caffeine you consume. Once you quit smoking, the amount of caffeine in your body increases and can give you symptoms, such as a rapid heartbeat, sweating, and anxiety.  Avoid smokers because they can make you want to smoke.  Do not let weight gain distract you. Many smokers will gain weight when they quit, usually less than 10 pounds. Eat a healthy diet and stay active. You can always lose the weight gained after you quit.  Find ways to improve your mood other than smoking. FOR MORE INFORMATION  www.smokefree.gov  Document Released: 01/08/2001 Document Revised: 07/16/2011 Document Reviewed: 04/25/2011 ExitCare Patient Information 2013 [employer].  

## 2011-12-10 NOTE — Assessment & Plan Note (Signed)
resolved 

## 2011-12-10 NOTE — Assessment & Plan Note (Signed)
Improving con't with inhaler

## 2012-06-30 DIAGNOSIS — Z79899 Other long term (current) drug therapy: Secondary | ICD-10-CM | POA: Insufficient documentation

## 2012-07-16 ENCOUNTER — Encounter: Payer: Self-pay | Admitting: Family Medicine

## 2012-07-16 ENCOUNTER — Other Ambulatory Visit: Payer: PRIVATE HEALTH INSURANCE

## 2012-07-16 ENCOUNTER — Ambulatory Visit (INDEPENDENT_AMBULATORY_CARE_PROVIDER_SITE_OTHER): Payer: BC Managed Care – PPO | Admitting: Family Medicine

## 2012-07-16 VITALS — BP 120/74 | HR 86 | Temp 99.2°F | Wt 162.6 lb

## 2012-07-16 DIAGNOSIS — M329 Systemic lupus erythematosus, unspecified: Secondary | ICD-10-CM

## 2012-07-16 DIAGNOSIS — IMO0002 Reserved for concepts with insufficient information to code with codable children: Secondary | ICD-10-CM

## 2012-07-16 DIAGNOSIS — M549 Dorsalgia, unspecified: Secondary | ICD-10-CM

## 2012-07-16 LAB — CBC WITH DIFFERENTIAL/PLATELET
Basophils Absolute: 0 10*3/uL (ref 0.0–0.1)
Basophils Relative: 0.8 % (ref 0.0–3.0)
Eosinophils Absolute: 0.2 10*3/uL (ref 0.0–0.7)
Eosinophils Relative: 5 % (ref 0.0–5.0)
HCT: 37.4 % (ref 36.0–46.0)
Hemoglobin: 12.5 g/dL (ref 12.0–15.0)
Lymphocytes Relative: 42.7 % (ref 12.0–46.0)
Lymphs Abs: 1.6 10*3/uL (ref 0.7–4.0)
MCHC: 33.3 g/dL (ref 30.0–36.0)
MCV: 86.6 fl (ref 78.0–100.0)
Monocytes Absolute: 0.3 10*3/uL (ref 0.1–1.0)
Monocytes Relative: 8.8 % (ref 3.0–12.0)
Neutro Abs: 1.6 10*3/uL (ref 1.4–7.7)
Neutrophils Relative %: 42.7 % — ABNORMAL LOW (ref 43.0–77.0)
Platelets: 196 10*3/uL (ref 150.0–400.0)
RBC: 4.32 Mil/uL (ref 3.87–5.11)
RDW: 13.1 % (ref 11.5–14.6)
WBC: 3.8 10*3/uL — ABNORMAL LOW (ref 4.5–10.5)

## 2012-07-16 MED ORDER — OXYCODONE-ACETAMINOPHEN 10-325 MG PO TABS: 1.0000 | ORAL_TABLET | Freq: Three times a day (TID) | ORAL | Status: DC | PRN

## 2012-07-16 NOTE — Progress Notes (Signed)
  Subjective:    Patient ID: Samantha [name], female    DOB: 03/30/1960, 52 y.o.   MRN: [id number]  HPI Pt here for 6 month f/u.  She is doing well walking with cane.  She still has back pain especially on trip to [place].   No other complaints.    Review of Systems As above    Objective:   Physical Exam BP 120/74  Pulse 86  Temp(Src) 99.2 F (37.3 C) (Oral)  Wt 162 lb 9.6 oz (73.755 kg)  BMI 24.73 kg/m2  SpO2 98%  LMP 04/11/2011 General appearance: alert, cooperative, appears stated age and no distress Neck: no adenopathy, supple, symmetrical, trachea midline and thyroid not enlarged, symmetric, no tenderness/mass/nodules Lungs: clear to auscultation bilaterally Heart: S1, S2 normal       Assessment & Plan:

## 2012-07-16 NOTE — Assessment & Plan Note (Signed)
Per rhuem at [facility]

## 2012-07-16 NOTE — Assessment & Plan Note (Signed)
Refill pain meds Muscle refilled by rheum

## 2012-07-16 NOTE — Patient Instructions (Addendum)
Back Pain, Adult  Low back pain is very common. About 1 in 5 people have back pain. The cause of low back pain is rarely dangerous. The pain often gets better over time. About half of people with a sudden onset of back pain feel better in just 2 weeks. About 8 in 10 people feel better by 6 weeks.   CAUSES  Some common causes of back pain include:  · Strain of the muscles or ligaments supporting the spine.  · Wear and tear (degeneration) of the spinal discs.  · Arthritis.  · Direct injury to the back.  DIAGNOSIS  Most of the time, the direct cause of low back pain is not known. However, back pain can be treated effectively even when the exact cause of the pain is unknown. Answering your caregiver's questions about your overall health and symptoms is one of the most accurate ways to make sure the cause of your pain is not dangerous. If your caregiver needs more information, he or she may order lab work or imaging tests (X-rays or MRIs). However, even if imaging tests show changes in your back, this usually does not require surgery.  HOME CARE INSTRUCTIONS  For many people, back pain returns. Since low back pain is rarely dangerous, it is often a condition that people can learn to manage on their own.   · Remain active. It is stressful on the back to sit or stand in one place. Do not sit, drive, or stand in one place for more than 30 minutes at a time. Take short walks on level surfaces as soon as pain allows. Try to increase the length of time you walk each day.  · Do not stay in bed. Resting more than 1 or 2 days can delay your recovery.  · Do not avoid exercise or work. Your body is made to move. It is not dangerous to be active, even though your back may hurt. Your back will likely heal faster if you return to being active before your pain is gone.  · Pay attention to your body when you  bend and lift. Many people have less discomfort when lifting if they bend their knees, keep the load close to their bodies, and  avoid twisting. Often, the most comfortable positions are those that put less stress on your recovering back.  · Find a comfortable position to sleep. Use a firm mattress and lie on your side with your knees slightly bent. If you lie on your back, put a pillow under your knees.  · Only take over-the-counter or prescription medicines as directed by your caregiver. Over-the-counter medicines to reduce pain and inflammation are often the most helpful. Your caregiver may prescribe muscle relaxant drugs. These medicines help dull your pain so you can more quickly return to your normal activities and healthy exercise.  · Put ice on the injured area.  · Put ice in a plastic bag.  · Place a towel between your skin and the bag.  · Leave the ice on for 15-20 minutes, 3-4 times a day for the first 2 to 3 days. After that, ice and heat may be alternated to reduce pain and spasms.  · Ask your caregiver about trying back exercises and gentle massage. This may be of some benefit.  · Avoid feeling anxious or stressed. Stress increases muscle tension and can worsen back pain. It is important to recognize when you are anxious or stressed and learn ways to manage it. Exercise is a great option.  SEEK MEDICAL CARE IF:  · You have pain that is not relieved with rest or   medicine.  · You have pain that does not improve in 1 week.  · You have new symptoms.  · You are generally not feeling well.  SEEK IMMEDIATE MEDICAL CARE IF:   · You have pain that radiates from your back into your legs.  · You develop new bowel or bladder control problems.  · You have unusual weakness or numbness in your arms or legs.  · You develop nausea or vomiting.  · You develop abdominal pain.  · You feel faint.  Document Released: 01/14/2005 Document Revised: 07/16/2011 Document Reviewed: 06/04/2010  Exit[employer]® Patient Information ©2014 [employer].

## 2012-09-15 ENCOUNTER — Other Ambulatory Visit (HOSPITAL_COMMUNITY)
Admission: RE | Admit: 2012-09-15 | Discharge: 2012-09-15 | Disposition: A | Discharge status: Home / Self Care | Payer: BC Managed Care – PPO | Source: Ambulatory Visit | Attending: Family Medicine | Admitting: Family Medicine

## 2012-09-15 ENCOUNTER — Encounter: Payer: Self-pay | Admitting: Family Medicine

## 2012-09-15 ENCOUNTER — Ambulatory Visit (INDEPENDENT_AMBULATORY_CARE_PROVIDER_SITE_OTHER): Payer: BC Managed Care – PPO | Admitting: Family Medicine

## 2012-09-15 VITALS — BP 120/80 | HR 79 | Temp 99.0°F | Ht 68.0 in | Wt 163.0 lb

## 2012-09-15 DIAGNOSIS — Z0000 Encounter for general adult medical examination without abnormal findings: Secondary | ICD-10-CM

## 2012-09-15 DIAGNOSIS — Z124 Encounter for screening for malignant neoplasm of cervix: Secondary | ICD-10-CM

## 2012-09-15 DIAGNOSIS — Z720 Tobacco use: Secondary | ICD-10-CM

## 2012-09-15 DIAGNOSIS — F17200 Nicotine dependence, unspecified, uncomplicated: Secondary | ICD-10-CM

## 2012-09-15 DIAGNOSIS — Z1239 Encounter for other screening for malignant neoplasm of breast: Secondary | ICD-10-CM

## 2012-09-15 DIAGNOSIS — Z01419 Encounter for gynecological examination (general) (routine) without abnormal findings: Secondary | ICD-10-CM | POA: Insufficient documentation

## 2012-09-15 DIAGNOSIS — Z23 Encounter for immunization: Secondary | ICD-10-CM

## 2012-09-15 DIAGNOSIS — Z1151 Encounter for screening for human papillomavirus (HPV): Secondary | ICD-10-CM | POA: Insufficient documentation

## 2012-09-15 DIAGNOSIS — G610 Guillain-Barre syndrome: Secondary | ICD-10-CM

## 2012-09-15 DIAGNOSIS — M329 Systemic lupus erythematosus, unspecified: Secondary | ICD-10-CM

## 2012-09-15 LAB — BASIC METABOLIC PANEL
BUN: 13 mg/dL (ref 6–23)
CO2: 25 mEq/L (ref 19–32)
Calcium: 9.2 mg/dL (ref 8.4–10.5)
Chloride: 109 mEq/L (ref 96–112)
Creatinine, Ser: 0.6 mg/dL (ref 0.4–1.2)
GFR: 138.06 mL/min (ref >60.00)
Glucose, Bld: 74 mg/dL (ref 70–99)
Potassium: 4 mEq/L (ref 3.5–5.1)
Sodium: 138 mEq/L (ref 135–145)

## 2012-09-15 LAB — CBC WITH DIFFERENTIAL/PLATELET
Basophils Absolute: 0 10*3/uL (ref 0.0–0.1)
Basophils Relative: 0.4 % (ref 0.0–3.0)
Eosinophils Absolute: 0.2 10*3/uL (ref 0.0–0.7)
Eosinophils Relative: 4.9 % (ref 0.0–5.0)
HCT: 35.2 % — ABNORMAL LOW (ref 36.0–46.0)
Hemoglobin: 11.8 g/dL — ABNORMAL LOW (ref 12.0–15.0)
Lymphocytes Relative: 35.6 % (ref 12.0–46.0)
Lymphs Abs: 1.4 10*3/uL (ref 0.7–4.0)
MCHC: 33.5 g/dL (ref 30.0–36.0)
MCV: 87.2 fl (ref 78.0–100.0)
Monocytes Absolute: 0.3 10*3/uL (ref 0.1–1.0)
Monocytes Relative: 7.4 % (ref 3.0–12.0)
Neutro Abs: 2.1 10*3/uL (ref 1.4–7.7)
Neutrophils Relative %: 51.7 % (ref 43.0–77.0)
Platelets: 209 10*3/uL (ref 150.0–400.0)
RBC: 4.03 Mil/uL (ref 3.87–5.11)
RDW: 14.5 % (ref 11.5–14.6)
WBC: 4 10*3/uL — ABNORMAL LOW (ref 4.5–10.5)

## 2012-09-15 LAB — HEPATIC FUNCTION PANEL
ALT: 42 U/L — ABNORMAL HIGH (ref 0–35)
AST: 33 U/L (ref 0–37)
Albumin: 3.7 g/dL (ref 3.5–5.2)
Alkaline Phosphatase: 74 U/L (ref 39–117)
Bilirubin, Direct: 0 mg/dL (ref 0.0–0.3)
Total Bilirubin: 0.4 mg/dL (ref 0.3–1.2)
Total Protein: 8.1 g/dL (ref 6.0–8.3)

## 2012-09-15 LAB — LDL CHOLESTEROL, DIRECT: Direct LDL: 141.8 mg/dL

## 2012-09-15 LAB — LIPID PANEL
Cholesterol: 209 mg/dL — ABNORMAL HIGH (ref 0–200)
HDL: 53.2 mg/dL (ref >39.00)
Total CHOL/HDL Ratio: 4
Triglycerides: 101 mg/dL (ref 0.0–149.0)
VLDL: 20.2 mg/dL (ref 0.0–40.0)

## 2012-09-15 LAB — TSH: TSH: 0.83 u[IU]/mL (ref 0.35–5.50)

## 2012-09-15 NOTE — Patient Instructions (Signed)
Preventive Care for Adults, Female A healthy lifestyle and preventive care can promote health and wellness. Preventive health guidelines for women include the following key practices.  A routine yearly physical is a good way to check with your caregiver about your health and preventive screening. It is a chance to share any concerns and updates on your health, and to receive a thorough exam.  Visit your dentist for a routine exam and preventive care every 6 months. Brush your teeth twice a day and floss once a day. Good oral hygiene prevents tooth decay and gum disease.  The frequency of eye exams is based on your age, health, family medical history, use of contact lenses, and other factors. Follow your caregiver's recommendations for frequency of eye exams.  Eat a healthy diet. Foods like vegetables, fruits, whole grains, low-fat dairy products, and lean protein foods contain the nutrients you need without too many calories. Decrease your intake of foods high in solid fats, added sugars, and salt. Eat the right amount of calories for you.Get information about a proper diet from your caregiver, if necessary.  Regular physical exercise is one of the most important things you can do for your health. Most adults should get at least 150 minutes of moderate-intensity exercise (any activity that increases your heart rate and causes you to sweat) each week. In addition, most adults need muscle-strengthening exercises on 2 or more days a week.  Maintain a healthy weight. The body mass index (BMI) is a screening tool to identify possible weight problems. It provides an estimate of body fat based on height and weight. Your caregiver can help determine your BMI, and can help you achieve or maintain a healthy weight.For adults 20 years and older:  A BMI below 18.5 is considered underweight.  A BMI of 18.5 to 24.9 is normal.  A BMI of 25 to 29.9 is considered overweight.  A BMI of 30 and above is  considered obese.  Maintain normal blood lipids and cholesterol levels by exercising and minimizing your intake of saturated fat. Eat a balanced diet with plenty of fruit and vegetables. Blood tests for lipids and cholesterol should begin at age 20 and be repeated every 5 years. If your lipid or cholesterol levels are high, you are over 50, or you are at high risk for heart disease, you may need your cholesterol levels checked more frequently.Ongoing high lipid and cholesterol levels should be treated with medicines if diet and exercise are not effective.  If you smoke, find out from your caregiver how to quit. If you do not use tobacco, do not start.  If you are pregnant, do not drink alcohol. If you are breastfeeding, be very cautious about drinking alcohol. If you are not pregnant and choose to drink alcohol, do not exceed 1 drink per day. One drink is considered to be 12 ounces (355 mL) of beer, 5 ounces (148 mL) of wine, or 1.5 ounces (44 mL) of liquor.  Avoid use of street drugs. Do not share needles with anyone. Ask for help if you need support or instructions about stopping the use of drugs.  High blood pressure causes heart disease and increases the risk of stroke. Your blood pressure should be checked at least every 1 to 2 years. Ongoing high blood pressure should be treated with medicines if weight loss and exercise are not effective.  If you are 55 to 51 years old, ask your caregiver if you should take aspirin to prevent strokes.  Diabetes   screening involves taking a blood sample to check your fasting blood sugar level. This should be done once every 3 years, after age 45, if you are within normal weight and without risk factors for diabetes. Testing should be considered at a younger age or be carried out more frequently if you are overweight and have at least 1 risk factor for diabetes.  Breast cancer screening is essential preventive care for women. You should practice "breast  self-awareness." This means understanding the normal appearance and feel of your breasts and may include breast self-examination. Any changes detected, no matter how small, should be reported to a caregiver. Women in their 20s and 30s should have a clinical breast exam (CBE) by a caregiver as part of a regular health exam every 1 to 3 years. After age 40, women should have a CBE every year. Starting at age 40, women should consider having a mammography (breast X-ray test) every year. Women who have a family history of breast cancer should talk to their caregiver about genetic screening. Women at a high risk of breast cancer should talk to their caregivers about having magnetic resonance imaging (MRI) and a mammography every year.  The Pap test is a screening test for cervical cancer. A Pap test can show cell changes on the cervix that might become cervical cancer if left untreated. A Pap test is a procedure in which cells are obtained and examined from the lower end of the uterus (cervix).  Women should have a Pap test starting at age 21.  Between ages 21 and 29, Pap tests should be repeated every 2 years.  Beginning at age 30, you should have a Pap test every 3 years as long as the past 3 Pap tests have been normal.  Some women have medical problems that increase the chance of getting cervical cancer. Talk to your caregiver about these problems. It is especially important to talk to your caregiver if a new problem develops soon after your last Pap test. In these cases, your caregiver may recommend more frequent screening and Pap tests.  The above recommendations are the same for women who have or have not gotten the vaccine for human papillomavirus (HPV).  If you had a hysterectomy for a problem that was not cancer or a condition that could lead to cancer, then you no longer need Pap tests. Even if you no longer need a Pap test, a regular exam is a good idea to make sure no other problems are  starting.  If you are between ages 65 and 70, and you have had normal Pap tests going back 10 years, you no longer need Pap tests. Even if you no longer need a Pap test, a regular exam is a good idea to make sure no other problems are starting.  If you have had past treatment for cervical cancer or a condition that could lead to cancer, you need Pap tests and screening for cancer for at least 20 years after your treatment.  If Pap tests have been discontinued, risk factors (such as a new sexual partner) need to be reassessed to determine if screening should be resumed.  The HPV test is an additional test that may be used for cervical cancer screening. The HPV test looks for the virus that can cause the cell changes on the cervix. The cells collected during the Pap test can be tested for HPV. The HPV test could be used to screen women aged 30 years and older, and should   be used in women of any age who have unclear Pap test results. After the age of 30, women should have HPV testing at the same frequency as a Pap test.  Colorectal cancer can be detected and often prevented. Most routine colorectal cancer screening begins at the age of 50 and continues through age 75. However, your caregiver may recommend screening at an earlier age if you have risk factors for colon cancer. On a yearly basis, your caregiver may provide home test kits to check for hidden blood in the stool. Use of a small camera at the end of a tube, to directly examine the colon (sigmoidoscopy or colonoscopy), can detect the earliest forms of colorectal cancer. Talk to your caregiver about this at age 50, when routine screening begins. Direct examination of the colon should be repeated every 5 to 10 years through age 75, unless early forms of pre-cancerous polyps or small growths are found.  Hepatitis C blood testing is recommended for all people born from 1945 through 1965 and any individual with known risks for hepatitis C.  Practice  safe sex. Use condoms and avoid high-risk sexual practices to reduce the spread of sexually transmitted infections (STIs). STIs include gonorrhea, chlamydia, syphilis, trichomonas, herpes, HPV, and human immunodeficiency virus (HIV). Herpes, HIV, and HPV are viral illnesses that have no cure. They can result in disability, cancer, and death. Sexually active women aged 25 and younger should be checked for chlamydia. Older women with new or multiple partners should also be tested for chlamydia. Testing for other STIs is recommended if you are sexually active and at increased risk.  Osteoporosis is a disease in which the bones lose minerals and strength with aging. This can result in serious bone fractures. The risk of osteoporosis can be identified using a bone density scan. Women ages 65 and over and women at risk for fractures or osteoporosis should discuss screening with their caregivers. Ask your caregiver whether you should take a calcium supplement or vitamin D to reduce the rate of osteoporosis.  Menopause can be associated with physical symptoms and risks. Hormone replacement therapy is available to decrease symptoms and risks. You should talk to your caregiver about whether hormone replacement therapy is right for you.  Use sunscreen with sun protection factor (SPF) of 30 or more. Apply sunscreen liberally and repeatedly throughout the day. You should seek shade when your shadow is shorter than you. Protect yourself by wearing long sleeves, pants, a wide-brimmed hat, and sunglasses year round, whenever you are outdoors.  Once a month, do a whole body skin exam, using a mirror to look at the skin on your back. Notify your caregiver of new moles, moles that have irregular borders, moles that are larger than a pencil eraser, or moles that have changed in shape or color.  Stay current with required immunizations.  Influenza. You need a dose every fall (or winter). The composition of the flu vaccine  changes each year, so being vaccinated once is not enough.  Pneumococcal polysaccharide. You need 1 to 2 doses if you smoke cigarettes or if you have certain chronic medical conditions. You need 1 dose at age 65 (or older) if you have never been vaccinated.  Tetanus, diphtheria, pertussis (Tdap, Td). Get 1 dose of Tdap vaccine if you are younger than age 65, are over 65 and have contact with an infant, are a healthcare worker, are pregnant, or simply want to be protected from whooping cough. After that, you need a Td   booster dose every 10 years. Consult your caregiver if you have not had at least 3 tetanus and diphtheria-containing shots sometime in your life or have a deep or dirty wound.  HPV. You need this vaccine if you are a woman age 26 or younger. The vaccine is given in 3 doses over 6 months.  Measles, mumps, rubella (MMR). You need at least 1 dose of MMR if you were born in 1957 or later. You may also need a second dose.  Meningococcal. If you are age 19 to 21 and a first-year college student living in a residence hall, or have one of several medical conditions, you need to get vaccinated against meningococcal disease. You may also need additional booster doses.  Zoster (shingles). If you are age 60 or older, you should get this vaccine.  Varicella (chickenpox). If you have never had chickenpox or you were vaccinated but received only 1 dose, talk to your caregiver to find out if you need this vaccine.  Hepatitis A. You need this vaccine if you have a specific risk factor for hepatitis A virus infection or you simply wish to be protected from this disease. The vaccine is usually given as 2 doses, 6 to 18 months apart.  Hepatitis B. You need this vaccine if you have a specific risk factor for hepatitis B virus infection or you simply wish to be protected from this disease. The vaccine is given in 3 doses, usually over 6 months. Preventive Services / Frequency Ages 19 to 39  Blood  pressure check.** / Every 1 to 2 years.  Lipid and cholesterol check.** / Every 5 years beginning at age 20.  Clinical breast exam.** / Every 3 years for women in their 20s and 30s.  Pap test.** / Every 2 years from ages 21 through 29. Every 3 years starting at age 30 through age 65 or 70 with a history of 3 consecutive normal Pap tests.  HPV screening.** / Every 3 years from ages 30 through ages 65 to 70 with a history of 3 consecutive normal Pap tests.  Hepatitis C blood test.** / For any individual with known risks for hepatitis C.  Skin self-exam. / Monthly.  Influenza immunization.** / Every year.  Pneumococcal polysaccharide immunization.** / 1 to 2 doses if you smoke cigarettes or if you have certain chronic medical conditions.  Tetanus, diphtheria, pertussis (Tdap, Td) immunization. / A one-time dose of Tdap vaccine. After that, you need a Td booster dose every 10 years.  HPV immunization. / 3 doses over 6 months, if you are 26 and younger.  Measles, mumps, rubella (MMR) immunization. / You need at least 1 dose of MMR if you were born in 1957 or later. You may also need a second dose.  Meningococcal immunization. / 1 dose if you are age 19 to 21 and a first-year college student living in a residence hall, or have one of several medical conditions, you need to get vaccinated against meningococcal disease. You may also need additional booster doses.  Varicella immunization.** / Consult your caregiver.  Hepatitis A immunization.** / Consult your caregiver. 2 doses, 6 to 18 months apart.  Hepatitis B immunization.** / Consult your caregiver. 3 doses usually over 6 months. Ages 40 to 64  Blood pressure check.** / Every 1 to 2 years.  Lipid and cholesterol check.** / Every 5 years beginning at age 20.  Clinical breast exam.** / Every year after age 40.  Mammogram.** / Every year beginning at age 40   and continuing for as long as you are in good health. Consult with your  caregiver.  Pap test.** / Every 3 years starting at age 30 through age 65 or 70 with a history of 3 consecutive normal Pap tests.  HPV screening.** / Every 3 years from ages 30 through ages 65 to 70 with a history of 3 consecutive normal Pap tests.  Fecal occult blood test (FOBT) of stool. / Every year beginning at age 50 and continuing until age 75. You may not need to do this test if you get a colonoscopy every 10 years.  Flexible sigmoidoscopy or colonoscopy.** / Every 5 years for a flexible sigmoidoscopy or every 10 years for a colonoscopy beginning at age 50 and continuing until age 75.  Hepatitis C blood test.** / For all people born from 1945 through 1965 and any individual with known risks for hepatitis C.  Skin self-exam. / Monthly.  Influenza immunization.** / Every year.  Pneumococcal polysaccharide immunization.** / 1 to 2 doses if you smoke cigarettes or if you have certain chronic medical conditions.  Tetanus, diphtheria, pertussis (Tdap, Td) immunization.** / A one-time dose of Tdap vaccine. After that, you need a Td booster dose every 10 years.  Measles, mumps, rubella (MMR) immunization. / You need at least 1 dose of MMR if you were born in 1957 or later. You may also need a second dose.  Varicella immunization.** / Consult your caregiver.  Meningococcal immunization.** / Consult your caregiver.  Hepatitis A immunization.** / Consult your caregiver. 2 doses, 6 to 18 months apart.  Hepatitis B immunization.** / Consult your caregiver. 3 doses, usually over 6 months. Ages 65 and over  Blood pressure check.** / Every 1 to 2 years.  Lipid and cholesterol check.** / Every 5 years beginning at age 20.  Clinical breast exam.** / Every year after age 40.  Mammogram.** / Every year beginning at age 40 and continuing for as long as you are in good health. Consult with your caregiver.  Pap test.** / Every 3 years starting at age 30 through age 65 or 70 with a 3  consecutive normal Pap tests. Testing can be stopped between 65 and 70 with 3 consecutive normal Pap tests and no abnormal Pap or HPV tests in the past 10 years.  HPV screening.** / Every 3 years from ages 30 through ages 65 or 70 with a history of 3 consecutive normal Pap tests. Testing can be stopped between 65 and 70 with 3 consecutive normal Pap tests and no abnormal Pap or HPV tests in the past 10 years.  Fecal occult blood test (FOBT) of stool. / Every year beginning at age 50 and continuing until age 75. You may not need to do this test if you get a colonoscopy every 10 years.  Flexible sigmoidoscopy or colonoscopy.** / Every 5 years for a flexible sigmoidoscopy or every 10 years for a colonoscopy beginning at age 50 and continuing until age 75.  Hepatitis C blood test.** / For all people born from 1945 through 1965 and any individual with known risks for hepatitis C.  Osteoporosis screening.** / A one-time screening for women ages 65 and over and women at risk for fractures or osteoporosis.  Skin self-exam. / Monthly.  Influenza immunization.** / Every year.  Pneumococcal polysaccharide immunization.** / 1 dose at age 65 (or older) if you have never been vaccinated.  Tetanus, diphtheria, pertussis (Tdap, Td) immunization. / A one-time dose of Tdap vaccine if you are over   65 and have contact with an infant, are a healthcare worker, or simply want to be protected from whooping cough. After that, you need a Td booster dose every 10 years.  Varicella immunization.** / Consult your caregiver.  Meningococcal immunization.** / Consult your caregiver.  Hepatitis A immunization.** / Consult your caregiver. 2 doses, 6 to 18 months apart.  Hepatitis B immunization.** / Check with your caregiver. 3 doses, usually over 6 months. ** Family history and personal history of risk and conditions may change your caregiver's recommendations. Document Released: 03/12/2001 Document Revised: 04/08/2011  Document Reviewed: 06/11/2010 [employer] Patient Information 2014 [employer].  

## 2012-09-15 NOTE — Progress Notes (Signed)
Subjective:     Samantha [name] is a 52 y.o. female and is here for a comprehensive physical exam. The patient reports no new problems.  History   Social History  . Marital Status: Single    Spouse Name: N/A    Number of Children: N/A  . Years of Education: N/A   Occupational History  . permanant disability [employer]   Social History Main Topics  . Smoking status: Current Every Day Smoker -- 0.01 packs/day for 35 years    Types: Cigarettes  . Smokeless tobacco: Never Used  . Alcohol Use: Yes     Comment: occassional  . Drug Use: No  . Sexual Activity: Not Currently    Partners: Male   Other Topics Concern  . Not on file   Social History Narrative   Exercise--- walking   Health Maintenance  Topic Date Due  . Tetanus/tdap  06/18/1980  . Mammogram  06/19/2011  . Colonoscopy  06/19/2011  . Pap Smear  09/16/2015    The following portions of the patient's history were reviewed and updated as appropriate:  She  has a past medical history of Unspecified essential hypertension; Backache, unspecified; Altered mental status; Gastric ulcer, unspecified as acute or chronic, without mention of hemorrhage, perforation, or obstruction; Helicobacter pylori (H. pylori); and Tobacco abuse. She  does not have any pertinent problems on file. She  has no past surgical history on file. Her family history includes Diabetes in her sister; Hypertension in her mother. She  reports that she has been smoking Cigarettes.  She has a .35 pack-year smoking history. She has never used smokeless tobacco. She reports that  drinks alcohol. She reports that she does not use illicit drugs. She has a current medication list which includes the following prescription(s): cyclobenzaprine, hydroxychloroquine, immune globulin 10%, multivitamin, mycophenolate, nicotine, NONFORMULARY OR COMPOUNDED ITEM, and oxycodone-acetaminophen. Current Outpatient Prescriptions on File Prior to Visit  Medication Sig  Dispense Refill  . cyclobenzaprine (FLEXERIL) 10 MG tablet Take 1 tablet (10 mg total) by mouth 3 (three) times daily as needed.  30 tablet  2  . hydroxychloroquine (PLAQUENIL) 200 MG tablet Take 200 mg by mouth 2 (two) times daily.      . Immune Globulin 10% (PRIVIGEN) 10 GM/100ML SOLN Inject 1 g/kg into the vein once.      . Multiple Vitamin (MULTIVITAMIN) tablet Take 1 tablet by mouth daily.      . mycophenolate (CELLCEPT) 500 MG tablet Take 1,500 mg by mouth 2 (two) times daily.       . nicotine (NICOTROL) 10 MG inhaler Inhale 1 puff into the lungs as needed for smoking cessation.  42 each  0  . NONFORMULARY OR COMPOUNDED ITEM Cane As directed  1 each  0  . oxyCODONE-acetaminophen (PERCOCET) 10-325 MG per tablet Take 1 tablet by mouth every 8 (eight) hours as needed.  60 tablet  0   No current facility-administered medications on file prior to visit.   She is allergic to tramadol..  Review of Systems Review of Systems  Constitutional: Negative for activity change, appetite change and fatigue.  HENT: Negative for hearing loss, congestion, tinnitus and ear discharge.  dentist -due Eyes: Negative for visual disturbance (see optho q1y -- vision corrected to 20/20 with glasses).  Respiratory: Negative for cough, chest tightness and shortness of breath.   Cardiovascular: Negative for chest pain, palpitations and leg swelling.  Gastrointestinal: Negative for abdominal pain, diarrhea, constipation and abdominal distention.  Genitourinary: Negative for  urgency, frequency, decreased urine volume and difficulty urinating.  Musculoskeletal: Negative for back pain, arthralgias--- still drags left leg some Skin: Negative for color change, pallor and rash.  Neurological: Negative for dizziness, light-headedness, numbness and headaches.  Hematological: Negative for adenopathy. Does not bruise/bleed easily.  Psychiatric/Behavioral: Negative for suicidal ideas, confusion, sleep disturbance,  self-injury, dysphoric mood, decreased concentration and agitation.       Objective:    BP 120/80  Pulse 79  Temp(Src) 99 F (37.2 C) (Oral)  Ht 5\' 8"  (1.727 m)  Wt 163 lb (73.936 kg)  BMI 24.79 kg/m2  SpO2 98%  LMP 04/11/2011 General appearance: alert, cooperative, appears stated age and no distress Head: Normocephalic, without obvious abnormality, atraumatic Eyes: conjunctivae/corneas clear. PERRL, EOM's intact. Fundi benign. Ears: normal TM's and external ear canals both ears Nose: Nares normal. Septum midline. Mucosa normal. No drainage or sinus tenderness. Throat: lips, mucosa, and tongue normal; teeth and gums normal Neck: no adenopathy, no carotid bruit, no JVD, supple, symmetrical, trachea midline and thyroid not enlarged, symmetric, no tenderness/mass/nodules Back: symmetric, no curvature. ROM normal. No CVA tenderness. Lungs: clear to auscultation bilaterally Breasts: normal appearance, no masses or tenderness Heart: regular rate and rhythm, S1, S2 normal, no murmur, click, rub or gallop Abdomen: soft, non-tender; bowel sounds normal; no masses,  no organomegaly Pelvic: deferred Extremities: extremities normal, atraumatic, no cyanosis or edema Pulses: 2+ and symmetric Skin: Skin color, texture, turgor normal. No rashes or lesions Lymph nodes: Cervical, supraclavicular, and axillary nodes normal. Neurologic: Alert and oriented X 3, normal strength and tone. Normal symmetric reflexes. Normal coordination and gait Psych--no anxiety, no depression      Assessment:    Healthy female exam.      Plan:    ghm utd Check labs See After Visit Summary for Counseling Recommendations

## 2012-09-16 ENCOUNTER — Encounter: Payer: Self-pay | Admitting: Family Medicine

## 2012-09-16 NOTE — Assessment & Plan Note (Signed)
Pt working on cutting down

## 2012-09-16 NOTE — Assessment & Plan Note (Signed)
Per PT , and neuro

## 2012-09-16 NOTE — Assessment & Plan Note (Signed)
Per rheum 

## 2012-10-22 ENCOUNTER — Encounter: Payer: Self-pay | Admitting: Gastroenterology

## 2013-02-17 IMAGING — CR DG CHEST 1V PORT
1 series · 1 of 1 positions shown · non-contrast
Comparison: 02/26/2011

CLINICAL DATA: Body ache, headache and altered mental status.

PORTABLE CHEST - 1 VIEW

[x chest ap]
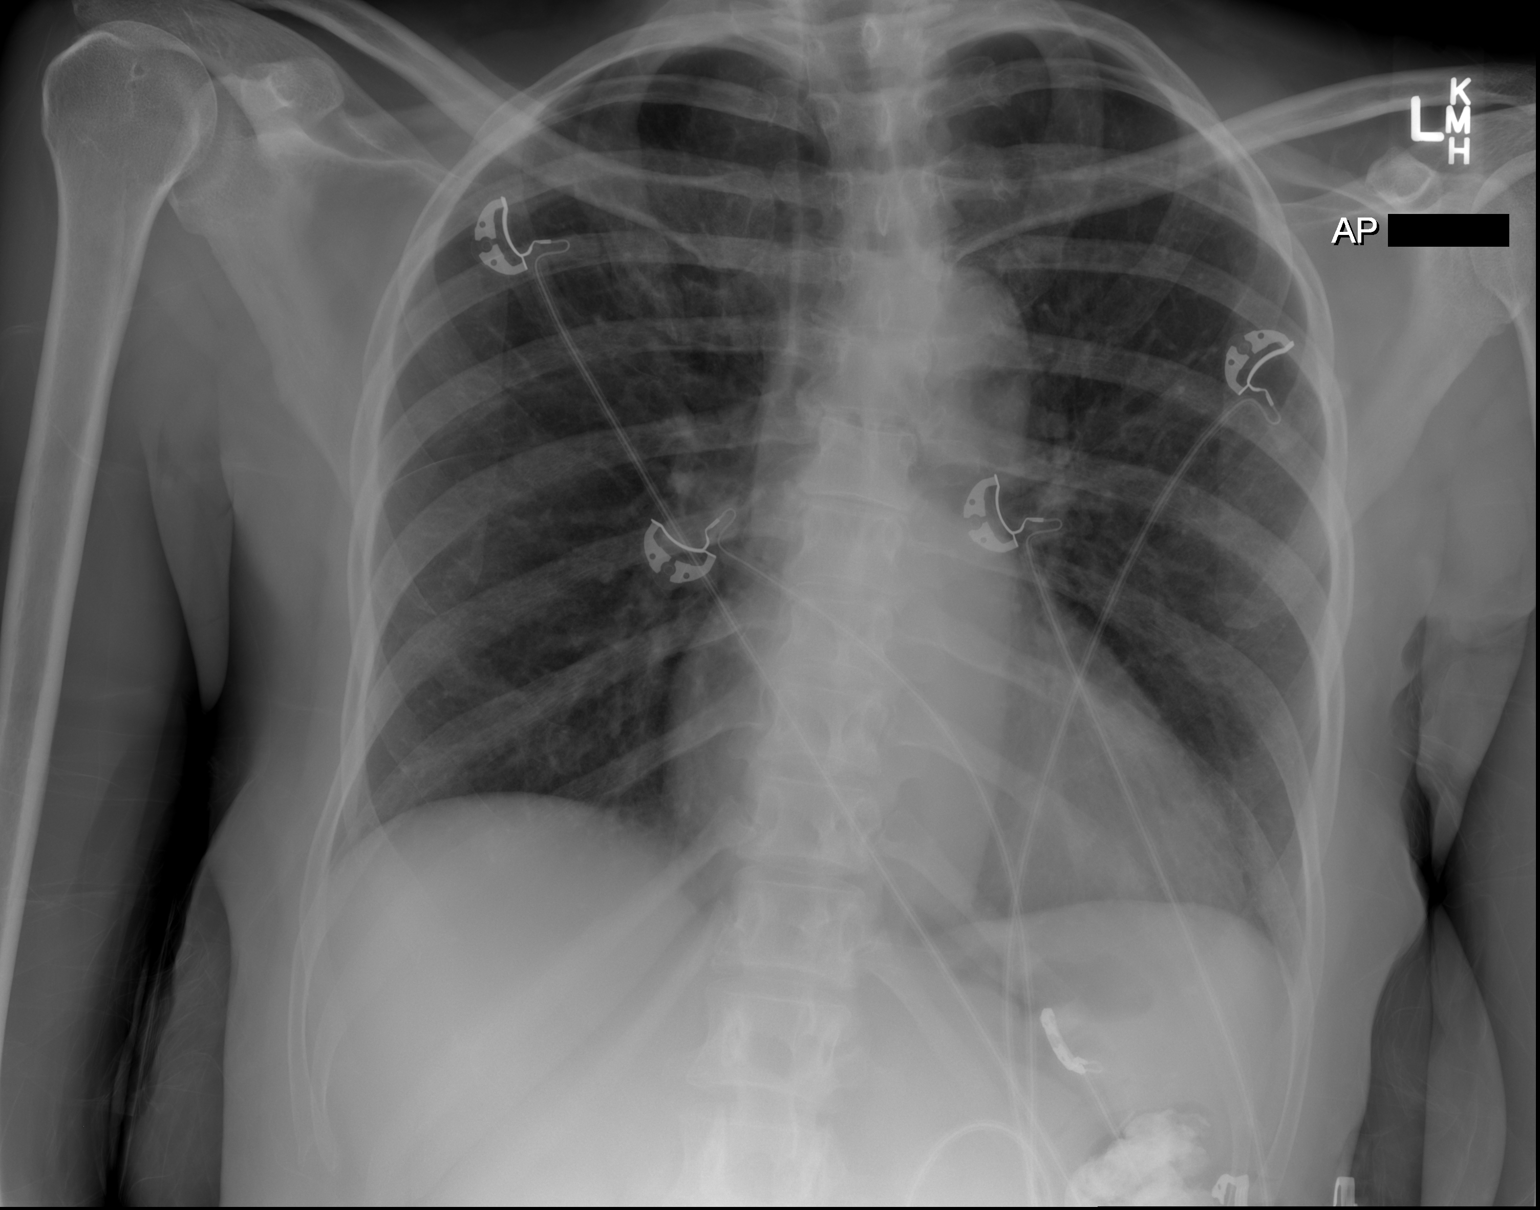

[1 of 1 positions shown; findings below may reference images not displayed]

FINDINGS: No pulmonary infiltrate, edema or pleural fluid
identified.  Heart size is normal.  Stable scoliosis of the
thoracic spine.
IMPRESSION: No active disease.

## 2013-02-17 IMAGING — CT CT HEAD W/O CM
2 series · 16 of 30 positions shown, 20 images · non-contrast
Comparison: 02/26/2011

CLINICAL DATA: Altered mental status, frontal headache

CT HEAD WITHOUT CONTRAST
TECHNIQUE: Contiguous axial images were obtained from the base of
the skull through the vertex without contrast.

[Series 2: head w/o · axial · non-contrast · 0.43mm/px · z∈[+1305,+1425]mm · 13 of 28 slices shown, 17 images]
[im 2/28  brain]
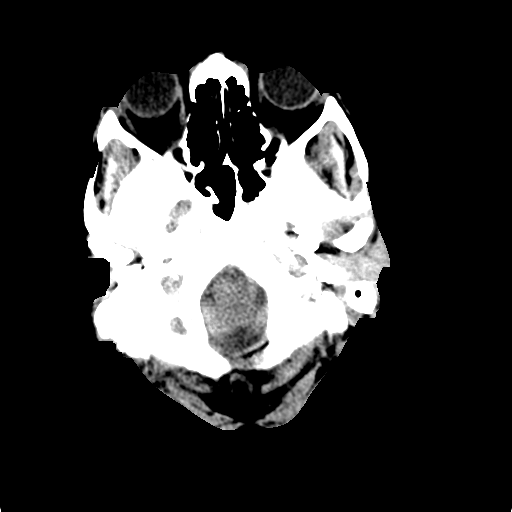
[im 2/28  bone]
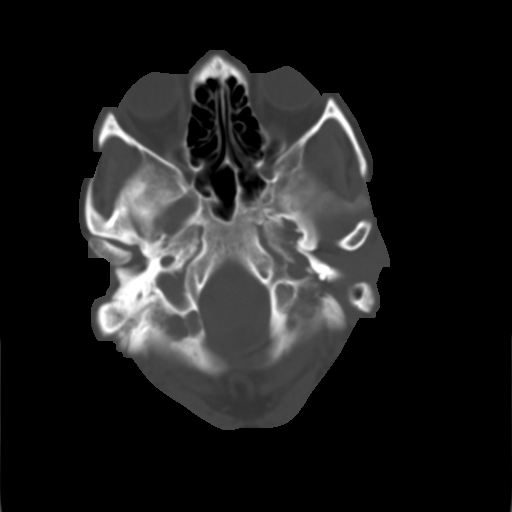
[im 4/28  brain]
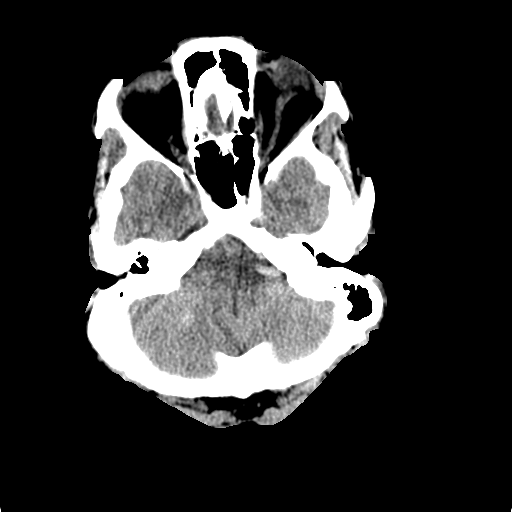
[im 6/28  brain]
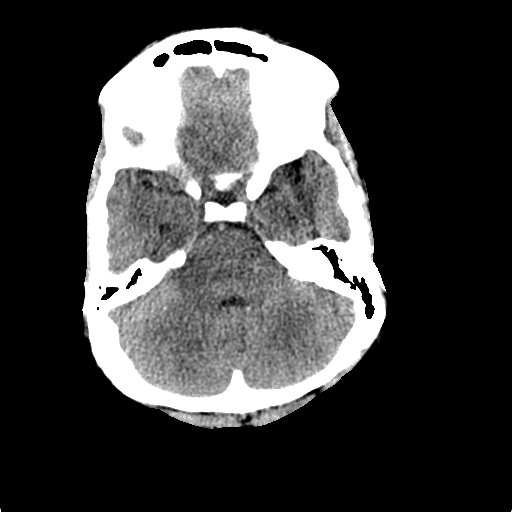
[im 8/28  brain]
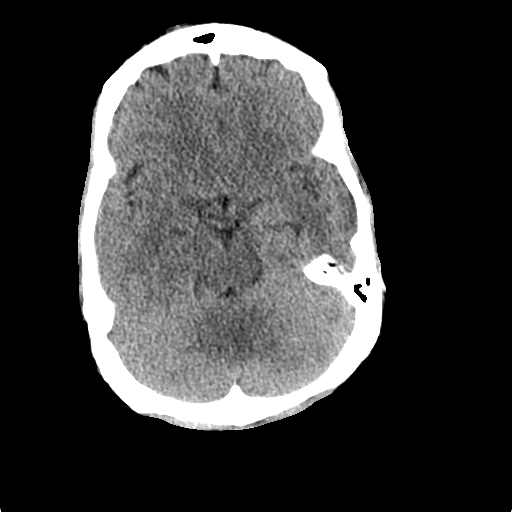
[im 10/28  brain]
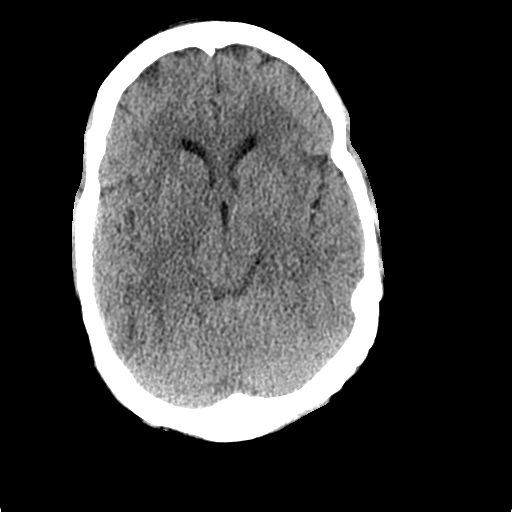
[im 10/28  bone]
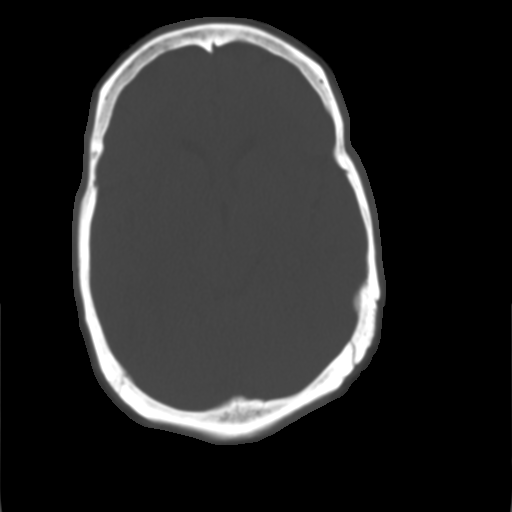
[im 12/28  brain]
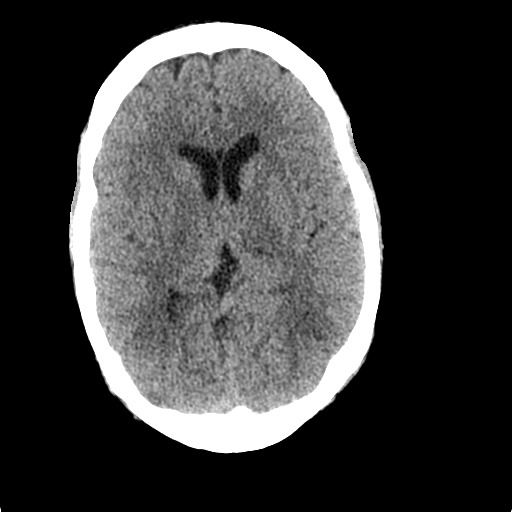
[im 14/28  brain]
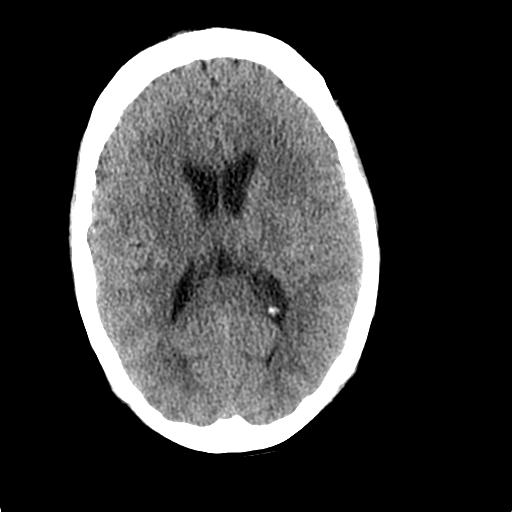
[im 16/28  brain]
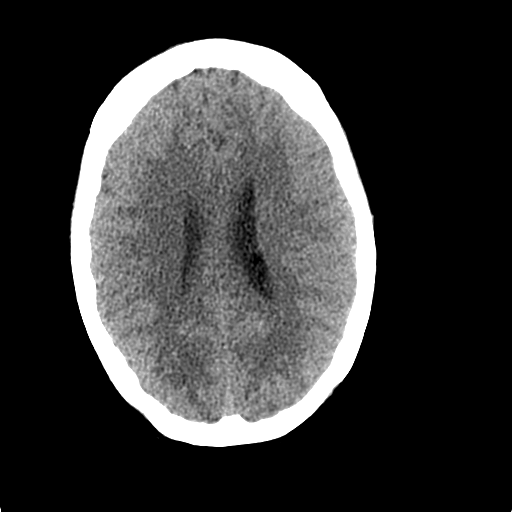
[im 18/28  brain]
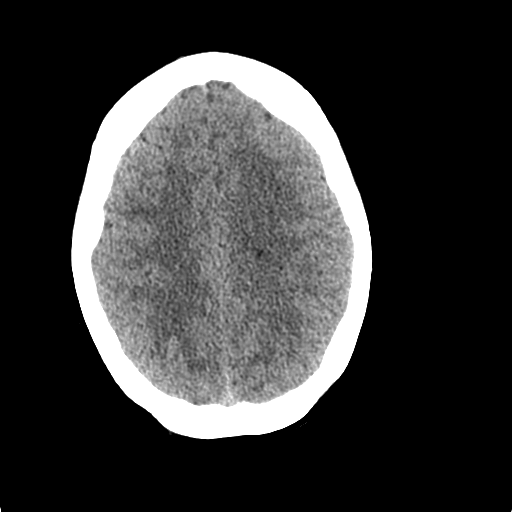
[im 18/28  bone]
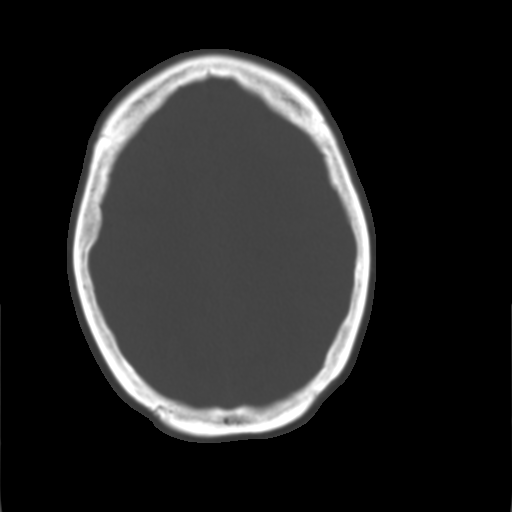
[im 20/28  brain]
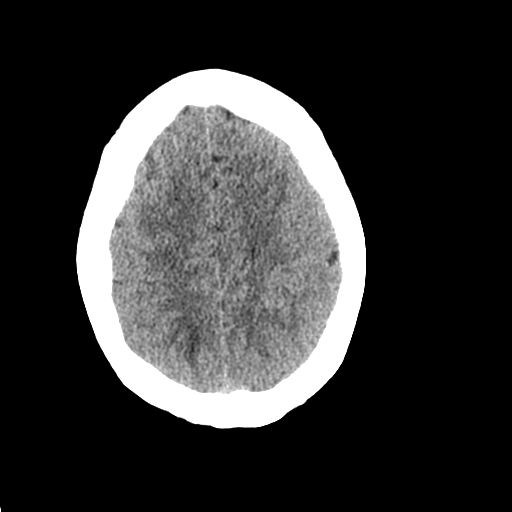
[im 22/28  brain]
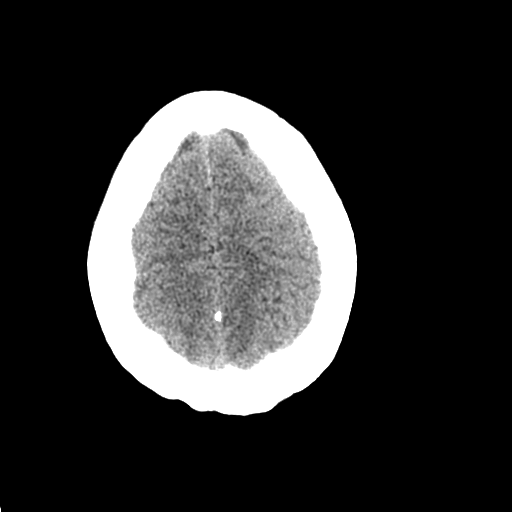
[im 24/28  brain]
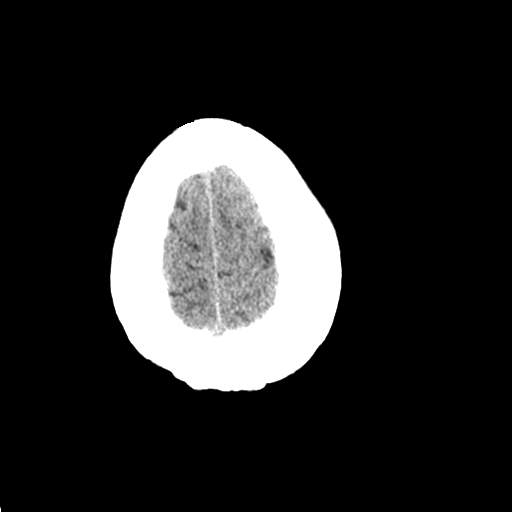
[im 26/28  brain]
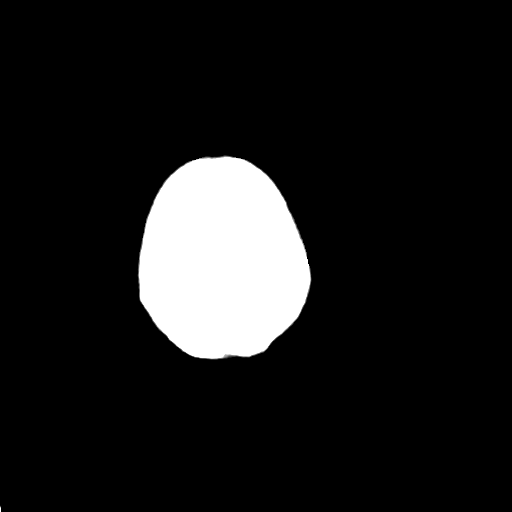
[im 26/28  bone]
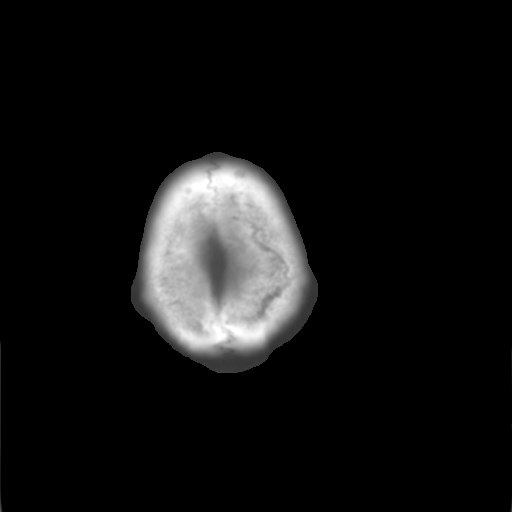

[Series 3: bone windows · axial · 0.43mm/px · z∈[+1305,+1345]mm · 3 of 28 slices shown]
[im 2/28  bone]
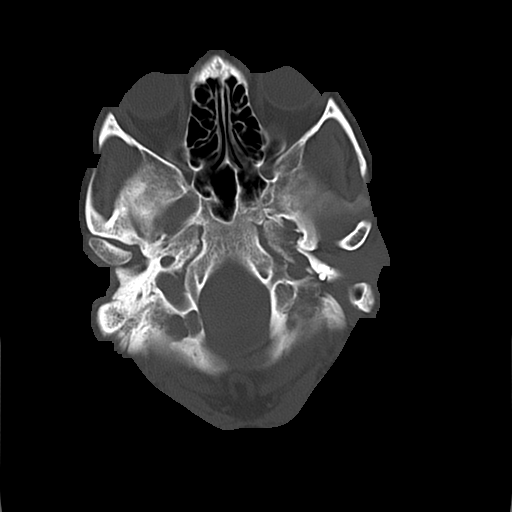
[im 6/28  bone]
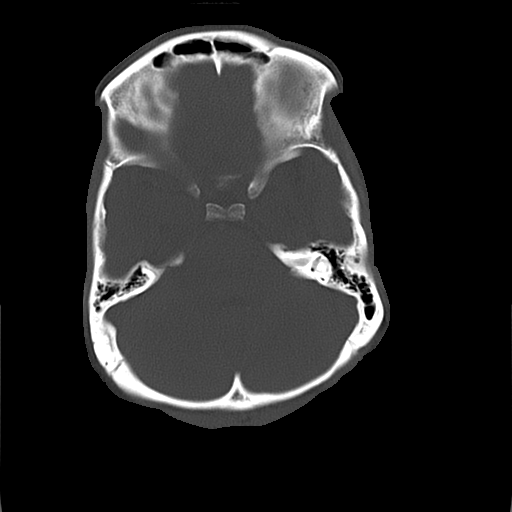
[im 10/28  bone]
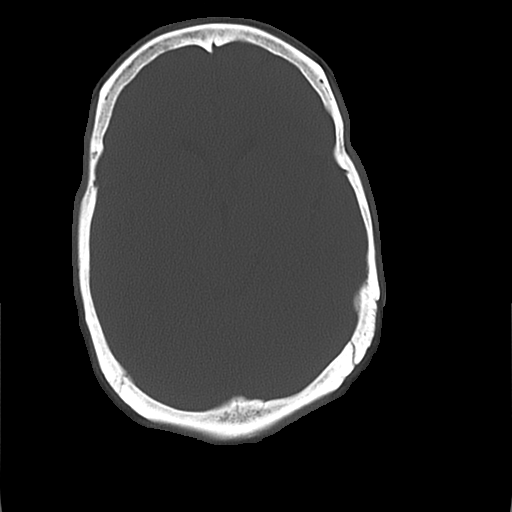

[16 of 30 positions shown; findings below may reference images not displayed]

FINDINGS: No skull fracture is noted.  Paranasal sinuses and
mastoid air cells are unremarkable.  No intracranial hemorrhage,
mass effect or midline shift.  No intra or extra-axial fluid
collection.  Ventricular size is stable from prior exam.  No acute
infarction.  No mass lesion is noted on this unenhanced scan.
IMPRESSION: No acute intracranial abnormality.  No significant change.

## 2013-08-16 ENCOUNTER — Ambulatory Visit
Admission: RE | Admit: 2013-08-16 | Discharge: 2013-08-16 | Disposition: A | Discharge status: Home / Self Care | Payer: Medicare Other | Source: Ambulatory Visit | Attending: Family Medicine | Admitting: Family Medicine

## 2013-08-16 DIAGNOSIS — Z1239 Encounter for other screening for malignant neoplasm of breast: Secondary | ICD-10-CM

## 2013-08-17 ENCOUNTER — Other Ambulatory Visit: Payer: Self-pay | Admitting: Family Medicine

## 2013-08-17 DIAGNOSIS — R928 Other abnormal and inconclusive findings on diagnostic imaging of breast: Secondary | ICD-10-CM

## 2013-08-25 ENCOUNTER — Ambulatory Visit
Admission: RE | Admit: 2013-08-25 | Discharge: 2013-08-25 | Disposition: A | Discharge status: Home / Self Care | Payer: Medicare Other | Source: Ambulatory Visit | Attending: Family Medicine | Admitting: Family Medicine

## 2013-08-25 ENCOUNTER — Encounter (INDEPENDENT_AMBULATORY_CARE_PROVIDER_SITE_OTHER): Payer: Self-pay

## 2013-08-25 DIAGNOSIS — R928 Other abnormal and inconclusive findings on diagnostic imaging of breast: Secondary | ICD-10-CM

## 2013-09-27 ENCOUNTER — Other Ambulatory Visit (HOSPITAL_COMMUNITY): Payer: Self-pay | Admitting: *Deleted

## 2013-09-28 ENCOUNTER — Encounter (HOSPITAL_COMMUNITY)
Admission: RE | Admit: 2013-09-28 | Discharge: 2013-09-28 | Disposition: A | Discharge status: Home / Self Care | Payer: Medicare Other | Source: Ambulatory Visit | Attending: Internal Medicine | Admitting: Internal Medicine

## 2013-09-28 DIAGNOSIS — M329 Systemic lupus erythematosus, unspecified: Secondary | ICD-10-CM | POA: Insufficient documentation

## 2013-09-28 DIAGNOSIS — G609 Hereditary and idiopathic neuropathy, unspecified: Secondary | ICD-10-CM | POA: Insufficient documentation

## 2013-09-28 MED ORDER — DEXTROSE 5 % IV SOLN
INTRAVENOUS | Status: DC
  Administered 2013-09-28: 13:00:00 via INTRAVENOUS

## 2013-09-28 MED ORDER — ACETAMINOPHEN 325 MG PO TABS
650.0000 mg | ORAL_TABLET | Freq: Four times a day (QID) | ORAL | Status: DC | PRN
  Administered 2013-09-28: 650 mg via ORAL

## 2013-09-28 MED ORDER — ACETAMINOPHEN 325 MG PO TABS
ORAL_TABLET | ORAL | Status: AC
  Filled 2013-09-28: qty 2

## 2013-09-28 MED ORDER — DIPHENHYDRAMINE HCL 25 MG PO CAPS
25.0000 mg | ORAL_CAPSULE | ORAL | Status: DC
  Administered 2013-09-28: 25 mg via ORAL

## 2013-09-28 MED ORDER — DIPHENHYDRAMINE HCL 25 MG PO CAPS
ORAL_CAPSULE | ORAL | Status: AC
  Filled 2013-09-28: qty 1

## 2013-09-28 MED ORDER — IMMUNE GLOBULIN (HUMAN) 10 GM/100ML IV SOLN
70.0000 g | INTRAVENOUS | Status: DC
  Filled 2013-09-28: qty 700

## 2013-09-28 MED ORDER — IMMUNE GLOBULIN (HUMAN) 10 GM/100ML IV SOLN
70.0000 g | INTRAVENOUS | Status: DC
  Administered 2013-09-28: 70 g via INTRAVENOUS
  Filled 2013-09-28: qty 700

## 2013-11-09 ENCOUNTER — Inpatient Hospital Stay (HOSPITAL_COMMUNITY): Admission: RE | Admit: 2013-11-09 | Payer: Medicare Other | Source: Ambulatory Visit

## 2013-11-11 ENCOUNTER — Encounter (HOSPITAL_COMMUNITY)
Admission: RE | Admit: 2013-11-11 | Discharge: 2013-11-11 | Disposition: A | Discharge status: Home / Self Care | Payer: Medicare Other | Source: Ambulatory Visit | Attending: Internal Medicine | Admitting: Internal Medicine

## 2013-11-11 DIAGNOSIS — M329 Systemic lupus erythematosus, unspecified: Secondary | ICD-10-CM | POA: Insufficient documentation

## 2013-11-11 DIAGNOSIS — G609 Hereditary and idiopathic neuropathy, unspecified: Secondary | ICD-10-CM | POA: Insufficient documentation

## 2013-11-11 MED ORDER — ACETAMINOPHEN 325 MG PO TABS
ORAL_TABLET | ORAL | Status: AC
  Administered 2013-11-11: 650 mg
  Filled 2013-11-11: qty 2

## 2013-11-11 MED ORDER — DEXTROSE 5 % IV SOLN
INTRAVENOUS | Status: DC
  Administered 2013-11-11: 11:00:00 via INTRAVENOUS

## 2013-11-11 MED ORDER — DIPHENHYDRAMINE HCL 25 MG PO TABS
25.0000 mg | ORAL_TABLET | ORAL | Status: DC
  Administered 2013-11-11: 25 mg via ORAL
  Filled 2013-11-11: qty 1

## 2013-11-11 MED ORDER — IMMUNE GLOBULIN (HUMAN) 1 GM/10ML IV SOLN
70.0000 g | INTRAVENOUS | Status: AC
  Administered 2013-11-11: 70 g via INTRAVENOUS
  Filled 2013-11-11: qty 700

## 2013-11-11 MED ORDER — IMMUNE GLOBULIN (HUMAN) 1 GM/10ML IV SOLN
70.0000 g | INTRAVENOUS | Status: DC
  Filled 2013-11-11: qty 700

## 2013-11-11 MED ORDER — DIPHENHYDRAMINE HCL 25 MG PO CAPS
ORAL_CAPSULE | ORAL | Status: AC
  Filled 2013-11-11: qty 1

## 2013-11-11 MED ORDER — ACETAMINOPHEN 325 MG PO TABS: 650.0000 mg | ORAL_TABLET | Freq: Four times a day (QID) | ORAL | Status: DC | PRN

## 2013-12-07 ENCOUNTER — Encounter (HOSPITAL_COMMUNITY)
Admission: RE | Admit: 2013-12-07 | Discharge: 2013-12-07 | Disposition: A | Discharge status: Home / Self Care | Payer: Medicare Other | Source: Ambulatory Visit | Attending: Internal Medicine | Admitting: Internal Medicine

## 2013-12-07 DIAGNOSIS — G609 Hereditary and idiopathic neuropathy, unspecified: Secondary | ICD-10-CM | POA: Diagnosis not present

## 2013-12-07 DIAGNOSIS — M329 Systemic lupus erythematosus, unspecified: Secondary | ICD-10-CM | POA: Insufficient documentation

## 2013-12-07 MED ORDER — DIPHENHYDRAMINE HCL 25 MG PO CAPS
ORAL_CAPSULE | ORAL | Status: AC
  Filled 2013-12-07: qty 1

## 2013-12-07 MED ORDER — ACETAMINOPHEN 325 MG PO TABS
ORAL_TABLET | ORAL | Status: AC
  Filled 2013-12-07: qty 2

## 2013-12-07 MED ORDER — IMMUNE GLOBULIN (HUMAN) 1 GM/10ML IV SOLN
70.0000 g | INTRAVENOUS | Status: AC
  Administered 2013-12-07: 70 g via INTRAVENOUS
  Filled 2013-12-07: qty 700

## 2013-12-07 MED ORDER — DIPHENHYDRAMINE HCL 25 MG PO TABS
25.0000 mg | ORAL_TABLET | ORAL | Status: AC
  Administered 2013-12-07: 25 mg via ORAL
  Filled 2013-12-07: qty 1

## 2013-12-07 MED ORDER — ACETAMINOPHEN 325 MG PO TABS
650.0000 mg | ORAL_TABLET | Freq: Four times a day (QID) | ORAL | Status: DC | PRN
  Administered 2013-12-07: 650 mg via ORAL

## 2013-12-07 MED ORDER — DEXTROSE 5 % IV SOLN
INTRAVENOUS | Status: AC
Start: 2013-12-07 — End: 2013-12-07
  Administered 2013-12-07: 10:00:00 via INTRAVENOUS

## 2013-12-20 ENCOUNTER — Ambulatory Visit (INDEPENDENT_AMBULATORY_CARE_PROVIDER_SITE_OTHER): Payer: Medicare Other | Admitting: Family Medicine

## 2013-12-20 ENCOUNTER — Encounter: Payer: Self-pay | Admitting: Family Medicine

## 2013-12-20 VITALS — BP 126/76 | HR 67 | Temp 97.8°F | Wt 146.0 lb

## 2013-12-20 DIAGNOSIS — M329 Systemic lupus erythematosus, unspecified: Secondary | ICD-10-CM

## 2013-12-20 DIAGNOSIS — M545 Low back pain, unspecified: Secondary | ICD-10-CM

## 2013-12-20 DIAGNOSIS — G610 Guillain-Barre syndrome: Secondary | ICD-10-CM

## 2013-12-20 DIAGNOSIS — Z0000 Encounter for general adult medical examination without abnormal findings: Secondary | ICD-10-CM

## 2013-12-20 DIAGNOSIS — E785 Hyperlipidemia, unspecified: Secondary | ICD-10-CM

## 2013-12-20 DIAGNOSIS — D508 Other iron deficiency anemias: Secondary | ICD-10-CM

## 2013-12-20 LAB — CBC WITH DIFFERENTIAL/PLATELET
Basophils Absolute: 0 10*3/uL (ref 0.0–0.1)
Basophils Relative: 0.6 % (ref 0.0–3.0)
Eosinophils Absolute: 0.1 10*3/uL (ref 0.0–0.7)
Eosinophils Relative: 3.4 % (ref 0.0–5.0)
HCT: 39.8 % (ref 36.0–46.0)
Hemoglobin: 12.9 g/dL (ref 12.0–15.0)
Lymphocytes Relative: 33.2 % (ref 12.0–46.0)
Lymphs Abs: 1.5 10*3/uL (ref 0.7–4.0)
MCHC: 32.6 g/dL (ref 30.0–36.0)
MCV: 87.8 fl (ref 78.0–100.0)
Monocytes Absolute: 0.4 10*3/uL (ref 0.1–1.0)
Monocytes Relative: 9.8 % (ref 3.0–12.0)
Neutro Abs: 2.3 10*3/uL (ref 1.4–7.7)
Neutrophils Relative %: 53 % (ref 43.0–77.0)
Platelets: 165 10*3/uL (ref 150.0–400.0)
RBC: 4.53 Mil/uL (ref 3.87–5.11)
RDW: 14.4 % (ref 11.5–15.5)
WBC: 4.4 10*3/uL (ref 4.0–10.5)

## 2013-12-20 LAB — POCT URINALYSIS DIPSTICK
Bilirubin, UA: NEGATIVE
Blood, UA: NEGATIVE
Glucose, UA: NEGATIVE
Ketones, UA: NEGATIVE
Leukocytes, UA: NEGATIVE
Nitrite, UA: NEGATIVE
Spec Grav, UA: >=1.03
Urobilinogen, UA: 0.2
pH, UA: 5

## 2013-12-20 LAB — LIPID PANEL
Cholesterol: 210 mg/dL — ABNORMAL HIGH (ref 0–200)
HDL: 57.9 mg/dL (ref >39.00)
LDL Cholesterol: 136 mg/dL — ABNORMAL HIGH (ref 0–99)
NonHDL: 152.1
Total CHOL/HDL Ratio: 4
Triglycerides: 80 mg/dL (ref 0.0–149.0)
VLDL: 16 mg/dL (ref 0.0–40.0)

## 2013-12-20 LAB — BASIC METABOLIC PANEL
BUN: 19 mg/dL (ref 6–23)
CO2: 21 mEq/L (ref 19–32)
Calcium: 9 mg/dL (ref 8.4–10.5)
Chloride: 108 mEq/L (ref 96–112)
Creatinine, Ser: 0.7 mg/dL (ref 0.4–1.2)
GFR: 110.95 mL/min (ref >60.00)
Glucose, Bld: 78 mg/dL (ref 70–99)
Potassium: 4 mEq/L (ref 3.5–5.1)
Sodium: 139 mEq/L (ref 135–145)

## 2013-12-20 LAB — HEPATIC FUNCTION PANEL
ALT: 22 U/L (ref 0–35)
AST: 33 U/L (ref 0–37)
Albumin: 3.7 g/dL (ref 3.5–5.2)
Alkaline Phosphatase: 66 U/L (ref 39–117)
Bilirubin, Direct: 0 mg/dL (ref 0.0–0.3)
Total Bilirubin: 0.3 mg/dL (ref 0.2–1.2)
Total Protein: 7.9 g/dL (ref 6.0–8.3)

## 2013-12-20 MED ORDER — OXYCODONE-ACETAMINOPHEN 10-325 MG PO TABS: 1.0000 | ORAL_TABLET | Freq: Three times a day (TID) | ORAL | Status: DC | PRN

## 2013-12-20 NOTE — Patient Instructions (Signed)

## 2013-12-20 NOTE — Progress Notes (Signed)
Subjective:    Patient ID: Samantha [name], female    DOB: [birth year], 53 y.o.   MRN: [id number]  HPI Pt is here to f/u and have labs.  She has been doing well.  She is requesting a rheum in [place] so she does not have to drive to [facility] anymore.  No complaints.    Review of Systems  As above  Past Medical History  Diagnosis Date  . Unspecified essential hypertension   . Backache, unspecified   . Altered mental status   . Gastric ulcer, unspecified as acute or chronic, without mention of hemorrhage, perforation, or obstruction   . Helicobacter pylori (H. pylori)   . Tobacco abuse    History   Social History  . Marital Status: Single    Spouse Name: N/A    Number of Children: N/A  . Years of Education: N/A   Occupational History  . permanant disability [employer]   Social History Main Topics  . Smoking status: Current Every Day Smoker -- 0.01 packs/day for 35 years    Types: Cigarettes  . Smokeless tobacco: Never Used  . Alcohol Use: Yes     Comment: occassional  . Drug Use: No  . Sexual Activity:    Partners: Male   Other Topics Concern  . Not on file   Social History Narrative   Exercise--- walking   Current Outpatient Prescriptions  Medication Sig Dispense Refill  . cyclobenzaprine (FLEXERIL) 10 MG tablet Take 1 tablet (10 mg total) by mouth 3 (three) times daily as needed. 30 tablet 2  . hydroxychloroquine (PLAQUENIL) 200 MG tablet Take 200 mg by mouth 2 (two) times daily.    . Immune Globulin 10% (PRIVIGEN) 10 GM/100ML SOLN Inject 1 g/kg into the vein once.    . Multiple Vitamin (MULTIVITAMIN) tablet Take 1 tablet by mouth daily.    . mycophenolate (CELLCEPT) 500 MG tablet Take 1,500 mg by mouth 2 (two) times daily.     . nicotine (NICOTROL) 10 MG inhaler Inhale 1 puff into the lungs as needed for smoking cessation. 42 each 0  . NONFORMULARY OR COMPOUNDED ITEM Cane As directed 1 each 0  . oxyCODONE-acetaminophen (PERCOCET) 10-325 MG per tablet Take 1  tablet by mouth every 8 (eight) hours as needed. 60 tablet 0   No current facility-administered medications for this visit.   Family History  Problem Relation Age of Onset  . Diabetes Sister   . Hypertension Mother      Objective:   Physical Exam BP 126/76 mmHg  Pulse 67  Temp(Src) 97.8 F (36.6 C) (Oral)  Wt 146 lb (66.225 kg)  SpO2 98%  LMP 04/11/2011 General appearance: alert, cooperative, appears stated age and no distress Neck: no adenopathy, supple, symmetrical, trachea midline and thyroid not enlarged, symmetric, no tenderness/mass/nodules Lungs: clear to auscultation bilaterally Heart: S1, S2 normal Extremities: extremities normal, atraumatic, no cyanosis or edema Neuro-- still walks with limp but no more cane      Assessment & Plan:  1. 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 Hyperlipidemia Check labs - Hepatic function panel - Lipid panel  4. Other iron deficiency anemias Check labs   - Basic metabolic panel - CBC with Differential - POCT urinalysis dipstick  5. Preventative health care   - Ambulatory referral to Gastroenterology  6. Midline low back pain without sciatica  Refill med-- she mostly needs pain med for riding in car -  oxyCODONE-acetaminophen (PERCOCET) 10-325 MG per tablet; Take 1 tablet by mouth every 8 (eight) hours as needed.  Dispense: 60 tablet; Refill: 0

## 2013-12-20 NOTE — Progress Notes (Signed)
Pre visit review using our clinic review tool, if applicable. No additional management support is needed unless otherwise documented below in the visit note. 

## 2013-12-21 ENCOUNTER — Encounter: Payer: Self-pay | Admitting: Gastroenterology

## 2013-12-21 ENCOUNTER — Telehealth: Payer: Self-pay | Admitting: Family Medicine

## 2013-12-21 NOTE — Telephone Encounter (Signed)
emmi mailed  °

## 2014-01-04 ENCOUNTER — Encounter (HOSPITAL_COMMUNITY)
Admission: RE | Admit: 2014-01-04 | Discharge: 2014-01-04 | Disposition: A | Discharge status: Home / Self Care | Payer: Medicare Other | Source: Ambulatory Visit | Attending: Internal Medicine | Admitting: Internal Medicine

## 2014-01-04 DIAGNOSIS — M329 Systemic lupus erythematosus, unspecified: Secondary | ICD-10-CM | POA: Insufficient documentation

## 2014-01-04 DIAGNOSIS — G609 Hereditary and idiopathic neuropathy, unspecified: Secondary | ICD-10-CM | POA: Diagnosis not present

## 2014-01-04 MED ORDER — DIPHENHYDRAMINE HCL 25 MG PO CAPS
ORAL_CAPSULE | ORAL | Status: AC
  Filled 2014-01-04: qty 1

## 2014-01-04 MED ORDER — DEXTROSE 5 % IV SOLN
INTRAVENOUS | Status: DC
  Administered 2014-01-04: 10:00:00 via INTRAVENOUS

## 2014-01-04 MED ORDER — ACETAMINOPHEN 325 MG PO TABS
650.0000 mg | ORAL_TABLET | Freq: Once | ORAL | Status: AC
  Administered 2014-01-04: 650 mg via ORAL

## 2014-01-04 MED ORDER — DIPHENHYDRAMINE HCL 25 MG PO TABS
25.0000 mg | ORAL_TABLET | Freq: Once | ORAL | Status: AC
  Administered 2014-01-04: 25 mg via ORAL
  Filled 2014-01-04: qty 1

## 2014-01-04 MED ORDER — ACETAMINOPHEN 325 MG PO TABS
ORAL_TABLET | ORAL | Status: AC
  Filled 2014-01-04: qty 2

## 2014-01-04 MED ORDER — IMMUNE GLOBULIN (HUMAN) 10 GM/100ML IV SOLN
70.0000 g | INTRAVENOUS | Status: DC
  Administered 2014-01-04: 70 g via INTRAVENOUS
  Filled 2014-01-04: qty 700

## 2014-01-13 ENCOUNTER — Encounter: Payer: Self-pay | Admitting: Family Medicine

## 2014-02-08 ENCOUNTER — Encounter (HOSPITAL_COMMUNITY)
Admission: RE | Admit: 2014-02-08 | Discharge: 2014-02-08 | Disposition: A | Discharge status: Home / Self Care | Payer: Medicare Other | Source: Ambulatory Visit | Attending: Internal Medicine | Admitting: Internal Medicine

## 2014-02-08 DIAGNOSIS — G609 Hereditary and idiopathic neuropathy, unspecified: Secondary | ICD-10-CM | POA: Diagnosis not present

## 2014-02-08 DIAGNOSIS — M329 Systemic lupus erythematosus, unspecified: Secondary | ICD-10-CM | POA: Diagnosis present

## 2014-02-08 LAB — CBC WITH DIFFERENTIAL/PLATELET
Basophils Absolute: 0 10*3/uL (ref 0.0–0.1)
Basophils Relative: 1 % (ref 0–1)
Eosinophils Absolute: 0.1 10*3/uL (ref 0.0–0.7)
Eosinophils Relative: 4 % (ref 0–5)
HCT: 39 % (ref 36.0–46.0)
Hemoglobin: 12.6 g/dL (ref 12.0–15.0)
Lymphocytes Relative: 36 % (ref 12–46)
Lymphs Abs: 1.2 10*3/uL (ref 0.7–4.0)
MCH: 28.8 pg (ref 26.0–34.0)
MCHC: 32.3 g/dL (ref 30.0–36.0)
MCV: 89 fL (ref 78.0–100.0)
Monocytes Absolute: 0.4 10*3/uL (ref 0.1–1.0)
Monocytes Relative: 11 % (ref 3–12)
Neutro Abs: 1.6 10*3/uL — ABNORMAL LOW (ref 1.7–7.7)
Neutrophils Relative %: 48 % (ref 43–77)
Platelets: 205 10*3/uL (ref 150–400)
RBC: 4.38 MIL/uL (ref 3.87–5.11)
RDW: 14 % (ref 11.5–15.5)
WBC: 3.3 10*3/uL — ABNORMAL LOW (ref 4.0–10.5)

## 2014-02-08 LAB — CREATININE, SERUM
Creatinine, Ser: 0.79 mg/dL (ref 0.50–1.10)
GFR calc Af Amer: >90 mL/min (ref >90)
GFR calc non Af Amer: >90 mL/min (ref >90)

## 2014-02-08 LAB — ALT: ALT: 27 U/L (ref 0–35)

## 2014-02-08 LAB — AST: AST: 33 U/L (ref 0–37)

## 2014-02-08 MED ORDER — DEXTROSE 5 % IV SOLN
INTRAVENOUS | Status: DC
  Administered 2014-02-08: 11:00:00 via INTRAVENOUS

## 2014-02-08 MED ORDER — DIPHENHYDRAMINE HCL 25 MG PO CAPS
ORAL_CAPSULE | ORAL | Status: AC
  Filled 2014-02-08: qty 1

## 2014-02-08 MED ORDER — DIPHENHYDRAMINE HCL 25 MG PO TABS: 25.0000 mg | ORAL_TABLET | ORAL | Status: DC

## 2014-02-08 MED ORDER — DIPHENHYDRAMINE HCL 25 MG PO CAPS
25.0000 mg | ORAL_CAPSULE | ORAL | Status: DC
  Administered 2014-02-08: 25 mg via ORAL

## 2014-02-08 MED ORDER — IMMUNE GLOBULIN (HUMAN) 10 GM/100ML IV SOLN
70.0000 g | INTRAVENOUS | Status: DC
  Administered 2014-02-08: 70 g via INTRAVENOUS
  Filled 2014-02-08 (×2): qty 700

## 2014-02-08 MED ORDER — ACETAMINOPHEN 325 MG PO TABS
650.0000 mg | ORAL_TABLET | ORAL | Status: DC
  Administered 2014-02-08: 650 mg via ORAL

## 2014-02-08 MED ORDER — ACETAMINOPHEN 325 MG PO TABS
ORAL_TABLET | ORAL | Status: AC
  Filled 2014-02-08: qty 2

## 2014-02-09 LAB — C3 COMPLEMENT: C3 Complement: 142 mg/dL (ref 90–180)

## 2014-02-09 LAB — ANTI-DNA ANTIBODY, DOUBLE-STRANDED: ds DNA Ab: 2 IU/mL

## 2014-02-09 LAB — C4 COMPLEMENT: Complement C4, Body Fluid: 57 mg/dL — ABNORMAL HIGH (ref 10–40)

## 2014-02-28 ENCOUNTER — Ambulatory Visit (AMBULATORY_SURGERY_CENTER): Payer: Self-pay | Admitting: *Deleted

## 2014-02-28 VITALS — Ht 68.0 in | Wt 147.0 lb

## 2014-02-28 DIAGNOSIS — Z1211 Encounter for screening for malignant neoplasm of colon: Secondary | ICD-10-CM

## 2014-02-28 MED ORDER — NA SULFATE-K SULFATE-MG SULF 17.5-3.13-1.6 GM/177ML PO SOLN: ORAL | Status: DC

## 2014-02-28 NOTE — Progress Notes (Signed)
Patient denies any allergies to eggs or soy. Patient denies any problems with sedation. Patient denies any oxygen use at home and does not take any diet/weight loss medications. No e-mail/computer per pt.

## 2014-03-14 ENCOUNTER — Encounter: Payer: Self-pay | Admitting: Gastroenterology

## 2014-03-14 ENCOUNTER — Ambulatory Visit (AMBULATORY_SURGERY_CENTER): Payer: Medicare Other | Admitting: Gastroenterology

## 2014-03-14 VITALS — BP 110/60 | HR 65 | Temp 97.4°F | Resp 15 | Ht 68.0 in | Wt 147.0 lb

## 2014-03-14 DIAGNOSIS — K5730 Diverticulosis of large intestine without perforation or abscess without bleeding: Secondary | ICD-10-CM

## 2014-03-14 DIAGNOSIS — D125 Benign neoplasm of sigmoid colon: Secondary | ICD-10-CM

## 2014-03-14 DIAGNOSIS — Z1211 Encounter for screening for malignant neoplasm of colon: Secondary | ICD-10-CM

## 2014-03-14 DIAGNOSIS — D123 Benign neoplasm of transverse colon: Secondary | ICD-10-CM

## 2014-03-14 MED ORDER — SODIUM CHLORIDE 0.9 % IV SOLN: 500.0000 mL | INTRAVENOUS | Status: DC

## 2014-03-14 NOTE — Progress Notes (Signed)
A/ox3, pleased with MAC, report to RN 

## 2014-03-14 NOTE — Op Note (Signed)
[facility]  [address] [place], [address]   COLONOSCOPY PROCEDURE REPORT  PATIENT: Samantha [name], Samantha [name]  MR#: [id number] BIRTHDATE: [birth year] , 69  yrs. old GENDER: female ENDOSCOPIST: Inda Castle, MD REFERRED TS:[id number] Urie, DO PROCEDURE DATE:  03/14/2014 PROCEDURE:   Colonoscopy with snare polypectomy and Colonoscopy with cold biopsy polypectomy First Screening Colonoscopy - Avg.  risk and is 50 yrs.  old or older Yes.  Prior Negative Screening - Now for repeat screening. N/A  History of Adenoma - Now for follow-up colonoscopy & has been > or = to 3 yrs.  N/A  Polyps Removed Today? Yes. ASA CLASS:   Class II INDICATIONS:average risk patient for colon cancer. MEDICATIONS: Monitored anesthesia care and Propofol 300 mg IV  DESCRIPTION OF PROCEDURE:   After the risks benefits and alternatives of the procedure were thoroughly explained, informed consent was obtained.  The digital rectal exam revealed no abnormalities of the rectum.   The LB [id number]  endoscope was introduced through the anus and advanced to the cecum, which was identified by both the appendix and ileocecal valve. No adverse events experienced.   The quality of the prep was excellent using Suprep  The instrument was then slowly withdrawn as the colon was fully examined.      COLON FINDINGS: A sessile polyp measuring 12 mm in size was found at the splenic flexure.  A polypectomy was performed with a cold snare.  The resection was complete, the polyp tissue was completely retrieved and sent to histology.   There was mild diverticulosis noted in the descending colon.   Three sessile polyps ranging from 2 to 82mm in size were found in the sigmoid colon.  Polypectomies were performed with cold forceps.  Retroflexed views revealed no abnormalities. The time to cecum=6 minutes 19 seconds.  Withdrawal time=8 minutes 50 seconds.  The scope was withdrawn and the procedure  completed. COMPLICATIONS: There were no immediate complications.  ENDOSCOPIC IMPRESSION: 1.   Sessile polyp was found at the splenic flexure; polypectomy was performed with a cold snare 2.   Mild diverticulosis was noted in the descending colon 3.   Three sessile polyps ranging from 2 to 33mm in size were found in the sigmoid colon; polypectomies were performed with cold forceps  RECOMMENDATIONS: If the polyp(s) removed today are proven to be adenomatous (pre-cancerous) polyps, you will need a colonoscopy in 3 years. Otherwise you should continue to follow colorectal cancer screening guidelines for "routine risk" patients with a colonoscopy in 10 years.  You will receive a letter within 1-2 weeks with the results of your biopsy as well as final recommendations.  Please call my office if you have not received a letter after 3 weeks.  eSigned:  Inda Castle, MD 03/14/2014 9:46 AM   cc:   PATIENT NAME:  [name], [name] MR#: [id number]

## 2014-03-14 NOTE — Patient Instructions (Signed)
YOU HAD AN ENDOSCOPIC PROCEDURE TODAY AT THE [facility]: Refer to the procedure report that was given to you for any specific questions about what was found during the examination.  If the procedure report does not answer your questions, please call your gastroenterologist to clarify.  If you requested that your care partner not be given the details of your procedure findings, then the procedure report has been included in a sealed envelope for you to review at your convenience later.  YOU SHOULD EXPECT: Some feelings of bloating in the abdomen. Passage of more gas than usual.  Walking can help get rid of the air that was put into your GI tract during the procedure and reduce the bloating. If you had a lower endoscopy (such as a colonoscopy or flexible sigmoidoscopy) you may notice spotting of blood in your stool or on the toilet paper. If you underwent a bowel prep for your procedure, then you may not have a normal bowel movement for a few days.  DIET: Your first meal following the procedure should be a light meal and then it is ok to progress to your normal diet.  A half-sandwich or bowl of soup is an example of a good first meal.  Heavy or fried foods are harder to digest and may make you feel nauseous or bloated.  Likewise meals heavy in dairy and vegetables can cause extra gas to form and this can also increase the bloating.  Drink plenty of fluids but you should avoid alcoholic beverages for 24 hours.  ACTIVITY: Your care partner should take you home directly after the procedure.  You should plan to take it easy, moving slowly for the rest of the day.  You can resume normal activity the day after the procedure however you should NOT DRIVE or use heavy machinery for 24 hours (because of the sedation medicines used during the test).    SYMPTOMS TO REPORT IMMEDIATELY: A gastroenterologist can be reached at any hour.  During normal business hours, 8:30 AM to 5:00 PM Monday through Friday,  call [phone].  After hours and on weekends, please call the GI answering service at [phone] who will take a message and have the physician on call contact you.   Following lower endoscopy (colonoscopy or flexible sigmoidoscopy):  Excessive amounts of blood in the stool  Significant tenderness or worsening of abdominal pains  Swelling of the abdomen that is new, acute  Fever of 100F or higher  FOLLOW UP: If any biopsies were taken you will be contacted by phone or by letter within the next 1-3 weeks.  Call your gastroenterologist if you have not heard about the biopsies in 3 weeks.  Our staff will call the home number listed on your records the next business day following your procedure to check on you and address any questions or concerns that you may have at that time regarding the information given to you following your procedure. This is a courtesy call and so if there is no answer at the home number and we have not heard from you through the emergency physician on call, we will assume that you have returned to your regular daily activities without incident.  SIGNATURES/CONFIDENTIALITY: You and/or your care partner have signed paperwork which will be entered into your electronic medical record.  These signatures attest to the fact that that the information above on your After Visit Summary has been reviewed and is understood.  Full responsibility of the confidentiality of this   discharge information lies with you and/or your care-partner.  Polyps, diverticulosis,high fiber diet-handout given  Repeat colonoscopy will be determined by pathology.

## 2014-03-14 NOTE — Progress Notes (Signed)
Called to room to assist during endoscopic procedure.  Patient ID and intended procedure confirmed with present staff. Received instructions for my participation in the procedure from the performing physician.  

## 2014-03-15 ENCOUNTER — Telehealth: Payer: Self-pay | Admitting: *Deleted

## 2014-03-15 NOTE — Telephone Encounter (Signed)
  Follow up Call-  Call back number 03/14/2014  Post procedure Call Back phone  # [phone]  Permission to leave phone message Yes     Patient questions:  Do you have a fever, pain , or abdominal swelling? No. Pain Score  0 *  Have you tolerated food without any problems? Yes.    Have you been able to return to your normal activities? Yes.    Do you have any questions about your discharge instructions: Diet   No. Medications  No. Follow up visit  No.  Do you have questions or concerns about your Care? No.  Actions: * If pain score is 4 or above: No action needed, pain <4.

## 2014-03-18 ENCOUNTER — Encounter: Payer: Self-pay | Admitting: Gastroenterology

## 2014-03-21 ENCOUNTER — Other Ambulatory Visit (HOSPITAL_COMMUNITY): Payer: Self-pay | Admitting: *Deleted

## 2014-03-22 ENCOUNTER — Encounter (HOSPITAL_COMMUNITY)
Admission: RE | Admit: 2014-03-22 | Discharge: 2014-03-22 | Disposition: A | Discharge status: Home / Self Care | Payer: Medicare Other | Source: Ambulatory Visit | Attending: Internal Medicine | Admitting: Internal Medicine

## 2014-03-22 DIAGNOSIS — M329 Systemic lupus erythematosus, unspecified: Secondary | ICD-10-CM | POA: Insufficient documentation

## 2014-03-22 DIAGNOSIS — G609 Hereditary and idiopathic neuropathy, unspecified: Secondary | ICD-10-CM | POA: Insufficient documentation

## 2014-03-22 MED ORDER — DIPHENHYDRAMINE HCL 25 MG PO CAPS
ORAL_CAPSULE | ORAL | Status: DC
Start: 2014-03-22 — End: 2014-03-23
  Filled 2014-03-22: qty 1

## 2014-03-22 MED ORDER — IMMUNE GLOBULIN (HUMAN) 10 GM/100ML IV SOLN
70.0000 g | Freq: Once | INTRAVENOUS | Status: AC
  Administered 2014-03-22: 70 g via INTRAVENOUS
  Filled 2014-03-22: qty 700

## 2014-03-22 MED ORDER — DIPHENHYDRAMINE HCL 25 MG PO CAPS
25.0000 mg | ORAL_CAPSULE | ORAL | Status: AC
  Administered 2014-03-22: 25 mg via ORAL

## 2014-03-22 MED ORDER — ACETAMINOPHEN 325 MG PO TABS
650.0000 mg | ORAL_TABLET | ORAL | Status: AC
  Administered 2014-03-22: 650 mg via ORAL

## 2014-03-22 MED ORDER — ACETAMINOPHEN 325 MG PO TABS
ORAL_TABLET | ORAL | Status: AC
  Filled 2014-03-22: qty 1

## 2014-03-22 MED ORDER — DEXTROSE 5 % IV SOLN
INTRAVENOUS | Status: AC
  Administered 2014-03-22: 10:00:00 via INTRAVENOUS

## 2014-04-19 ENCOUNTER — Other Ambulatory Visit (HOSPITAL_COMMUNITY): Payer: Self-pay | Admitting: *Deleted

## 2014-04-19 ENCOUNTER — Encounter (HOSPITAL_COMMUNITY): Payer: Medicare Other

## 2014-04-20 ENCOUNTER — Encounter (HOSPITAL_COMMUNITY)
Admission: RE | Admit: 2014-04-20 | Discharge: 2014-04-20 | Disposition: A | Discharge status: Home / Self Care | Payer: Medicare Other | Source: Ambulatory Visit | Attending: Internal Medicine | Admitting: Internal Medicine

## 2014-04-20 DIAGNOSIS — M329 Systemic lupus erythematosus, unspecified: Secondary | ICD-10-CM | POA: Insufficient documentation

## 2014-04-20 DIAGNOSIS — G609 Hereditary and idiopathic neuropathy, unspecified: Secondary | ICD-10-CM | POA: Diagnosis not present

## 2014-04-20 MED ORDER — ACETAMINOPHEN 325 MG PO TABS
ORAL_TABLET | ORAL | Status: AC
  Filled 2014-04-20: qty 2

## 2014-04-20 MED ORDER — ACETAMINOPHEN 325 MG PO TABS
650.0000 mg | ORAL_TABLET | Freq: Four times a day (QID) | ORAL | Status: DC | PRN
  Administered 2014-04-20: 650 mg via ORAL

## 2014-04-20 MED ORDER — DEXTROSE 5 % IV SOLN
INTRAVENOUS | Status: DC
  Administered 2014-04-20 (×2): via INTRAVENOUS

## 2014-04-20 MED ORDER — DIPHENHYDRAMINE HCL 25 MG PO TABS
25.0000 mg | ORAL_TABLET | Freq: Once | ORAL | Status: AC
  Administered 2014-04-20: 25 mg via ORAL
  Filled 2014-04-20: qty 1

## 2014-04-20 MED ORDER — DIPHENHYDRAMINE HCL 25 MG PO CAPS
ORAL_CAPSULE | ORAL | Status: AC
  Filled 2014-04-20: qty 1

## 2014-04-20 MED ORDER — IMMUNE GLOBULIN (HUMAN) 1 GM/10ML IJ SOLN
70.0000 g | INTRAMUSCULAR | Status: DC
  Administered 2014-04-20: 70 g via INTRAVENOUS
  Filled 2014-04-20: qty 700

## 2014-06-01 ENCOUNTER — Emergency Department (HOSPITAL_COMMUNITY)
Admission: EM | Admit: 2014-06-01 | Discharge: 2014-06-01 | Disposition: A | Discharge status: Home / Self Care | Payer: Medicare Other | Attending: Emergency Medicine | Admitting: Emergency Medicine

## 2014-06-01 ENCOUNTER — Emergency Department (HOSPITAL_COMMUNITY): Payer: Medicare Other

## 2014-06-01 ENCOUNTER — Encounter (HOSPITAL_COMMUNITY): Payer: Self-pay | Admitting: Emergency Medicine

## 2014-06-01 DIAGNOSIS — R079 Chest pain, unspecified: Secondary | ICD-10-CM | POA: Diagnosis present

## 2014-06-01 DIAGNOSIS — I10 Essential (primary) hypertension: Secondary | ICD-10-CM | POA: Insufficient documentation

## 2014-06-01 DIAGNOSIS — J069 Acute upper respiratory infection, unspecified: Secondary | ICD-10-CM | POA: Diagnosis not present

## 2014-06-01 DIAGNOSIS — Z8619 Personal history of other infectious and parasitic diseases: Secondary | ICD-10-CM | POA: Insufficient documentation

## 2014-06-01 DIAGNOSIS — Z720 Tobacco use: Secondary | ICD-10-CM | POA: Insufficient documentation

## 2014-06-01 DIAGNOSIS — Z79899 Other long term (current) drug therapy: Secondary | ICD-10-CM | POA: Diagnosis not present

## 2014-06-01 DIAGNOSIS — Z8719 Personal history of other diseases of the digestive system: Secondary | ICD-10-CM | POA: Insufficient documentation

## 2014-06-01 LAB — BASIC METABOLIC PANEL
Anion gap: 6 (ref 5–15)
BUN: 11 mg/dL (ref 6–20)
CO2: 25 mmol/L (ref 22–32)
Calcium: 8.9 mg/dL (ref 8.9–10.3)
Chloride: 107 mmol/L (ref 101–111)
Creatinine, Ser: 0.59 mg/dL (ref 0.44–1.00)
GFR calc Af Amer: >60 mL/min (ref >60)
GFR calc non Af Amer: >60 mL/min (ref >60)
Glucose, Bld: 99 mg/dL (ref 70–99)
Potassium: 3.5 mmol/L (ref 3.5–5.1)
Sodium: 138 mmol/L (ref 135–145)

## 2014-06-01 LAB — URINE MICROSCOPIC-ADD ON

## 2014-06-01 LAB — URINALYSIS, ROUTINE W REFLEX MICROSCOPIC
Bilirubin Urine: NEGATIVE
Glucose, UA: NEGATIVE mg/dL
Ketones, ur: NEGATIVE mg/dL
Nitrite: NEGATIVE
Protein, ur: NEGATIVE mg/dL
Specific Gravity, Urine: 1.015 (ref 1.005–1.030)
Urobilinogen, UA: 1 mg/dL (ref 0.0–1.0)
pH: 6 (ref 5.0–8.0)

## 2014-06-01 LAB — I-STAT TROPONIN, ED: Troponin i, poc: 0 ng/mL (ref 0.00–0.08)

## 2014-06-01 LAB — CBC
HCT: 40.9 % (ref 36.0–46.0)
Hemoglobin: 12.9 g/dL (ref 12.0–15.0)
MCH: 28.5 pg (ref 26.0–34.0)
MCHC: 31.5 g/dL (ref 30.0–36.0)
MCV: 90.3 fL (ref 78.0–100.0)
Platelets: 168 10*3/uL (ref 150–400)
RBC: 4.53 MIL/uL (ref 3.87–5.11)
RDW: 13.9 % (ref 11.5–15.5)
WBC: 5.1 10*3/uL (ref 4.0–10.5)

## 2014-06-01 MED ORDER — ALBUTEROL SULFATE HFA 108 (90 BASE) MCG/ACT IN AERS
2.0000 | INHALATION_SPRAY | RESPIRATORY_TRACT | Status: AC
  Administered 2014-06-01: 2 via RESPIRATORY_TRACT
  Filled 2014-06-01: qty 6.7

## 2014-06-01 MED ORDER — IPRATROPIUM BROMIDE 0.02 % IN SOLN
0.5000 mg | Freq: Once | RESPIRATORY_TRACT | Status: AC
  Administered 2014-06-01: 0.5 mg via RESPIRATORY_TRACT
  Filled 2014-06-01: qty 2.5

## 2014-06-01 MED ORDER — SODIUM CHLORIDE 0.9 % IV BOLUS (SEPSIS)
500.0000 mL | INTRAVENOUS | Status: AC
  Administered 2014-06-01: 500 mL via INTRAVENOUS

## 2014-06-01 MED ORDER — IBUPROFEN 200 MG PO TABS: 600.0000 mg | ORAL_TABLET | Freq: Once | ORAL | Status: DC

## 2014-06-01 MED ORDER — PHENYLEPHRINE-DM-GG-APAP 5-10-200-325 MG PO TABS: 2.0000 | ORAL_TABLET | Freq: Three times a day (TID) | ORAL | Status: DC

## 2014-06-01 MED ORDER — LEVOFLOXACIN 500 MG PO TABS: 750.0000 mg | ORAL_TABLET | Freq: Every day | ORAL | Status: DC

## 2014-06-01 MED ORDER — ACETAMINOPHEN 325 MG PO TABS
650.0000 mg | ORAL_TABLET | Freq: Once | ORAL | Status: AC
  Administered 2014-06-01: 650 mg via ORAL
  Filled 2014-06-01: qty 2

## 2014-06-01 MED ORDER — ALBUTEROL SULFATE (2.5 MG/3ML) 0.083% IN NEBU
5.0000 mg | INHALATION_SOLUTION | Freq: Once | RESPIRATORY_TRACT | Status: AC
  Administered 2014-06-01: 5 mg via RESPIRATORY_TRACT
  Filled 2014-06-01: qty 6

## 2014-06-01 NOTE — ED Provider Notes (Signed)
CSN: [id number]     Arrival date & time 06/01/14  0047 History   First MD Initiated Contact with Patient 06/01/14 0124     Chief Complaint  Patient presents with  . Chest Pain  . Shortness of Breath   (Consider location/radiation/quality/duration/timing/severity/associated sxs/prior Treatment) HPI   Samantha [name] is a 54 year old female with a history of Guillain-Barr syndrome presenting with cough, nasal congestion and chest pain. She states the symptoms started yesterday. She says she first noticed runny nose, nasal congestion and a nonproductive cough. She states she feels chest pain when she is coughing and some shortness of breath also. She describes the pain as an ache and reports it as a 9 out of 10, stating "it's not too bad".  She reports some chills at home also but denies any muscle aches, nausea, vomiting, diarrhea or urinary symptoms.  Past Medical History  Diagnosis Date  . Unspecified essential hypertension   . Backache, unspecified   . Altered mental status   . Gastric ulcer, unspecified as acute or chronic, without mention of hemorrhage, perforation, or obstruction   . Helicobacter pylori (H. pylori)   . Tobacco abuse    Past Surgical History  Procedure Laterality Date  . Upper gastrointestinal endoscopy  2013   Family History  Problem Relation Age of Onset  . Diabetes Sister   . Hypertension Mother   . Colon cancer Neg Hx    History  Substance Use Topics  . Smoking status: Current Every Day Smoker -- 0.01 packs/day for 35 years    Types: Cigarettes  . Smokeless tobacco: Never Used     Comment: smokes one cigarette daily per pt.  . Alcohol Use: Yes     Comment: occassional; beer    OB History    No data available     Review of Systems  Constitutional: Positive for chills. Negative for fever.  HENT: Positive for congestion and rhinorrhea. Negative for sore throat.   Eyes: Negative for visual disturbance.  Respiratory: Positive for cough, chest  tightness and shortness of breath.   Cardiovascular: Negative for chest pain and leg swelling.  Gastrointestinal: Negative for nausea, vomiting and diarrhea.  Genitourinary: Negative for dysuria.  Musculoskeletal: Negative for myalgias.  Skin: Negative for rash.  Neurological: Negative for weakness, numbness and headaches.      Allergies  Tramadol  Home Medications   Prior to Admission medications   Medication Sig Start Date End Date Taking? Authorizing Provider  cyclobenzaprine (FLEXERIL) 10 MG tablet Take 1 tablet (10 mg total) by mouth 3 (three) times daily as needed. 08/28/11  Yes Rosalita Chessman, DO  hydroxychloroquine (PLAQUENIL) 200 MG tablet Take 200 mg by mouth 2 (two) times daily.   Yes Historical Provider, MD  Immune Globulin 10% (PRIVIGEN) 10 GM/100ML SOLN Inject 1 g/kg into the vein once.   Yes Historical Provider, MD  Multiple Vitamin (MULTIVITAMIN) tablet Take 1 tablet by mouth daily.   Yes Historical Provider, MD  mycophenolate (CELLCEPT) 500 MG tablet Take 1,500 mg by mouth 2 (two) times daily.    Yes Historical Provider, MD  Na Sulfate-K Sulfate-Mg Sulf SOLN (no substitutions)-TAKE AS DIRECTED. Patient taking differently: Take 1 Bottle by mouth once. (no substitutions)-TAKE AS DIRECTED. 02/28/14  Yes Inda Castle, MD  nicotine (NICOTROL) 10 MG inhaler Inhale 1 puff into the lungs as needed for smoking cessation. 11/22/11  Yes Rosalita Chessman, DO  oxyCODONE-acetaminophen (PERCOCET) 10-325 MG per tablet Take 1 tablet by mouth every 8 (  eight) hours as needed. 12/20/13  Yes Rosalita Chessman, DO  NONFORMULARY OR COMPOUNDED ITEM Cane As directed 08/22/11   Rosalita Chessman, DO   BP 169/138 mmHg  Pulse 95  Temp(Src) 97.4 F (36.3 C) (Oral)  SpO2 95%  LMP 04/11/2011 Physical Exam  Constitutional: She is oriented to person, place, and time. She appears well-developed and well-nourished. No distress.  HENT:  Head: Normocephalic and atraumatic.  Mouth/Throat: Oropharynx is  clear and moist. No oropharyngeal exudate.  Eyes: Conjunctivae are normal.  Neck: Neck supple. No thyromegaly present.  Cardiovascular: Normal rate, regular rhythm and intact distal pulses.  Exam reveals no gallop and no friction rub.   No murmur heard. Pulmonary/Chest: Effort normal. No respiratory distress. She has no decreased breath sounds. She has wheezes. She has no rhonchi. She has no rales. She exhibits no tenderness.  Abdominal: Soft. She exhibits no distension and no mass. There is no tenderness. There is no rebound and no guarding.  Musculoskeletal: She exhibits no tenderness.  Lymphadenopathy:    She has no cervical adenopathy.  Neurological: She is alert and oriented to person, place, and time.  Skin: Skin is warm and dry. No rash noted. She is not diaphoretic.  Psychiatric: She has a normal mood and affect.  Nursing note and vitals reviewed.   ED Course  Procedures (including critical care time) Labs Review Labs Reviewed  [facility], ED    Imaging Review Dg Chest 2 View  06/01/2014   CLINICAL DATA:  Dyspnea for 24 hours, now with right anterior chest pain.  EXAM: CHEST  2 VIEW  COMPARISON:  11/22/2011  FINDINGS: The heart size and mediastinal contours are within normal limits. Both lungs are clear. The visualized skeletal structures are unremarkable.  IMPRESSION: No active cardiopulmonary disease.   Electronically Signed   By: Andreas Newport M.D.   On: 06/01/2014 01:41     EKG Interpretation None      MDM   Final diagnoses:  URI (upper respiratory infection)   54 yo with symptoms are consistent with URI.  She is febrile on my exam to 101.3, but her WBC is normal. Her CXR is negative for acute infiltrate. Discussed case with Dr. Lita Mains. Due to pt's history of Jossie Ng, will treat with prescription of Levaquin. Pt's symptoms improved after treatment in the ED.   Pt will be discharged with symptomatic treatment. Discussed  with pt and her daughter who verbalize understanding and are agreeable with plan. She is well-appearing, in no acute distress and vital signs reviewed and not concerning. She appears safe to be discharged.  Discharge include follow-up with their PCP.  Return precautions provided. Pt aware of plan and in agreement.     Filed Vitals:   06/01/14 0056 06/01/14 0206 06/01/14 0219 06/01/14 0521  BP: 169/138 119/74  132/70  Pulse: 95 94  84  Temp: 97.4 F (36.3 C) 101.3 F (38.5 C)  98.9 F (37.2 C)  TempSrc: Oral Oral  Oral  Resp:  26  18  SpO2: 95% 95% 95% 97%   Meds given in ED:  Medications  albuterol (PROVENTIL) (2.5 MG/3ML) 0.083% nebulizer solution 5 mg (5 mg Nebulization Given 06/01/14 0218)  ipratropium (ATROVENT) nebulizer solution 0.5 mg (0.5 mg Nebulization Given 06/01/14 0218)  acetaminophen (TYLENOL) tablet 650 mg (650 mg Oral Given 06/01/14 0256)  sodium chloride 0.9 % bolus 500 mL (0 mLs Intravenous Stopped 06/01/14 0538)  albuterol (PROVENTIL HFA;VENTOLIN HFA) 108 (  90 BASE) MCG/ACT inhaler 2 puff (2 puffs Inhalation Given 06/01/14 0537)    Discharge Medication List as of 06/01/2014  4:55 AM    START taking these medications   Details  levofloxacin (LEVAQUIN) 500 MG tablet Take 1.5 tablets (750 mg total) by mouth daily., Starting 06/01/2014, Until Discontinued, Print    Phenylephrine-DM-GG-APAP (MUCINEX FAST-MAX CONGEST COLD) 5-10-200-325 MG TABS Take 2 tablets by mouth 3 (three) times daily., Starting 06/01/2014, Until Discontinued, Print          Britt Bottom, NP 06/01/14 1807  Julianne Rice, MD 06/09/14 [id number]

## 2014-06-01 NOTE — Discharge Instructions (Signed)
Please follow the directions provided.  Be sure to follow-up with your primary care provider to ensure you are getting better.  Take the Levaquin as directed until it is all gone.  Take the multi-symptom cold medicine three times a day to help with symptoms.  Use the albuterol inhaler 2 puffs every 4 hours to help with cough.  Don't hesitate to return for any new, worsening or concerning symptoms.     SEEK IMMEDIATE MEDICAL CARE IF:  You have a fever.  You develop severe or persistent headache, ear pain, sinus pain, or chest pain.  You develop wheezing, a prolonged cough, cough up blood, or have a change in your usual mucus (if you have chronic lung disease).  You develop sore muscles or a stiff neck.

## 2014-06-01 NOTE — ED Notes (Signed)
Pt daughter reports pt has hx Guilliuam B  in which pt has frequent episodes of chest pain. Pt began having chest pain earlier today that radiates down to right lower abdomen. Pt rates pain 9/10.

## 2014-06-20 ENCOUNTER — Other Ambulatory Visit (HOSPITAL_COMMUNITY): Payer: Self-pay | Admitting: *Deleted

## 2014-06-21 ENCOUNTER — Encounter (HOSPITAL_COMMUNITY)
Admission: RE | Admit: 2014-06-21 | Discharge: 2014-06-21 | Disposition: A | Discharge status: Home / Self Care | Payer: Medicare Other | Source: Ambulatory Visit | Attending: Internal Medicine | Admitting: Internal Medicine

## 2014-06-21 DIAGNOSIS — G609 Hereditary and idiopathic neuropathy, unspecified: Secondary | ICD-10-CM | POA: Insufficient documentation

## 2014-06-21 DIAGNOSIS — M329 Systemic lupus erythematosus, unspecified: Secondary | ICD-10-CM | POA: Diagnosis present

## 2014-06-21 MED ORDER — IMMUNE GLOBULIN (HUMAN) 10 GM/100ML IJ SOLN
70.0000 g | Freq: Once | INTRAMUSCULAR | Status: AC
  Administered 2014-06-21: 70 g via INTRAVENOUS
  Filled 2014-06-21 (×2): qty 700

## 2014-06-21 MED ORDER — ACETAMINOPHEN 325 MG PO TABS
ORAL_TABLET | ORAL | Status: AC
  Filled 2014-06-21: qty 2

## 2014-06-21 MED ORDER — DIPHENHYDRAMINE HCL 25 MG PO CAPS
ORAL_CAPSULE | ORAL | Status: AC
  Filled 2014-06-21: qty 1

## 2014-06-21 MED ORDER — IMMUNE GLOBULIN (HUMAN) 1 GM/10ML IJ SOLN: 70.0000 g | INTRAMUSCULAR | Status: DC

## 2014-06-21 MED ORDER — ACETAMINOPHEN 325 MG PO TABS
650.0000 mg | ORAL_TABLET | Freq: Four times a day (QID) | ORAL | Status: DC | PRN
  Administered 2014-06-21: 650 mg via ORAL

## 2014-06-21 MED ORDER — IMMUNE GLOBULIN (HUMAN) 20 GM/200ML IV SOLN
70.0000 g | Freq: Once | INTRAVENOUS | Status: DC
  Filled 2014-06-21: qty 100

## 2014-06-21 MED ORDER — DIPHENHYDRAMINE HCL 25 MG PO CAPS
25.0000 mg | ORAL_CAPSULE | Freq: Once | ORAL | Status: DC
Start: 2014-06-21 — End: 2014-06-21
  Administered 2014-06-21: 25 mg via ORAL

## 2014-06-21 MED ORDER — DEXTROSE 5 % IV SOLN
INTRAVENOUS | Status: DC
  Administered 2014-06-21 (×2): via INTRAVENOUS

## 2014-07-04 ENCOUNTER — Telehealth: Payer: Self-pay | Admitting: Family Medicine

## 2014-07-04 NOTE — Telephone Encounter (Signed)
Pre Visit letter sent  °

## 2014-07-25 ENCOUNTER — Encounter: Payer: Self-pay | Admitting: Family Medicine

## 2014-07-25 ENCOUNTER — Telehealth: Payer: Self-pay

## 2014-07-25 ENCOUNTER — Ambulatory Visit (INDEPENDENT_AMBULATORY_CARE_PROVIDER_SITE_OTHER): Payer: Medicare Other | Admitting: Family Medicine

## 2014-07-25 VITALS — BP 100/62 | HR 68 | Temp 98.2°F | Resp 16 | Ht 68.0 in | Wt 148.0 lb

## 2014-07-25 DIAGNOSIS — Z1239 Encounter for other screening for malignant neoplasm of breast: Secondary | ICD-10-CM

## 2014-07-25 DIAGNOSIS — M62838 Other muscle spasm: Secondary | ICD-10-CM

## 2014-07-25 DIAGNOSIS — M329 Systemic lupus erythematosus, unspecified: Secondary | ICD-10-CM

## 2014-07-25 DIAGNOSIS — E2839 Other primary ovarian failure: Secondary | ICD-10-CM

## 2014-07-25 DIAGNOSIS — Z0000 Encounter for general adult medical examination without abnormal findings: Secondary | ICD-10-CM | POA: Diagnosis not present

## 2014-07-25 LAB — LIPID PANEL
Cholesterol: 214 mg/dL — ABNORMAL HIGH (ref 0–200)
HDL: 60.4 mg/dL (ref >39.00)
LDL Cholesterol: 139 mg/dL — ABNORMAL HIGH (ref 0–99)
NonHDL: 153.6
Total CHOL/HDL Ratio: 4
Triglycerides: 74 mg/dL (ref 0.0–149.0)
VLDL: 14.8 mg/dL (ref 0.0–40.0)

## 2014-07-25 LAB — CBC WITH DIFFERENTIAL/PLATELET
Basophils Absolute: 0 10*3/uL (ref 0.0–0.1)
Basophils Relative: 0.8 % (ref 0.0–3.0)
Eosinophils Absolute: 0.1 10*3/uL (ref 0.0–0.7)
Eosinophils Relative: 3.9 % (ref 0.0–5.0)
HCT: 39.7 % (ref 36.0–46.0)
Hemoglobin: 13.1 g/dL (ref 12.0–15.0)
Lymphocytes Relative: 42.3 % (ref 12.0–46.0)
Lymphs Abs: 1.6 10*3/uL (ref 0.7–4.0)
MCHC: 33 g/dL (ref 30.0–36.0)
MCV: 86.3 fl (ref 78.0–100.0)
Monocytes Absolute: 0.3 10*3/uL (ref 0.1–1.0)
Monocytes Relative: 8.8 % (ref 3.0–12.0)
Neutro Abs: 1.6 10*3/uL (ref 1.4–7.7)
Neutrophils Relative %: 44.2 % (ref 43.0–77.0)
Platelets: 189 10*3/uL (ref 150.0–400.0)
RBC: 4.6 Mil/uL (ref 3.87–5.11)
RDW: 13.6 % (ref 11.5–15.5)
WBC: 3.7 10*3/uL — ABNORMAL LOW (ref 4.0–10.5)

## 2014-07-25 LAB — BASIC METABOLIC PANEL
BUN: 13 mg/dL (ref 6–23)
CO2: 27 mEq/L (ref 19–32)
Calcium: 9 mg/dL (ref 8.4–10.5)
Chloride: 108 mEq/L (ref 96–112)
Creatinine, Ser: 0.66 mg/dL (ref 0.40–1.20)
GFR: 120.44 mL/min (ref >60.00)
Glucose, Bld: 76 mg/dL (ref 70–99)
Potassium: 3.8 mEq/L (ref 3.5–5.1)
Sodium: 139 mEq/L (ref 135–145)

## 2014-07-25 LAB — POCT URINALYSIS DIPSTICK
Bilirubin, UA: NEGATIVE
Blood, UA: NEGATIVE
Glucose, UA: NEGATIVE
Ketones, UA: NEGATIVE
Leukocytes, UA: NEGATIVE
Nitrite, UA: NEGATIVE
Protein, UA: NEGATIVE
Spec Grav, UA: >=1.03
Urobilinogen, UA: 4
pH, UA: 6

## 2014-07-25 LAB — HEPATIC FUNCTION PANEL
ALT: 26 U/L (ref 0–35)
AST: 28 U/L (ref 0–37)
Albumin: 3.8 g/dL (ref 3.5–5.2)
Alkaline Phosphatase: 55 U/L (ref 39–117)
Bilirubin, Direct: 0 mg/dL (ref 0.0–0.3)
Total Bilirubin: 0.3 mg/dL (ref 0.2–1.2)
Total Protein: 7.1 g/dL (ref 6.0–8.3)

## 2014-07-25 MED ORDER — CYCLOBENZAPRINE HCL 10 MG PO TABS: 10.0000 mg | ORAL_TABLET | Freq: Three times a day (TID) | ORAL | Status: DC | PRN

## 2014-07-25 NOTE — Progress Notes (Signed)
Subjective:   Samantha [name] is a 54 y.o. female who presents for Medicare Annual (Subsequent) preventive examination.  Review of Systems:   Review of Systems  Constitutional: Negative for activity change, appetite change and fatigue.  HENT: Negative for hearing loss, congestion, tinnitus and ear discharge.   Eyes: Negative for visual disturbance (see optho q1y -- vision corrected to 20/20 with glasses). --gould  Respiratory: Negative for cough, chest tightness and shortness of breath.   Cardiovascular: Negative for chest pain, palpitations and leg swelling.  Gastrointestinal: Negative for abdominal pain, diarrhea, constipation and abdominal distention.  Genitourinary: Negative for urgency, frequency, decreased urine volume and difficulty urinating.  Musculoskeletal: Negative for back pain, arthralgias and gait problem.  Skin: Negative for color change, pallor and rash.  Neurological: Negative for dizziness, light-headedness, numbness and headaches.  Hematological: Negative for adenopathy. Does not bruise/bleed easily.  Psychiatric/Behavioral: Negative for suicidal ideas, confusion, sleep disturbance, self-injury, dysphoric mood, decreased concentration and agitation.  Pt is able to read and write and can do all ADLs No risk for falling No abuse/ violence in home           Objective:     Vitals: BP 100/62 mmHg  Pulse 68  Temp(Src) 98.2 F (36.8 C) (Oral)  Resp 16  Ht 5\' 8"  (1.727 m)  Wt 148 lb (67.132 kg)  BMI 22.51 kg/m2  SpO2 98%  LMP 04/11/2011 BP 100/62 mmHg  Pulse 68  Temp(Src) 98.2 F (36.8 C) (Oral)  Resp 16  Ht 5\' 8"  (1.727 m)  Wt 148 lb (67.132 kg)  BMI 22.51 kg/m2  SpO2 98%  LMP 04/11/2011 General appearance: alert, cooperative, appears stated age and no distress Head: Normocephalic, without obvious abnormality, atraumatic Eyes: conjunctivae/corneas clear. PERRL, EOM's intact. Fundi benign. Ears: normal TM's and external ear canals both  ears Nose: Nares normal. Septum midline. Mucosa normal. No drainage or sinus tenderness. Throat: lips, mucosa, and tongue normal; teeth and gums normal Neck: no adenopathy, no carotid bruit, no JVD, supple, symmetrical, trachea midline and thyroid not enlarged, symmetric, no tenderness/mass/nodules Back: symmetric, no curvature. ROM normal. No CVA tenderness. Lungs: clear to auscultation bilaterally Breasts: normal appearance, no masses or tenderness Heart: regular rate and rhythm, S1, S2 normal, no murmur, click, rub or gallop Abdomen: soft, non-tender; bowel sounds normal; no masses,  no organomegaly Extremities: extremities normal, atraumatic, no cyanosis or edema Pulses: 2+ and symmetric Skin: Skin color, texture, turgor normal. No rashes or lesions Lymph nodes: Cervical, supraclavicular, and axillary nodes normal. Neurologic: Motor: still with some weakness in L low ext --pt walking without assistive device Psych- no depression, no anxiety   Tobacco History  Smoking status  . Current Every Day Smoker -- 0.01 packs/day for 35 years  . Types: Cigarettes  Smokeless tobacco  . Never Used    Comment: smokes one cigarette daily per pt.     Ready to quit: Not Answered Counseling given: Not Answered   Past Medical History  Diagnosis Date  . Unspecified essential hypertension   . Backache, unspecified   . Altered mental status   . Gastric ulcer, unspecified as acute or chronic, without mention of hemorrhage, perforation, or obstruction   . Helicobacter pylori (H. pylori)   . Tobacco abuse    Past Surgical History  Procedure Laterality Date  . Upper gastrointestinal endoscopy  2013   Family History  Problem Relation Age of Onset  . Diabetes Sister   . Hypertension Mother   . Colon cancer Neg  Hx    History  Sexual Activity  . Sexual Activity:  . Partners: Male    Outpatient Encounter Prescriptions as of 07/25/2014  Medication Sig  . cyclobenzaprine (FLEXERIL) 10 MG  tablet Take 1 tablet (10 mg total) by mouth 3 (three) times daily as needed.  . hydroxychloroquine (PLAQUENIL) 200 MG tablet Take 200 mg by mouth 2 (two) times daily.  . Immune Globulin 10% (PRIVIGEN) 10 GM/100ML SOLN Inject 1 g/kg into the vein once.  . Multiple Vitamin (MULTIVITAMIN) tablet Take 1 tablet by mouth daily.  . mycophenolate (CELLCEPT) 500 MG tablet Take 1,500 mg by mouth 2 (two) times daily.   . nicotine (NICOTROL) 10 MG inhaler Inhale 1 puff into the lungs as needed for smoking cessation.  . NONFORMULARY OR COMPOUNDED ITEM Cane As directed  . oxyCODONE-acetaminophen (PERCOCET) 10-325 MG per tablet Take 1 tablet by mouth every 8 (eight) hours as needed.  . [DISCONTINUED] cyclobenzaprine (FLEXERIL) 10 MG tablet Take 1 tablet (10 mg total) by mouth 3 (three) times daily as needed.  . [DISCONTINUED] levofloxacin (LEVAQUIN) 500 MG tablet Take 1.5 tablets (750 mg total) by mouth daily.  . [DISCONTINUED] Phenylephrine-DM-GG-APAP (MUCINEX FAST-MAX CONGEST COLD) 5-10-200-325 MG TABS Take 2 tablets by mouth 3 (three) times daily.  . [DISCONTINUED] Na Sulfate-K Sulfate-Mg Sulf SOLN (no substitutions)-TAKE AS DIRECTED. (Patient taking differently: Take 1 Bottle by mouth once. (no substitutions)-TAKE AS DIRECTED.)   No facility-administered encounter medications on file as of 07/25/2014.    Activities of Daily Living In your present state of health, do you have any difficulty performing the following activities: 04/20/2014 11/11/2013  Hearing? N N  Vision? N N  Difficulty concentrating or making decisions? N N  Walking or climbing stairs? N N  Dressing or bathing? N N    Patient Care Team: Rosalita Chessman, DO as PCP - General (Family Medicine) Candee Furbish, MD as Referring Physician (Rheumatology) Inda Castle, MD as Consulting Physician (Gastroenterology) Melissa Noon, OD as Referring Physician (Optometry)    Assessment:    ghm utd See AVS Exercise Activities and Dietary  recommendations-- running after grandchildren    Goals    . Quit smoking / using tobacco      Fall Risk Fall Risk  07/25/2014  Falls in the past year? No   Depression Screen PHQ 2/9 Scores 07/25/2014 09/15/2012  PHQ - 2 Score 0 0     Cognitive Testing No flowsheet data found.  Immunization History  Administered Date(s) Administered  . Tdap 09/15/2012   Screening Tests Health Maintenance  Topic Date Due  . MAMMOGRAM  08/26/2015  . PAP SMEAR  09/16/2015  . COLONOSCOPY  03/14/2017  . TETANUS/TDAP  09/16/2022  . HIV Screening  Completed      Plan:    see AVS During the course of the visit the patient was educated and counseled about the following appropriate screening and preventive services:   Vaccines to include Pneumoccal, Influenza, Hepatitis B, Td, Zostavax, HCV  Electrocardiogram  Cardiovascular Disease  Colorectal cancer screening  Bone density screening  Diabetes screening  Glaucoma screening  Mammography/PAP  Nutrition counseling   Patient Instructions (the written plan) was given to the patient.  1. Lupus (systemic lupus erythematosus) Per rheum - Basic metabolic panel - CBC with Differential/Platelet - Hepatic function panel - Lipid panel - POCT urinalysis dipstick  2. Muscle spasm   - cyclobenzaprine (FLEXERIL) 10 MG tablet; Take 1 tablet (10 mg total) by mouth 3 (three) times daily as needed.  Dispense: 30 tablet; Refill: 2  3. Estrogen deficiency   - DG Bone Density; Future  4. Breast cancer screening   - MM DIGITAL SCREENING BILATERAL; Future  Garnet Koyanagi, DO  07/25/2014

## 2014-07-25 NOTE — Patient Instructions (Signed)
Preventive Care for Adults A healthy lifestyle and preventive care can promote health and wellness. Preventive health guidelines for women include the following key practices.  A routine yearly physical is a good way to check with your health care provider about your health and preventive screening. It is a chance to share any concerns and updates on your health and to receive a thorough exam.  Visit your dentist for a routine exam and preventive care every 6 months. Brush your teeth twice a day and floss once a day. Good oral hygiene prevents tooth decay and gum disease.  The frequency of eye exams is based on your age, health, family medical history, use of contact lenses, and other factors. Follow your health care provider's recommendations for frequency of eye exams.  Eat a healthy diet. Foods like vegetables, fruits, whole grains, low-fat dairy products, and lean protein foods contain the nutrients you need without too many calories. Decrease your intake of foods high in solid fats, added sugars, and salt. Eat the right amount of calories for you.Get information about a proper diet from your health care provider, if necessary.  Regular physical exercise is one of the most important things you can do for your health. Most adults should get at least 150 minutes of moderate-intensity exercise (any activity that increases your heart rate and causes you to sweat) each week. In addition, most adults need muscle-strengthening exercises on 2 or more days a week.  Maintain a healthy weight. The body mass index (BMI) is a screening tool to identify possible weight problems. It provides an estimate of body fat based on height and weight. Your health care provider can find your BMI and can help you achieve or maintain a healthy weight.For adults 20 years and older:  A BMI below 18.5 is considered underweight.  A BMI of 18.5 to 24.9 is normal.  A BMI of 25 to 29.9 is considered overweight.  A BMI of  30 and above is considered obese.  Maintain normal blood lipids and cholesterol levels by exercising and minimizing your intake of saturated fat. Eat a balanced diet with plenty of fruit and vegetables. Blood tests for lipids and cholesterol should begin at age 76 and be repeated every 5 years. If your lipid or cholesterol levels are high, you are over 50, or you are at high risk for heart disease, you may need your cholesterol levels checked more frequently.Ongoing high lipid and cholesterol levels should be treated with medicines if diet and exercise are not working.  If you smoke, find out from your health care provider how to quit. If you do not use tobacco, do not start.  Lung cancer screening is recommended for adults aged 22-80 years who are at high risk for developing lung cancer because of a history of smoking. A yearly low-dose CT scan of the lungs is recommended for people who have at least a 30-pack-year history of smoking and are a current smoker or have quit within the past 15 years. A pack year of smoking is smoking an average of 1 pack of cigarettes a day for 1 year (for example: 1 pack a day for 30 years or 2 packs a day for 15 years). Yearly screening should continue until the smoker has stopped smoking for at least 15 years. Yearly screening should be stopped for people who develop a health problem that would prevent them from having lung cancer treatment.  If you are pregnant, do not drink alcohol. If you are breastfeeding,  be very cautious about drinking alcohol. If you are not pregnant and choose to drink alcohol, do not have more than 1 drink per day. One drink is considered to be 12 ounces (355 mL) of beer, 5 ounces (148 mL) of wine, or 1.5 ounces (44 mL) of liquor.  Avoid use of street drugs. Do not share needles with anyone. Ask for help if you need support or instructions about stopping the use of drugs.  High blood pressure causes heart disease and increases the risk of  stroke. Your blood pressure should be checked at least every 1 to 2 years. Ongoing high blood pressure should be treated with medicines if weight loss and exercise do not work.  If you are 75-52 years old, ask your health care provider if you should take aspirin to prevent strokes.  Diabetes screening involves taking a blood sample to check your fasting blood sugar level. This should be done once every 3 years, after age 15, if you are within normal weight and without risk factors for diabetes. Testing should be considered at a younger age or be carried out more frequently if you are overweight and have at least 1 risk factor for diabetes.  Breast cancer screening is essential preventive care for women. You should practice "breast self-awareness." This means understanding the normal appearance and feel of your breasts and may include breast self-examination. Any changes detected, no matter how small, should be reported to a health care provider. Women in their 58s and 30s should have a clinical breast exam (CBE) by a health care provider as part of a regular health exam every 1 to 3 years. After age 16, women should have a CBE every year. Starting at age 53, women should consider having a mammogram (breast X-ray test) every year. Women who have a family history of breast cancer should talk to their health care provider about genetic screening. Women at a high risk of breast cancer should talk to their health care providers about having an MRI and a mammogram every year.  Breast cancer gene (BRCA)-related cancer risk assessment is recommended for women who have family members with BRCA-related cancers. BRCA-related cancers include breast, ovarian, tubal, and peritoneal cancers. Having family members with these cancers may be associated with an increased risk for harmful changes (mutations) in the breast cancer genes BRCA1 and BRCA2. Results of the assessment will determine the need for genetic counseling and  BRCA1 and BRCA2 testing.  Routine pelvic exams to screen for cancer are no longer recommended for nonpregnant women who are considered low risk for cancer of the pelvic organs (ovaries, uterus, and vagina) and who do not have symptoms. Ask your health care provider if a screening pelvic exam is right for you.  If you have had past treatment for cervical cancer or a condition that could lead to cancer, you need Pap tests and screening for cancer for at least 20 years after your treatment. If Pap tests have been discontinued, your risk factors (such as having a new sexual partner) need to be reassessed to determine if screening should be resumed. Some women have medical problems that increase the chance of getting cervical cancer. In these cases, your health care provider may recommend more frequent screening and Pap tests.  The HPV test is an additional test that may be used for cervical cancer screening. The HPV test looks for the virus that can cause the cell changes on the cervix. The cells collected during the Pap test can be  tested for HPV. The HPV test could be used to screen women aged 30 years and older, and should be used in women of any age who have unclear Pap test results. After the age of 30, women should have HPV testing at the same frequency as a Pap test.  Colorectal cancer can be detected and often prevented. Most routine colorectal cancer screening begins at the age of 50 years and continues through age 75 years. However, your health care provider may recommend screening at an earlier age if you have risk factors for colon cancer. On a yearly basis, your health care provider may provide home test kits to check for hidden blood in the stool. Use of a small camera at the end of a tube, to directly examine the colon (sigmoidoscopy or colonoscopy), can detect the earliest forms of colorectal cancer. Talk to your health care provider about this at age 50, when routine screening begins. Direct  exam of the colon should be repeated every 5-10 years through age 75 years, unless early forms of pre-cancerous polyps or small growths are found.  People who are at an increased risk for hepatitis B should be screened for this virus. You are considered at high risk for hepatitis B if:  You were born in a country where hepatitis B occurs often. Talk with your health care provider about which countries are considered high risk.  Your parents were born in a high-risk country and you have not received a shot to protect against hepatitis B (hepatitis B vaccine).  You have HIV or AIDS.  You use needles to inject street drugs.  You live with, or have sex with, someone who has hepatitis B.  You get hemodialysis treatment.  You take certain medicines for conditions like cancer, organ transplantation, and autoimmune conditions.  Hepatitis C blood testing is recommended for all people born from 1945 through 1965 and any individual with known risks for hepatitis C.  Practice safe sex. Use condoms and avoid high-risk sexual practices to reduce the spread of sexually transmitted infections (STIs). STIs include gonorrhea, chlamydia, syphilis, trichomonas, herpes, HPV, and human immunodeficiency virus (HIV). Herpes, HIV, and HPV are viral illnesses that have no cure. They can result in disability, cancer, and death.  You should be screened for sexually transmitted illnesses (STIs) including gonorrhea and chlamydia if:  You are sexually active and are younger than 24 years.  You are older than 24 years and your health care provider tells you that you are at risk for this type of infection.  Your sexual activity has changed since you were last screened and you are at an increased risk for chlamydia or gonorrhea. Ask your health care provider if you are at risk.  If you are at risk of being infected with HIV, it is recommended that you take a prescription medicine daily to prevent HIV infection. This is  called preexposure prophylaxis (PrEP). You are considered at risk if:  You are a heterosexual woman, are sexually active, and are at increased risk for HIV infection.  You take drugs by injection.  You are sexually active with a partner who has HIV.  Talk with your health care provider about whether you are at high risk of being infected with HIV. If you choose to begin PrEP, you should first be tested for HIV. You should then be tested every 3 months for as long as you are taking PrEP.  Osteoporosis is a disease in which the bones lose minerals and strength   with aging. This can result in serious bone fractures or breaks. The risk of osteoporosis can be identified using a bone density scan. Women ages 65 years and over and women at risk for fractures or osteoporosis should discuss screening with their health care providers. Ask your health care provider whether you should take a calcium supplement or vitamin D to reduce the rate of osteoporosis.  Menopause can be associated with physical symptoms and risks. Hormone replacement therapy is available to decrease symptoms and risks. You should talk to your health care provider about whether hormone replacement therapy is right for you.  Use sunscreen. Apply sunscreen liberally and repeatedly throughout the day. You should seek shade when your shadow is shorter than you. Protect yourself by wearing long sleeves, pants, a wide-brimmed hat, and sunglasses year round, whenever you are outdoors.  Once a month, do a whole body skin exam, using a mirror to look at the skin on your back. Tell your health care provider of new moles, moles that have irregular borders, moles that are larger than a pencil eraser, or moles that have changed in shape or color.  Stay current with required vaccines (immunizations).  Influenza vaccine. All adults should be immunized every year.  Tetanus, diphtheria, and acellular pertussis (Td, Tdap) vaccine. Pregnant women should  receive 1 dose of Tdap vaccine during each pregnancy. The dose should be obtained regardless of the length of time since the last dose. Immunization is preferred during the 27th-36th week of gestation. An adult who has not previously received Tdap or who does not know her vaccine status should receive 1 dose of Tdap. This initial dose should be followed by tetanus and diphtheria toxoids (Td) booster doses every 10 years. Adults with an unknown or incomplete history of completing a 3-dose immunization series with Td-containing vaccines should begin or complete a primary immunization series including a Tdap dose. Adults should receive a Td booster every 10 years.  Varicella vaccine. An adult without evidence of immunity to varicella should receive 2 doses or a second dose if she has previously received 1 dose. Pregnant females who do not have evidence of immunity should receive the first dose after pregnancy. This first dose should be obtained before leaving the health care facility. The second dose should be obtained 4-8 weeks after the first dose.  Human papillomavirus (HPV) vaccine. Females aged 13-26 years who have not received the vaccine previously should obtain the 3-dose series. The vaccine is not recommended for use in pregnant females. However, pregnancy testing is not needed before receiving a dose. If a female is found to be pregnant after receiving a dose, no treatment is needed. In that case, the remaining doses should be delayed until after the pregnancy. Immunization is recommended for any person with an immunocompromised condition through the age of 26 years if she did not get any or all doses earlier. During the 3-dose series, the second dose should be obtained 4-8 weeks after the first dose. The third dose should be obtained 24 weeks after the first dose and 16 weeks after the second dose.  Zoster vaccine. One dose is recommended for adults aged 60 years or older unless certain conditions are  present.  Measles, mumps, and rubella (MMR) vaccine. Adults born before 1957 generally are considered immune to measles and mumps. Adults born in 1957 or later should have 1 or more doses of MMR vaccine unless there is a contraindication to the vaccine or there is laboratory evidence of immunity to   each of the three diseases. A routine second dose of MMR vaccine should be obtained at least 28 days after the first dose for students attending postsecondary schools, health care workers, or international travelers. People who received inactivated measles vaccine or an unknown type of measles vaccine during 1963-1967 should receive 2 doses of MMR vaccine. People who received inactivated mumps vaccine or an unknown type of mumps vaccine before 1979 and are at high risk for mumps infection should consider immunization with 2 doses of MMR vaccine. For females of childbearing age, rubella immunity should be determined. If there is no evidence of immunity, females who are not pregnant should be vaccinated. If there is no evidence of immunity, females who are pregnant should delay immunization until after pregnancy. Unvaccinated health care workers born before 1957 who lack laboratory evidence of measles, mumps, or rubella immunity or laboratory confirmation of disease should consider measles and mumps immunization with 2 doses of MMR vaccine or rubella immunization with 1 dose of MMR vaccine.  Pneumococcal 13-valent conjugate (PCV13) vaccine. When indicated, a person who is uncertain of her immunization history and has no record of immunization should receive the PCV13 vaccine. An adult aged 19 years or older who has certain medical conditions and has not been previously immunized should receive 1 dose of PCV13 vaccine. This PCV13 should be followed with a dose of pneumococcal polysaccharide (PPSV23) vaccine. The PPSV23 vaccine dose should be obtained at least 8 weeks after the dose of PCV13 vaccine. An adult aged 19  years or older who has certain medical conditions and previously received 1 or more doses of PPSV23 vaccine should receive 1 dose of PCV13. The PCV13 vaccine dose should be obtained 1 or more years after the last PPSV23 vaccine dose.  Pneumococcal polysaccharide (PPSV23) vaccine. When PCV13 is also indicated, PCV13 should be obtained first. All adults aged 65 years and older should be immunized. An adult younger than age 65 years who has certain medical conditions should be immunized. Any person who resides in a nursing home or long-term care facility should be immunized. An adult smoker should be immunized. People with an immunocompromised condition and certain other conditions should receive both PCV13 and PPSV23 vaccines. People with human immunodeficiency virus (HIV) infection should be immunized as soon as possible after diagnosis. Immunization during chemotherapy or radiation therapy should be avoided. Routine use of PPSV23 vaccine is not recommended for American Indians, [place] Natives, or people younger than 65 years unless there are medical conditions that require PPSV23 vaccine. When indicated, people who have unknown immunization and have no record of immunization should receive PPSV23 vaccine. One-time revaccination 5 years after the first dose of PPSV23 is recommended for people aged 19-64 years who have chronic kidney failure, nephrotic syndrome, asplenia, or immunocompromised conditions. People who received 1-2 doses of PPSV23 before age 65 years should receive another dose of PPSV23 vaccine at age 65 years or later if at least 5 years have passed since the previous dose. Doses of PPSV23 are not needed for people immunized with PPSV23 at or after age 65 years.  Meningococcal vaccine. Adults with asplenia or persistent complement component deficiencies should receive 2 doses of quadrivalent meningococcal conjugate (MenACWY-D) vaccine. The doses should be obtained at least 2 months apart.  Microbiologists working with certain meningococcal bacteria, military recruits, people at risk during an outbreak, and people who travel to or live in countries with a high rate of meningitis should be immunized. A first-year college student up through age   21 years who is living in a residence hall should receive a dose if she did not receive a dose on or after her 16th birthday. Adults who have certain high-risk conditions should receive one or more doses of vaccine.  Hepatitis A vaccine. Adults who wish to be protected from this disease, have certain high-risk conditions, work with hepatitis A-infected animals, work in hepatitis A research labs, or travel to or work in countries with a high rate of hepatitis A should be immunized. Adults who were previously unvaccinated and who anticipate close contact with an international adoptee during the first 60 days after arrival in the [place] from a country with a high rate of hepatitis A should be immunized.  Hepatitis B vaccine. Adults who wish to be protected from this disease, have certain high-risk conditions, may be exposed to blood or other infectious body fluids, are household contacts or sex partners of hepatitis B positive people, are clients or workers in certain care facilities, or travel to or work in countries with a high rate of hepatitis B should be immunized.  Haemophilus influenzae type b (Hib) vaccine. A previously unvaccinated person with asplenia or sickle cell disease or having a scheduled splenectomy should receive 1 dose of Hib vaccine. Regardless of previous immunization, a recipient of a hematopoietic stem cell transplant should receive a 3-dose series 6-12 months after her successful transplant. Hib vaccine is not recommended for adults with HIV infection. Preventive Services / Frequency Ages 64 to 68 years  Blood pressure check.** / Every 1 to 2 years.  Lipid and cholesterol check.** / Every 5 years beginning at age  22.  Clinical breast exam.** / Every 3 years for women in their 88s and 53s.  BRCA-related cancer risk assessment.** / For women who have family members with a BRCA-related cancer (breast, ovarian, tubal, or peritoneal cancers).  Pap test.** / Every 2 years from ages 90 through 51. Every 3 years starting at age 21 through age 56 or 3 with a history of 3 consecutive normal Pap tests.  HPV screening.** / Every 3 years from ages 24 through ages 1 to 46 with a history of 3 consecutive normal Pap tests.  Hepatitis C blood test.** / For any individual with known risks for hepatitis C.  Skin self-exam. / Monthly.  Influenza vaccine. / Every year.  Tetanus, diphtheria, and acellular pertussis (Tdap, Td) vaccine.** / Consult your health care provider. Pregnant women should receive 1 dose of Tdap vaccine during each pregnancy. 1 dose of Td every 10 years.  Varicella vaccine.** / Consult your health care provider. Pregnant females who do not have evidence of immunity should receive the first dose after pregnancy.  HPV vaccine. / 3 doses over 6 months, if 72 and younger. The vaccine is not recommended for use in pregnant females. However, pregnancy testing is not needed before receiving a dose.  Measles, mumps, rubella (MMR) vaccine.** / You need at least 1 dose of MMR if you were born in 1957 or later. You may also need a 2nd dose. For females of childbearing age, rubella immunity should be determined. If there is no evidence of immunity, females who are not pregnant should be vaccinated. If there is no evidence of immunity, females who are pregnant should delay immunization until after pregnancy.  Pneumococcal 13-valent conjugate (PCV13) vaccine.** / Consult your health care provider.  Pneumococcal polysaccharide (PPSV23) vaccine.** / 1 to 2 doses if you smoke cigarettes or if you have certain conditions.  Meningococcal vaccine.** /  1 dose if you are age 19 to 21 years and a first-year college  student living in a residence hall, or have one of several medical conditions, you need to get vaccinated against meningococcal disease. You may also need additional booster doses.  Hepatitis A vaccine.** / Consult your health care provider.  Hepatitis B vaccine.** / Consult your health care provider.  Haemophilus influenzae type b (Hib) vaccine.** / Consult your health care provider. Ages 40 to 64 years  Blood pressure check.** / Every 1 to 2 years.  Lipid and cholesterol check.** / Every 5 years beginning at age 20 years.  Lung cancer screening. / Every year if you are aged 55-80 years and have a 30-pack-year history of smoking and currently smoke or have quit within the past 15 years. Yearly screening is stopped once you have quit smoking for at least 15 years or develop a health problem that would prevent you from having lung cancer treatment.  Clinical breast exam.** / Every year after age 40 years.  BRCA-related cancer risk assessment.** / For women who have family members with a BRCA-related cancer (breast, ovarian, tubal, or peritoneal cancers).  Mammogram.** / Every year beginning at age 40 years and continuing for as long as you are in good health. Consult with your health care provider.  Pap test.** / Every 3 years starting at age 30 years through age 65 or 70 years with a history of 3 consecutive normal Pap tests.  HPV screening.** / Every 3 years from ages 30 years through ages 65 to 70 years with a history of 3 consecutive normal Pap tests.  Fecal occult blood test (FOBT) of stool. / Every year beginning at age 50 years and continuing until age 75 years. You may not need to do this test if you get a colonoscopy every 10 years.  Flexible sigmoidoscopy or colonoscopy.** / Every 5 years for a flexible sigmoidoscopy or every 10 years for a colonoscopy beginning at age 50 years and continuing until age 75 years.  Hepatitis C blood test.** / For all people born from 1945 through  1965 and any individual with known risks for hepatitis C.  Skin self-exam. / Monthly.  Influenza vaccine. / Every year.  Tetanus, diphtheria, and acellular pertussis (Tdap/Td) vaccine.** / Consult your health care provider. Pregnant women should receive 1 dose of Tdap vaccine during each pregnancy. 1 dose of Td every 10 years.  Varicella vaccine.** / Consult your health care provider. Pregnant females who do not have evidence of immunity should receive the first dose after pregnancy.  Zoster vaccine.** / 1 dose for adults aged 60 years or older.  Measles, mumps, rubella (MMR) vaccine.** / You need at least 1 dose of MMR if you were born in 1957 or later. You may also need a 2nd dose. For females of childbearing age, rubella immunity should be determined. If there is no evidence of immunity, females who are not pregnant should be vaccinated. If there is no evidence of immunity, females who are pregnant should delay immunization until after pregnancy.  Pneumococcal 13-valent conjugate (PCV13) vaccine.** / Consult your health care provider.  Pneumococcal polysaccharide (PPSV23) vaccine.** / 1 to 2 doses if you smoke cigarettes or if you have certain conditions.  Meningococcal vaccine.** / Consult your health care provider.  Hepatitis A vaccine.** / Consult your health care provider.  Hepatitis B vaccine.** / Consult your health care provider.  Haemophilus influenzae type b (Hib) vaccine.** / Consult your health care provider. Ages 65   years and over  Blood pressure check.** / Every 1 to 2 years.  Lipid and cholesterol check.** / Every 5 years beginning at age 22 years.  Lung cancer screening. / Every year if you are aged 73-80 years and have a 30-pack-year history of smoking and currently smoke or have quit within the past 15 years. Yearly screening is stopped once you have quit smoking for at least 15 years or develop a health problem that would prevent you from having lung cancer  treatment.  Clinical breast exam.** / Every year after age 4 years.  BRCA-related cancer risk assessment.** / For women who have family members with a BRCA-related cancer (breast, ovarian, tubal, or peritoneal cancers).  Mammogram.** / Every year beginning at age 40 years and continuing for as long as you are in good health. Consult with your health care provider.  Pap test.** / Every 3 years starting at age 9 years through age 34 or 91 years with 3 consecutive normal Pap tests. Testing can be stopped between 65 and 70 years with 3 consecutive normal Pap tests and no abnormal Pap or HPV tests in the past 10 years.  HPV screening.** / Every 3 years from ages 57 years through ages 64 or 45 years with a history of 3 consecutive normal Pap tests. Testing can be stopped between 65 and 70 years with 3 consecutive normal Pap tests and no abnormal Pap or HPV tests in the past 10 years.  Fecal occult blood test (FOBT) of stool. / Every year beginning at age 15 years and continuing until age 17 years. You may not need to do this test if you get a colonoscopy every 10 years.  Flexible sigmoidoscopy or colonoscopy.** / Every 5 years for a flexible sigmoidoscopy or every 10 years for a colonoscopy beginning at age 86 years and continuing until age 71 years.  Hepatitis C blood test.** / For all people born from [birth year] through 1964 and any individual with known risks for hepatitis C.  Osteoporosis screening.** / A one-time screening for women ages 83 years and over and women at risk for fractures or osteoporosis.  Skin self-exam. / Monthly.  Influenza vaccine. / Every year.  Tetanus, diphtheria, and acellular pertussis (Tdap/Td) vaccine.** / 1 dose of Td every 10 years.  Varicella vaccine.** / Consult your health care provider.  Zoster vaccine.** / 1 dose for adults aged 61 years or older.  Pneumococcal 13-valent conjugate (PCV13) vaccine.** / Consult your health care provider.  Pneumococcal  polysaccharide (PPSV23) vaccine.** / 1 dose for all adults aged 28 years and older.  Meningococcal vaccine.** / Consult your health care provider.  Hepatitis A vaccine.** / Consult your health care provider.  Hepatitis B vaccine.** / Consult your health care provider.  Haemophilus influenzae type b (Hib) vaccine.** / Consult your health care provider. ** Family history and personal history of risk and conditions may change your health care provider's recommendations. Document Released: 03/12/2001 Document Revised: 05/31/2013 Document Reviewed: 06/11/2010 [facility] Patient Information 2015 [place], [place]. This information is not intended to replace advice given to you by your health care provider. Make sure you discuss any questions you have with your health care provider.

## 2014-07-25 NOTE — Telephone Encounter (Signed)
-----   Message from Rosalita Chessman, DO sent at 07/25/2014  3:10 PM EDT ----- Labs overall are good.    Cholesterol--- LDL goal < 100,  HDL >40,  TG < 150.  Diet and exercise will increase HDL and decrease LDL and TG.  Fish,  Fish Oil, Flaxseed oil will also help increase the HDL and decrease Triglycerides.   Recheck 1 year

## 2014-07-25 NOTE — Progress Notes (Signed)
Pre visit review using our clinic review tool, if applicable. No additional management support is needed unless otherwise documented below in the visit note. 

## 2014-07-25 NOTE — Telephone Encounter (Signed)
Letter mailed     KP 

## 2014-08-09 ENCOUNTER — Encounter (HOSPITAL_COMMUNITY): Payer: Medicare Other

## 2014-08-11 ENCOUNTER — Encounter (HOSPITAL_COMMUNITY): Payer: Medicare Other

## 2014-08-22 ENCOUNTER — Other Ambulatory Visit (HOSPITAL_COMMUNITY): Payer: Self-pay | Admitting: *Deleted

## 2014-08-23 ENCOUNTER — Encounter (HOSPITAL_COMMUNITY)
Admission: RE | Admit: 2014-08-23 | Discharge: 2014-08-23 | Disposition: A | Discharge status: Home / Self Care | Payer: Medicare Other | Source: Ambulatory Visit | Attending: Internal Medicine | Admitting: Internal Medicine

## 2014-08-23 DIAGNOSIS — G609 Hereditary and idiopathic neuropathy, unspecified: Secondary | ICD-10-CM | POA: Insufficient documentation

## 2014-08-23 DIAGNOSIS — M329 Systemic lupus erythematosus, unspecified: Secondary | ICD-10-CM | POA: Diagnosis present

## 2014-08-23 MED ORDER — DEXTROSE 5 % IV SOLN
INTRAVENOUS | Status: DC
  Administered 2014-08-23: 10:00:00 via INTRAVENOUS

## 2014-08-23 MED ORDER — DIPHENHYDRAMINE HCL 25 MG PO CAPS
25.0000 mg | ORAL_CAPSULE | Freq: Once | ORAL | Status: AC
  Administered 2014-08-23: 25 mg via ORAL

## 2014-08-23 MED ORDER — ACETAMINOPHEN 325 MG PO TABS
ORAL_TABLET | ORAL | Status: AC
Start: 2014-08-23 — End: 2014-08-23
  Filled 2014-08-23: qty 2

## 2014-08-23 MED ORDER — DIPHENHYDRAMINE HCL 25 MG PO CAPS
ORAL_CAPSULE | ORAL | Status: AC
  Filled 2014-08-23: qty 1

## 2014-08-23 MED ORDER — ACETAMINOPHEN 325 MG PO TABS
650.0000 mg | ORAL_TABLET | Freq: Four times a day (QID) | ORAL | Status: DC | PRN
  Administered 2014-08-23: 650 mg via ORAL

## 2014-08-23 MED ORDER — IMMUNE GLOBULIN (HUMAN) 10 GM/100ML IJ SOLN
70.0000 g | Freq: Once | INTRAMUSCULAR | Status: AC
  Administered 2014-08-23: 70 g via INTRAVENOUS
  Filled 2014-08-23: qty 700

## 2014-08-30 ENCOUNTER — Ambulatory Visit
Admission: RE | Admit: 2014-08-30 | Discharge: 2014-08-30 | Disposition: A | Discharge status: Home / Self Care | Payer: Medicare Other | Source: Ambulatory Visit | Attending: Family Medicine | Admitting: Family Medicine

## 2014-08-30 DIAGNOSIS — E2839 Other primary ovarian failure: Secondary | ICD-10-CM

## 2014-08-30 DIAGNOSIS — Z1239 Encounter for other screening for malignant neoplasm of breast: Secondary | ICD-10-CM

## 2014-08-31 ENCOUNTER — Other Ambulatory Visit: Payer: Self-pay | Admitting: Family Medicine

## 2014-08-31 DIAGNOSIS — R928 Other abnormal and inconclusive findings on diagnostic imaging of breast: Secondary | ICD-10-CM

## 2014-09-02 ENCOUNTER — Ambulatory Visit
Admission: RE | Admit: 2014-09-02 | Discharge: 2014-09-02 | Disposition: A | Discharge status: Home / Self Care | Payer: Medicare Other | Source: Ambulatory Visit | Attending: Family Medicine | Admitting: Family Medicine

## 2014-09-02 DIAGNOSIS — R928 Other abnormal and inconclusive findings on diagnostic imaging of breast: Secondary | ICD-10-CM

## 2014-09-13 ENCOUNTER — Encounter: Payer: Self-pay | Admitting: Family Medicine

## 2014-10-17 ENCOUNTER — Other Ambulatory Visit (HOSPITAL_COMMUNITY): Payer: Self-pay | Admitting: *Deleted

## 2014-10-18 ENCOUNTER — Encounter (HOSPITAL_COMMUNITY)
Admission: RE | Admit: 2014-10-18 | Discharge: 2014-10-18 | Disposition: A | Discharge status: Home / Self Care | Payer: Medicare Other | Source: Ambulatory Visit | Attending: Internal Medicine | Admitting: Internal Medicine

## 2014-10-18 DIAGNOSIS — G609 Hereditary and idiopathic neuropathy, unspecified: Secondary | ICD-10-CM | POA: Diagnosis not present

## 2014-10-18 DIAGNOSIS — M329 Systemic lupus erythematosus, unspecified: Secondary | ICD-10-CM | POA: Diagnosis present

## 2014-10-18 MED ORDER — DIPHENHYDRAMINE HCL 25 MG PO CAPS
ORAL_CAPSULE | ORAL | Status: AC
  Filled 2014-10-18: qty 1

## 2014-10-18 MED ORDER — DIPHENHYDRAMINE HCL 25 MG PO TABS
25.0000 mg | ORAL_TABLET | Freq: Once | ORAL | Status: AC
  Administered 2014-10-18: 25 mg via ORAL
  Filled 2014-10-18: qty 1

## 2014-10-18 MED ORDER — IMMUNE GLOBULIN (HUMAN) 10 GM/100ML IJ SOLN
70.0000 g | Freq: Once | INTRAMUSCULAR | Status: AC
  Administered 2014-10-18: 70 g via INTRAVENOUS
  Filled 2014-10-18: qty 700

## 2014-10-18 MED ORDER — ACETAMINOPHEN 325 MG PO TABS
ORAL_TABLET | ORAL | Status: AC
  Filled 2014-10-18: qty 2

## 2014-10-18 MED ORDER — DEXTROSE 5 % IV SOLN
Freq: Once | INTRAVENOUS | Status: AC
  Administered 2014-10-18: 10:00:00 via INTRAVENOUS

## 2014-10-18 MED ORDER — ACETAMINOPHEN 325 MG PO TABS
650.0000 mg | ORAL_TABLET | Freq: Once | ORAL | Status: AC
  Administered 2014-10-18: 650 mg via ORAL

## 2014-12-13 ENCOUNTER — Encounter (HOSPITAL_COMMUNITY)
Admission: RE | Admit: 2014-12-13 | Discharge: 2014-12-13 | Disposition: A | Discharge status: Home / Self Care | Payer: Medicare Other | Source: Ambulatory Visit | Attending: Internal Medicine | Admitting: Internal Medicine

## 2014-12-13 DIAGNOSIS — M329 Systemic lupus erythematosus, unspecified: Secondary | ICD-10-CM | POA: Insufficient documentation

## 2014-12-13 DIAGNOSIS — G609 Hereditary and idiopathic neuropathy, unspecified: Secondary | ICD-10-CM | POA: Insufficient documentation

## 2014-12-13 MED ORDER — ACETAMINOPHEN 325 MG PO TABS
ORAL_TABLET | ORAL | Status: AC
  Filled 2014-12-13: qty 2

## 2014-12-13 MED ORDER — IMMUNE GLOBULIN (HUMAN) 10 GM/100ML IJ SOLN
70.0000 g | Freq: Once | INTRAMUSCULAR | Status: AC
  Administered 2014-12-13: 70 g via INTRAVENOUS
  Filled 2014-12-13 (×2): qty 700

## 2014-12-13 MED ORDER — ACETAMINOPHEN 325 MG PO TABS
650.0000 mg | ORAL_TABLET | Freq: Once | ORAL | Status: AC
  Administered 2014-12-13: 650 mg via ORAL

## 2014-12-13 MED ORDER — DIPHENHYDRAMINE HCL 25 MG PO CAPS
ORAL_CAPSULE | ORAL | Status: AC
  Filled 2014-12-13: qty 1

## 2014-12-13 MED ORDER — DIPHENHYDRAMINE HCL 25 MG PO TABS
25.0000 mg | ORAL_TABLET | Freq: Once | ORAL | Status: AC
  Administered 2014-12-13: 25 mg via ORAL
  Filled 2014-12-13: qty 1

## 2014-12-13 MED ORDER — DEXTROSE 5 % IV SOLN
Freq: Once | INTRAVENOUS | Status: AC
  Administered 2014-12-13: 09:00:00 via INTRAVENOUS

## 2015-02-07 ENCOUNTER — Inpatient Hospital Stay (HOSPITAL_COMMUNITY): Admission: RE | Admit: 2015-02-07 | Payer: Medicare Other | Source: Ambulatory Visit

## 2015-02-14 ENCOUNTER — Encounter (HOSPITAL_COMMUNITY)
Admission: RE | Admit: 2015-02-14 | Discharge: 2015-02-14 | Disposition: A | Discharge status: Home / Self Care | Payer: Medicare Other | Source: Ambulatory Visit | Attending: Internal Medicine | Admitting: Internal Medicine

## 2015-02-14 DIAGNOSIS — G609 Hereditary and idiopathic neuropathy, unspecified: Secondary | ICD-10-CM | POA: Diagnosis not present

## 2015-02-14 DIAGNOSIS — M329 Systemic lupus erythematosus, unspecified: Secondary | ICD-10-CM | POA: Insufficient documentation

## 2015-02-14 MED ORDER — IMMUNE GLOBULIN (HUMAN) 10 GM/100ML IJ SOLN
70.0000 g | INTRAMUSCULAR | Status: DC
  Administered 2015-02-14: 70 g via INTRAVENOUS
  Filled 2015-02-14 (×2): qty 700

## 2015-02-14 MED ORDER — ACETAMINOPHEN 325 MG PO TABS
650.0000 mg | ORAL_TABLET | ORAL | Status: DC
  Administered 2015-02-14: 650 mg via ORAL

## 2015-02-14 MED ORDER — DIPHENHYDRAMINE HCL 25 MG PO TABS
25.0000 mg | ORAL_TABLET | ORAL | Status: DC
  Administered 2015-02-14: 25 mg via ORAL
  Filled 2015-02-14: qty 1

## 2015-02-14 MED ORDER — IMMUNE GLOBULIN (HUMAN) 1 GM/10ML IJ SOLN
70.0000 g | INTRAMUSCULAR | Status: DC
  Filled 2015-02-14: qty 700

## 2015-02-14 MED ORDER — ACETAMINOPHEN 325 MG PO TABS
ORAL_TABLET | ORAL | Status: AC
  Filled 2015-02-14: qty 2

## 2015-02-14 MED ORDER — DIPHENHYDRAMINE HCL 25 MG PO CAPS
ORAL_CAPSULE | ORAL | Status: AC
  Filled 2015-02-14: qty 1

## 2015-02-14 MED ORDER — DEXTROSE 5 % IV SOLN
INTRAVENOUS | Status: DC
  Administered 2015-02-14: 13:00:00 via INTRAVENOUS

## 2015-04-10 ENCOUNTER — Other Ambulatory Visit (HOSPITAL_COMMUNITY): Payer: Self-pay | Admitting: *Deleted

## 2015-04-11 ENCOUNTER — Ambulatory Visit (HOSPITAL_COMMUNITY)
Admission: RE | Admit: 2015-04-11 | Discharge: 2015-04-11 | Disposition: A | Discharge status: Home / Self Care | Payer: Medicare Other | Source: Ambulatory Visit | Attending: Internal Medicine | Admitting: Internal Medicine

## 2015-04-11 DIAGNOSIS — G629 Polyneuropathy, unspecified: Secondary | ICD-10-CM | POA: Diagnosis not present

## 2015-04-11 DIAGNOSIS — M329 Systemic lupus erythematosus, unspecified: Secondary | ICD-10-CM | POA: Insufficient documentation

## 2015-04-11 MED ORDER — ACETAMINOPHEN 325 MG PO TABS
ORAL_TABLET | ORAL | Status: AC
  Administered 2015-04-11: 650 mg
  Filled 2015-04-11: qty 2

## 2015-04-11 MED ORDER — DIPHENHYDRAMINE HCL 25 MG PO CAPS
25.0000 mg | ORAL_CAPSULE | Freq: Once | ORAL | Status: AC
  Administered 2015-04-11: 25 mg via ORAL

## 2015-04-11 MED ORDER — DIPHENHYDRAMINE HCL 25 MG PO CAPS
ORAL_CAPSULE | ORAL | Status: AC
  Filled 2015-04-11: qty 1

## 2015-04-11 MED ORDER — DEXTROSE 5 % IV SOLN
INTRAVENOUS | Status: DC
  Administered 2015-04-11: 09:00:00 via INTRAVENOUS

## 2015-04-11 MED ORDER — ACETAMINOPHEN 325 MG PO TABS: 650.0000 mg | ORAL_TABLET | Freq: Once | ORAL | Status: DC

## 2015-04-11 MED ORDER — IMMUNE GLOBULIN (HUMAN) 10 GM/100ML IJ SOLN
70.0000 g | Freq: Once | INTRAMUSCULAR | Status: AC
Start: 2015-04-11 — End: 2015-04-11
  Administered 2015-04-11: 70 g via INTRAVENOUS
  Filled 2015-04-11: qty 700

## 2015-06-05 ENCOUNTER — Other Ambulatory Visit (HOSPITAL_COMMUNITY): Payer: Self-pay | Admitting: *Deleted

## 2015-06-06 ENCOUNTER — Encounter (HOSPITAL_COMMUNITY)
Admission: RE | Admit: 2015-06-06 | Discharge: 2015-06-06 | Disposition: A | Discharge status: Home / Self Care | Payer: Medicare Other | Source: Ambulatory Visit | Attending: Internal Medicine | Admitting: Internal Medicine

## 2015-06-06 DIAGNOSIS — G629 Polyneuropathy, unspecified: Secondary | ICD-10-CM | POA: Insufficient documentation

## 2015-06-06 DIAGNOSIS — M329 Systemic lupus erythematosus, unspecified: Secondary | ICD-10-CM | POA: Insufficient documentation

## 2015-06-06 MED ORDER — DIPHENHYDRAMINE HCL 25 MG PO TABS
25.0000 mg | ORAL_TABLET | Freq: Once | ORAL | Status: AC
  Administered 2015-06-06: 25 mg via ORAL
  Filled 2015-06-06: qty 1

## 2015-06-06 MED ORDER — DIPHENHYDRAMINE HCL 25 MG PO CAPS
ORAL_CAPSULE | ORAL | Status: AC
  Filled 2015-06-06: qty 1

## 2015-06-06 MED ORDER — ACETAMINOPHEN 325 MG PO TABS
ORAL_TABLET | ORAL | Status: AC
  Filled 2015-06-06: qty 2

## 2015-06-06 MED ORDER — DEXTROSE 5 % IV SOLN
INTRAVENOUS | Status: DC
  Administered 2015-06-06: 10:00:00 via INTRAVENOUS

## 2015-06-06 MED ORDER — IMMUNE GLOBULIN (HUMAN) 10 GM/100ML IJ SOLN
70.0000 g | Freq: Once | INTRAMUSCULAR | Status: AC
  Administered 2015-06-06: 70 g via INTRAVENOUS
  Filled 2015-06-06: qty 700

## 2015-06-06 MED ORDER — IMMUNE GLOBULIN (HUMAN) 1 GM/10ML IJ SOLN: 20.0000 g | Freq: Once | INTRAMUSCULAR | Status: DC

## 2015-06-06 MED ORDER — ACETAMINOPHEN 325 MG PO TABS
650.0000 mg | ORAL_TABLET | Freq: Once | ORAL | Status: AC
  Administered 2015-06-06: 650 mg via ORAL

## 2015-06-23 ENCOUNTER — Ambulatory Visit (INDEPENDENT_AMBULATORY_CARE_PROVIDER_SITE_OTHER): Payer: Medicare Other | Admitting: Family Medicine

## 2015-06-23 ENCOUNTER — Ambulatory Visit (HOSPITAL_BASED_OUTPATIENT_CLINIC_OR_DEPARTMENT_OTHER)
Admission: RE | Admit: 2015-06-23 | Discharge: 2015-06-23 | Disposition: A | Discharge status: Home / Self Care | Payer: Medicare Other | Source: Ambulatory Visit | Attending: Family Medicine | Admitting: Family Medicine

## 2015-06-23 ENCOUNTER — Telehealth: Payer: Self-pay

## 2015-06-23 ENCOUNTER — Encounter: Payer: Self-pay | Admitting: Family Medicine

## 2015-06-23 VITALS — BP 180/110 | HR 82 | Temp 98.4°F | Ht 68.0 in | Wt 163.0 lb

## 2015-06-23 DIAGNOSIS — K219 Gastro-esophageal reflux disease without esophagitis: Secondary | ICD-10-CM | POA: Diagnosis not present

## 2015-06-23 DIAGNOSIS — I10 Essential (primary) hypertension: Secondary | ICD-10-CM

## 2015-06-23 DIAGNOSIS — R1013 Epigastric pain: Secondary | ICD-10-CM | POA: Diagnosis not present

## 2015-06-23 DIAGNOSIS — N281 Cyst of kidney, acquired: Secondary | ICD-10-CM | POA: Insufficient documentation

## 2015-06-23 LAB — LIPID PANEL
Cholesterol: 218 mg/dL — ABNORMAL HIGH (ref 0–200)
HDL: 53.6 mg/dL (ref >39.00)
LDL Cholesterol: 149 mg/dL — ABNORMAL HIGH (ref 0–99)
NonHDL: 164.08
Total CHOL/HDL Ratio: 4
Triglycerides: 76 mg/dL (ref 0.0–149.0)
VLDL: 15.2 mg/dL (ref 0.0–40.0)

## 2015-06-23 LAB — CBC WITH DIFFERENTIAL/PLATELET
Basophils Absolute: 0 10*3/uL (ref 0.0–0.1)
Basophils Relative: 0.6 % (ref 0.0–3.0)
Eosinophils Absolute: 0.1 10*3/uL (ref 0.0–0.7)
Eosinophils Relative: 2.9 % (ref 0.0–5.0)
HCT: 41.5 % (ref 36.0–46.0)
Hemoglobin: 13.6 g/dL (ref 12.0–15.0)
Lymphocytes Relative: 33.3 % (ref 12.0–46.0)
Lymphs Abs: 1.4 10*3/uL (ref 0.7–4.0)
MCHC: 32.8 g/dL (ref 30.0–36.0)
MCV: 86.6 fl (ref 78.0–100.0)
Monocytes Absolute: 0.4 10*3/uL (ref 0.1–1.0)
Monocytes Relative: 8.9 % (ref 3.0–12.0)
Neutro Abs: 2.2 10*3/uL (ref 1.4–7.7)
Neutrophils Relative %: 54.3 % (ref 43.0–77.0)
Platelets: 184 10*3/uL (ref 150.0–400.0)
RBC: 4.79 Mil/uL (ref 3.87–5.11)
RDW: 14.1 % (ref 11.5–15.5)
WBC: 4.1 10*3/uL (ref 4.0–10.5)

## 2015-06-23 LAB — POCT URINALYSIS DIPSTICK
Bilirubin, UA: NEGATIVE
Blood, UA: NEGATIVE
Glucose, UA: NEGATIVE
Ketones, UA: NEGATIVE
Leukocytes, UA: NEGATIVE
Nitrite, UA: NEGATIVE
Protein, UA: NEGATIVE
Spec Grav, UA: 1.025
Urobilinogen, UA: NEGATIVE
pH, UA: 6

## 2015-06-23 LAB — COMPREHENSIVE METABOLIC PANEL
ALT: 24 U/L (ref 0–35)
AST: 28 U/L (ref 0–37)
Albumin: 4.1 g/dL (ref 3.5–5.2)
Alkaline Phosphatase: 47 U/L (ref 39–117)
BUN: 20 mg/dL (ref 6–23)
CO2: 25 mEq/L (ref 19–32)
Calcium: 9.2 mg/dL (ref 8.4–10.5)
Chloride: 111 mEq/L (ref 96–112)
Creatinine, Ser: 0.94 mg/dL (ref 0.40–1.20)
GFR: 79.8 mL/min (ref >60.00)
Glucose, Bld: 81 mg/dL (ref 70–99)
Potassium: 4.3 mEq/L (ref 3.5–5.1)
Sodium: 139 mEq/L (ref 135–145)
Total Bilirubin: 0.3 mg/dL (ref 0.2–1.2)
Total Protein: 7.5 g/dL (ref 6.0–8.3)

## 2015-06-23 LAB — H. PYLORI ANTIBODY, IGG: H Pylori IgG: POSITIVE — AB

## 2015-06-23 MED ORDER — PANTOPRAZOLE SODIUM 40 MG PO TBEC: 40.0000 mg | DELAYED_RELEASE_TABLET | Freq: Every day | ORAL | Status: DC

## 2015-06-23 MED ORDER — METOPROLOL SUCCINATE ER 50 MG PO TB24: 50.0000 mg | ORAL_TABLET | Freq: Every day | ORAL | Status: DC

## 2015-06-23 MED ORDER — PANTOPRAZOLE SODIUM 40 MG PO TBEC: 40.0000 mg | DELAYED_RELEASE_TABLET | Freq: Two times a day (BID) | ORAL | Status: DC

## 2015-06-23 MED ORDER — GI COCKTAIL ~~LOC~~
30.0000 mL | Freq: Once | ORAL | Status: AC
  Administered 2015-06-23: 30 mL via ORAL

## 2015-06-23 MED ORDER — AMOXICILLIN 500 MG PO CAPS: 1000.0000 mg | ORAL_CAPSULE | Freq: Two times a day (BID) | ORAL | Status: DC

## 2015-06-23 MED ORDER — CLARITHROMYCIN 500 MG PO TABS: 500.0000 mg | ORAL_TABLET | Freq: Two times a day (BID) | ORAL | Status: DC

## 2015-06-23 NOTE — Progress Notes (Signed)
Subjective:    Patient ID: Samantha [name], female    DOB: 07-Sep-1960, 55 y.o.   MRN: [id number]  HPI  Patient here for f/u.  She c/o headaches and states other specialists have told her her bp is running high.  She also c/o midepigastric pain and and nausea with dry heaves x several weeks.  No chest pain or sob.  othewise pt is going well.  Walking much better --- without a walker or cane.     Past Medical History  Diagnosis Date  . Unspecified essential hypertension   . Backache, unspecified   . Altered mental status   . Gastric ulcer, unspecified as acute or chronic, without mention of hemorrhage, perforation, or obstruction   . Helicobacter pylori (H. pylori)   . Tobacco abuse     Review of Systems  Constitutional: Negative for diaphoresis, appetite change, fatigue and unexpected weight change.  Eyes: Negative for pain, redness and visual disturbance.  Respiratory: Negative for cough, chest tightness, shortness of breath and wheezing.   Cardiovascular: Negative for chest pain, palpitations and leg swelling.  Gastrointestinal: Positive for nausea and abdominal pain.       + dry heaves  Endocrine: Negative for cold intolerance, heat intolerance, polydipsia, polyphagia and polyuria.  Genitourinary: Negative for dysuria, frequency and difficulty urinating.  Neurological: Positive for headaches. Negative for dizziness, light-headedness and numbness.       Objective:    Physical Exam  Constitutional: She is oriented to person, place, and time. She appears well-developed and well-nourished.  HENT:  Head: Normocephalic and atraumatic.  Eyes: Conjunctivae and EOM are normal.  Neck: Normal range of motion. Neck supple. No JVD present. Carotid bruit is not present. No thyromegaly present.  Cardiovascular: Normal rate, regular rhythm and normal heart sounds.   No murmur heard. Pulmonary/Chest: Effort normal and breath sounds normal. No respiratory distress. She has no wheezes.  She has no rales. She exhibits no tenderness.  Abdominal: She exhibits no mass. There is tenderness in the right upper quadrant and epigastric area. There is guarding. There is no rebound.  Musculoskeletal: She exhibits no edema.  Neurological: She is alert and oriented to person, place, and time.  Psychiatric: She has a normal mood and affect.  Nursing note and vitals reviewed.   BP 180/110 mmHg  Pulse 82  Temp(Src) 98.4 F (36.9 C) (Oral)  Ht 5\' 8"  (1.727 m)  Wt 163 lb (73.936 kg)  BMI 24.79 kg/m2  SpO2 98%  LMP 04/11/2011 Wt Readings from Last 3 Encounters:  06/23/15 163 lb (73.936 kg)  06/06/15 142 lb (64.411 kg)  04/11/15 142 lb (64.411 kg)     Lab Results  Component Value Date   WBC 3.7* 07/25/2014   HGB 13.1 07/25/2014   HCT 39.7 07/25/2014   PLT 189.0 07/25/2014   GLUCOSE 76 07/25/2014   CHOL 214* 07/25/2014   TRIG 74.0 07/25/2014   HDL 60.40 07/25/2014   LDLDIRECT 141.8 09/15/2012   LDLCALC 139* 07/25/2014   ALT 26 07/25/2014   AST 28 07/25/2014   NA 139 07/25/2014   K 3.8 07/25/2014   CL 108 07/25/2014   CREATININE 0.66 07/25/2014   BUN 13 07/25/2014   CO2 27 07/25/2014   TSH 0.83 09/15/2012   INR 1.14 02/26/2011   HGBA1C 5.6 03/13/2011    No results found.     Assessment & Plan:   Problem List Items Addressed This Visit      Unprioritized   Abdominal pain, epigastric  With some ruq pain Pt very tender on exam-- check [place] GI cocktail helped very little      Relevant Medications   gi cocktail (Maalox,Lidocaine,Donnatal) (Completed)   Other Relevant Orders   Comprehensive metabolic panel   POCT urinalysis dipstick   Lipid panel   CBC with Differential/Platelet   H. pylori antibody, IgG   US Abdomen Complete   HTN (hypertension) - Primary    toprol started F/u 2-3 weeks or sooner prn      Relevant Medications   metoprolol succinate (TOPROL-XL) 50 MG 24 hr tablet   Other Relevant Orders   Comprehensive metabolic panel   POCT  urinalysis dipstick   Lipid panel   CBC with Differential/Platelet   H. pylori antibody, IgG    Other Visit Diagnoses    Gastroesophageal reflux disease, esophagitis presence not specified        Relevant Medications    pantoprazole (PROTONIX) 40 MG tablet    gi cocktail (Maalox,Lidocaine,Donnatal) (Completed)    Other Relevant Orders    Ambulatory referral to Gastroenterology    US Abdomen Complete        [name], DO

## 2015-06-23 NOTE — Patient Instructions (Signed)
Food Choices for Gastroesophageal Reflux Disease, Adult When you have gastroesophageal reflux disease (GERD), the foods you eat and your eating habits are very important. Choosing the right foods can help ease the discomfort of GERD. WHAT GENERAL GUIDELINES DO I NEED TO FOLLOW?  Choose fruits, vegetables, whole grains, low-fat dairy products, and low-fat meat, fish, and poultry.  Limit fats such as oils, salad dressings, butter, nuts, and avocado.  Keep a food diary to identify foods that cause symptoms.  Avoid foods that cause reflux. These may be different for different people.  Eat frequent small meals instead of three large meals each day.  Eat your meals slowly, in a relaxed setting.  Limit fried foods.  Cook foods using methods other than frying.  Avoid drinking alcohol.  Avoid drinking large amounts of liquids with your meals.  Avoid bending over or lying down until 2-3 hours after eating. WHAT FOODS ARE NOT RECOMMENDED? The following are some foods and drinks that may worsen your symptoms: Vegetables Tomatoes. Tomato juice. Tomato and spaghetti sauce. Chili peppers. Onion and garlic. Horseradish. Fruits Oranges, grapefruit, and lemon (fruit and juice). Meats High-fat meats, fish, and poultry. This includes hot dogs, ribs, ham, sausage, salami, and bacon. Dairy Whole milk and chocolate milk. Sour cream. Cream. Butter. Ice cream. Cream cheese.  Beverages Coffee and tea, with or without caffeine. Carbonated beverages or energy drinks. Condiments Hot sauce. Barbecue sauce.  Sweets/Desserts Chocolate and cocoa. Donuts. Peppermint and spearmint. Fats and Oils High-fat foods, including French fries and potato chips. Other Vinegar. Strong spices, such as black pepper, white pepper, red pepper, cayenne, curry powder, cloves, ginger, and chili powder. The items listed above may not be a complete list of foods and beverages to avoid. Contact your dietitian for more  information.   This information is not intended to replace advice given to you by your health care provider. Make sure you discuss any questions you have with your health care provider.   Document Released: 01/14/2005 Document Revised: 02/04/2014 Document Reviewed: 11/18/2012 Elsevier Interactive Patient Education 2016 Elsevier Inc.  

## 2015-06-23 NOTE — Telephone Encounter (Signed)
-----   Message from [name], DO sent at 06/23/2015  2:33 PM EDT ----- + h pylori- amoxicilin 500 mg 2 po bid x 14 days  biaxin 500 mg bid x 14 days  Take the protonix bid for 2 weeks then go back to 1 a day Cholesterol--- LDL goal < 10,  HDL >40,  TG < 150.  Diet and exercise will increase HDL and decrease LDL and TG.  Fish,  Fish Oil, Flaxseed oil will also help increase the HDL and decrease Triglycerides.   Recheck labs in 3 months-- start lipitor 20 mg #30 1 po qhs, 2 refills.

## 2015-06-23 NOTE — Telephone Encounter (Signed)
Discussed with the patient and she verbalized understanding, she has agreed to take the medication for the H.pylori but will watch her diet to help get her cholesterol own. Med's have been sent to the pharmacy.    KP

## 2015-06-23 NOTE — Assessment & Plan Note (Signed)
toprol started F/u 2-3 weeks or sooner prn

## 2015-06-23 NOTE — Progress Notes (Signed)
Pre visit review using our clinic review tool, if applicable. No additional management support is needed unless otherwise documented below in the visit note. 

## 2015-06-23 NOTE — Assessment & Plan Note (Signed)
With some ruq pain Pt very tender on exam-- check [place] GI cocktail helped very little

## 2015-06-27 ENCOUNTER — Other Ambulatory Visit: Payer: Self-pay | Admitting: Family Medicine

## 2015-06-27 ENCOUNTER — Telehealth: Payer: Self-pay | Admitting: Family Medicine

## 2015-06-27 DIAGNOSIS — M545 Low back pain, unspecified: Secondary | ICD-10-CM

## 2015-06-27 MED ORDER — OXYCODONE-ACETAMINOPHEN 10-325 MG PO TABS: 1.0000 | ORAL_TABLET | Freq: Three times a day (TID) | ORAL | Status: DC | PRN

## 2015-06-27 NOTE — Telephone Encounter (Signed)
printed

## 2015-06-27 NOTE — Telephone Encounter (Signed)
Caller name: Self  Can be reached: [phone]    Reason for call: Request refill on oxyCODONE-acetaminophen (PERCOCET) 10-325 MG per tablet [id number]  Also would like her Discharge Instructions from her last appointment mailed to her.

## 2015-06-27 NOTE — Telephone Encounter (Signed)
Pt is requesting refill on Oxycodone.  Last OV: 06/23/2015 Last Fill: 12/20/2013 #60 and 0RF UDS: None  Please advise .

## 2015-06-27 NOTE — Telephone Encounter (Signed)
Patient aware Rx ready for pick up.      KP 

## 2015-06-28 MED FILL — OXYCODONE-APAP 10-325 TAB: 10-325 | 20 days supply | Qty: 60 | Fill #0

## 2015-07-29 ENCOUNTER — Telehealth: Payer: Self-pay

## 2015-07-29 NOTE — Telephone Encounter (Signed)
Patient is on the list for [facility] 2017 and may be a good candidate for an AWV in 2017. Please let me know if/when appt is scheduled.   

## 2015-08-02 ENCOUNTER — Ambulatory Visit (INDEPENDENT_AMBULATORY_CARE_PROVIDER_SITE_OTHER): Payer: Medicare Other | Admitting: Gastroenterology

## 2015-08-02 ENCOUNTER — Encounter: Payer: Self-pay | Admitting: Gastroenterology

## 2015-08-02 VITALS — BP 150/80 | HR 72 | Ht 67.0 in | Wt 161.0 lb

## 2015-08-02 DIAGNOSIS — R0789 Other chest pain: Secondary | ICD-10-CM | POA: Diagnosis not present

## 2015-08-02 DIAGNOSIS — M329 Systemic lupus erythematosus, unspecified: Secondary | ICD-10-CM | POA: Diagnosis not present

## 2015-08-02 NOTE — Progress Notes (Signed)
[facility] Consult Note:  History: Samantha [name] 08/02/2015  Referring physician: [name], DO  Reason for consult/chief complaint: Gastroesophageal Reflux and Abdominal Pain   Subjective HPI:  This is a 55 year old woman sent for reevaluation after her last visit with Dr. Deatra Ina in March 2013. At that time she had musculoskeletal chest wall pain, also some underlying GERD and then had a normal upper endoscopy. She describes 102 episodes a week of some spasm and tightness of the lower chest wall the last about 5 minutes, it is nearly always exacerbated by bending lifting or twisting. She denies early satiety nausea vomiting or weight loss.  ROS:  Review of Systems  Constitutional: Negative for appetite change and unexpected weight change.  HENT: Negative for mouth sores and voice change.   Eyes: Negative for pain and redness.  Respiratory: Negative for cough and shortness of breath.   Cardiovascular: Negative for chest pain and palpitations.  Genitourinary: Negative for dysuria and hematuria.  Musculoskeletal: Positive for myalgias. Negative for arthralgias.  Skin: Negative for pallor and rash.  Neurological: Negative for weakness and headaches.  Hematological: Negative for adenopathy.     Past Medical History: Past Medical History  Diagnosis Date  . Unspecified essential hypertension   . Backache, unspecified   . Altered mental status   . Gastric ulcer, unspecified as acute or chronic, without mention of hemorrhage, perforation, or obstruction   . Helicobacter pylori (H. pylori)   . Tobacco abuse      Past Surgical History: Past Surgical History  Procedure Laterality Date  . Upper gastrointestinal endoscopy  2013     Family History: Family History  Problem Relation Age of Onset  . Diabetes Sister   . Hypertension Mother   . Colon cancer Neg Hx     Social History: Social History   Social History  . Marital Status: Single   Spouse Name: N/A  . Number of Children: N/A  . Years of Education: N/A   Occupational History  . permanant disability [employer]   Social History Main Topics  . Smoking status: Current Every Day Smoker -- 0.01 packs/day for 35 years    Types: Cigarettes  . Smokeless tobacco: Never Used     Comment: smokes one cigarette daily per pt.  . Alcohol Use: Yes     Comment: occassional; beer   . Drug Use: No  . Sexual Activity:    Partners: Male   Other Topics Concern  . None   Social History Narrative   Exercise--- walking    Allergies: Allergies  Allergen Reactions  . Tramadol Nausea Only    Feels weirded out    Outpatient Meds: Current Outpatient Prescriptions  Medication Sig Dispense Refill  . cyclobenzaprine (FLEXERIL) 10 MG tablet Take 1 tablet (10 mg total) by mouth 3 (three) times daily as needed. 30 tablet 2  . hydroxychloroquine (PLAQUENIL) 200 MG tablet Take 200 mg by mouth 2 (two) times daily.    . Immune Globulin 10% (PRIVIGEN) 10 GM/100ML SOLN Inject 1 g/kg into the vein once.    . metoprolol succinate (TOPROL-XL) 50 MG 24 hr tablet Take 1 tablet (50 mg total) by mouth daily. Take with or immediately following a meal. 30 tablet 2  . Multiple Vitamin (MULTIVITAMIN) tablet Take 1 tablet by mouth daily.    . mycophenolate (CELLCEPT) 500 MG tablet Take 1,500 mg by mouth 2 (two) times daily.     . NONFORMULARY OR COMPOUNDED ITEM Cane As directed 1  each 0  . oxyCODONE-acetaminophen (PERCOCET) 10-325 MG tablet Take 1 tablet by mouth every 8 (eight) hours as needed. 60 tablet 0  . pantoprazole (PROTONIX) 40 MG tablet Take 1 tablet (40 mg total) by mouth 2 (two) times daily. For 14 days for H.Pylori 28 tablet 0   No current facility-administered medications for this visit.      ___________________________________________________________________ Objective  Exam:  BP 150/80 mmHg  Pulse 72  Ht 5\' 7"  (1.702 m)  Wt 161 lb (73.029 kg)  BMI 25.21 kg/m2  LMP  04/11/2011   General: this is a(n) Well-appearing middle-aged woman   Eyes: sclera anicteric, no redness  ENT: oral mucosa moist without lesions, no cervical or supraclavicular lymphadenopathy, good dentition  CV: RRR without murmur, S1/S2, no JVD, no peripheral edema  Resp: clear to auscultation bilaterally, normal RR and effort noted  GI: soft, tenderness over the xiphoid process, with active bowel sounds. No guarding or palpable organomegaly noted.  Skin; warm and dry, no rash or jaundice noted  Neuro: awake, alert and oriented x 3. Normal gross motor function and fluent speech  Assessment: Encounter Diagnoses  Name Primary?  . Chest wall pain Yes  . Lupus (systemic lupus erythematosus) ([place])     This is musculoskeletal tenderness.  Plan:  Refer back to primary care and rheumatology for further management. No further GI testing necessary at this point.   Thank you for the courtesy of this consult.  Please call me with any questions or concerns.  Nelida Meuse III  CC: [name], DO

## 2015-08-02 NOTE — Patient Instructions (Signed)
Please follow up as needed.   Thank you for choosing [facility]  Dr Wilfrid Lund III

## 2015-08-08 ENCOUNTER — Ambulatory Visit (HOSPITAL_COMMUNITY)
Admission: RE | Admit: 2015-08-08 | Discharge: 2015-08-08 | Disposition: A | Discharge status: Home / Self Care | Payer: Medicare Other | Source: Ambulatory Visit | Attending: Internal Medicine | Admitting: Internal Medicine

## 2015-08-08 DIAGNOSIS — G629 Polyneuropathy, unspecified: Secondary | ICD-10-CM | POA: Diagnosis not present

## 2015-08-08 DIAGNOSIS — M329 Systemic lupus erythematosus, unspecified: Secondary | ICD-10-CM | POA: Diagnosis present

## 2015-08-08 MED ORDER — DIPHENHYDRAMINE HCL 25 MG PO CAPS
25.0000 mg | ORAL_CAPSULE | Freq: Once | ORAL | Status: AC
  Administered 2015-08-08: 25 mg via ORAL

## 2015-08-08 MED ORDER — DIPHENHYDRAMINE HCL 25 MG PO CAPS
ORAL_CAPSULE | ORAL | Status: AC
  Filled 2015-08-08: qty 1

## 2015-08-08 MED ORDER — ACETAMINOPHEN 325 MG PO TABS
ORAL_TABLET | ORAL | Status: AC
  Filled 2015-08-08: qty 2

## 2015-08-08 MED ORDER — ACETAMINOPHEN 325 MG PO TABS
650.0000 mg | ORAL_TABLET | Freq: Once | ORAL | Status: AC
  Administered 2015-08-08: 650 mg via ORAL

## 2015-08-08 MED ORDER — IMMUNE GLOBULIN (HUMAN) 10 GM/100ML IJ SOLN
70.0000 g | Freq: Once | INTRAMUSCULAR | Status: AC
  Administered 2015-08-08: 70 g via INTRAVENOUS
  Filled 2015-08-08: qty 700

## 2015-08-08 MED ORDER — DEXTROSE 5 % IV SOLN
INTRAVENOUS | Status: DC
  Administered 2015-08-08: 10:00:00 via INTRAVENOUS

## 2015-08-14 NOTE — Telephone Encounter (Signed)
LVM advising patient regarding message below

## 2015-09-08 ENCOUNTER — Ambulatory Visit (INDEPENDENT_AMBULATORY_CARE_PROVIDER_SITE_OTHER): Payer: Medicare Other | Admitting: Family Medicine

## 2015-09-08 ENCOUNTER — Encounter: Payer: Self-pay | Admitting: Family Medicine

## 2015-09-08 VITALS — BP 120/78 | HR 78 | Temp 97.7°F | Wt 162.4 lb

## 2015-09-08 DIAGNOSIS — F17200 Nicotine dependence, unspecified, uncomplicated: Secondary | ICD-10-CM | POA: Diagnosis not present

## 2015-09-08 DIAGNOSIS — I10 Essential (primary) hypertension: Secondary | ICD-10-CM | POA: Diagnosis not present

## 2015-09-08 DIAGNOSIS — K219 Gastro-esophageal reflux disease without esophagitis: Secondary | ICD-10-CM

## 2015-09-08 DIAGNOSIS — E785 Hyperlipidemia, unspecified: Secondary | ICD-10-CM

## 2015-09-08 LAB — COMPREHENSIVE METABOLIC PANEL
ALT: 18 U/L (ref 0–35)
AST: 24 U/L (ref 0–37)
Albumin: 3.9 g/dL (ref 3.5–5.2)
Alkaline Phosphatase: 45 U/L (ref 39–117)
BUN: 18 mg/dL (ref 6–23)
CO2: 27 mEq/L (ref 19–32)
Calcium: 9 mg/dL (ref 8.4–10.5)
Chloride: 107 mEq/L (ref 96–112)
Creatinine, Ser: 0.79 mg/dL (ref 0.40–1.20)
GFR: 97.45 mL/min (ref >60.00)
Glucose, Bld: 69 mg/dL — ABNORMAL LOW (ref 70–99)
Potassium: 4.4 mEq/L (ref 3.5–5.1)
Sodium: 140 mEq/L (ref 135–145)
Total Bilirubin: 0.3 mg/dL (ref 0.2–1.2)
Total Protein: 7.1 g/dL (ref 6.0–8.3)

## 2015-09-08 LAB — LIPID PANEL
Cholesterol: 208 mg/dL — ABNORMAL HIGH (ref 0–200)
HDL: 66 mg/dL (ref >39.00)
LDL Cholesterol: 130 mg/dL — ABNORMAL HIGH (ref 0–99)
NonHDL: 142.38
Total CHOL/HDL Ratio: 3
Triglycerides: 62 mg/dL (ref 0.0–149.0)
VLDL: 12.4 mg/dL (ref 0.0–40.0)

## 2015-09-08 MED ORDER — PANTOPRAZOLE SODIUM 40 MG PO TBEC: 40.0000 mg | DELAYED_RELEASE_TABLET | Freq: Every day | ORAL | 3 refills | Status: DC

## 2015-09-08 MED ORDER — METOPROLOL SUCCINATE ER 50 MG PO TB24: 50.0000 mg | ORAL_TABLET | Freq: Every day | ORAL | 1 refills | Status: DC

## 2015-09-08 MED ORDER — PANTOPRAZOLE SODIUM 40 MG PO TBEC: 40.0000 mg | DELAYED_RELEASE_TABLET | Freq: Two times a day (BID) | ORAL | 3 refills | Status: DC

## 2015-09-08 NOTE — Assessment & Plan Note (Signed)
Stable con't metoprolol 

## 2015-09-08 NOTE — Patient Instructions (Signed)
Smoking Cessation, Tips for Success If you are ready to quit smoking, congratulations! You have chosen to help yourself be healthier. Cigarettes bring nicotine, tar, carbon monoxide, and other irritants into your body. Your lungs, heart, and blood vessels will be able to work better without these poisons. There are many different ways to quit smoking. Nicotine gum, nicotine patches, a nicotine inhaler, or nicotine nasal spray can help with physical craving. Hypnosis, support groups, and medicines help break the habit of smoking. WHAT THINGS CAN I DO TO MAKE QUITTING EASIER?  Here are some tips to help you quit for good:  Pick a date when you will quit smoking completely. Tell all of your friends and family about your plan to quit on that date.  Do not try to slowly cut down on the number of cigarettes you are smoking. Pick a quit date and quit smoking completely starting on that day.  Throw away all cigarettes.   Clean and remove all ashtrays from your home, work, and car.  On a card, write down your reasons for quitting. Carry the card with you and read it when you get the urge to smoke.  Cleanse your body of nicotine. Drink enough water and fluids to keep your urine clear or pale yellow. Do this after quitting to flush the nicotine from your body.  Learn to predict your moods. Do not let a bad situation be your excuse to have a cigarette. Some situations in your life might tempt you into wanting a cigarette.  Never have "just one" cigarette. It leads to wanting another and another. Remind yourself of your decision to quit.  Change habits associated with smoking. If you smoked while driving or when feeling stressed, try other activities to replace smoking. Stand up when drinking your coffee. Brush your teeth after eating. Sit in a different chair when you read the paper. Avoid alcohol while trying to quit, and try to drink fewer caffeinated beverages. Alcohol and caffeine may urge you to  smoke.  Avoid foods and drinks that can trigger a desire to smoke, such as sugary or spicy foods and alcohol.  Ask people who smoke not to smoke around you.  Have something planned to do right after eating or having a cup of coffee. For example, plan to take a walk or exercise.  Try a relaxation exercise to calm you down and decrease your stress. Remember, you may be tense and nervous for the first 2 weeks after you quit, but this will pass.  Find new activities to keep your hands busy. Play with a pen, coin, or rubber band. Doodle or draw things on paper.  Brush your teeth right after eating. This will help cut down on the craving for the taste of tobacco after meals. You can also try mouthwash.   Use oral substitutes in place of cigarettes. Try using lemon drops, carrots, cinnamon sticks, or chewing gum. Keep them handy so they are available when you have the urge to smoke.  When you have the urge to smoke, try deep breathing.  Designate your home as a nonsmoking area.  If you are a heavy smoker, ask your health care provider about a prescription for nicotine chewing gum. It can ease your withdrawal from nicotine.  Reward yourself. Set aside the cigarette money you save and buy yourself something nice.  Look for support from others. Join a support group or smoking cessation program. Ask someone at home or at work to help you with your plan   to quit smoking.  Always ask yourself, "Do I need this cigarette or is this just a reflex?" Tell yourself, "Today, I choose not to smoke," or "I do not want to smoke." You are reminding yourself of your decision to quit.  Do not replace cigarette smoking with electronic cigarettes (commonly called e-cigarettes). The safety of e-cigarettes is unknown, and some may contain harmful chemicals.  If you relapse, do not give up! Plan ahead and think about what you will do the next time you get the urge to smoke. HOW WILL I FEEL WHEN I QUIT SMOKING? You  may have symptoms of withdrawal because your body is used to nicotine (the addictive substance in cigarettes). You may crave cigarettes, be irritable, feel very hungry, cough often, get headaches, or have difficulty concentrating. The withdrawal symptoms are only temporary. They are strongest when you first quit but will go away within 10-14 days. When withdrawal symptoms occur, stay in control. Think about your reasons for quitting. Remind yourself that these are signs that your body is healing and getting used to being without cigarettes. Remember that withdrawal symptoms are easier to treat than the major diseases that smoking can cause.  Even after the withdrawal is over, expect periodic urges to smoke. However, these cravings are generally short lived and will go away whether you smoke or not. Do not smoke! WHAT RESOURCES ARE AVAILABLE TO HELP ME QUIT SMOKING? Your health care provider can direct you to community resources or hospitals for support, which may include:  Group support.  Education.  Hypnosis.  Therapy.   This information is not intended to replace advice given to you by your health care provider. Make sure you discuss any questions you have with your health care provider.   Document Released: 10/13/2003 Document Revised: 02/04/2014 Document Reviewed: 07/02/2012 Elsevier Interactive Patient Education 2016 Elsevier Inc.  

## 2015-09-08 NOTE — Assessment & Plan Note (Signed)
con't with inhaler Discussed with pt -- is cutting down slowly

## 2015-09-08 NOTE — Progress Notes (Signed)
Patient ID: Samantha [name], female    DOB: [birth year]  Age: 55 y.o. MRN: [id number]    Subjective:  Subjective  HPI Samantha [name] presents for f/u bp and cholesterol    Her stomach is better--- needs refill on protonix and bp med.   Review of Systems  Constitutional: Negative for appetite change, diaphoresis, fatigue and unexpected weight change.  Eyes: Negative for pain, redness and visual disturbance.  Respiratory: Negative for cough, chest tightness, shortness of breath and wheezing.   Cardiovascular: Negative for chest pain, palpitations and leg swelling.  Endocrine: Negative for cold intolerance, heat intolerance, polydipsia, polyphagia and polyuria.  Genitourinary: Negative for difficulty urinating, dysuria and frequency.  Neurological: Negative for dizziness, light-headedness, numbness and headaches.    History Past Medical History:  Diagnosis Date  . Altered mental status   . Backache, unspecified   . Gastric ulcer, unspecified as acute or chronic, without mention of hemorrhage, perforation, or obstruction   . Helicobacter pylori (H. pylori)   . Tobacco abuse   . Unspecified essential hypertension     She has a past surgical history that includes Upper gastrointestinal endoscopy (2013).   Her family history includes Diabetes in her sister; Hypertension in her mother.She reports that she has been smoking Cigarettes.  She has a 0.35 pack-year smoking history. She has never used smokeless tobacco. She reports that she drinks alcohol. She reports that she does not use drugs.  Current Outpatient Prescriptions on File Prior to Visit  Medication Sig Dispense Refill  . cyclobenzaprine (FLEXERIL) 10 MG tablet Take 1 tablet (10 mg total) by mouth 3 (three) times daily as needed. 30 tablet 2  . hydroxychloroquine (PLAQUENIL) 200 MG tablet Take 200 mg by mouth 2 (two) times daily.    . Immune Globulin 10% (PRIVIGEN) 10 GM/100ML SOLN Inject 1 g/kg into the vein once.    .  Multiple Vitamin (MULTIVITAMIN) tablet Take 1 tablet by mouth daily.    . mycophenolate (CELLCEPT) 500 MG tablet Take 1,500 mg by mouth 2 (two) times daily.     . NONFORMULARY OR COMPOUNDED ITEM Cane As directed 1 each 0  . oxyCODONE-acetaminophen (PERCOCET) 10-325 MG tablet Take 1 tablet by mouth every 8 (eight) hours as needed. 60 tablet 0   No current facility-administered medications on file prior to visit.      Objective:  Objective  Physical Exam  Constitutional: She is oriented to person, place, and time. She appears well-developed and well-nourished.  HENT:  Head: Normocephalic and atraumatic.  Eyes: Conjunctivae and EOM are normal.  Neck: Normal range of motion. Neck supple. No JVD present. Carotid bruit is not present. No thyromegaly present.  Cardiovascular: Normal rate, regular rhythm and normal heart sounds.   No murmur heard. Pulmonary/Chest: Effort normal and breath sounds normal. No respiratory distress. She has no wheezes. She has no rales. She exhibits no tenderness.  Musculoskeletal: She exhibits no edema.  Neurological: She is alert and oriented to person, place, and time.  Psychiatric: She has a normal mood and affect. Her behavior is normal. Judgment and thought content normal.  Nursing note and vitals reviewed.  BP 120/78 (BP Location: Left Arm, Patient Position: Sitting, Cuff Size: Small)   Pulse 78   Temp 97.7 F (36.5 C) (Oral)   Wt 162 lb 6.4 oz (73.7 kg)   LMP 04/11/2011   SpO2 97%   BMI 24.69 kg/m  Wt Readings from Last 3 Encounters:  09/08/15 162 lb 6.4 oz (73.7 kg)  08/08/15 161 lb (73 kg)  08/02/15 161 lb (73 kg)     Lab Results  Component Value Date   WBC 4.1 06/23/2015   HGB 13.6 06/23/2015   HCT 41.5 06/23/2015   PLT 184.0 06/23/2015   GLUCOSE 69 (L) 09/08/2015   CHOL 208 (H) 09/08/2015   TRIG 62.0 09/08/2015   HDL 66.00 09/08/2015   LDLDIRECT 141.8 09/15/2012   LDLCALC 130 (H) 09/08/2015   ALT 18 09/08/2015   AST 24 09/08/2015     NA 140 09/08/2015   K 4.4 09/08/2015   CL 107 09/08/2015   CREATININE 0.79 09/08/2015   BUN 18 09/08/2015   CO2 27 09/08/2015   TSH 0.83 09/15/2012   INR 1.14 02/26/2011   HGBA1C 5.6 03/13/2011    No results found.   Assessment & Plan:  Plan  I have discontinued Ms. Brannigan's pantoprazole. I have also changed her pantoprazole. Additionally, I am having her maintain her multivitamin, hydroxychloroquine, NONFORMULARY OR COMPOUNDED ITEM, mycophenolate, Immune Globulin 10%, cyclobenzaprine, oxyCODONE-acetaminophen, nicotine, and metoprolol succinate.  Meds ordered this encounter  Medications  . nicotine (NICOTROL) 10 MG inhaler    Sig: Inhale 1 continuous puffing into the lungs daily.  Marland Kitchen DISCONTD: pantoprazole (PROTONIX) 40 MG tablet    Sig: Take 1 tablet (40 mg total) by mouth 2 (two) times daily. For 14 days for H.Pylori    Dispense:  180 tablet    Refill:  3  . metoprolol succinate (TOPROL-XL) 50 MG 24 hr tablet    Sig: Take 1 tablet (50 mg total) by mouth daily. Take with or immediately following a meal.    Dispense:  90 tablet    Refill:  1  . pantoprazole (PROTONIX) 40 MG tablet    Sig: Take 1 tablet (40 mg total) by mouth daily.    Dispense:  90 tablet    Refill:  3    Problem List Items Addressed This Visit      Unprioritized   HTN (hypertension)    Stable con't metoprolol      Relevant Medications   metoprolol succinate (TOPROL-XL) 50 MG 24 hr tablet   Other Relevant Orders   Comprehensive metabolic panel (Completed)   Lipid panel (Completed)   Tobacco use disorder    con't with inhaler Discussed with pt -- is cutting down slowly      Relevant Medications   nicotine (NICOTROL) 10 MG inhaler    Other Visit Diagnoses    Hyperlipidemia    -  Primary   Relevant Medications   metoprolol succinate (TOPROL-XL) 50 MG 24 hr tablet   Other Relevant Orders   Comprehensive metabolic panel (Completed)   Lipid panel (Completed)   Gastroesophageal reflux  disease, esophagitis presence not specified       Relevant Medications   pantoprazole (PROTONIX) 40 MG tablet      Follow-up: Return in about 3 months (around 12/09/2015) for hyperlipidemia.  Samantha Held, DO

## 2015-09-08 NOTE — Progress Notes (Signed)
Pre visit review using our clinic review tool, if applicable. No additional management support is needed unless otherwise documented below in the visit note. 

## 2015-10-23 ENCOUNTER — Other Ambulatory Visit (HOSPITAL_COMMUNITY): Payer: Self-pay | Admitting: *Deleted

## 2015-10-24 ENCOUNTER — Encounter (HOSPITAL_COMMUNITY)
Admission: RE | Admit: 2015-10-24 | Discharge: 2015-10-24 | Disposition: A | Discharge status: Home / Self Care | Payer: Medicare Other | Source: Ambulatory Visit | Attending: Internal Medicine | Admitting: Internal Medicine

## 2015-10-24 DIAGNOSIS — M329 Systemic lupus erythematosus, unspecified: Secondary | ICD-10-CM | POA: Diagnosis present

## 2015-10-24 DIAGNOSIS — G629 Polyneuropathy, unspecified: Secondary | ICD-10-CM | POA: Insufficient documentation

## 2015-10-24 MED ORDER — IMMUNE GLOBULIN (HUMAN) 20 GM/200ML IJ SOLN
70.0000 g | Freq: Once | INTRAMUSCULAR | Status: DC
  Administered 2015-10-24: 70 g via INTRAVENOUS
  Filled 2015-10-24: qty 700

## 2015-10-24 MED ORDER — DEXTROSE 5 % IV SOLN
INTRAVENOUS | Status: DC
  Administered 2015-10-24: 09:00:00 via INTRAVENOUS

## 2015-10-24 MED ORDER — ACETAMINOPHEN 325 MG PO TABS
650.0000 mg | ORAL_TABLET | Freq: Once | ORAL | Status: AC
  Administered 2015-10-24: 650 mg via ORAL

## 2015-10-24 MED ORDER — ACETAMINOPHEN 325 MG PO TABS
ORAL_TABLET | ORAL | Status: AC
  Filled 2015-10-24: qty 2

## 2015-10-24 MED ORDER — DIPHENHYDRAMINE HCL 25 MG PO CAPS
ORAL_CAPSULE | ORAL | Status: AC
  Filled 2015-10-24: qty 1

## 2015-10-24 MED ORDER — DIPHENHYDRAMINE HCL 25 MG PO TABS
25.0000 mg | ORAL_TABLET | Freq: Once | ORAL | Status: AC
  Administered 2015-10-24: 25 mg via ORAL
  Filled 2015-10-24: qty 1

## 2015-12-13 ENCOUNTER — Emergency Department (HOSPITAL_COMMUNITY)
Admission: EM | Admit: 2015-12-13 | Discharge: 2015-12-13 | Disposition: A | Discharge status: Home / Self Care | Payer: Medicare Other | Attending: Emergency Medicine | Admitting: Emergency Medicine

## 2015-12-13 ENCOUNTER — Emergency Department (HOSPITAL_COMMUNITY): Payer: Medicare Other

## 2015-12-13 ENCOUNTER — Encounter (HOSPITAL_COMMUNITY): Payer: Self-pay | Admitting: Emergency Medicine

## 2015-12-13 DIAGNOSIS — R1013 Epigastric pain: Secondary | ICD-10-CM | POA: Diagnosis present

## 2015-12-13 DIAGNOSIS — I10 Essential (primary) hypertension: Secondary | ICD-10-CM | POA: Diagnosis not present

## 2015-12-13 DIAGNOSIS — N281 Cyst of kidney, acquired: Secondary | ICD-10-CM | POA: Diagnosis not present

## 2015-12-13 DIAGNOSIS — F17210 Nicotine dependence, cigarettes, uncomplicated: Secondary | ICD-10-CM | POA: Insufficient documentation

## 2015-12-13 LAB — URINALYSIS, ROUTINE W REFLEX MICROSCOPIC
Bilirubin Urine: NEGATIVE
Glucose, UA: NEGATIVE mg/dL
Hgb urine dipstick: NEGATIVE
Ketones, ur: NEGATIVE mg/dL
Leukocytes, UA: NEGATIVE
Nitrite: NEGATIVE
Protein, ur: 100 mg/dL — AB
Specific Gravity, Urine: 1.018 (ref 1.005–1.030)
pH: 6 (ref 5.0–8.0)

## 2015-12-13 LAB — URINE MICROSCOPIC-ADD ON: RBC / HPF: NONE SEEN RBC/hpf (ref 0–5)

## 2015-12-13 LAB — CBC
HCT: 44.2 % (ref 36.0–46.0)
Hemoglobin: 14.5 g/dL (ref 12.0–15.0)
MCH: 29.6 pg (ref 26.0–34.0)
MCHC: 32.8 g/dL (ref 30.0–36.0)
MCV: 90.2 fL (ref 78.0–100.0)
Platelets: 165 10*3/uL (ref 150–400)
RBC: 4.9 MIL/uL (ref 3.87–5.11)
RDW: 13.2 % (ref 11.5–15.5)
WBC: 3.4 10*3/uL — ABNORMAL LOW (ref 4.0–10.5)

## 2015-12-13 LAB — COMPREHENSIVE METABOLIC PANEL
ALT: 41 U/L (ref 14–54)
AST: 48 U/L — ABNORMAL HIGH (ref 15–41)
Albumin: 3.6 g/dL (ref 3.5–5.0)
Alkaline Phosphatase: 70 U/L (ref 38–126)
Anion gap: 9 (ref 5–15)
BUN: 9 mg/dL (ref 6–20)
CO2: 26 mmol/L (ref 22–32)
Calcium: 9.3 mg/dL (ref 8.9–10.3)
Chloride: 105 mmol/L (ref 101–111)
Creatinine, Ser: 0.74 mg/dL (ref 0.44–1.00)
GFR calc Af Amer: >60 mL/min (ref >60)
GFR calc non Af Amer: >60 mL/min (ref >60)
Glucose, Bld: 98 mg/dL (ref 65–99)
Potassium: 4.9 mmol/L (ref 3.5–5.1)
Sodium: 140 mmol/L (ref 135–145)
Total Bilirubin: 0.6 mg/dL (ref 0.3–1.2)
Total Protein: 7.4 g/dL (ref 6.5–8.1)

## 2015-12-13 LAB — I-STAT TROPONIN, ED
Troponin i, poc: 0.01 ng/mL (ref 0.00–0.08)
Troponin i, poc: 0.02 ng/mL (ref 0.00–0.08)

## 2015-12-13 LAB — POC URINE PREG, ED: Preg Test, Ur: NEGATIVE

## 2015-12-13 LAB — LIPASE, BLOOD: Lipase: 30 U/L (ref 11–51)

## 2015-12-13 LAB — I-STAT CG4 LACTIC ACID, ED
Lactic Acid, Venous: 1.03 mmol/L (ref 0.5–1.9)
Lactic Acid, Venous: 0.72 mmol/L (ref 0.5–1.9)

## 2015-12-13 MED ORDER — ONDANSETRON HCL 4 MG PO TABS: 4.0000 mg | ORAL_TABLET | Freq: Three times a day (TID) | ORAL | 0 refills | Status: DC | PRN

## 2015-12-13 MED ORDER — IOPAMIDOL (ISOVUE-300) INJECTION 61%
INTRAVENOUS | Status: AC
Start: 2015-12-13 — End: 2015-12-13
  Administered 2015-12-13: 100 mL
  Filled 2015-12-13: qty 100

## 2015-12-13 MED ORDER — ALBUTEROL SULFATE (2.5 MG/3ML) 0.083% IN NEBU
5.0000 mg | INHALATION_SOLUTION | Freq: Once | RESPIRATORY_TRACT | Status: AC
  Administered 2015-12-13: 5 mg via RESPIRATORY_TRACT
  Filled 2015-12-13: qty 6

## 2015-12-13 MED ORDER — SODIUM CHLORIDE 0.9 % IV SOLN
Freq: Once | INTRAVENOUS | Status: AC
  Administered 2015-12-13: 11:00:00 via INTRAVENOUS

## 2015-12-13 MED ORDER — MORPHINE SULFATE (PF) 4 MG/ML IV SOLN
4.0000 mg | Freq: Once | INTRAVENOUS | Status: AC
  Administered 2015-12-13: 4 mg via INTRAVENOUS
  Filled 2015-12-13: qty 1

## 2015-12-13 MED ORDER — HYDROCODONE-ACETAMINOPHEN 5-325 MG PO TABS: 1.0000 | ORAL_TABLET | ORAL | 0 refills | Status: DC | PRN

## 2015-12-13 NOTE — Discharge Instructions (Addendum)
Please follow-up with your primary care physician for further management of your pain. If symptoms return or worsen, please return the nearest ED.

## 2015-12-13 NOTE — ED Notes (Addendum)
Patient reports feeling more short of breath and having increased abdominal pain.  BP85/41.  Oxygen 92% on RA-placed on 2L via [place]

## 2015-12-13 NOTE — ED Notes (Signed)
Ambulated and assisted pt to the bathroom.  She tolerated well, but endorsed a slight unsteadiness.

## 2015-12-13 NOTE — ED Provider Notes (Signed)
[facility] DEPT Provider Note   CSN: [id number] Arrival date & time: 12/13/15  [id number]     History   Chief Complaint Chief Complaint  Patient presents with  . Abdominal Pain  . Shortness of Breath    HPI Samantha [name] is a 55 y.o. female with a past medical history significant for hypertension, lupus, prior gastric ulcer, and chronic headaches who presents with abdominal pain, chest pain, shortness of breath. Patient reports that for the last 2 days, she has had worsening intermittent severe abdominal pain. She says that she had similar pain several weeks ago however she says it worsened over the last 2 days. She describes it as a "50 out of 10" at times. It is currently 7 out of 10. It is an epigastric type pain that is "like somebody is constantly punching me". Patient says she felt her abdomen was hard during the episodes of severe pain. She denies any episodes of nausea, vomiting, constipation, or diarrhea. She had a normal bowel movement yesterday with no bleeding. She does denies any urinary symptoms including no dysuria, frequency, hesitation, change in smell, or change in appearance. She denies any fevers, chills, chest pain, but does report the name and shortness of breath. She also reports that today she has had a worsened cough with a productive yellow sputum. She reports it was dry on the preceding day. He denies any leg pain, leg swelling, or any other symptoms. She reports that she took an oxycodone without significant relief.  Patient reports that the abdominal pain radiated into her chest earlier but that radiation has resolved.      The history is provided by the patient, medical records, a friend and a relative.  Abdominal Pain   This is a new problem. The current episode started 2 days ago. The problem occurs constantly. The problem has been gradually worsening. The pain is located in the epigastric region and chest. The quality of the pain is sharp, pressure-like  and cramping. The pain is at a severity of 10/10. The pain is severe. Pertinent negatives include anorexia, fever, diarrhea, nausea, vomiting, constipation, dysuria, frequency, hematuria and headaches. The symptoms are aggravated by palpation. Nothing relieves the symptoms. Her past medical history is significant for PUD.    Past Medical History:  Diagnosis Date  . Altered mental status   . Backache, unspecified   . Gastric ulcer, unspecified as acute or chronic, without mention of hemorrhage, perforation, or obstruction   . Helicobacter pylori (H. pylori)   . Tobacco abuse   . Unspecified essential hypertension     Patient Active Problem List   Diagnosis Date Noted  . HTN (hypertension) 06/23/2015  . Abdominal pain, epigastric 06/23/2015  . Polypharmacy 06/30/2012  . Tobacco use disorder 11/22/2011  . Achilles tendinitis 11/20/2011  . Lupus (systemic lupus erythematosus) ([place]) 08/22/2011  . Disseminated lupus erythematosus ([place]) 08/21/2011  . 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 GBS (Guillain Barre syndrome) ([place]) 04/09/2011  . Headache(784.0) 03/09/2011  . Back pain 03/09/2011  . Hyponatremia 03/09/2011  . Hypokalemia 03/09/2011    Past Surgical History:  Procedure Laterality Date  . UPPER GASTROINTESTINAL ENDOSCOPY  2013    OB History    No data available       Home Medications    Prior to Admission medications   Medication Sig Start Date End Date Taking? Authorizing Provider  cyclobenzaprine (FLEXERIL) 10 MG tablet Take 1 tablet (10 mg total) by mouth 3 (three) times daily  as needed. 07/25/14   Rosalita Chessman Chase, DO  hydroxychloroquine (PLAQUENIL) 200 MG tablet Take 200 mg by mouth 2 (two) times daily.    Historical Provider, MD  Immune Globulin 10% (PRIVIGEN) 10 GM/100ML SOLN Inject 1 g/kg into the vein once.    Historical Provider, MD  metoprolol succinate (TOPROL-XL) 50 MG 24 hr tablet Take 1 tablet (50 mg total) by mouth daily. Take with  or immediately following a meal. 09/08/15   Rosalita Chessman Chase, DO  Multiple Vitamin (MULTIVITAMIN) tablet Take 1 tablet by mouth daily.    Historical Provider, MD  mycophenolate (CELLCEPT) 500 MG tablet Take 1,500 mg by mouth 2 (two) times daily.     Historical Provider, MD  nicotine (NICOTROL) 10 MG inhaler Inhale 1 continuous puffing into the lungs daily. 11/22/11   Historical Provider, MD  NONFORMULARY OR COMPOUNDED ITEM Cane As directed 08/22/11   Rosalita Chessman Chase, DO  oxyCODONE-acetaminophen (PERCOCET) 10-325 MG tablet Take 1 tablet by mouth every 8 (eight) hours as needed. 06/27/15   Alferd Apa Lowne Chase, DO  pantoprazole (PROTONIX) 40 MG tablet Take 1 tablet (40 mg total) by mouth daily. 09/08/15   [name], DO    Family History Family History  Problem Relation Age of Onset  . Diabetes Sister   . Hypertension Mother   . Colon cancer Neg Hx     Social History Social History  Substance Use Topics  . Smoking status: Current Every Day Smoker    Packs/day: 0.01    Years: 35.00    Types: Cigarettes  . Smokeless tobacco: Never Used     Comment: 3-5 cig a day  . Alcohol use Yes     Comment: occassional; beer      Allergies   Tramadol   Review of Systems Review of Systems  Constitutional: Negative for activity change, chills, diaphoresis, fatigue and fever.  HENT: Negative for congestion and rhinorrhea.   Eyes: Negative for visual disturbance.  Respiratory: Negative for cough, choking, chest tightness, shortness of breath and stridor.   Cardiovascular: Negative for chest pain, palpitations and leg swelling.  Gastrointestinal: Positive for abdominal pain. Negative for abdominal distention, anorexia, constipation, diarrhea, nausea and vomiting.  Genitourinary: Negative for difficulty urinating, dysuria, flank pain, frequency, hematuria, menstrual problem, pelvic pain, vaginal bleeding and vaginal discharge.  Musculoskeletal: Negative for back pain and neck  pain.  Skin: Negative for rash and wound.  Neurological: Negative for dizziness, weakness, light-headedness, numbness and headaches.  Psychiatric/Behavioral: Negative for agitation and confusion.  All other systems reviewed and are negative.    Physical Exam Updated Vital Signs BP (!) 178/103 (BP Location: Right Arm)   Pulse 73   Temp 98 F (36.7 C) (Oral)   Resp 16   Ht 5\' 8"  (1.727 m)   Wt 164 lb (74.4 kg)   LMP 04/11/2011   SpO2 97%   BMI 24.94 kg/m   Physical Exam  Constitutional: She is oriented to person, place, and time. She appears well-developed and well-nourished. No distress.  HENT:  Head: Normocephalic and atraumatic.  Mouth/Throat: Oropharynx is clear and moist. No oropharyngeal exudate.  Eyes: Conjunctivae and EOM are normal. Pupils are equal, round, and reactive to light.  Neck: Normal range of motion. Neck supple.  Cardiovascular: Normal rate and regular rhythm.   No murmur heard. Pulmonary/Chest: Effort normal and breath sounds normal. No stridor. No respiratory distress. She has no wheezes. She has no rales. She exhibits no tenderness.  Abdominal: Soft. Normal appearance and bowel sounds are normal. She exhibits no distension. There is tenderness in the epigastric area. There is no guarding.    Musculoskeletal: She exhibits no edema or tenderness.  Neurological: She is alert and oriented to person, place, and time. She displays normal reflexes. No sensory deficit. She exhibits normal muscle tone. Coordination normal.  Skin: Skin is warm and dry. Capillary refill takes less than 2 seconds. No rash noted.  Psychiatric: She has a normal mood and affect.  Nursing note and vitals reviewed.    ED Treatments / Results  Labs (all labs ordered are listed, but only abnormal results are displayed) Labs Reviewed  COMPREHENSIVE METABOLIC PANEL - Abnormal; Notable for the following:       Result Value   AST 48 (*)    All other components within normal limits    CBC - Abnormal; Notable for the following:    WBC 3.4 (*)    All other components within normal limits  URINALYSIS, ROUTINE W REFLEX MICROSCOPIC (NOT AT [facility] - Abnormal; Notable for the following:    Protein, ur 100 (*)    All other components within normal limits  URINE MICROSCOPIC-ADD ON - Abnormal; Notable for the following:    Squamous Epithelial / LPF 0-5 (*)    Bacteria, UA FEW (*)    Casts HYALINE CASTS (*)    All other components within normal limits  LIPASE, BLOOD  I-STAT CG4 LACTIC ACID, ED  I-STAT TROPOININ, ED  POC URINE PREG, ED  I-STAT CG4 LACTIC ACID, ED  I-STAT TROPOININ, ED    EKG  EKG Interpretation  Date/Time:  Wednesday December 13 2015 10:10:10 EST Ventricular Rate:  77 PR Interval:    QRS Duration: 94 QT Interval:  426 QTC Calculation: 483 R Axis:   59 Text Interpretation:  Sinus rhythm LAE, consider biatrial enlargement RSR' in V1 or V2, probably normal variant Left ventricular hypertrophy When compared to prior, ECG appears similar.  No STEMI Confirmed by Sherry Ruffing MD, [place] [phone]) on 12/13/2015 10:18:46 AM       Radiology Dg Chest 2 View  Result Date: 12/13/2015 CLINICAL DATA:  Shortness of breath for 2 days.  History of smoking. EXAM: CHEST  2 VIEW COMPARISON:  06/01/2014 FINDINGS: Both lungs are clear. Heart and mediastinum are within normal limits. Trachea is midline. No pleural effusions. No acute bone abnormality. IMPRESSION: No active cardiopulmonary disease. Electronically Signed   By: Markus Daft M.D.   On: 12/13/2015 09:23   Ct Abdomen Pelvis W Contrast  Result Date: 12/13/2015 CLINICAL DATA:  Diffuse abdominal pain and distention beginning today. EXAM: CT ABDOMEN AND PELVIS WITH CONTRAST TECHNIQUE: Multidetector CT imaging of the abdomen and pelvis was performed using the standard protocol following bolus administration of intravenous contrast. CONTRAST:  100 ml ISOVUE-300 IOPAMIDOL (ISOVUE-300) INJECTION 61% COMPARISON:  CT abdomen  and pelvis 03/08/2011. FINDINGS: Lower chest: Clear.  No pleural or pericardial effusion. Hepatobiliary: Negative. Pancreas: Negative. Spleen: Negative. Adrenals/Urinary Tract: A simple right renal cyst measures 8 cm in diameter compared to 5.3 cm on the prior examination. Otherwise unremarkable. Stomach/Bowel: There is mild sigmoid diverticulosis. The colon is otherwise normal appearance. The stomach, small bowel and appendix appear normal. Vascular/Lymphatic: Aortic atherosclerosis. No enlarged abdominal or pelvic lymph nodes. Reproductive: 4.1 cm uterine fibroid is identified as on the prior study. Degenerative calcifications are not seen within the fibroid. Otherwise unremarkable. Other: No fluid collection Musculoskeletal: Loss of disc space height and vacuum disc phenomenon are  seen at L5-S1. The facet degenerative change causes 0.4 cm anterolisthesis L4 on L5. IMPRESSION: No acute abnormality abdomen or pelvis. No finding to explain the patient's symptoms. Aortoiliac atherosclerosis. Mild sigmoid diverticulosis without diverticulitis. Increase in the size of a simple right renal cyst from 5.3 cm in diameter to 8.0 cm. Lower lumbar spondylosis. Electronically Signed   By: Inge Rise M.D.   On: 12/13/2015 11:44    Procedures Procedures (including critical care time)  Medications Ordered in ED Medications  albuterol (PROVENTIL) (2.5 MG/3ML) 0.083% nebulizer solution 5 mg (5 mg Nebulization Given 12/13/15 0930)  morphine 4 MG/ML injection 4 mg (4 mg Intravenous Given 12/13/15 0954)  iopamidol (ISOVUE-300) 61 % injection (100 mLs  Contrast Given 12/13/15 1125)  0.9 %  sodium chloride infusion ( Intravenous Stopped 12/13/15 1327)     Initial Impression / Assessment and Plan / ED Course  I have reviewed the triage vital signs and the nursing notes.  Pertinent labs & imaging results that were available during my care of the patient were reviewed by me and considered in my medical decision  making (see chart for details).  Clinical Course     Samantha [name] is a 55 y.o. female with a past medical history significant for hypertension, lupus, prior gastric ulcer, and chronic headaches who presents with abdominal pain, chest pain, shortness of breath.  History and exam are seen above.  On exam, patient has tenderness in her epigastric and upper abdominal areas. No lower abdominal tenderness. No CVA tenderness. Lungs are clear. Chest is nontender. Legs show no edema or unilateral swelling. Neuro exam intact.  Patient will have laboratory testing, EKG, chest x-ray, and due to severity of abdominal pain, patient will have CT of the abdomen and pelvis. Patient will be given pain medicine and fluids during workup.  Diagnostic workup showed normal lactic acid, negative troponin times two, no evidence of UTI on urinalysis, CMP only remarkable for mild elevation in AST, and no evidence of anemia or leukocytosis on CBC. Lipase nonelevated.  CT scan showed no acute cause of patient symptoms. Specifically, no cholecystitis, appendicitis, pancreatitis, obstruction, abscess, or evidence of ischemia. Chest x-ray showed no pneumonia. CT scan and show evidence of enlarged renal cysts. Patient informed the finding.  Given patient's resolution and symptoms and reassuring workup, patient felt appropriate for discharge. Patient will be given pain medicine nausea medicine. Strict return precautions were given despite reassuring workup. Patient understood plans to follow up with PCP as well as return if needed. Patient given pain medicine nausea medicine. Patient and family understood and agreed with plan of care. Patient discharged in good condition.    Final Clinical Impressions(s) / ED Diagnoses   Final diagnoses:  Epigastric pain    New Prescriptions Discharge Medication List as of 12/13/2015  3:03 PM    START taking these medications   Details  HYDROcodone-acetaminophen (NORCO/VICODIN)  5-325 MG tablet Take 1 tablet by mouth every 4 (four) hours as needed., Starting Wed 12/13/2015, Print    ondansetron (ZOFRAN) 4 MG tablet Take 1 tablet (4 mg total) by mouth every 8 (eight) hours as needed for nausea or vomiting., Starting Wed 12/13/2015, Print        Clinical Impression: 1. Epigastric pain     Disposition: Discharge  Condition: Good  I have discussed the results, Dx and Tx plan with the pt(& family if present). He/she/they expressed understanding and agree(s) with the plan. Discharge instructions discussed at great length. Strict return precautions discussed  and pt &/or family have verbalized understanding of the instructions. No further questions at time of discharge.    Discharge Medication List as of 12/13/2015  3:03 PM    START taking these medications   Details  HYDROcodone-acetaminophen (NORCO/VICODIN) 5-325 MG tablet Take 1 tablet by mouth every 4 (four) hours as needed., Starting Wed 12/13/2015, Print    ondansetron (ZOFRAN) 4 MG tablet Take 1 tablet (4 mg total) by mouth every 8 (eight) hours as needed for nausea or vomiting., Starting Wed 12/13/2015, Print        Follow Up: [name], DO [address] [place] [address] [phone]  Schedule an appointment as soon as possible for a visit    [facility] [address] [id number] [place] [phone]  If symptoms worsen     Courtney Paris, MD 12/13/15 [phone]

## 2015-12-13 NOTE — ED Notes (Signed)
EKG given to Dr Tegeler. 

## 2015-12-13 NOTE — ED Notes (Signed)
Patient transported to CT 

## 2015-12-13 NOTE — ED Triage Notes (Addendum)
Per EMS patient has epigastric pain x 2 days with shortness of breath this morning and reports productive cough with yellow sputum.  Denies N/V/D.

## 2015-12-13 NOTE — ED Notes (Signed)
Patient given water and crackers for PO challenge.

## 2015-12-19 ENCOUNTER — Encounter (HOSPITAL_COMMUNITY)
Admission: RE | Admit: 2015-12-19 | Discharge: 2015-12-19 | Disposition: A | Discharge status: Home / Self Care | Payer: Medicare Other | Source: Ambulatory Visit | Attending: Internal Medicine | Admitting: Internal Medicine

## 2015-12-19 DIAGNOSIS — G629 Polyneuropathy, unspecified: Secondary | ICD-10-CM | POA: Diagnosis not present

## 2015-12-19 DIAGNOSIS — M329 Systemic lupus erythematosus, unspecified: Secondary | ICD-10-CM | POA: Diagnosis present

## 2015-12-19 MED ORDER — ACETAMINOPHEN 325 MG PO TABS
ORAL_TABLET | ORAL | Status: AC
  Filled 2015-12-19: qty 2

## 2015-12-19 MED ORDER — DIPHENHYDRAMINE HCL 25 MG PO TABS
25.0000 mg | ORAL_TABLET | Freq: Once | ORAL | Status: AC
  Administered 2015-12-19: 25 mg via ORAL
  Filled 2015-12-19: qty 1

## 2015-12-19 MED ORDER — IMMUNE GLOBULIN (HUMAN) 20 GM/200ML IJ SOLN
70.0000 g | Freq: Once | INTRAMUSCULAR | Status: AC
  Administered 2015-12-19: 70 g via INTRAVENOUS
  Filled 2015-12-19: qty 700

## 2015-12-19 MED ORDER — DEXTROSE 5 % IV SOLN
Freq: Once | INTRAVENOUS | Status: AC
  Administered 2015-12-19: 09:00:00 via INTRAVENOUS

## 2015-12-19 MED ORDER — DIPHENHYDRAMINE HCL 25 MG PO CAPS
ORAL_CAPSULE | ORAL | Status: AC
  Filled 2015-12-19: qty 1

## 2015-12-19 MED ORDER — ACETAMINOPHEN 325 MG PO TABS
650.0000 mg | ORAL_TABLET | Freq: Once | ORAL | Status: AC
  Administered 2015-12-19: 650 mg via ORAL

## 2015-12-25 ENCOUNTER — Other Ambulatory Visit: Payer: Self-pay | Admitting: Family Medicine

## 2015-12-25 ENCOUNTER — Encounter: Payer: Self-pay | Admitting: Family Medicine

## 2015-12-25 ENCOUNTER — Ambulatory Visit (INDEPENDENT_AMBULATORY_CARE_PROVIDER_SITE_OTHER): Payer: Medicare Other | Admitting: Family Medicine

## 2015-12-25 ENCOUNTER — Other Ambulatory Visit (HOSPITAL_COMMUNITY)
Admission: RE | Admit: 2015-12-25 | Discharge: 2015-12-25 | Disposition: A | Discharge status: Home / Self Care | Payer: Medicare Other | Source: Ambulatory Visit | Attending: Family Medicine | Admitting: Family Medicine

## 2015-12-25 VITALS — BP 162/92 | HR 73 | Temp 98.0°F | Wt 165.8 lb

## 2015-12-25 DIAGNOSIS — Z124 Encounter for screening for malignant neoplasm of cervix: Secondary | ICD-10-CM | POA: Diagnosis not present

## 2015-12-25 DIAGNOSIS — Z1151 Encounter for screening for human papillomavirus (HPV): Secondary | ICD-10-CM | POA: Insufficient documentation

## 2015-12-25 DIAGNOSIS — Z1231 Encounter for screening mammogram for malignant neoplasm of breast: Secondary | ICD-10-CM

## 2015-12-25 DIAGNOSIS — I10 Essential (primary) hypertension: Secondary | ICD-10-CM

## 2015-12-25 DIAGNOSIS — Z113 Encounter for screening for infections with a predominantly sexual mode of transmission: Secondary | ICD-10-CM | POA: Insufficient documentation

## 2015-12-25 DIAGNOSIS — Z0000 Encounter for general adult medical examination without abnormal findings: Secondary | ICD-10-CM

## 2015-12-25 DIAGNOSIS — G43809 Other migraine, not intractable, without status migrainosus: Secondary | ICD-10-CM | POA: Diagnosis not present

## 2015-12-25 DIAGNOSIS — Z01419 Encounter for gynecological examination (general) (routine) without abnormal findings: Secondary | ICD-10-CM | POA: Insufficient documentation

## 2015-12-25 DIAGNOSIS — Z1239 Encounter for other screening for malignant neoplasm of breast: Secondary | ICD-10-CM

## 2015-12-25 DIAGNOSIS — N6489 Other specified disorders of breast: Secondary | ICD-10-CM

## 2015-12-25 LAB — POCT URINALYSIS DIPSTICK
Bilirubin, UA: NEGATIVE
Blood, UA: NEGATIVE
Glucose, UA: NEGATIVE
Ketones, UA: NEGATIVE
Leukocytes, UA: NEGATIVE
Nitrite, UA: NEGATIVE
Protein, UA: NEGATIVE
Spec Grav, UA: >=1.03
Urobilinogen, UA: 0.2
pH, UA: 5.5

## 2015-12-25 LAB — COMPREHENSIVE METABOLIC PANEL
ALT: 16 U/L (ref 0–35)
AST: 23 U/L (ref 0–37)
Albumin: 3.9 g/dL (ref 3.5–5.2)
Alkaline Phosphatase: 47 U/L (ref 39–117)
BUN: 14 mg/dL (ref 6–23)
CO2: 26 mEq/L (ref 19–32)
Calcium: 9.1 mg/dL (ref 8.4–10.5)
Chloride: 106 mEq/L (ref 96–112)
Creatinine, Ser: 0.67 mg/dL (ref 0.40–1.20)
GFR: 117.73 mL/min (ref >60.00)
Glucose, Bld: 90 mg/dL (ref 70–99)
Potassium: 4.3 mEq/L (ref 3.5–5.1)
Sodium: 137 mEq/L (ref 135–145)
Total Bilirubin: 0.2 mg/dL (ref 0.2–1.2)
Total Protein: 8.1 g/dL (ref 6.0–8.3)

## 2015-12-25 LAB — LIPID PANEL
Cholesterol: 195 mg/dL (ref 0–200)
HDL: 52.5 mg/dL (ref >39.00)
LDL Cholesterol: 125 mg/dL — ABNORMAL HIGH (ref 0–99)
NonHDL: 142.23
Total CHOL/HDL Ratio: 4
Triglycerides: 86 mg/dL (ref 0.0–149.0)
VLDL: 17.2 mg/dL (ref 0.0–40.0)

## 2015-12-25 LAB — CBC WITH DIFFERENTIAL/PLATELET
Basophils Absolute: 0 10*3/uL (ref 0.0–0.1)
Basophils Relative: 0.8 % (ref 0.0–3.0)
Eosinophils Absolute: 0.1 10*3/uL (ref 0.0–0.7)
Eosinophils Relative: 2.4 % (ref 0.0–5.0)
HCT: 40.6 % (ref 36.0–46.0)
Hemoglobin: 13.6 g/dL (ref 12.0–15.0)
Lymphocytes Relative: 43.5 % (ref 12.0–46.0)
Lymphs Abs: 1.6 10*3/uL (ref 0.7–4.0)
MCHC: 33.5 g/dL (ref 30.0–36.0)
MCV: 87.2 fl (ref 78.0–100.0)
Monocytes Absolute: 0.4 10*3/uL (ref 0.1–1.0)
Monocytes Relative: 10.2 % (ref 3.0–12.0)
Neutro Abs: 1.6 10*3/uL (ref 1.4–7.7)
Neutrophils Relative %: 43.1 % (ref 43.0–77.0)
Platelets: 211 10*3/uL (ref 150.0–400.0)
RBC: 4.65 Mil/uL (ref 3.87–5.11)
RDW: 13.7 % (ref 11.5–15.5)
WBC: 3.8 10*3/uL — ABNORMAL LOW (ref 4.0–10.5)

## 2015-12-25 MED ORDER — METOPROLOL SUCCINATE ER 100 MG PO TB24: 100.0000 mg | ORAL_TABLET | Freq: Every day | ORAL | 1 refills | Status: DC

## 2015-12-25 NOTE — Progress Notes (Signed)
Pre visit review using our clinic review tool, if applicable. No additional management support is needed unless otherwise documented below in the visit note. 

## 2015-12-25 NOTE — Patient Instructions (Signed)

## 2015-12-25 NOTE — Progress Notes (Signed)
Subjective:     Samantha [name] is a 55 y.o. female and is here for a comprehensive physical exam. The patient reports problems - high bp-- no symptoms.-- pt was seen in er for epigastric pain which resolved -- it comes back occasionally but not as bad as it was the day she went to er .  Pt having headaches daily and bp has been elevated.     Social History   Social History  . Marital status: Single    Spouse name: N/A  . Number of children: N/A  . Years of education: N/A   Occupational History  . permanant disability [employer]   Social History Main Topics  . Smoking status: Current Every Day Smoker    Packs/day: 0.01    Years: 35.00    Types: Cigarettes  . Smokeless tobacco: Never Used     Comment: 3-5 cig a day  . Alcohol use Yes     Comment: occassional; beer   . Drug use: No  . Sexual activity: Not Currently    Partners: Male   Other Topics Concern  . Not on file   Social History Narrative   Exercise--- walking   Health Maintenance  Topic Date Due  . PAP SMEAR  09/16/2015  . MAMMOGRAM  08/29/2016  . COLONOSCOPY  03/14/2017  . TETANUS/TDAP  09/16/2022  . Hepatitis C Screening  Completed  . HIV Screening  Completed    The following portions of the patient's history were reviewed and updated as appropriate: She  has a past medical history of Altered mental status; Backache, unspecified; Gastric ulcer, unspecified as acute or chronic, without mention of hemorrhage, perforation, or obstruction; Helicobacter pylori (H. pylori); Tobacco abuse; and Unspecified essential hypertension. She  does not have any pertinent problems on file. She  has a past surgical history that includes Upper gastrointestinal endoscopy (2013). Her family history includes Diabetes in her sister; Hypertension in her mother. She  reports that she has been smoking Cigarettes.  She has a 0.35 pack-year smoking history. She has never used smokeless tobacco. She reports that she drinks alcohol. She  reports that she does not use drugs. She has a current medication list which includes the following prescription(s): cyclobenzaprine, hydrocodone-acetaminophen, hydroxychloroquine, metoprolol succinate, multivitamin, mycophenolate, NONFORMULARY OR COMPOUNDED ITEM, ondansetron, oxycodone-acetaminophen, and pantoprazole. Current Outpatient Prescriptions on File Prior to Visit  Medication Sig Dispense Refill  . cyclobenzaprine (FLEXERIL) 10 MG tablet Take 1 tablet (10 mg total) by mouth 3 (three) times daily as needed. 30 tablet 2  . HYDROcodone-acetaminophen (NORCO/VICODIN) 5-325 MG tablet Take 1 tablet by mouth every 4 (four) hours as needed. 16 tablet 0  . hydroxychloroquine (PLAQUENIL) 200 MG tablet Take 200 mg by mouth 2 (two) times daily.    . metoprolol succinate (TOPROL-XL) 50 MG 24 hr tablet Take 1 tablet (50 mg total) by mouth daily. Take with or immediately following a meal. 90 tablet 1  . Multiple Vitamin (MULTIVITAMIN) tablet Take 1 tablet by mouth daily.    . mycophenolate (CELLCEPT) 500 MG tablet Take 1,500 mg by mouth 2 (two) times daily.     . NONFORMULARY OR COMPOUNDED ITEM Kasandra Knudsen As directed (Patient not taking: Reported on 12/13/2015) 1 each 0  . ondansetron (ZOFRAN) 4 MG tablet Take 1 tablet (4 mg total) by mouth every 8 (eight) hours as needed for nausea or vomiting. 20 tablet 0  . oxyCODONE-acetaminophen (PERCOCET) 10-325 MG tablet Take 1 tablet by mouth every 8 (eight) hours as needed.  60 tablet 0  . pantoprazole (PROTONIX) 40 MG tablet Take 1 tablet (40 mg total) by mouth daily. 90 tablet 3   No current facility-administered medications on file prior to visit.    She is allergic to tramadol..  Review of Systems Review of Systems  Constitutional: Negative for activity change, appetite change and fatigue.  HENT: Negative for hearing loss, congestion, tinnitus and ear discharge.  dentist q60m Eyes: Negative for visual disturbance (see optho q1y -- vision corrected to 20/20  with glasses).  Respiratory: Negative for cough, chest tightness and shortness of breath.   Cardiovascular: Negative for chest pain, palpitations and leg swelling.  Gastrointestinal: Negative for abdominal pain, diarrhea, constipation and abdominal distention.  Genitourinary: Negative for urgency, frequency, decreased urine volume and difficulty urinating.  Musculoskeletal: Negative for back pain, arthralgias and gait problem.  Skin: Negative for color change, pallor and rash.  Neurological: Negative for dizziness, light-headedness, numbness +daily headaches Hematological: Negative for adenopathy. Does not bruise/bleed easily.  Psychiatric/Behavioral: Negative for suicidal ideas, confusion, sleep disturbance, self-injury, dysphoric mood, decreased concentration and agitation.       Objective:    BP (!) 162/92 (BP Location: Left Arm, Patient Position: Sitting, Cuff Size: Normal)   Pulse 73   Temp 98 F (36.7 C) (Oral)   Wt 165 lb 12.8 oz (75.2 kg)   LMP 04/11/2011   SpO2 98%   BMI 25.21 kg/m  General appearance: alert, cooperative, appears stated age and no distress Head: Normocephalic, without obvious abnormality, atraumatic Eyes: conjunctivae/corneas clear. PERRL, EOM's intact. Fundi benign. Ears: normal TM's and external ear canals both ears Nose: Nares normal. Septum midline. Mucosa normal. No drainage or sinus tenderness. Throat: lips, mucosa, and tongue normal; teeth and gums normal Neck: no adenopathy, no carotid bruit, no JVD, supple, symmetrical, trachea midline and thyroid not enlarged, symmetric, no tenderness/mass/nodules Back: symmetric, no curvature. ROM normal. No CVA tenderness. Lungs: clear to auscultation bilaterally Breasts: normal appearance, no masses or tenderness Heart: regular rate and rhythm, S1, S2 normal, no murmur, click, rub or gallop Abdomen: soft, non-tender; bowel sounds normal; no masses,  no organomegaly Pelvic: cervix normal in appearance,  external genitalia normal, no adnexal masses or tenderness, no cervical motion tenderness, rectovaginal septum normal, uterus normal size, shape, and consistency, vagina normal without discharge and rectal heme neg brown stool Extremities: cervix normal in appearance, external genitalia normal, no adnexal masses or tenderness, no cervical motion tenderness, rectovaginal septum normal, uterus normal size, shape, and consistency, vagina normal without discharge and pap done, rectal --heme neg brown stool Pulses: 2+ and symmetric Skin: Skin color, texture, turgor normal. No rashes or lesions Lymph nodes: Cervical, supraclavicular, and axillary nodes normal. Neurologic: Alert and oriented X 3, normal strength and tone. Normal symmetric reflexes. Normal coordination and gait    Assessment:    Healthy female exam.   Plan:     See After Visit Summary for Counseling Recommendations    Subjective:   Samantha [name] is a 55 y.o. female who presents for Medicare Annual (Subsequent) preventive examination.  Review of Systems:   Review of Systems  Constitutional: Negative for activity change, appetite change and fatigue.  HENT: Negative for hearing loss, congestion, tinnitus and ear discharge.   Eyes: Negative for visual disturbance (see optho q1y -- vision corrected to 20/20 with glasses).  Respiratory: Negative for cough, chest tightness and shortness of breath.   Cardiovascular: Negative for chest pain, palpitations and leg swelling.  Gastrointestinal: Negative for abdominal pain, diarrhea, constipation  and abdominal distention.  Genitourinary: Negative for urgency, frequency, decreased urine volume and difficulty urinating.  Musculoskeletal: Negative for back pain, arthralgias and gait problem.  Skin: Negative for color change, pallor and rash.  Neurological: Negative for dizziness, light-headedness, numbness and headaches.  Hematological: Negative for adenopathy. Does not bruise/bleed  easily.  Psychiatric/Behavioral: Negative for suicidal ideas, confusion, sleep disturbance, self-injury, dysphoric mood, decreased concentration and agitation.  Pt is able to read and write and can do all ADLs No risk for falling No abuse/ violence in home         Objective:     Vitals: BP (!) 162/92 (BP Location: Left Arm, Patient Position: Sitting, Cuff Size: Normal)   Pulse 73   Temp 98 F (36.7 C) (Oral)   Wt 165 lb 12.8 oz (75.2 kg)   LMP 04/11/2011   SpO2 98%   BMI 25.21 kg/m   Body mass index is 25.21 kg/m. BP (!) 162/92 (BP Location: Left Arm, Patient Position: Sitting, Cuff Size: Normal)   Pulse 73   Temp 98 F (36.7 C) (Oral)   Wt 165 lb 12.8 oz (75.2 kg)   LMP 04/11/2011   SpO2 98%   BMI 25.21 kg/m  General appearance: alert, cooperative, appears stated age and no distress Head: Normocephalic, without obvious abnormality, atraumatic Eyes: conjunctivae/corneas clear. PERRL, EOM's intact. Fundi benign. Ears: normal TM's and external ear canals both ears Nose: Nares normal. Septum midline. Mucosa normal. No drainage or sinus tenderness. Throat: lips, mucosa, and tongue normal; teeth and gums normal Neck: no adenopathy, no carotid bruit, no JVD, supple, symmetrical, trachea midline and thyroid not enlarged, symmetric, no tenderness/mass/nodules Back: symmetric, no curvature. ROM normal. No CVA tenderness. Lungs: clear to auscultation bilaterally Breasts: normal appearance, no masses or tenderness Heart: regular rate and rhythm, S1, S2 normal, no murmur, click, rub or gallop Abdomen: soft, non-tender; bowel sounds normal; no masses,  no organomegaly Pelvic: cervix normal in appearance, external genitalia normal, no adnexal masses or tenderness, no cervical motion tenderness, rectovaginal septum normal, uterus normal size, shape, and consistency, vagina normal without discharge and pap done, rectal heme neg brown stool Extremities: extremities normal, atraumatic, no  cyanosis or edema Pulses: 2+ and symmetric Skin: Skin color, texture, turgor normal. No rashes or lesions Lymph nodes: Cervical, supraclavicular, and axillary nodes normal. Neurologic: Alert and oriented X 3, normal strength and tone. Normal symmetric reflexes. Normal coordination and gait  Tobacco History  Smoking Status  . Current Every Day Smoker  . Packs/day: 0.01  . Years: 35.00  . Types: Cigarettes  Smokeless Tobacco  . Never Used    Comment: 3-5 cig a day     Ready to quit: No Counseling given: Yes   Past Medical History:  Diagnosis Date  . Altered mental status   . Backache, unspecified   . Gastric ulcer, unspecified as acute or chronic, without mention of hemorrhage, perforation, or obstruction   . Helicobacter pylori (H. pylori)   . Tobacco abuse   . Unspecified essential hypertension    Past Surgical History:  Procedure Laterality Date  . UPPER GASTROINTESTINAL ENDOSCOPY  2013   Family History  Problem Relation Age of Onset  . Diabetes Sister   . Hypertension Mother   . Colon cancer Neg Hx    History  Sexual Activity  . Sexual activity: Not Currently  . Partners: Male    Outpatient Encounter Prescriptions as of 12/25/2015  Medication Sig  . cyclobenzaprine (FLEXERIL) 10 MG tablet Take 1 tablet (10  mg total) by mouth 3 (three) times daily as needed.  Marland Kitchen HYDROcodone-acetaminophen (NORCO/VICODIN) 5-325 MG tablet Take 1 tablet by mouth every 4 (four) hours as needed.  . hydroxychloroquine (PLAQUENIL) 200 MG tablet Take 200 mg by mouth 2 (two) times daily.  . metoprolol succinate (TOPROL-XL) 100 MG 24 hr tablet Take 1 tablet (100 mg total) by mouth daily. Take with or immediately following a meal.  . Multiple Vitamin (MULTIVITAMIN) tablet Take 1 tablet by mouth daily.  . mycophenolate (CELLCEPT) 500 MG tablet Take 1,500 mg by mouth 2 (two) times daily.   . NONFORMULARY OR COMPOUNDED ITEM Kasandra Knudsen As directed (Patient not taking: Reported on 12/13/2015)  .  ondansetron (ZOFRAN) 4 MG tablet Take 1 tablet (4 mg total) by mouth every 8 (eight) hours as needed for nausea or vomiting.  Marland Kitchen oxyCODONE-acetaminophen (PERCOCET) 10-325 MG tablet Take 1 tablet by mouth every 8 (eight) hours as needed.  . pantoprazole (PROTONIX) 40 MG tablet Take 1 tablet (40 mg total) by mouth daily.  . [DISCONTINUED] metoprolol succinate (TOPROL-XL) 50 MG 24 hr tablet Take 1 tablet (50 mg total) by mouth daily. Take with or immediately following a meal.   No facility-administered encounter medications on file as of 12/25/2015.     Activities of Daily Living In your present state of health, do you have any difficulty performing the following activities: 10/24/2015 04/11/2015  Hearing? N N  Vision? N N  Difficulty concentrating or making decisions? N N  Walking or climbing stairs? N N  Dressing or bathing? N N  Some recent data might be hidden    Patient Care Team: [name], DO as PCP - General (Family Medicine) Candee Furbish, MD as Referring Physician (Rheumatology) Inda Castle, MD as Consulting Physician (Gastroenterology) Melissa Noon, OD as Referring Physician (Optometry)    Assessment:    cpe Exercise Activities and Dietary recommendations    Goals    . Quit smoking / using tobacco      Fall Risk Fall Risk  07/25/2014  Falls in the past year? No   Depression Screen PHQ 2/9 Scores 07/25/2014 09/15/2012  PHQ - 2 Score 0 0     Cognitive Function        Immunization History  Administered Date(s) Administered  . Tdap 09/15/2012   Screening Tests Health Maintenance  Topic Date Due  . PAP SMEAR  09/16/2015  . MAMMOGRAM  08/29/2016  . COLONOSCOPY  03/14/2017  . TETANUS/TDAP  09/16/2022  . Hepatitis C Screening  Completed  . HIV Screening  Completed      Plan:    During the course of the visit the patient was educated and counseled about the following appropriate screening and preventive services:   Vaccines to include  Pneumoccal, Influenza, Hepatitis B, Td, Zostavax, HCV  Electrocardiogram  Cardiovascular Disease  Colorectal cancer screening  Bone density screening  Diabetes screening  Glaucoma screening  Mammography/PAP  Nutrition counseling   Patient Instructions (the written plan) was given to the patient.  1. Essential hypertension Stable  - metoprolol succinate (TOPROL-XL) 100 MG 24 hr tablet; Take 1 tablet (100 mg total) by mouth daily. Take with or immediately following a meal.  Dispense: 30 tablet; Refill: 1 - Comprehensive metabolic panel - Lipid panel - CBC with Differential/Platelet - POCT urinalysis dipstick  2. Other migraine without status migrainosus, not intractable   - MR Brain Wo Contrast; Future  3. Preventative health care   - Cytology - PAP  4. Breast  cancer screening   - MM DIGITAL SCREENING BILATERAL; Future  5. Encounter for Medicare annual wellness exam    6. Papanicolaou smear for cervical cancer screening   - Cytology - PAP  [name], DO  12/25/2015

## 2015-12-27 LAB — CYTOLOGY - PAP
Chlamydia: NEGATIVE
Diagnosis: NEGATIVE
HPV: NOT DETECTED
Neisseria Gonorrhea: NEGATIVE
Trichomonas: NEGATIVE

## 2015-12-27 LAB — CERVICOVAGINAL ANCILLARY ONLY
Bacterial vaginitis: NEGATIVE
Candida vaginitis: NEGATIVE

## 2016-01-01 ENCOUNTER — Ambulatory Visit: Payer: Medicare Other | Admitting: Family Medicine

## 2016-01-01 ENCOUNTER — Ambulatory Visit
Admission: RE | Admit: 2016-01-01 | Discharge: 2016-01-01 | Disposition: A | Discharge status: Home / Self Care | Payer: Medicare Other | Source: Ambulatory Visit | Attending: Family Medicine | Admitting: Family Medicine

## 2016-01-01 DIAGNOSIS — N6489 Other specified disorders of breast: Secondary | ICD-10-CM

## 2016-01-08 ENCOUNTER — Encounter: Payer: Self-pay | Admitting: Family Medicine

## 2016-01-08 ENCOUNTER — Ambulatory Visit (INDEPENDENT_AMBULATORY_CARE_PROVIDER_SITE_OTHER): Payer: Medicare Other | Admitting: Family Medicine

## 2016-01-08 VITALS — BP 184/105 | HR 68 | Temp 98.4°F | Ht 68.0 in | Wt 165.8 lb

## 2016-01-08 DIAGNOSIS — I10 Essential (primary) hypertension: Secondary | ICD-10-CM | POA: Diagnosis not present

## 2016-01-08 DIAGNOSIS — F17210 Nicotine dependence, cigarettes, uncomplicated: Secondary | ICD-10-CM

## 2016-01-08 MED ORDER — LOSARTAN POTASSIUM 50 MG PO TABS: 50.0000 mg | ORAL_TABLET | Freq: Every day | ORAL | 3 refills | Status: DC

## 2016-01-08 NOTE — Assessment & Plan Note (Signed)
Uncontrolled con't metoprolol and add cozaar rto 2 weeks

## 2016-01-08 NOTE — Progress Notes (Signed)
Pre visit review using our clinic tool,if applicable. No additional management support is needed unless otherwise documented below in the visit note.  

## 2016-01-08 NOTE — Progress Notes (Signed)
Subjective:    Patient ID: Samantha [name], female    DOB: [birth year], 55 y.o.   MRN: [id number]  Chief Complaint  Patient presents with  . Follow-up    HPI Patient is in today for bp running high.  No headache, blurry vision, chest pain , sob or palpitations.    Past Medical History:  Diagnosis Date  . Altered mental status   . Backache, unspecified   . Gastric ulcer, unspecified as acute or chronic, without mention of hemorrhage, perforation, or obstruction   . Helicobacter pylori (H. pylori)   . Tobacco abuse   . Unspecified essential hypertension     Past Surgical History:  Procedure Laterality Date  . UPPER GASTROINTESTINAL ENDOSCOPY  2013    Family History  Problem Relation Age of Onset  . Diabetes Sister   . Hypertension Mother   . Colon cancer Neg Hx     Social History   Social History  . Marital status: Single    Spouse name: N/A  . Number of children: N/A  . Years of education: N/A   Occupational History  . permanant disability [employer]   Social History Main Topics  . Smoking status: Current Every Day Smoker    Packs/day: 0.01    Years: 35.00    Types: Cigarettes  . Smokeless tobacco: Never Used     Comment: 3-5 cig a day  . Alcohol use Yes     Comment: occassional; beer   . Drug use: No  . Sexual activity: Not Currently    Partners: Male   Other Topics Concern  . Not on file   Social History Narrative   Exercise--- walking    Outpatient Medications Prior to Visit  Medication Sig Dispense Refill  . cyclobenzaprine (FLEXERIL) 10 MG tablet Take 1 tablet (10 mg total) by mouth 3 (three) times daily as needed. 30 tablet 2  . hydroxychloroquine (PLAQUENIL) 200 MG tablet Take 200 mg by mouth 2 (two) times daily.    . metoprolol succinate (TOPROL-XL) 100 MG 24 hr tablet Take 1 tablet (100 mg total) by mouth daily. Take with or immediately following a meal. 30 tablet 1  . Multiple Vitamin (MULTIVITAMIN) tablet Take 1 tablet by mouth  daily.    . mycophenolate (CELLCEPT) 500 MG tablet Take 1,500 mg by mouth 2 (two) times daily.     . NONFORMULARY OR COMPOUNDED ITEM Cane As directed 1 each 0  . ondansetron (ZOFRAN) 4 MG tablet Take 1 tablet (4 mg total) by mouth every 8 (eight) hours as needed for nausea or vomiting. 20 tablet 0  . oxyCODONE-acetaminophen (PERCOCET) 10-325 MG tablet Take 1 tablet by mouth every 8 (eight) hours as needed. 60 tablet 0  . pantoprazole (PROTONIX) 40 MG tablet Take 1 tablet (40 mg total) by mouth daily. 90 tablet 3  . HYDROcodone-acetaminophen (NORCO/VICODIN) 5-325 MG tablet Take 1 tablet by mouth every 4 (four) hours as needed. (Patient not taking: Reported on 01/08/2016) 16 tablet 0   No facility-administered medications prior to visit.     Allergies  Allergen Reactions  . Tramadol Nausea Only    Feels weirded out    Review of Systems  Constitutional: Negative for chills, fever and malaise/fatigue.  HENT: Negative for congestion and hearing loss.   Eyes: Negative for discharge.  Respiratory: Negative for cough, sputum production and shortness of breath.   Cardiovascular: Negative for chest pain, palpitations and leg swelling.  Gastrointestinal: Negative for abdominal pain, blood in stool,  constipation, diarrhea, heartburn, nausea and vomiting.  Genitourinary: Negative for dysuria, frequency, hematuria and urgency.  Musculoskeletal: Negative for back pain, falls and myalgias.  Skin: Negative for rash.  Neurological: Negative for dizziness, sensory change, loss of consciousness, weakness and headaches.  Endo/Heme/Allergies: Negative for environmental allergies. Does not bruise/bleed easily.  Psychiatric/Behavioral: Negative for depression and suicidal ideas. The patient is not nervous/anxious and does not have insomnia.        Objective:    Physical Exam  Constitutional: She is oriented to person, place, and time. She appears well-developed and well-nourished.  HENT:  Head:  Normocephalic and atraumatic.  Eyes: Conjunctivae and EOM are normal.  Neck: Normal range of motion. Neck supple. No JVD present. Carotid bruit is not present. No thyromegaly present.  Cardiovascular: Normal rate, regular rhythm and normal heart sounds.   No murmur heard. Pulmonary/Chest: Effort normal and breath sounds normal. No respiratory distress. She has no wheezes. She has no rales. She exhibits no tenderness.  Musculoskeletal: She exhibits no edema.  Neurological: She is alert and oriented to person, place, and time.  Psychiatric: She has a normal mood and affect. Her behavior is normal. Judgment and thought content normal.  Nursing note and vitals reviewed.   BP (!) 184/105   Pulse 68   Temp 98.4 F (36.9 C) (Oral)   Ht 5\' 8"  (1.727 m)   Wt 165 lb 12.8 oz (75.2 kg)   LMP 04/11/2011   SpO2 100%   BMI 25.21 kg/m  Wt Readings from Last 3 Encounters:  01/08/16 165 lb 12.8 oz (75.2 kg)  12/25/15 165 lb 12.8 oz (75.2 kg)  12/19/15 164 lb (74.4 kg)     Lab Results  Component Value Date   WBC 3.8 (L) 12/25/2015   HGB 13.6 12/25/2015   HCT 40.6 12/25/2015   PLT 211.0 12/25/2015   GLUCOSE 90 12/25/2015   CHOL 195 12/25/2015   TRIG 86.0 12/25/2015   HDL 52.50 12/25/2015   LDLDIRECT 141.8 09/15/2012   LDLCALC 125 (H) 12/25/2015   ALT 16 12/25/2015   AST 23 12/25/2015   NA 137 12/25/2015   K 4.3 12/25/2015   CL 106 12/25/2015   CREATININE 0.67 12/25/2015   BUN 14 12/25/2015   CO2 26 12/25/2015   TSH 0.83 09/15/2012   INR 1.14 02/26/2011   HGBA1C 5.6 03/13/2011    Lab Results  Component Value Date   TSH 0.83 09/15/2012   Lab Results  Component Value Date   WBC 3.8 (L) 12/25/2015   HGB 13.6 12/25/2015   HCT 40.6 12/25/2015   MCV 87.2 12/25/2015   PLT 211.0 12/25/2015   Lab Results  Component Value Date   NA 137 12/25/2015   K 4.3 12/25/2015   CO2 26 12/25/2015   GLUCOSE 90 12/25/2015   BUN 14 12/25/2015   CREATININE 0.67 12/25/2015   BILITOT 0.2  12/25/2015   ALKPHOS 47 12/25/2015   AST 23 12/25/2015   ALT 16 12/25/2015   PROT 8.1 12/25/2015   ALBUMIN 3.9 12/25/2015   CALCIUM 9.1 12/25/2015   ANIONGAP 9 12/13/2015   GFR 117.73 12/25/2015   Lab Results  Component Value Date   CHOL 195 12/25/2015   Lab Results  Component Value Date   HDL 52.50 12/25/2015   Lab Results  Component Value Date   LDLCALC 125 (H) 12/25/2015   Lab Results  Component Value Date   TRIG 86.0 12/25/2015   Lab Results  Component Value Date   CHOLHDL 4  12/25/2015   Lab Results  Component Value Date   HGBA1C 5.6 03/13/2011       Assessment & Plan:   Problem List Items Addressed This Visit      Unprioritized   HTN (hypertension) - Primary    Uncontrolled con't metoprolol and add cozaar rto 2 weeks      Relevant Medications   losartan (COZAAR) 50 MG tablet    Other Visit Diagnoses    Heavy cigarette smoker (20-39 per day)       Relevant Orders   Ambulatory Referral for Lung Cancer Scre      I am having Ms. Mossberg start on losartan. I am also having her maintain her multivitamin, hydroxychloroquine, NONFORMULARY OR COMPOUNDED ITEM, mycophenolate, cyclobenzaprine, oxyCODONE-acetaminophen, pantoprazole, HYDROcodone-acetaminophen, ondansetron, and metoprolol succinate.  Meds ordered this encounter  Medications  . losartan (COZAAR) 50 MG tablet    Sig: Take 1 tablet (50 mg total) by mouth daily.    Dispense:  90 tablet    Refill:  3     [name], DO

## 2016-01-08 NOTE — Patient Instructions (Signed)
Hypertension Hypertension, commonly called high blood pressure, is when the force of blood pumping through your arteries is too strong. Your arteries are the blood vessels that carry blood from your heart throughout your body. A blood pressure reading consists of a higher number over a lower number, such as 110/72. The higher number (systolic) is the pressure inside your arteries when your heart pumps. The lower number (diastolic) is the pressure inside your arteries when your heart relaxes. Ideally you want your blood pressure below 120/80. Hypertension forces your heart to work harder to pump blood. Your arteries may become narrow or stiff. Having untreated or uncontrolled hypertension can cause heart attack, stroke, kidney disease, and other problems. What increases the risk? Some risk factors for high blood pressure are controllable. Others are not. Risk factors you cannot control include:  Race. You may be at higher risk if you are African American.  Age. Risk increases with age.  Gender. Men are at higher risk than women before age 45 years. After age 65, women are at higher risk than men. Risk factors you can control include:  Not getting enough exercise or physical activity.  Being overweight.  Getting too much fat, sugar, calories, or salt in your diet.  Drinking too much alcohol. What are the signs or symptoms? Hypertension does not usually cause signs or symptoms. Extremely high blood pressure (hypertensive crisis) may cause headache, anxiety, shortness of breath, and nosebleed. How is this diagnosed? To check if you have hypertension, your health care provider will measure your blood pressure while you are seated, with your arm held at the level of your heart. It should be measured at least twice using the same arm. Certain conditions can cause a difference in blood pressure between your right and left arms. A blood pressure reading that is higher than normal on one occasion does  not mean that you need treatment. If it is not clear whether you have high blood pressure, you may be asked to return on a different day to have your blood pressure checked again. Or, you may be asked to monitor your blood pressure at home for 1 or more weeks. How is this treated? Treating high blood pressure includes making lifestyle changes and possibly taking medicine. Living a healthy lifestyle can help lower high blood pressure. You may need to change some of your habits. Lifestyle changes may include:  Following the DASH diet. This diet is high in fruits, vegetables, and whole grains. It is low in salt, red meat, and added sugars.  Keep your sodium intake below 2,300 mg per day.  Getting at least 30-45 minutes of aerobic exercise at least 4 times per week.  Losing weight if necessary.  Not smoking.  Limiting alcoholic beverages.  Learning ways to reduce stress. Your health care provider may prescribe medicine if lifestyle changes are not enough to get your blood pressure under control, and if one of the following is true:  You are 18-59 years of age and your systolic blood pressure is above 140.  You are 60 years of age or older, and your systolic blood pressure is above 150.  Your diastolic blood pressure is above 90.  You have diabetes, and your systolic blood pressure is over 140 or your diastolic blood pressure is over 90.  You have kidney disease and your blood pressure is above 140/90.  You have heart disease and your blood pressure is above 140/90. Your personal target blood pressure may vary depending on your medical   conditions, your age, and other factors. Follow these instructions at home:  Have your blood pressure rechecked as directed by your health care provider.  Take medicines only as directed by your health care provider. Follow the directions carefully. Blood pressure medicines must be taken as prescribed. The medicine does not work as well when you skip  doses. Skipping doses also puts you at risk for problems.  Do not smoke.  Monitor your blood pressure at home as directed by your health care provider. Contact a health care provider if:  You think you are having a reaction to medicines taken.  You have recurrent headaches or feel dizzy.  You have swelling in your ankles.  You have trouble with your vision. Get help right away if:  You develop a severe headache or confusion.  You have unusual weakness, numbness, or feel faint.  You have severe chest or abdominal pain.  You vomit repeatedly.  You have trouble breathing. This information is not intended to replace advice given to you by your health care provider. Make sure you discuss any questions you have with your health care provider. Document Released: 01/14/2005 Document Revised: 06/22/2015 Document Reviewed: 11/06/2012 Elsevier Interactive Patient Education  2017 [employer].   Steps to Quit Smoking Smoking tobacco can be harmful to your health and can affect almost every organ in your body. Smoking puts you, and those around you, at risk for developing many serious chronic diseases. Quitting smoking is difficult, but it is one of the best things that you can do for your health. It is never too late to quit. What are the benefits of quitting smoking? When you quit smoking, you lower your risk of developing serious diseases and conditions, such as:  Lung cancer or lung disease, such as COPD.  Heart disease.  Stroke.  Heart attack.  Infertility.  Osteoporosis and bone fractures. Additionally, symptoms such as coughing, wheezing, and shortness of breath may get better when you quit. You may also find that you get sick less often because your body is stronger at fighting off colds and infections. If you are pregnant, quitting smoking can help to reduce your chances of having a baby of low birth weight. How do I get ready to quit? When you decide to quit smoking,  create a plan to make sure that you are successful. Before you quit:  Pick a date to quit. Set a date within the next two weeks to give you time to prepare.  Write down the reasons why you are quitting. Keep this list in places where you will see it often, such as on your bathroom mirror or in your car or wallet.  Identify the people, places, things, and activities that make you want to smoke (triggers) and avoid them. Make sure to take these actions:  Throw away all cigarettes at home, at work, and in your car.  Throw away smoking accessories, such as Scientist, research (medical).  Clean your car and make sure to empty the ashtray.  Clean your home, including curtains and carpets.  Tell your family, friends, and coworkers that you are quitting. Support from your loved ones can make quitting easier.  Talk with your health care provider about your options for quitting smoking.  Find out what treatment options are covered by your health insurance. What strategies can I use to quit smoking? Talk with your healthcare provider about different strategies to quit smoking. Some strategies include:  Quitting smoking altogether instead of gradually lessening how much  you smoke over a period of time. Research shows that quitting "cold [place]" is more successful than gradually quitting.  Attending in-person counseling to help you build problem-solving skills. You are more likely to have success in quitting if you attend several counseling sessions. Even short sessions of 10 minutes can be effective.  Finding resources and support systems that can help you to quit smoking and remain smoke-free after you quit. These resources are most helpful when you use them often. They can include:  Online chats with a Social worker.  Telephone quitlines.  Printed Furniture conservator/restorer.  Support groups or group counseling.  Text messaging programs.  Mobile phone applications.  Taking medicines to help you quit  smoking. (If you are pregnant or breastfeeding, talk with your health care provider first.) Some medicines contain nicotine and some do not. Both types of medicines help with cravings, but the medicines that include nicotine help to relieve withdrawal symptoms. Your health care provider may recommend:  Nicotine patches, gum, or lozenges.  Nicotine inhalers or sprays.  Non-nicotine medicine that is taken by mouth. Talk with your health care provider about combining strategies, such as taking medicines while you are also receiving in-person counseling. Using these two strategies together makes you more likely to succeed in quitting than if you used either strategy on its own. If you are pregnant or breastfeeding, talk with your health care provider about finding counseling or other support strategies to quit smoking. Do not take medicine to help you quit smoking unless told to do so by your health care provider. What things can I do to make it easier to quit? Quitting smoking might feel overwhelming at first, but there is a lot that you can do to make it easier. Take these important actions:  Reach out to your family and friends and ask that they support and encourage you during this time. Call telephone quitlines, reach out to support groups, or work with a counselor for support.  Ask people who smoke to avoid smoking around you.  Avoid places that trigger you to smoke, such as bars, parties, or smoke-break areas at work.  Spend time around people who do not smoke.  Lessen stress in your life, because stress can be a smoking trigger for some people. To lessen stress, try:  Exercising regularly.  Deep-breathing exercises.  Yoga.  Meditating.  Performing a body scan. This involves closing your eyes, scanning your body from head to toe, and noticing which parts of your body are particularly tense. Purposefully relax the muscles in those areas.  Download or purchase mobile phone or tablet  apps (applications) that can help you stick to your quit plan by providing reminders, tips, and encouragement. There are many free apps, such as QuitGuide from the [employer] Office manager for Disease Control and Prevention). You can find other support for quitting smoking (smoking cessation) through smokefree.gov and other websites. How will I feel when I quit smoking? Within the first 24 hours of quitting smoking, you may start to feel some withdrawal symptoms. These symptoms are usually most noticeable 2-3 days after quitting, but they usually do not last beyond 2-3 weeks. Changes or symptoms that you might experience include:  Mood swings.  Restlessness, anxiety, or irritation.  Difficulty concentrating.  Dizziness.  Strong cravings for sugary foods in addition to nicotine.  Mild weight gain.  Constipation.  Nausea.  Coughing or a sore throat.  Changes in how your medicines work in your body.  A depressed mood.  Difficulty sleeping (  insomnia). After the first 2-3 weeks of quitting, you may start to notice more positive results, such as:  Improved sense of smell and taste.  Decreased coughing and sore throat.  Slower heart rate.  Lower blood pressure.  Clearer skin.  The ability to breathe more easily.  Fewer sick days. Quitting smoking is very challenging for most people. Do not get discouraged if you are not successful the first time. Some people need to make many attempts to quit before they achieve long-term success. Do your best to stick to your quit plan, and talk with your health care provider if you have any questions or concerns. This information is not intended to replace advice given to you by your health care provider. Make sure you discuss any questions you have with your health care provider. Document Released: 01/08/2001 Document Revised: 09/12/2015 Document Reviewed: 05/31/2014 Elsevier Interactive Patient Education  2017 [employer].

## 2016-01-24 ENCOUNTER — Other Ambulatory Visit: Payer: Self-pay | Admitting: Family Medicine

## 2016-01-24 DIAGNOSIS — I10 Essential (primary) hypertension: Secondary | ICD-10-CM

## 2016-01-26 ENCOUNTER — Encounter: Payer: Self-pay | Admitting: Family Medicine

## 2016-01-26 ENCOUNTER — Ambulatory Visit (INDEPENDENT_AMBULATORY_CARE_PROVIDER_SITE_OTHER): Payer: Medicare Other | Admitting: Family Medicine

## 2016-01-26 DIAGNOSIS — I10 Essential (primary) hypertension: Secondary | ICD-10-CM

## 2016-01-26 DIAGNOSIS — F17200 Nicotine dependence, unspecified, uncomplicated: Secondary | ICD-10-CM | POA: Diagnosis not present

## 2016-01-26 MED ORDER — LOSARTAN POTASSIUM 50 MG PO TABS: 50.0000 mg | ORAL_TABLET | Freq: Every day | ORAL | 3 refills | Status: DC

## 2016-01-26 MED ORDER — METOPROLOL SUCCINATE ER 100 MG PO TB24: ORAL_TABLET | ORAL | 0 refills | Status: DC

## 2016-01-26 NOTE — Assessment & Plan Note (Signed)
Stable con't meds con't to check at home

## 2016-01-26 NOTE — Assessment & Plan Note (Signed)
Pt down to 3-5 cig/ day pulm consult for ct screen pending

## 2016-01-26 NOTE — Progress Notes (Signed)
Pre visit review using our clinic review tool, if applicable. No additional management support is needed unless otherwise documented below in the visit note. 

## 2016-01-26 NOTE — Progress Notes (Signed)
Patient ID: Samantha [name], female    DOB: 06-Nov-1960  Age: 55 y.o. MRN: [id number]    Subjective:  Subjective  HPI Samantha [name] presents for bp check   No other complaints.    Review of Systems  Constitutional: Negative for appetite change, diaphoresis, fatigue and unexpected weight change.  Eyes: Negative for pain, redness and visual disturbance.  Respiratory: Negative for cough, chest tightness, shortness of breath and wheezing.   Cardiovascular: Negative for chest pain, palpitations and leg swelling.  Endocrine: Negative for cold intolerance, heat intolerance, polydipsia, polyphagia and polyuria.  Genitourinary: Negative for difficulty urinating, dysuria and frequency.  Neurological: Negative for dizziness, light-headedness, numbness and headaches.    History Past Medical History:  Diagnosis Date  . Altered mental status   . Backache, unspecified   . Gastric ulcer, unspecified as acute or chronic, without mention of hemorrhage, perforation, or obstruction   . Helicobacter pylori (H. pylori)   . Tobacco abuse   . Unspecified essential hypertension     She has a past surgical history that includes Upper gastrointestinal endoscopy (2013).   Her family history includes Diabetes in her sister; Hypertension in her mother.She reports that she has been smoking Cigarettes.  She has a 0.35 pack-year smoking history. She has never used smokeless tobacco. She reports that she drinks alcohol. She reports that she does not use drugs.  Current Outpatient Prescriptions on File Prior to Visit  Medication Sig Dispense Refill  . cyclobenzaprine (FLEXERIL) 10 MG tablet Take 1 tablet (10 mg total) by mouth 3 (three) times daily as needed. 30 tablet 2  . HYDROcodone-acetaminophen (NORCO/VICODIN) 5-325 MG tablet Take 1 tablet by mouth every 4 (four) hours as needed. 16 tablet 0  . hydroxychloroquine (PLAQUENIL) 200 MG tablet Take 200 mg by mouth 2 (two) times daily.    . Multiple Vitamin  (MULTIVITAMIN) tablet Take 1 tablet by mouth daily.    . mycophenolate (CELLCEPT) 500 MG tablet Take 1,500 mg by mouth 2 (two) times daily.     . NONFORMULARY OR COMPOUNDED ITEM Cane As directed 1 each 0  . ondansetron (ZOFRAN) 4 MG tablet Take 1 tablet (4 mg total) by mouth every 8 (eight) hours as needed for nausea or vomiting. 20 tablet 0  . oxyCODONE-acetaminophen (PERCOCET) 10-325 MG tablet Take 1 tablet by mouth every 8 (eight) hours as needed. 60 tablet 0  . pantoprazole (PROTONIX) 40 MG tablet Take 1 tablet (40 mg total) by mouth daily. 90 tablet 3   No current facility-administered medications on file prior to visit.      Objective:  Objective  Physical Exam  Constitutional: She is oriented to person, place, and time. She appears well-developed and well-nourished.  HENT:  Head: Normocephalic and atraumatic.  Eyes: Conjunctivae and EOM are normal.  Neck: Normal range of motion. Neck supple. No JVD present. Carotid bruit is not present. No thyromegaly present.  Cardiovascular: Normal rate, regular rhythm and normal heart sounds.   No murmur heard. Pulmonary/Chest: Effort normal and breath sounds normal. No respiratory distress. She has no wheezes. She has no rales. She exhibits no tenderness.  Musculoskeletal: She exhibits no edema.  Neurological: She is alert and oriented to person, place, and time.  Psychiatric: She has a normal mood and affect.  Nursing note and vitals reviewed.  BP 102/62 (BP Location: Left Arm, Patient Position: Sitting, Cuff Size: Normal)   Pulse 70   Temp 98 F (36.7 C) (Oral)   Resp 16   Ht  5\' 8"  (1.727 m)   Wt 166 lb 12.8 oz (75.7 kg)   LMP 04/11/2011   SpO2 98%   BMI 25.36 kg/m  Wt Readings from Last 3 Encounters:  01/26/16 166 lb 12.8 oz (75.7 kg)  01/08/16 165 lb 12.8 oz (75.2 kg)  12/25/15 165 lb 12.8 oz (75.2 kg)     Lab Results  Component Value Date   WBC 3.8 (L) 12/25/2015   HGB 13.6 12/25/2015   HCT 40.6 12/25/2015   PLT 211.0  12/25/2015   GLUCOSE 90 12/25/2015   CHOL 195 12/25/2015   TRIG 86.0 12/25/2015   HDL 52.50 12/25/2015   LDLDIRECT 141.8 09/15/2012   LDLCALC 125 (H) 12/25/2015   ALT 16 12/25/2015   AST 23 12/25/2015   NA 137 12/25/2015   K 4.3 12/25/2015   CL 106 12/25/2015   CREATININE 0.67 12/25/2015   BUN 14 12/25/2015   CO2 26 12/25/2015   TSH 0.83 09/15/2012   INR 1.14 02/26/2011   HGBA1C 5.6 03/13/2011    Mm Diag Breast Tomo Bilateral  Result Date: 01/01/2016 CLINICAL DATA:  Short-term interval follow-up of a probable benign finding in the left breast. EXAM: 2D DIGITAL DIAGNOSTIC BILATERAL MAMMOGRAM WITH CAD AND ADJUNCT TOMO COMPARISON:  Previous exam(s). ACR Breast Density Category b: There are scattered areas of fibroglandular density. FINDINGS: Asymmetric fibroglandular tissue in the lateral aspect of the left breast a stable since 08/16/2013. It has a benign appearance. No suspicious mass or malignant type microcalcifications identified in either breast. Mammographic images were processed with CAD. IMPRESSION: No evidence of malignancy in either breast. RECOMMENDATION: Bilateral screening mammogram in 1 year is recommended. I have discussed the findings and recommendations with the patient. Results were also provided in writing at the conclusion of the visit. If applicable, a reminder letter will be sent to the patient regarding the next appointment. BI-RADS CATEGORY  1: Negative. Electronically Signed   By: Lillia Mountain M.D.   On: 01/01/2016 09:08     Assessment & Plan:  Plan  I am having Samantha [name] maintain her multivitamin, hydroxychloroquine, NONFORMULARY OR COMPOUNDED ITEM, mycophenolate, cyclobenzaprine, oxyCODONE-acetaminophen, pantoprazole, HYDROcodone-acetaminophen, ondansetron, losartan, and metoprolol succinate.  Meds ordered this encounter  Medications  . losartan (COZAAR) 50 MG tablet    Sig: Take 1 tablet (50 mg total) by mouth daily.    Dispense:  90 tablet    Refill:  3    . metoprolol succinate (TOPROL-XL) 100 MG 24 hr tablet    Sig: TAKE 1 TABLET BY MOUTH DAILY WITH OR IMMEDIATELY FOLLOWING A MEAL    Dispense:  90 tablet    Refill:  0    **Patient requests 90 days supply**    Problem List Items Addressed This Visit      Unprioritized   HTN (hypertension)    Stable con't meds con't to check at home      Relevant Medications   losartan (COZAAR) 50 MG tablet   metoprolol succinate (TOPROL-XL) 100 MG 24 hr tablet   Tobacco use disorder    Pt down to 3-5 cig/ day pulm consult for ct screen pending         Follow-up: Return in about 6 months (around 07/26/2016) for annual exam, fasting.  [name], DO

## 2016-01-26 NOTE — Patient Instructions (Signed)
Smoking Hazards  Smoking cigarettes is extremely bad for your health. Tobacco smoke has over 200 known poisons in it. It contains the poisonous gases nitrogen oxide and carbon monoxide. There are over 60 chemicals in tobacco smoke that cause cancer. Some of the chemicals found in cigarette smoke include:    Cyanide.    Benzene.    Formaldehyde.    Methanol (wood alcohol).    Acetylene (fuel used in welding torches).    Ammonia.   Even smoking lightly shortens your life expectancy by several years. You can greatly reduce the risk of medical problems for you and your family by stopping now. Smoking is the most preventable cause of death and disease in our society. Within days of quitting smoking, your circulation improves, you decrease the risk of having a heart attack, and your lung capacity improves. There may be some increased phlegm in the first few days after quitting, and it may take months for your lungs to clear up completely. Quitting for 10 years reduces your risk of developing lung cancer to almost that of a nonsmoker.   WHAT ARE THE RISKS OF SMOKING?  Cigarette smokers have an increased risk of many serious medical problems, including:   Lung cancer.    Lung disease (such as pneumonia, bronchitis, and emphysema).    Heart attack and chest pain due to the heart not getting enough oxygen (angina).    Heart disease and peripheral blood vessel disease.    Hypertension.    Stroke.    Oral cancer (cancer of the lip, mouth, or voice box).    Bladder cancer.    Pancreatic cancer.    Cervical cancer.    Pregnancy complications, including premature birth.    Stillbirths and smaller newborn babies, birth defects, and genetic damage to sperm.    Early menopause.    Lower estrogen level for women.    Infertility.    Facial wrinkles.    Blindness.    Increased risk of broken bones (fractures).    Senile dementia.    Stomach ulcers and internal bleeding.    Delayed  wound healing and increased risk of complications during surgery.  Because of secondhand smoke exposure, children of smokers have an increased risk of the following:    Sudden infant death syndrome (SIDS).    Respiratory infections.    Lung cancer.    Heart disease.    Ear infections.   WHY IS SMOKING ADDICTIVE?  Nicotine is the chemical agent in tobacco that is capable of causing addiction or dependence. When you smoke and inhale, nicotine is absorbed rapidly into the bloodstream through your lungs. Both inhaled and noninhaled nicotine may be addictive.   WHAT ARE THE BENEFITS OF QUITTING?   There are many health benefits to quitting smoking. Some are:    The likelihood of developing cancer and heart disease decreases. Health improvements are seen almost immediately.    Blood pressure, pulse rate, and breathing patterns start returning to normal soon after quitting.    People who quit may see an improvement in their overall quality of life.   HOW DO YOU QUIT SMOKING?  Smoking is an addiction with both physical and psychological effects, and longtime habits can be hard to change. Your health care provider can recommend:   Programs and community resources, which may include group support, education, or therapy.   Replacement products, such as patches, gum, and nasal sprays. Use these products only as directed. Do not replace cigarette smoking with   electronic cigarettes (commonly called e-cigarettes). The safety of e-cigarettes is unknown, and some may contain harmful chemicals.  FOR MORE INFORMATION   American Lung Association: www.lung.org   American Cancer Society: www.cancer.org     This information is not intended to replace advice given to you by your health care provider. Make sure you discuss any questions you have with your health care provider.     Document Released: 02/22/2004 Document Revised: 05/08/2015 Document Reviewed: 07/06/2012  Elsevier Interactive Patient Education 2017  [employer]

## 2016-02-14 ENCOUNTER — Encounter (HOSPITAL_COMMUNITY): Payer: Medicare Other

## 2016-02-20 ENCOUNTER — Ambulatory Visit (HOSPITAL_COMMUNITY)
Admission: RE | Admit: 2016-02-20 | Discharge: 2016-02-20 | Disposition: A | Discharge status: Home / Self Care | Payer: Medicare Other | Source: Ambulatory Visit | Attending: Internal Medicine | Admitting: Internal Medicine

## 2016-02-20 DIAGNOSIS — G609 Hereditary and idiopathic neuropathy, unspecified: Secondary | ICD-10-CM | POA: Diagnosis not present

## 2016-02-20 DIAGNOSIS — M329 Systemic lupus erythematosus, unspecified: Secondary | ICD-10-CM | POA: Insufficient documentation

## 2016-02-20 MED ORDER — IMMUNE GLOBULIN (HUMAN) 20 GM/200ML IJ SOLN
70.0000 g | Freq: Once | INTRAMUSCULAR | Status: AC
  Administered 2016-02-20: 70 g via INTRAVENOUS
  Filled 2016-02-20: qty 700

## 2016-02-20 MED ORDER — IMMUNE GLOBULIN (HUMAN) 10 GM/100ML IV SOLN: 70.0000 g | Freq: Once | INTRAVENOUS | Status: DC

## 2016-02-20 MED ORDER — DEXTROSE 5 % IV SOLN
Freq: Once | INTRAVENOUS | Status: AC
  Administered 2016-02-20: 10 mL via INTRAVENOUS

## 2016-02-20 MED ORDER — DIPHENHYDRAMINE HCL 25 MG PO CAPS
ORAL_CAPSULE | ORAL | Status: AC
  Filled 2016-02-20: qty 1

## 2016-02-20 MED ORDER — ACETAMINOPHEN 325 MG PO TABS
ORAL_TABLET | ORAL | Status: AC
  Filled 2016-02-20: qty 2

## 2016-02-20 MED ORDER — ACETAMINOPHEN 325 MG PO TABS
650.0000 mg | ORAL_TABLET | Freq: Once | ORAL | Status: AC
  Administered 2016-02-20: 650 mg via ORAL

## 2016-02-20 MED ORDER — DIPHENHYDRAMINE HCL 25 MG PO CAPS
25.0000 mg | ORAL_CAPSULE | Freq: Once | ORAL | Status: AC
  Administered 2016-02-20: 25 mg via ORAL

## 2016-04-15 ENCOUNTER — Other Ambulatory Visit (HOSPITAL_COMMUNITY): Payer: Self-pay | Admitting: *Deleted

## 2016-04-16 ENCOUNTER — Ambulatory Visit (HOSPITAL_COMMUNITY)
Admission: RE | Admit: 2016-04-16 | Discharge: 2016-04-16 | Disposition: A | Discharge status: Home / Self Care | Payer: Medicare Other | Source: Ambulatory Visit | Attending: Internal Medicine | Admitting: Internal Medicine

## 2016-04-16 DIAGNOSIS — M329 Systemic lupus erythematosus, unspecified: Secondary | ICD-10-CM | POA: Insufficient documentation

## 2016-04-16 DIAGNOSIS — G609 Hereditary and idiopathic neuropathy, unspecified: Secondary | ICD-10-CM | POA: Diagnosis not present

## 2016-04-16 MED ORDER — DIPHENHYDRAMINE HCL 25 MG PO CAPS
25.0000 mg | ORAL_CAPSULE | ORAL | Status: DC
  Administered 2016-04-16: 25 mg via ORAL

## 2016-04-16 MED ORDER — DEXTROSE 5 % IV SOLN: INTRAVENOUS | Status: DC

## 2016-04-16 MED ORDER — ACETAMINOPHEN 325 MG PO TABS
650.0000 mg | ORAL_TABLET | ORAL | Status: DC
  Administered 2016-04-16: 650 mg via ORAL

## 2016-04-16 MED ORDER — IMMUNE GLOBULIN (HUMAN) 10 GM/100ML IJ SOLN
70.0000 g | INTRAMUSCULAR | Status: DC
  Administered 2016-04-16: 70 g via INTRAVENOUS
  Filled 2016-04-16: qty 700

## 2016-04-16 MED ORDER — IMMUNE GLOBULIN (HUMAN) 10 GM/100ML IJ SOLN: 20.0000 g | INTRAMUSCULAR | Status: DC

## 2016-04-16 MED ORDER — ACETAMINOPHEN 325 MG PO TABS
ORAL_TABLET | ORAL | Status: AC
  Filled 2016-04-16: qty 2

## 2016-04-16 MED ORDER — DIPHENHYDRAMINE HCL 25 MG PO CAPS
ORAL_CAPSULE | ORAL | Status: AC
  Filled 2016-04-16: qty 1

## 2016-04-29 DIAGNOSIS — M3219 Other organ or system involvement in systemic lupus erythematosus: Secondary | ICD-10-CM | POA: Diagnosis not present

## 2016-04-29 DIAGNOSIS — R29898 Other symptoms and signs involving the musculoskeletal system: Secondary | ICD-10-CM | POA: Diagnosis not present

## 2016-04-29 DIAGNOSIS — Z79899 Other long term (current) drug therapy: Secondary | ICD-10-CM | POA: Diagnosis not present

## 2016-05-29 ENCOUNTER — Encounter: Payer: Self-pay | Admitting: Family Medicine

## 2016-06-11 ENCOUNTER — Ambulatory Visit (HOSPITAL_COMMUNITY)
Admission: RE | Admit: 2016-06-11 | Discharge: 2016-06-11 | Disposition: A | Discharge status: Home / Self Care | Payer: Medicare Other | Source: Ambulatory Visit | Attending: Internal Medicine | Admitting: Internal Medicine

## 2016-06-11 NOTE — Progress Notes (Signed)
Pt came in today for iv IGG.  We have been trying since may (th to get orders.  I called to people today for orders.  I was told the nurses were busy, leave a message.  I was transferred to Dr Milta Deiters number I got a voicemail.  I Left a message.  He was my 5th attempt to get orders.  We have cancelled the patient and we will not reschedule her until we have orders.

## 2016-06-18 ENCOUNTER — Encounter: Payer: Self-pay | Admitting: Family Medicine

## 2016-06-18 ENCOUNTER — Ambulatory Visit (HOSPITAL_BASED_OUTPATIENT_CLINIC_OR_DEPARTMENT_OTHER)
Admission: RE | Admit: 2016-06-18 | Discharge: 2016-06-18 | Disposition: A | Discharge status: Home / Self Care | Payer: Medicare Other | Source: Ambulatory Visit | Attending: Family Medicine | Admitting: Family Medicine

## 2016-06-18 ENCOUNTER — Ambulatory Visit (INDEPENDENT_AMBULATORY_CARE_PROVIDER_SITE_OTHER): Payer: Medicare Other | Admitting: Family Medicine

## 2016-06-18 ENCOUNTER — Other Ambulatory Visit: Payer: Self-pay | Admitting: Family Medicine

## 2016-06-18 VITALS — BP 124/79 | HR 86 | Temp 98.2°F | Resp 16 | Ht 68.0 in | Wt 156.8 lb

## 2016-06-18 DIAGNOSIS — R1011 Right upper quadrant pain: Secondary | ICD-10-CM | POA: Diagnosis not present

## 2016-06-18 DIAGNOSIS — N281 Cyst of kidney, acquired: Secondary | ICD-10-CM

## 2016-06-18 DIAGNOSIS — R1013 Epigastric pain: Secondary | ICD-10-CM

## 2016-06-18 DIAGNOSIS — M62838 Other muscle spasm: Secondary | ICD-10-CM | POA: Diagnosis not present

## 2016-06-18 DIAGNOSIS — R10816 Epigastric abdominal tenderness: Secondary | ICD-10-CM | POA: Insufficient documentation

## 2016-06-18 DIAGNOSIS — K219 Gastro-esophageal reflux disease without esophagitis: Secondary | ICD-10-CM

## 2016-06-18 LAB — COMPREHENSIVE METABOLIC PANEL
ALT: 25 U/L (ref 0–35)
AST: 28 U/L (ref 0–37)
Albumin: 4 g/dL (ref 3.5–5.2)
Alkaline Phosphatase: 74 U/L (ref 39–117)
BUN: 13 mg/dL (ref 6–23)
CO2: 23 mEq/L (ref 19–32)
Calcium: 9.2 mg/dL (ref 8.4–10.5)
Chloride: 108 mEq/L (ref 96–112)
Creatinine, Ser: 0.71 mg/dL (ref 0.40–1.20)
GFR: 109.91 mL/min (ref >60.00)
Glucose, Bld: 90 mg/dL (ref 70–99)
Potassium: 4 mEq/L (ref 3.5–5.1)
Sodium: 140 mEq/L (ref 135–145)
Total Bilirubin: 0.2 mg/dL (ref 0.2–1.2)
Total Protein: 7.4 g/dL (ref 6.0–8.3)

## 2016-06-18 LAB — CBC WITH DIFFERENTIAL/PLATELET
Basophils Absolute: 0 10*3/uL (ref 0.0–0.1)
Basophils Relative: 1 % (ref 0.0–3.0)
Eosinophils Absolute: 0.1 10*3/uL (ref 0.0–0.7)
Eosinophils Relative: 2.6 % (ref 0.0–5.0)
HCT: 39.4 % (ref 36.0–46.0)
Hemoglobin: 12.6 g/dL (ref 12.0–15.0)
Lymphocytes Relative: 28.1 % (ref 12.0–46.0)
Lymphs Abs: 1.2 10*3/uL (ref 0.7–4.0)
MCHC: 32.1 g/dL (ref 30.0–36.0)
MCV: 89.5 fl (ref 78.0–100.0)
Monocytes Absolute: 0.5 10*3/uL (ref 0.1–1.0)
Monocytes Relative: 10.7 % (ref 3.0–12.0)
Neutro Abs: 2.5 10*3/uL (ref 1.4–7.7)
Neutrophils Relative %: 57.6 % (ref 43.0–77.0)
Platelets: 241 10*3/uL (ref 150.0–400.0)
RBC: 4.4 Mil/uL (ref 3.87–5.11)
RDW: 13.3 % (ref 11.5–15.5)
WBC: 4.2 10*3/uL (ref 4.0–10.5)

## 2016-06-18 LAB — H. PYLORI ANTIBODY, IGG: H Pylori IgG: POSITIVE — AB

## 2016-06-18 MED ORDER — CYCLOBENZAPRINE HCL 10 MG PO TABS: 10.0000 mg | ORAL_TABLET | Freq: Three times a day (TID) | ORAL | 2 refills | Status: DC | PRN

## 2016-06-18 MED ORDER — DEXLANSOPRAZOLE 60 MG PO CPDR: 60.0000 mg | DELAYED_RELEASE_CAPSULE | Freq: Every day | ORAL | 5 refills | Status: DC

## 2016-06-18 NOTE — Progress Notes (Signed)
Patient ID: Samantha [name], female   DOB: 31-May-1960, 56 y.o.   MRN: [id number]     Subjective:  I acted as a Education administrator for Dr. Carollee Herter.  Guerry Bruin, [place]   Patient ID: Samantha [name], female    DOB: 06-Dec-1960, 56 y.o.   MRN: [id number]  Chief Complaint  Patient presents with  . Back Pain    Back Pain  Episode onset: 3 weeks. The problem occurs constantly. The pain is present in the lumbar spine (low right and center). The quality of the pain is described as aching, burning, cramping, shooting and stabbing. The pain does not radiate. The pain is at a severity of 10/10. The pain is severe. The pain is the same all the time. Associated symptoms include abdominal pain. Pertinent negatives include no bladder incontinence, bowel incontinence, chest pain, dysuria, fever, headaches, leg pain, numbness, paresis, paresthesias, tingling or weakness. She has tried analgesics (bayer back and body and tylenol arthritis) for the symptoms. The treatment provided mild relief.    Patient is in today for back pain and muscle spasms. Pt spoke to a nurse at urgent care and was told to lay off the advil and take tylenol.  Not worsened with food or movement.  Pt is taking her protonix        Patient Care Team: Carollee Herter, Alferd Apa, DO as PCP - General (Family Medicine) Candee Furbish, MD as Referring Physician (Rheumatology) Inda Castle, MD as Consulting Physician (Gastroenterology) Melissa Noon, [place] as Referring Physician (Optometry)   Past Medical History:  Diagnosis Date  . Altered mental status   . Backache, unspecified   . Gastric ulcer, unspecified as acute or chronic, without mention of hemorrhage, perforation, or obstruction   . Helicobacter pylori (H. pylori)   . Tobacco abuse   . Unspecified essential hypertension     Past Surgical History:  Procedure Laterality Date  . UPPER GASTROINTESTINAL ENDOSCOPY  2013    Family History  Problem Relation Age of Onset  . Diabetes  Sister   . Hypertension Mother   . Colon cancer Neg Hx     Social History   Social History  . Marital status: Single    Spouse name: N/A  . Number of children: N/A  . Years of education: N/A   Occupational History  . permanant disability [employer]   Social History Main Topics  . Smoking status: Current Every Day Smoker    Packs/day: 0.01    Years: 35.00    Types: Cigarettes  . Smokeless tobacco: Never Used     Comment: 3-5 cig a day  . Alcohol use Yes     Comment: occassional; beer   . Drug use: No  . Sexual activity: Not Currently    Partners: Male   Other Topics Concern  . Not on file   Social History Narrative   Exercise--- walking    Outpatient Medications Prior to Visit  Medication Sig Dispense Refill  . hydroxychloroquine (PLAQUENIL) 200 MG tablet Take 200 mg by mouth 2 (two) times daily.    Marland Kitchen losartan (COZAAR) 50 MG tablet Take 1 tablet (50 mg total) by mouth daily. 90 tablet 3  . metoprolol succinate (TOPROL-XL) 100 MG 24 hr tablet TAKE 1 TABLET BY MOUTH DAILY WITH OR IMMEDIATELY FOLLOWING A MEAL 90 tablet 0  . Multiple Vitamin (MULTIVITAMIN) tablet Take 1 tablet by mouth daily.    . mycophenolate (CELLCEPT) 500 MG tablet Take 1,500 mg by mouth 2 (two) times daily.     Marland Kitchen  pantoprazole (PROTONIX) 40 MG tablet Take 1 tablet (40 mg total) by mouth daily. 90 tablet 3  . cyclobenzaprine (FLEXERIL) 10 MG tablet Take 1 tablet (10 mg total) by mouth 3 (three) times daily as needed. (Patient not taking: Reported on 06/18/2016) 30 tablet 2  . HYDROcodone-acetaminophen (NORCO/VICODIN) 5-325 MG tablet Take 1 tablet by mouth every 4 (four) hours as needed. 16 tablet 0  . NONFORMULARY OR COMPOUNDED ITEM Cane As directed 1 each 0  . ondansetron (ZOFRAN) 4 MG tablet Take 1 tablet (4 mg total) by mouth every 8 (eight) hours as needed for nausea or vomiting. 20 tablet 0  . oxyCODONE-acetaminophen (PERCOCET) 10-325 MG tablet Take 1 tablet by mouth every 8 (eight) hours as  needed. 60 tablet 0   No facility-administered medications prior to visit.     Allergies  Allergen Reactions  . Tramadol Nausea Only    Feels weirded out    Review of Systems  Constitutional: Negative for fever and malaise/fatigue.  HENT: Negative for congestion.   Eyes: Negative for blurred vision.  Respiratory: Negative for cough and shortness of breath.   Cardiovascular: Negative for chest pain, palpitations and leg swelling.  Gastrointestinal: Positive for abdominal pain. Negative for bowel incontinence and vomiting.  Genitourinary: Negative for bladder incontinence and dysuria.  Musculoskeletal: Positive for back pain.  Skin: Negative for rash.  Neurological: Negative for tingling, loss of consciousness, weakness, numbness, headaches and paresthesias.       Objective:    Physical Exam  Constitutional: She is oriented to person, place, and time. She appears well-developed and well-nourished. No distress.  HENT:  Head: Normocephalic and atraumatic.  Eyes: Conjunctivae and EOM are normal.  Neck: Normal range of motion. Neck supple. No JVD present. Carotid bruit is not present. No thyromegaly present.  Cardiovascular: Normal rate, regular rhythm and normal heart sounds.   No murmur heard. Pulmonary/Chest: Effort normal and breath sounds normal. No respiratory distress. She has no wheezes. She has no rales. She exhibits no tenderness.  Abdominal: Soft. Bowel sounds are normal. There is tenderness in the right upper quadrant and epigastric area. There is rebound and guarding.    Musculoskeletal: Normal range of motion. She exhibits no edema or deformity.  Neurological: She is alert and oriented to person, place, and time.  Skin: Skin is warm and dry. She is not diaphoretic.  Psychiatric: She has a normal mood and affect.  Nursing note and vitals reviewed.   BP 124/79 (BP Location: Left Arm, Cuff Size: Normal)   Pulse 86   Temp 98.2 F (36.8 C) (Oral)   Resp 16   Ht  5\' 8"  (1.727 m)   Wt 156 lb 12.8 oz (71.1 kg)   LMP 04/11/2011   SpO2 98%   BMI 23.84 kg/m  Wt Readings from Last 3 Encounters:  06/18/16 156 lb 12.8 oz (71.1 kg)  04/16/16 160 lb (72.6 kg)  02/20/16 160 lb (72.6 kg)   BP Readings from Last 3 Encounters:  06/18/16 124/79  04/16/16 (!) 155/87  02/20/16 135/77     Immunization History  Administered Date(s) Administered  . Tdap 09/15/2012    Health Maintenance  Topic Date Due  . COLONOSCOPY  03/14/2017  . MAMMOGRAM  12/31/2017  . PAP SMEAR  12/25/2018  . TETANUS/TDAP  09/16/2022  . Hepatitis C Screening  Completed  . HIV Screening  Completed    Lab Results  Component Value Date   WBC 3.8 (L) 12/25/2015   HGB 13.6 12/25/2015  HCT 40.6 12/25/2015   PLT 211.0 12/25/2015   GLUCOSE 90 12/25/2015   CHOL 195 12/25/2015   TRIG 86.0 12/25/2015   HDL 52.50 12/25/2015   LDLDIRECT 141.8 09/15/2012   LDLCALC 125 (H) 12/25/2015   ALT 16 12/25/2015   AST 23 12/25/2015   NA 137 12/25/2015   K 4.3 12/25/2015   CL 106 12/25/2015   CREATININE 0.67 12/25/2015   BUN 14 12/25/2015   CO2 26 12/25/2015   TSH 0.83 09/15/2012   INR 1.14 02/26/2011   HGBA1C 5.6 03/13/2011    Lab Results  Component Value Date   TSH 0.83 09/15/2012   Lab Results  Component Value Date   WBC 3.8 (L) 12/25/2015   HGB 13.6 12/25/2015   HCT 40.6 12/25/2015   MCV 87.2 12/25/2015   PLT 211.0 12/25/2015   Lab Results  Component Value Date   NA 137 12/25/2015   K 4.3 12/25/2015   CO2 26 12/25/2015   GLUCOSE 90 12/25/2015   BUN 14 12/25/2015   CREATININE 0.67 12/25/2015   BILITOT 0.2 12/25/2015   ALKPHOS 47 12/25/2015   AST 23 12/25/2015   ALT 16 12/25/2015   PROT 8.1 12/25/2015   ALBUMIN 3.9 12/25/2015   CALCIUM 9.1 12/25/2015   ANIONGAP 9 12/13/2015   GFR 117.73 12/25/2015   Lab Results  Component Value Date   CHOL 195 12/25/2015   Lab Results  Component Value Date   HDL 52.50 12/25/2015   Lab Results  Component Value Date    LDLCALC 125 (H) 12/25/2015   Lab Results  Component Value Date   TRIG 86.0 12/25/2015   Lab Results  Component Value Date   CHOLHDL 4 12/25/2015   Lab Results  Component Value Date   HGBA1C 5.6 03/13/2011         Assessment & Plan:   Problem List Items Addressed This Visit    None    Visit Diagnoses    Epigastric pain    -  Primary   Relevant Orders   US Abdomen Complete   CBC with Differential/Platelet   H. pylori antibody, IgG   Comprehensive metabolic panel   Right upper quadrant abdominal pain       Relevant Orders   US Abdomen Complete   CBC with Differential/Platelet   H. pylori antibody, IgG   Comprehensive metabolic panel   Muscle spasm       Relevant Medications   cyclobenzaprine (FLEXERIL) 10 MG tablet   Gastroesophageal reflux disease without esophagitis       Relevant Medications   dexlansoprazole (DEXILANT) 60 MG capsule      I have discontinued Ms. Mcglaun's NONFORMULARY OR COMPOUNDED ITEM, oxyCODONE-acetaminophen, pantoprazole, HYDROcodone-acetaminophen, and ondansetron. I am also having her start on dexlansoprazole. Additionally, I am having her maintain her multivitamin, hydroxychloroquine, mycophenolate, losartan, metoprolol succinate, and cyclobenzaprine.  Meds ordered this encounter  Medications  . cyclobenzaprine (FLEXERIL) 10 MG tablet    Sig: Take 1 tablet (10 mg total) by mouth 3 (three) times daily as needed.    Dispense:  30 tablet    Refill:  2  . dexlansoprazole (DEXILANT) 60 MG capsule    Sig: Take 1 capsule (60 mg total) by mouth daily.    Dispense:  30 capsule    Refill:  5    CMA served as scribe during this visit. History, Physical and Plan performed by medical provider. Documentation and orders reviewed and attested to.  [name], DO

## 2016-06-18 NOTE — Patient Instructions (Signed)
Food Choices for Gastroesophageal Reflux Disease, Adult When you have gastroesophageal reflux disease (GERD), the foods you eat and your eating habits are very important. Choosing the right foods can help ease your discomfort. What guidelines do I need to follow?  Choose fruits, vegetables, whole grains, and low-fat dairy products.  Choose low-fat meat, fish, and poultry.  Limit fats such as oils, salad dressings, butter, nuts, and avocado.  Keep a food diary. This helps you identify foods that cause symptoms.  Avoid foods that cause symptoms. These may be different for everyone.  Eat small meals often instead of 3 large meals a day.  Eat your meals slowly, in a place where you are relaxed.  Limit fried foods.  Cook foods using methods other than frying.  Avoid drinking alcohol.  Avoid drinking large amounts of liquids with your meals.  Avoid bending over or lying down until 2-3 hours after eating. What foods are not recommended? These are some foods and drinks that may make your symptoms worse: Vegetables  Tomatoes. Tomato juice. Tomato and spaghetti sauce. Chili peppers. Onion and garlic. Horseradish. Fruits  Oranges, grapefruit, and lemon (fruit and juice). Meats  High-fat meats, fish, and poultry. This includes hot dogs, ribs, ham, sausage, salami, and bacon. Dairy  Whole milk and chocolate milk. Sour cream. Cream. Butter. Ice cream. Cream cheese. Drinks  Coffee and tea. Bubbly (carbonated) drinks or energy drinks. Condiments  Hot sauce. Barbecue sauce. Sweets/Desserts  Chocolate and cocoa. Donuts. Peppermint and spearmint. Fats and Oils  High-fat foods. This includes French fries and potato chips. Other  Vinegar. Strong spices. This includes black pepper, white pepper, red pepper, cayenne, curry powder, cloves, ginger, and chili powder. The items listed above may not be a complete list of foods and drinks to avoid. Contact your dietitian for more information.    This information is not intended to replace advice given to you by your health care provider. Make sure you discuss any questions you have with your health care provider. Document Released: 07/16/2011 Document Revised: 06/22/2015 Document Reviewed: 11/18/2012 Elsevier Interactive Patient Education  2017 Elsevier Inc.  

## 2016-06-20 ENCOUNTER — Other Ambulatory Visit: Payer: Self-pay | Admitting: Family Medicine

## 2016-06-20 DIAGNOSIS — A048 Other specified bacterial intestinal infections: Secondary | ICD-10-CM

## 2016-06-21 ENCOUNTER — Other Ambulatory Visit (HOSPITAL_COMMUNITY): Payer: Self-pay | Admitting: *Deleted

## 2016-06-25 ENCOUNTER — Encounter (HOSPITAL_COMMUNITY)
Admission: RE | Admit: 2016-06-25 | Discharge: 2016-06-25 | Disposition: A | Discharge status: Home / Self Care | Payer: Medicare Other | Source: Ambulatory Visit | Attending: Internal Medicine | Admitting: Internal Medicine

## 2016-06-25 DIAGNOSIS — G609 Hereditary and idiopathic neuropathy, unspecified: Secondary | ICD-10-CM | POA: Diagnosis not present

## 2016-06-25 DIAGNOSIS — M329 Systemic lupus erythematosus, unspecified: Secondary | ICD-10-CM | POA: Insufficient documentation

## 2016-06-25 MED ORDER — DEXTROSE 5 % IV SOLN
Freq: Once | INTRAVENOUS | Status: AC
  Administered 2016-06-25: 08:00:00 via INTRAVENOUS

## 2016-06-25 MED ORDER — DIPHENHYDRAMINE HCL 25 MG PO CAPS
ORAL_CAPSULE | ORAL | Status: AC
  Filled 2016-06-25: qty 1

## 2016-06-25 MED ORDER — ACETAMINOPHEN 325 MG PO TABS
650.0000 mg | ORAL_TABLET | Freq: Once | ORAL | Status: AC
  Administered 2016-06-25: 650 mg via ORAL

## 2016-06-25 MED ORDER — ACETAMINOPHEN 325 MG PO TABS
ORAL_TABLET | ORAL | Status: AC
  Filled 2016-06-25: qty 2

## 2016-06-25 MED ORDER — DIPHENHYDRAMINE HCL 25 MG PO CAPS
25.0000 mg | ORAL_CAPSULE | Freq: Once | ORAL | Status: AC
  Administered 2016-06-25: 25 mg via ORAL

## 2016-06-25 MED ORDER — IMMUNE GLOBULIN (HUMAN) 10 GM/100ML IJ SOLN
70.0000 g | Freq: Once | INTRAMUSCULAR | Status: AC
  Administered 2016-06-25: 70 g via INTRAVENOUS
  Filled 2016-06-25: qty 700

## 2016-06-26 ENCOUNTER — Encounter: Payer: Self-pay | Admitting: Nurse Practitioner

## 2016-06-26 ENCOUNTER — Ambulatory Visit (INDEPENDENT_AMBULATORY_CARE_PROVIDER_SITE_OTHER): Payer: Medicare Other | Admitting: Nurse Practitioner

## 2016-06-26 VITALS — BP 152/80 | HR 80 | Ht 67.0 in | Wt 160.0 lb

## 2016-06-26 DIAGNOSIS — M791 Myalgia: Secondary | ICD-10-CM

## 2016-06-26 DIAGNOSIS — M7918 Myalgia, other site: Secondary | ICD-10-CM

## 2016-06-26 DIAGNOSIS — A048 Other specified bacterial intestinal infections: Secondary | ICD-10-CM | POA: Diagnosis not present

## 2016-06-26 NOTE — Progress Notes (Signed)
HPI: Patient is a 56 year old female known to Dr. Loletha Carrow. She was seen in 2013 and again in July 2017 for chest pain, felt to be chest wall pain. EGD with biopsies by Dr. Deatra Ina in 2013  for evaluation of chest pain was unremarkable except for what looked endoscopically like candida esophagitis. Biopsies negative for candida, demonstrated inflamed squamous epithelium. No inflammation or eosinophilic esophagitis. No intestinal dysplasia or malignancy.   Patient referred by PCP, Dr. Etter Sjogren for epigastric pain / positive H.Pylori IgG. Patient initially told me the pain started four weeks ago but then upon further questioning decided that the pain is exactly the same as before. The pain is over the xiphoid process. It is constant, not related to eating. Pain is worse with certain movements. She feels like something is stuck in the area.  It sounds like the pain got better but came back a few weeks ago she tried taking Goody powders and Bayer aspirin for the chest pain but neither really helped. She was recently started on Flexeril and Dexilant, she thinks these are helping to some degree  Past Medical History:  Diagnosis Date  . Altered mental status   . Backache, unspecified   . Gastric ulcer, unspecified as acute or chronic, without mention of hemorrhage, perforation, or obstruction   . Helicobacter pylori (H. pylori)   . Tobacco abuse   . Unspecified essential hypertension     Patient's surgical history, family medical history, social history, medications and allergies were all reviewed in Epic    Physical Exam: BP (!) 152/80   Pulse 80   Ht 5\' 7"  (1.702 m)   Wt 160 lb (72.6 kg)   LMP 04/11/2011   BMI 25.06 kg/m   GENERAL: well developed black female in NAD PSYCH: :Pleasant, cooperative, normal affect EENT:  conjunctiva pink, mucous membranes moist, neck supple without masses CARDIAC:  RRR, no murmur heard, no peripheral edema PULM: Normal respiratory effort, lungs CTA  bilaterally, no wheezing ABDOMEN:  soft, nontender, nondistended, no obvious masses,   normal bowel sounds SKIN:  turgor, no lesions seen Musculoskeletal:  Normal muscle tone, normal strength, Marked tenderness of Xiphoid process.  NEURO: Alert and oriented x 3, no focal neurologic deficits   ASSESSMENT and PLAN:  1. 56 yo female with chronic chest pain. Evaluated by [place] in 2013 and 2017, felt to have musculoskeletal chest wall pain. She is markedly tender to even light palpation over the xiphoid process (it made her cry) . Of note, abdominal u/s, chemistry profile and CBC all normal. History and exam is still compatible with musculoskeletal pain. She is feeling better with flexeril and Dexilant.  -Not sure if or why Dexilant helping since chest pain probably musculoskeletal in nature. Holding the [place] for 2 weeks to obtain H.pylori stool antigen study. If she regresses then can restart the PPI. She is tolerating Flexeril without excessive sleepiness so I left that going. At some point she may benefit from a short trial of NSAIDs if pain persists.     2. Positive H.pylori IgG. This was positive in 2017,  treated with triple therapy. Checked again last week and still positive.   -The H.pylori IgG will usually remains positive despite treatment. To confirm active infection a breath test, stool antigen test or biopsies would be needed. Will hold Dexilant 14 days and obtain stool antigen study to check for eradication.   3. Colon cancer screening. She is up-to-date on colonoscopy, last one February 2016   [facility]  Chester Holstein , NP 06/26/2016, 11:01 AM   Cc: Loura Pardon, MD

## 2016-06-26 NOTE — Patient Instructions (Addendum)
If you are age 55 or older, your body mass index should be between 23-30. Your Body mass index is 25.06 kg/m. If this is out of the aforementioned range listed, please consider follow up with your Primary Care Provider.  If you are age 89 or younger, your body mass index should be between 19-25. Your Body mass index is 25.06 kg/m. If this is out of the aformentioned range listed, please consider follow up with your Primary Care Provider.   Your physician has requested that you go to the basement for the following lab work in two weeks: June 13,2018.  STOP  Dexilant (all stomach medications) for 14 days prior to lab test.   Thank you for choosing me and [facility].   Tye Savoy, NP

## 2016-06-28 NOTE — Progress Notes (Signed)
Thank you for sending this case to me. I have reviewed the entire note as well as my prior office notes, and the outlined plan seems appropriate.  I agree about the H. Pylori  Wilfrid Lund, MD

## 2016-07-02 ENCOUNTER — Telehealth: Payer: Self-pay | Admitting: Family Medicine

## 2016-07-02 NOTE — Telephone Encounter (Signed)
Mailed letter to patient on 5/2 informing that appointment scheduled for 7/2 had been rescheduled to  7/9 due to providers schedule. Left message on patients voicemail 6/5 asking patient to contact the office to confirm that letter had been received.

## 2016-07-10 ENCOUNTER — Other Ambulatory Visit: Payer: Medicare Other

## 2016-07-12 ENCOUNTER — Other Ambulatory Visit: Payer: Medicare Other

## 2016-07-12 DIAGNOSIS — A048 Other specified bacterial intestinal infections: Secondary | ICD-10-CM

## 2016-07-15 LAB — H. PYLORI ANTIGEN, STOOL: H pylori Ag, Stl: NEGATIVE

## 2016-07-29 ENCOUNTER — Ambulatory Visit: Payer: Medicare Other | Admitting: Family Medicine

## 2016-08-05 ENCOUNTER — Ambulatory Visit (INDEPENDENT_AMBULATORY_CARE_PROVIDER_SITE_OTHER): Payer: Medicare Other | Admitting: Family Medicine

## 2016-08-05 ENCOUNTER — Encounter: Payer: Self-pay | Admitting: Family Medicine

## 2016-08-05 VITALS — BP 150/88 | HR 68 | Temp 98.3°F | Resp 16 | Ht 67.0 in | Wt 160.8 lb

## 2016-08-05 DIAGNOSIS — K219 Gastro-esophageal reflux disease without esophagitis: Secondary | ICD-10-CM

## 2016-08-05 DIAGNOSIS — I10 Essential (primary) hypertension: Secondary | ICD-10-CM | POA: Diagnosis not present

## 2016-08-05 DIAGNOSIS — G610 Guillain-Barre syndrome: Secondary | ICD-10-CM

## 2016-08-05 DIAGNOSIS — M328 Other forms of systemic lupus erythematosus: Secondary | ICD-10-CM

## 2016-08-05 DIAGNOSIS — Z8669 Personal history of other diseases of the nervous system and sense organs: Secondary | ICD-10-CM

## 2016-08-05 DIAGNOSIS — F17200 Nicotine dependence, unspecified, uncomplicated: Secondary | ICD-10-CM | POA: Diagnosis not present

## 2016-08-05 MED ORDER — LOSARTAN POTASSIUM 100 MG PO TABS: 100.0000 mg | ORAL_TABLET | Freq: Every day | ORAL | 3 refills | Status: DC

## 2016-08-05 MED ORDER — ASPIRIN EC 81 MG PO TBEC: 81.0000 mg | DELAYED_RELEASE_TABLET | Freq: Every day | ORAL | Status: AC

## 2016-08-05 MED ORDER — NICOTINE 10 MG IN INHA: 1.0000 | RESPIRATORY_TRACT | 0 refills | Status: DC | PRN

## 2016-08-05 MED ORDER — DEXLANSOPRAZOLE 60 MG PO CPDR: 60.0000 mg | DELAYED_RELEASE_CAPSULE | Freq: Every day | ORAL | 5 refills | Status: DC

## 2016-08-05 NOTE — Assessment & Plan Note (Signed)
Pt requesting referral to rheum in [place] is too far to travel

## 2016-08-05 NOTE — Patient Instructions (Signed)
Preventing Hypertension Hypertension, commonly called high blood pressure, is when the force of blood pumping through the arteries is too strong. Arteries are blood vessels that carry blood from the heart throughout the body. Over time, hypertension can damage the arteries and decrease blood flow to important parts of the body, including the brain, heart, and kidneys. Often, hypertension does not cause symptoms until blood pressure is very high. For this reason, it is important to have your blood pressure checked on a regular basis. Hypertension can often be prevented with diet and lifestyle changes. If you already have hypertension, you can control it with diet and lifestyle changes, as well as medicine. What nutrition changes can be made? Maintain a healthy diet. This includes:  Eating less salt (sodium). Ask your health care provider how much sodium is safe for you to have. The general recommendation is to consume less than 1 tsp (2,300 mg) of sodium a day. ? Do not add salt to your food. ? Choose low-sodium options when grocery shopping and eating out.  Limiting fats in your diet. You can do this by eating low-fat or fat-free dairy products and by eating less red meat.  Eating more fruits, vegetables, and whole grains. Make a goal to eat: ? 1-2 cups of fresh fruits and vegetables each day. ? 3-4 servings of whole grains each day.  Avoiding foods and beverages that have added sugars.  Eating fish that contain healthy fats (omega-3 fatty acids), such as mackerel or salmon.  If you need help putting together a healthy eating plan, try the DASH diet. This diet is high in fruits, vegetables, and whole grains. It is low in sodium, red meat, and added sugars. DASH stands for Dietary Approaches to Stop Hypertension. What lifestyle changes can be made?  Lose weight if you are overweight. Losing just 3?5% of your body weight can help prevent or control hypertension. ? For example, if your present  weight is 200 lb (91 kg), a loss of 3-5% of your weight means losing 6-10 lb (2.7-4.5 kg). ? Ask your health care provider to help you with a diet and exercise plan to safely lose weight.  Get enough exercise. Do at least 150 minutes of moderate-intensity exercise each week. ? You could do this in short exercise sessions several times a day, or you could do longer exercise sessions a few times a week. For example, you could take a brisk 10-minute walk or bike ride, 3 times a day, for 5 days a week.  Find ways to reduce stress, such as exercising, meditating, listening to music, or taking a yoga class. If you need help reducing stress, ask your health care provider.  Do not smoke. This includes e-cigarettes. Chemicals in tobacco and nicotine products raise your blood pressure each time you smoke. If you need help quitting, ask your health care provider.  Avoid alcohol. If you drink alcohol, limit alcohol intake to no more than 1 drink a day for nonpregnant women and 2 drinks a day for men. One drink equals 12 oz of beer, 5 oz of wine, or 1 oz of hard liquor. Why are these changes important? Diet and lifestyle changes can help you prevent hypertension, and they may make you feel better overall and improve your quality of life. If you have hypertension, making these changes will help you control it and help prevent major complications, such as:  Hardening and narrowing of arteries that supply blood to: ? Your heart. This can cause a heart  attack. °? Your brain. This can cause a stroke. °? Your kidneys. This can cause kidney failure. °· Stress on your heart muscle, which can cause heart failure. ° °What can I do to lower my risk? °· Work with your health care provider to make a hypertension prevention plan that works for you. Follow your plan and keep all follow-up visits as told by your health care provider. °· Learn how to check your blood pressure at home. Make sure that you know your personal target  blood pressure, as told by your health care provider. °How is this treated? °In addition to diet and lifestyle changes, your health care provider may recommend medicines to help lower your blood pressure. You may need to try a few different medicines to find what works best for you. You also may need to take more than one medicine. Take over-the-counter and prescription medicines only as told by your health care provider. °Where to find support: °Your health care provider can help you prevent hypertension and help you keep your blood pressure at a healthy level. Your local hospital or your community may also provide support services and prevention programs. °The American Heart Association offers an online support network at: http://supportnetwork.heart.org/high-blood-pressure °Where to find more information: °Learn more about hypertension from: °· National Heart, Lung, and Blood Institute: www.nhlbi.nih.gov/health/health-topics/topics/hbp °· Centers for Disease Control and Prevention: www.cdc.gov/bloodpressure °· American Academy of Family Physicians: http://familydoctor.org/familydoctor/en/diseases-conditions/high-blood-pressure.printerview.all.html ° °Learn more about the DASH diet from: °· National Heart, Lung, and Blood Institute: www.nhlbi.nih.gov/health/health-topics/topics/dash ° °Contact a health care provider if: °· You think you are having a reaction to medicines you have taken. °· You have recurrent headaches or feel dizzy. °· You have swelling in your ankles. °· You have trouble with your vision. °Summary °· Hypertension often does not cause any symptoms until blood pressure is very high. It is important to get your blood pressure checked regularly. °· Diet and lifestyle changes are the most important steps in preventing hypertension. °· By keeping your blood pressure in a healthy range, you can prevent complications like heart attack, heart failure, stroke, and kidney failure. °· Work with your health  care provider to make a hypertension prevention plan that works for you. °This information is not intended to replace advice given to you by your health care provider. Make sure you discuss any questions you have with your health care provider. °Document Released: 01/29/2015 Document Revised: 09/25/2015 Document Reviewed: 09/25/2015 °Elsevier Interactive Patient Education © 2017 Elsevier Inc. ° °

## 2016-08-05 NOTE — Progress Notes (Signed)
Patient ID: Samantha [name], female   DOB: 05/18/1960, 56 y.o.   MRN: [id number]     Subjective:  I acted as a Education administrator for Dr. Carollee Herter.  Guerry Bruin, [place]   Patient ID: Samantha [name], female    DOB: [birth year], 56 y.o.   MRN: [id number]  Chief Complaint  Patient presents with  . Hypertension    HPI  Patient is in today for follow up blood pressure.  Pt is smoking 1 cig a day---she makes it last all day.  She would like the nicotrol inh again.  Pt is also asking for a new rheumatologist closer to home.  It is hard to drive to [place]. Nurse from [facility] came to house and advised she look into PT to see if there was anything more could be done for her legs    Patient Care Team: Carollee Herter, Alferd Apa, DO as PCP - General (Family Medicine) Candee Furbish, MD as Referring Physician (Rheumatology) Inda Castle, MD as Consulting Physician (Gastroenterology) Melissa Noon, [place] as Referring Physician (Optometry)   Past Medical History:  Diagnosis Date  . Altered mental status   . Backache, unspecified   . Gastric ulcer, unspecified as acute or chronic, without mention of hemorrhage, perforation, or obstruction   . Helicobacter pylori (H. pylori)   . Tobacco abuse   . Unspecified essential hypertension     Past Surgical History:  Procedure Laterality Date  . UPPER GASTROINTESTINAL ENDOSCOPY  2013    Family History  Problem Relation Age of Onset  . Diabetes Sister   . Hypertension Mother   . Colon cancer Neg Hx   . Stomach cancer Neg Hx     Social History   Social History  . Marital status: Single    Spouse name: N/A  . Number of children: N/A  . Years of education: N/A   Occupational History  . permanant disability [employer]   Social History Main Topics  . Smoking status: Current Every Day Smoker    Packs/day: 0.01    Years: 35.00    Types: Cigarettes  . Smokeless tobacco: Never Used     Comment: 3-5 cig a day  . Alcohol use Yes     Comment: occassional;  beer   . Drug use: No  . Sexual activity: Not Currently    Partners: Male   Other Topics Concern  . Not on file   Social History Narrative   Exercise--- walking    Outpatient Medications Prior to Visit  Medication Sig Dispense Refill  . cyclobenzaprine (FLEXERIL) 10 MG tablet Take 1 tablet (10 mg total) by mouth 3 (three) times daily as needed. 30 tablet 2  . hydroxychloroquine (PLAQUENIL) 200 MG tablet Take 200 mg by mouth 2 (two) times daily.    . metoprolol succinate (TOPROL-XL) 100 MG 24 hr tablet TAKE 1 TABLET BY MOUTH DAILY WITH OR IMMEDIATELY FOLLOWING A MEAL 90 tablet 0  . Multiple Vitamin (MULTIVITAMIN) tablet Take 1 tablet by mouth daily.    . mycophenolate (CELLCEPT) 500 MG tablet Take 1,500 mg by mouth 2 (two) times daily.     Marland Kitchen dexlansoprazole (DEXILANT) 60 MG capsule Take 1 capsule (60 mg total) by mouth daily. 30 capsule 5  . losartan (COZAAR) 50 MG tablet Take 1 tablet (50 mg total) by mouth daily. 90 tablet 3   No facility-administered medications prior to visit.     Allergies  Allergen Reactions  . Tramadol Nausea Only    Feels weirded  out    Review of Systems  Constitutional: Negative for chills, fever and malaise/fatigue.  HENT: Negative for congestion and hearing loss.   Eyes: Negative for discharge.  Respiratory: Negative for cough, sputum production and shortness of breath.   Cardiovascular: Negative for chest pain, palpitations and leg swelling.  Gastrointestinal: Negative for abdominal pain, blood in stool, constipation, diarrhea, heartburn, nausea and vomiting.  Genitourinary: Negative for dysuria, frequency, hematuria and urgency.  Musculoskeletal: Negative for back pain, falls and myalgias.  Skin: Negative for rash.  Neurological: Positive for focal weakness. Negative for dizziness, sensory change, loss of consciousness, weakness and headaches.  Endo/Heme/Allergies: Negative for environmental allergies. Does not bruise/bleed easily.    Psychiatric/Behavioral: Negative for depression and suicidal ideas. The patient is not nervous/anxious and does not have insomnia.        Objective:    Physical Exam  Constitutional: She is oriented to person, place, and time. She appears well-developed and well-nourished.  HENT:  Head: Normocephalic and atraumatic.  Eyes: Conjunctivae and EOM are normal.  Neck: Normal range of motion. Neck supple. No JVD present. Carotid bruit is not present. No thyromegaly present.  Cardiovascular: Normal rate, regular rhythm and normal heart sounds.   No murmur heard. Pulmonary/Chest: Effort normal and breath sounds normal. No respiratory distress. She has no wheezes. She has no rales. She exhibits no tenderness.  Musculoskeletal: She exhibits no edema.  Neurological: She is alert and oriented to person, place, and time.  L leg weakness -- residual  Psychiatric: She has a normal mood and affect. Her behavior is normal.  Nursing note and vitals reviewed.   BP (!) 150/88   Pulse 68   Temp 98.3 F (36.8 C) (Oral)   Resp 16   Ht 5\' 7"  (1.702 m)   Wt 160 lb 12.8 oz (72.9 kg)   LMP 04/11/2011   SpO2 97%   BMI 25.18 kg/m  Wt Readings from Last 3 Encounters:  08/05/16 160 lb 12.8 oz (72.9 kg)  06/26/16 160 lb (72.6 kg)  06/25/16 160 lb (72.6 kg)   BP Readings from Last 3 Encounters:  08/05/16 (!) 150/88  06/26/16 (!) 152/80  06/25/16 (!) 170/100     Immunization History  Administered Date(s) Administered  . Tdap 09/15/2012    Health Maintenance  Topic Date Due  . COLONOSCOPY  03/14/2017  . MAMMOGRAM  12/31/2017  . PAP SMEAR  12/25/2018  . TETANUS/TDAP  09/16/2022  . Hepatitis C Screening  Completed  . HIV Screening  Completed    Lab Results  Component Value Date   WBC 4.2 06/18/2016   HGB 12.6 06/18/2016   HCT 39.4 06/18/2016   PLT 241.0 06/18/2016   GLUCOSE 90 06/18/2016   CHOL 195 12/25/2015   TRIG 86.0 12/25/2015   HDL 52.50 12/25/2015   LDLDIRECT 141.8 09/15/2012    LDLCALC 125 (H) 12/25/2015   ALT 25 06/18/2016   AST 28 06/18/2016   NA 140 06/18/2016   K 4.0 06/18/2016   CL 108 06/18/2016   CREATININE 0.71 06/18/2016   BUN 13 06/18/2016   CO2 23 06/18/2016   TSH 0.83 09/15/2012   INR 1.14 02/26/2011   HGBA1C 5.6 03/13/2011    Lab Results  Component Value Date   TSH 0.83 09/15/2012   Lab Results  Component Value Date   WBC 4.2 06/18/2016   HGB 12.6 06/18/2016   HCT 39.4 06/18/2016   MCV 89.5 06/18/2016   PLT 241.0 06/18/2016   Lab Results  Component  Value Date   NA 140 06/18/2016   K 4.0 06/18/2016   CO2 23 06/18/2016   GLUCOSE 90 06/18/2016   BUN 13 06/18/2016   CREATININE 0.71 06/18/2016   BILITOT 0.2 06/18/2016   ALKPHOS 74 06/18/2016   AST 28 06/18/2016   ALT 25 06/18/2016   PROT 7.4 06/18/2016   ALBUMIN 4.0 06/18/2016   CALCIUM 9.2 06/18/2016   ANIONGAP 9 12/13/2015   GFR 109.91 06/18/2016   Lab Results  Component Value Date   CHOL 195 12/25/2015   Lab Results  Component Value Date   HDL 52.50 12/25/2015   Lab Results  Component Value Date   LDLCALC 125 (H) 12/25/2015   Lab Results  Component Value Date   TRIG 86.0 12/25/2015   Lab Results  Component Value Date   CHOLHDL 4 12/25/2015   Lab Results  Component Value Date   HGBA1C 5.6 03/13/2011         Assessment & Plan:   Problem List Items Addressed This Visit      Unprioritized   GBS (Guillain Barre syndrome) ([place])    L leg weakness  Refer to pt       Relevant Medications   nicotine (NICOTROL) 10 MG inhaler   HTN (hypertension) - Primary    Poorly controlled will alter medications, encouraged DASH diet, minimize caffeine and obtain adequate sleep. Report concerning symptoms and follow up as directed and as needed Losartan inc to 100 mg daily      Relevant Medications   losartan (COZAAR) 100 MG tablet   aspirin EC 81 MG tablet   Lupus (systemic lupus erythematosus) (HCC)    Pt requesting referral to rheum in [place] is too far  to travel      Relevant Orders   Ambulatory referral to Rheumatology   Tobacco use disorder    Pt states she is only smoking 1 cig a day Will con't to try to stop all together She said she would like to try the inhaler again       Other Visit Diagnoses    Gastroesophageal reflux disease without esophagitis       Relevant Medications   dexlansoprazole (DEXILANT) 60 MG capsule   Smoking       Relevant Medications   nicotine (NICOTROL) 10 MG inhaler   Hx of Guillain-Barre syndrome       Relevant Orders   Ambulatory referral to Physical Therapy      I have discontinued Ms. Guzzi's losartan. I am also having her start on losartan, nicotine, and aspirin EC. Additionally, I am having her maintain her multivitamin, hydroxychloroquine, mycophenolate, metoprolol succinate, cyclobenzaprine, and dexlansoprazole.  Meds ordered this encounter  Medications  . dexlansoprazole (DEXILANT) 60 MG capsule    Sig: Take 1 capsule (60 mg total) by mouth daily.    Dispense:  30 capsule    Refill:  5  . losartan (COZAAR) 100 MG tablet    Sig: Take 1 tablet (100 mg total) by mouth daily.    Dispense:  90 tablet    Refill:  3  . nicotine (NICOTROL) 10 MG inhaler    Sig: Inhale 1 Cartridge (1 continuous puffing total) into the lungs as needed for smoking cessation.    Dispense:  42 each    Refill:  0  . aspirin EC 81 MG tablet    Sig: Take 1 tablet (81 mg total) by mouth daily.    CMA served as Education administrator during this visit. History, Physical  and Plan performed by medical provider. Documentation and orders reviewed and attested to.  [name], DO

## 2016-08-05 NOTE — Assessment & Plan Note (Signed)
L leg weakness  Refer to pt

## 2016-08-05 NOTE — Assessment & Plan Note (Signed)
Poorly controlled will alter medications, encouraged DASH diet, minimize caffeine and obtain adequate sleep. Report concerning symptoms and follow up as directed and as needed Losartan inc to 100 mg daily

## 2016-08-05 NOTE — Assessment & Plan Note (Signed)
Pt states she is only smoking 1 cig a day Will con't to try to stop all together She said she would like to try the inhaler again

## 2016-08-20 ENCOUNTER — Ambulatory Visit (HOSPITAL_COMMUNITY)
Admission: RE | Admit: 2016-08-20 | Discharge: 2016-08-20 | Disposition: A | Discharge status: Home / Self Care | Payer: Medicare Other | Source: Ambulatory Visit | Attending: Internal Medicine | Admitting: Internal Medicine

## 2016-08-20 DIAGNOSIS — M329 Systemic lupus erythematosus, unspecified: Secondary | ICD-10-CM | POA: Diagnosis not present

## 2016-08-20 DIAGNOSIS — G609 Hereditary and idiopathic neuropathy, unspecified: Secondary | ICD-10-CM | POA: Insufficient documentation

## 2016-08-20 MED ORDER — IMMUNE GLOBULIN (HUMAN) 10 GM/100ML IJ SOLN
70.0000 g | Freq: Once | INTRAMUSCULAR | Status: AC
  Administered 2016-08-20: 70 g via INTRAVENOUS
  Filled 2016-08-20: qty 700

## 2016-08-20 MED ORDER — DEXTROSE 5 % IV SOLN
Freq: Once | INTRAVENOUS | Status: AC
  Administered 2016-08-20: 08:00:00 via INTRAVENOUS

## 2016-08-20 MED ORDER — ACETAMINOPHEN 325 MG PO TABS
ORAL_TABLET | ORAL | Status: AC
  Filled 2016-08-20: qty 2

## 2016-08-20 MED ORDER — DIPHENHYDRAMINE HCL 25 MG PO CAPS
ORAL_CAPSULE | ORAL | Status: AC
  Administered 2016-08-20: 25 mg
  Filled 2016-08-20: qty 1

## 2016-08-20 MED ORDER — ACETAMINOPHEN 325 MG PO TABS
650.0000 mg | ORAL_TABLET | Freq: Once | ORAL | Status: AC
  Administered 2016-08-20: 650 mg via ORAL

## 2016-08-20 MED ORDER — DIPHENHYDRAMINE HCL 25 MG PO CAPS: 25.0000 mg | ORAL_CAPSULE | Freq: Once | ORAL | Status: DC

## 2016-08-28 DIAGNOSIS — M3219 Other organ or system involvement in systemic lupus erythematosus: Secondary | ICD-10-CM | POA: Diagnosis not present

## 2016-08-28 DIAGNOSIS — K111 Hypertrophy of salivary gland: Secondary | ICD-10-CM | POA: Diagnosis not present

## 2016-08-28 DIAGNOSIS — Z79899 Other long term (current) drug therapy: Secondary | ICD-10-CM | POA: Diagnosis not present

## 2016-09-23 DIAGNOSIS — N281 Cyst of kidney, acquired: Secondary | ICD-10-CM | POA: Diagnosis not present

## 2016-10-15 ENCOUNTER — Inpatient Hospital Stay (HOSPITAL_COMMUNITY): Admission: RE | Admit: 2016-10-15 | Payer: Medicare Other | Source: Ambulatory Visit

## 2016-10-22 DIAGNOSIS — Z79899 Other long term (current) drug therapy: Secondary | ICD-10-CM | POA: Diagnosis not present

## 2016-10-23 DIAGNOSIS — R5382 Chronic fatigue, unspecified: Secondary | ICD-10-CM | POA: Diagnosis not present

## 2016-10-23 DIAGNOSIS — R634 Abnormal weight loss: Secondary | ICD-10-CM | POA: Diagnosis not present

## 2016-10-23 DIAGNOSIS — H0520 Unspecified exophthalmos: Secondary | ICD-10-CM | POA: Diagnosis not present

## 2016-10-23 DIAGNOSIS — M329 Systemic lupus erythematosus, unspecified: Secondary | ICD-10-CM | POA: Diagnosis not present

## 2016-10-23 DIAGNOSIS — K111 Hypertrophy of salivary gland: Secondary | ICD-10-CM | POA: Diagnosis not present

## 2016-10-23 DIAGNOSIS — R252 Cramp and spasm: Secondary | ICD-10-CM | POA: Diagnosis not present

## 2016-10-23 DIAGNOSIS — Z6823 Body mass index (BMI) 23.0-23.9, adult: Secondary | ICD-10-CM | POA: Diagnosis not present

## 2016-10-28 ENCOUNTER — Other Ambulatory Visit (HOSPITAL_COMMUNITY): Payer: Self-pay | Admitting: *Deleted

## 2016-10-29 ENCOUNTER — Encounter (HOSPITAL_COMMUNITY)
Admission: RE | Admit: 2016-10-29 | Discharge: 2016-10-29 | Disposition: A | Discharge status: Home / Self Care | Payer: Medicare Other | Source: Ambulatory Visit | Attending: Internal Medicine | Admitting: Internal Medicine

## 2016-10-29 DIAGNOSIS — M329 Systemic lupus erythematosus, unspecified: Secondary | ICD-10-CM | POA: Insufficient documentation

## 2016-10-29 DIAGNOSIS — G609 Hereditary and idiopathic neuropathy, unspecified: Secondary | ICD-10-CM | POA: Insufficient documentation

## 2016-10-29 MED ORDER — DIPHENHYDRAMINE HCL 25 MG PO CAPS
ORAL_CAPSULE | ORAL | Status: AC
  Filled 2016-10-29: qty 1

## 2016-10-29 MED ORDER — DEXTROSE 5 % IV SOLN
INTRAVENOUS | Status: AC
  Administered 2016-10-29: 09:00:00 via INTRAVENOUS

## 2016-10-29 MED ORDER — IMMUNE GLOBULIN (HUMAN) 10 GM/100ML IJ SOLN
70.0000 g | INTRAMUSCULAR | Status: DC
  Administered 2016-10-29: 70 g via INTRAVENOUS
  Filled 2016-10-29: qty 700

## 2016-10-29 MED ORDER — DIPHENHYDRAMINE HCL 25 MG PO CAPS
25.0000 mg | ORAL_CAPSULE | ORAL | Status: DC
  Administered 2016-10-29: 25 mg via ORAL

## 2016-10-29 MED ORDER — ACETAMINOPHEN 325 MG PO TABS
650.0000 mg | ORAL_TABLET | ORAL | Status: DC
  Administered 2016-10-29: 650 mg via ORAL

## 2016-10-29 MED ORDER — ACETAMINOPHEN 325 MG PO TABS
ORAL_TABLET | ORAL | Status: AC
  Filled 2016-10-29: qty 2

## 2016-12-06 DIAGNOSIS — K111 Hypertrophy of salivary gland: Secondary | ICD-10-CM | POA: Diagnosis not present

## 2016-12-06 DIAGNOSIS — R634 Abnormal weight loss: Secondary | ICD-10-CM | POA: Diagnosis not present

## 2016-12-06 DIAGNOSIS — R5382 Chronic fatigue, unspecified: Secondary | ICD-10-CM | POA: Diagnosis not present

## 2016-12-06 DIAGNOSIS — M329 Systemic lupus erythematosus, unspecified: Secondary | ICD-10-CM | POA: Diagnosis not present

## 2016-12-06 DIAGNOSIS — Z6823 Body mass index (BMI) 23.0-23.9, adult: Secondary | ICD-10-CM | POA: Diagnosis not present

## 2016-12-06 DIAGNOSIS — H0520 Unspecified exophthalmos: Secondary | ICD-10-CM | POA: Diagnosis not present

## 2016-12-06 DIAGNOSIS — R252 Cramp and spasm: Secondary | ICD-10-CM | POA: Diagnosis not present

## 2016-12-10 ENCOUNTER — Other Ambulatory Visit: Payer: Self-pay | Admitting: Family Medicine

## 2016-12-10 DIAGNOSIS — Z1231 Encounter for screening mammogram for malignant neoplasm of breast: Secondary | ICD-10-CM

## 2016-12-16 ENCOUNTER — Telehealth: Payer: Self-pay | Admitting: Family Medicine

## 2016-12-16 NOTE — Telephone Encounter (Signed)
Patient has application for disability for parking that needs to be filled out by physician. Does pt need an OV? Please advise how patient can get this paperwork filled out.

## 2016-12-17 NOTE — Telephone Encounter (Signed)
If it is New paperwork [not a recertification], then yes, patient will need face-to-face 30 minute for paperwork completion with Dr. Maryelizabeth Rowan 11/20

## 2016-12-18 NOTE — Telephone Encounter (Signed)
Scheduled 30 min appt with Lowne on 11/29.

## 2016-12-24 ENCOUNTER — Encounter (HOSPITAL_COMMUNITY)
Admission: RE | Admit: 2016-12-24 | Discharge: 2016-12-24 | Disposition: A | Discharge status: Home / Self Care | Payer: Medicare Other | Source: Ambulatory Visit | Attending: Internal Medicine | Admitting: Internal Medicine

## 2016-12-24 DIAGNOSIS — G609 Hereditary and idiopathic neuropathy, unspecified: Secondary | ICD-10-CM | POA: Diagnosis not present

## 2016-12-24 DIAGNOSIS — M329 Systemic lupus erythematosus, unspecified: Secondary | ICD-10-CM | POA: Insufficient documentation

## 2016-12-24 MED ORDER — ACETAMINOPHEN 325 MG PO TABS
650.0000 mg | ORAL_TABLET | ORAL | Status: AC
  Administered 2016-12-24: 650 mg via ORAL

## 2016-12-24 MED ORDER — ACETAMINOPHEN 325 MG PO TABS
ORAL_TABLET | ORAL | Status: AC
  Filled 2016-12-24: qty 2

## 2016-12-24 MED ORDER — DIPHENHYDRAMINE HCL 25 MG PO CAPS
25.0000 mg | ORAL_CAPSULE | ORAL | Status: AC
  Administered 2016-12-24: 25 mg via ORAL

## 2016-12-24 MED ORDER — DIPHENHYDRAMINE HCL 25 MG PO CAPS
ORAL_CAPSULE | ORAL | Status: AC
  Filled 2016-12-24: qty 1

## 2016-12-24 MED ORDER — IMMUNE GLOBULIN (HUMAN) 10 GM/100ML IJ SOLN
70.0000 g | INTRAMUSCULAR | Status: AC
  Administered 2016-12-24: 70 g via INTRAVENOUS
  Filled 2016-12-24: qty 600
  Filled 2016-12-24: qty 700

## 2016-12-26 ENCOUNTER — Ambulatory Visit: Payer: Medicare Other | Admitting: Family Medicine

## 2017-01-06 ENCOUNTER — Ambulatory Visit: Payer: Medicare Other

## 2017-01-09 ENCOUNTER — Other Ambulatory Visit: Payer: Self-pay | Admitting: Family Medicine

## 2017-01-09 DIAGNOSIS — M62838 Other muscle spasm: Secondary | ICD-10-CM

## 2017-01-31 ENCOUNTER — Ambulatory Visit
Admission: RE | Admit: 2017-01-31 | Discharge: 2017-01-31 | Disposition: A | Discharge status: Home / Self Care | Payer: Medicare Other | Source: Ambulatory Visit | Attending: Family Medicine | Admitting: Family Medicine

## 2017-01-31 DIAGNOSIS — Z1231 Encounter for screening mammogram for malignant neoplasm of breast: Secondary | ICD-10-CM

## 2017-02-04 ENCOUNTER — Ambulatory Visit: Payer: Medicare Other | Admitting: Family Medicine

## 2017-02-10 ENCOUNTER — Ambulatory Visit: Payer: Medicare Other | Admitting: Family Medicine

## 2017-02-11 ENCOUNTER — Encounter: Payer: Self-pay | Admitting: Family Medicine

## 2017-02-11 ENCOUNTER — Ambulatory Visit (INDEPENDENT_AMBULATORY_CARE_PROVIDER_SITE_OTHER): Payer: Medicare Other | Admitting: Family Medicine

## 2017-02-11 VITALS — BP 138/78 | HR 81 | Temp 98.2°F | Resp 16 | Ht 67.0 in | Wt 159.2 lb

## 2017-02-11 DIAGNOSIS — I10 Essential (primary) hypertension: Secondary | ICD-10-CM | POA: Diagnosis not present

## 2017-02-11 DIAGNOSIS — K219 Gastro-esophageal reflux disease without esophagitis: Secondary | ICD-10-CM

## 2017-02-11 DIAGNOSIS — E785 Hyperlipidemia, unspecified: Secondary | ICD-10-CM | POA: Insufficient documentation

## 2017-02-11 DIAGNOSIS — Z720 Tobacco use: Secondary | ICD-10-CM

## 2017-02-11 LAB — LIPID PANEL
Cholesterol: 198 mg/dL (ref 0–200)
HDL: 60.6 mg/dL (ref >39.00)
LDL Cholesterol: 119 mg/dL — ABNORMAL HIGH (ref 0–99)
NonHDL: 137.17
Total CHOL/HDL Ratio: 3
Triglycerides: 89 mg/dL (ref 0.0–149.0)
VLDL: 17.8 mg/dL (ref 0.0–40.0)

## 2017-02-11 LAB — COMPREHENSIVE METABOLIC PANEL
ALT: 30 U/L (ref 0–35)
AST: 35 U/L (ref 0–37)
Albumin: 3.9 g/dL (ref 3.5–5.2)
Alkaline Phosphatase: 65 U/L (ref 39–117)
BUN: 15 mg/dL (ref 6–23)
CO2: 29 mEq/L (ref 19–32)
Calcium: 9 mg/dL (ref 8.4–10.5)
Chloride: 106 mEq/L (ref 96–112)
Creatinine, Ser: 0.92 mg/dL (ref 0.40–1.20)
GFR: 81.31 mL/min (ref >60.00)
Glucose, Bld: 98 mg/dL (ref 70–99)
Potassium: 5.2 mEq/L — ABNORMAL HIGH (ref 3.5–5.1)
Sodium: 139 mEq/L (ref 135–145)
Total Bilirubin: 0.3 mg/dL (ref 0.2–1.2)
Total Protein: 7.2 g/dL (ref 6.0–8.3)

## 2017-02-11 MED ORDER — DEXLANSOPRAZOLE 60 MG PO CPDR: 60.0000 mg | DELAYED_RELEASE_CAPSULE | Freq: Every day | ORAL | 3 refills | Status: DC

## 2017-02-11 NOTE — Assessment & Plan Note (Signed)
Tolerating statin, encouraged heart healthy diet, avoid trans fats, minimize simple carbs and saturated fats. Increase exercise as tolerated 

## 2017-02-11 NOTE — Progress Notes (Signed)
Patient ID: Samantha [name], female   DOB: [birth year], 57 y.o.   MRN: [id number]     Subjective:  I acted as a Education administrator for Dr. Carollee Herter.  Guerry Bruin, [place]   Patient ID: Samantha [name], female    DOB: 1960-06-24, 57 y.o.   MRN: [id number]  Chief Complaint  Patient presents with  . Hypertension    HPI  Patient is in today for follow up blood pressure and cholesterol.  No complaints.   Patient Care Team: Carollee Herter, Alferd Apa, DO as PCP - General (Family Medicine) Candee Furbish, MD as Referring Physician (Rheumatology) Inda Castle, MD (Inactive) as Consulting Physician (Gastroenterology) Melissa Noon, [place] as Referring Physician (Optometry)   Past Medical History:  Diagnosis Date  . Altered mental status   . Backache, unspecified   . Gastric ulcer, unspecified as acute or chronic, without mention of hemorrhage, perforation, or obstruction   . Helicobacter pylori (H. pylori)   . Tobacco abuse   . Unspecified essential hypertension     Past Surgical History:  Procedure Laterality Date  . UPPER GASTROINTESTINAL ENDOSCOPY  2013    Family History  Problem Relation Age of Onset  . Diabetes Sister   . Hypertension Mother   . Colon cancer Neg Hx   . Stomach cancer Neg Hx     Social History   Socioeconomic History  . Marital status: Single    Spouse name: Not on file  . Number of children: Not on file  . Years of education: Not on file  . Highest education level: Not on file  Social Needs  . Financial resource strain: Not on file  . Food insecurity - worry: Not on file  . Food insecurity - inability: Not on file  . Transportation needs - medical: Not on file  . Transportation needs - non-medical: Not on file  Occupational History  . Occupation: permanant disability    Employer: [employer]  Tobacco Use  . Smoking status: Current Every Day Smoker    Packs/day: 0.01    Years: 35.00    Pack years: 0.35    Types: Cigarettes  . Smokeless tobacco: Never Used    . Tobacco comment: 3-5 cig a day  Substance and Sexual Activity  . Alcohol use: Yes    Comment: occassional; beer   . Drug use: No  . Sexual activity: Not Currently    Partners: Male  Other Topics Concern  . Not on file  Social History Narrative   Exercise--- walking    Outpatient Medications Prior to Visit  Medication Sig Dispense Refill  . aspirin EC 81 MG tablet Take 1 tablet (81 mg total) by mouth daily.    . cyclobenzaprine (FLEXERIL) 10 MG tablet TAKE 1 TABLET(10 MG) BY MOUTH THREE TIMES DAILY AS NEEDED 30 tablet 0  . hydroxychloroquine (PLAQUENIL) 200 MG tablet Take 200 mg by mouth 2 (two) times daily.    Marland Kitchen losartan (COZAAR) 100 MG tablet Take 1 tablet (100 mg total) by mouth daily. 90 tablet 3  . metoprolol succinate (TOPROL-XL) 100 MG 24 hr tablet TAKE 1 TABLET BY MOUTH DAILY WITH OR IMMEDIATELY FOLLOWING A MEAL 90 tablet 0  . Multiple Vitamin (MULTIVITAMIN) tablet Take 1 tablet by mouth daily.    . mycophenolate (CELLCEPT) 500 MG tablet Take 1,500 mg by mouth 2 (two) times daily.     . nicotine (NICOTROL) 10 MG inhaler Inhale 1 Cartridge (1 continuous puffing total) into the lungs as needed for smoking cessation.  42 each 0  . dexlansoprazole (DEXILANT) 60 MG capsule Take 1 capsule (60 mg total) by mouth daily. 30 capsule 5   No facility-administered medications prior to visit.     Allergies  Allergen Reactions  . Tramadol Nausea Only    Feels weirded out    Review of Systems  Constitutional: Negative for fever and malaise/fatigue.  HENT: Negative for congestion.   Eyes: Negative for blurred vision.  Respiratory: Negative for shortness of breath.   Cardiovascular: Negative for chest pain, palpitations and leg swelling.  Gastrointestinal: Negative for abdominal pain, blood in stool and nausea.  Genitourinary: Negative for dysuria and frequency.  Musculoskeletal: Negative for falls.  Skin: Negative for rash.  Neurological: Negative for dizziness, loss of  consciousness and headaches.  Endo/Heme/Allergies: Negative for environmental allergies.  Psychiatric/Behavioral: Negative for depression. The patient is not nervous/anxious.        Objective:    Physical Exam  Constitutional: She is oriented to person, place, and time. She appears well-developed and well-nourished.  HENT:  Head: Normocephalic and atraumatic.  Eyes: Conjunctivae and EOM are normal.  Neck: Normal range of motion. Neck supple. No JVD present. Carotid bruit is not present. No thyromegaly present.  Cardiovascular: Normal rate, regular rhythm and normal heart sounds.  No murmur heard. Pulmonary/Chest: Effort normal and breath sounds normal. No respiratory distress. She has no wheezes. She has no rales. She exhibits no tenderness.  Musculoskeletal: She exhibits no edema.  Neurological: She is alert and oriented to person, place, and time.  Psychiatric: She has a normal mood and affect.  Nursing note and vitals reviewed.   BP 138/78 (BP Location: Left Arm, Cuff Size: Normal)   Pulse 81   Temp 98.2 F (36.8 C) (Oral)   Resp 16   Ht 5\' 7"  (1.702 m)   Wt 159 lb 3.2 oz (72.2 kg)   LMP 04/11/2011   SpO2 98%   BMI 24.93 kg/m  Wt Readings from Last 3 Encounters:  02/11/17 159 lb 3.2 oz (72.2 kg)  12/24/16 160 lb (72.6 kg)  10/29/16 160 lb (72.6 kg)   BP Readings from Last 3 Encounters:  02/11/17 138/78  12/24/16 (!) 181/92  10/29/16 (!) 157/93     Immunization History  Administered Date(s) Administered  . Tdap 09/15/2012    Health Maintenance  Topic Date Due  . COLONOSCOPY  03/14/2017  . PAP SMEAR  12/25/2018  . MAMMOGRAM  02/01/2019  . TETANUS/TDAP  09/16/2022  . Hepatitis C Screening  Completed  . HIV Screening  Completed    Lab Results  Component Value Date   WBC 4.2 06/18/2016   HGB 12.6 06/18/2016   HCT 39.4 06/18/2016   PLT 241.0 06/18/2016   GLUCOSE 90 06/18/2016   CHOL 195 12/25/2015   TRIG 86.0 12/25/2015   HDL 52.50 12/25/2015    LDLDIRECT 141.8 09/15/2012   LDLCALC 125 (H) 12/25/2015   ALT 25 06/18/2016   AST 28 06/18/2016   NA 140 06/18/2016   K 4.0 06/18/2016   CL 108 06/18/2016   CREATININE 0.71 06/18/2016   BUN 13 06/18/2016   CO2 23 06/18/2016   TSH 0.83 09/15/2012   INR 1.14 02/26/2011   HGBA1C 5.6 03/13/2011    Lab Results  Component Value Date   TSH 0.83 09/15/2012   Lab Results  Component Value Date   WBC 4.2 06/18/2016   HGB 12.6 06/18/2016   HCT 39.4 06/18/2016   MCV 89.5 06/18/2016   PLT 241.0 06/18/2016  Lab Results  Component Value Date   NA 140 06/18/2016   K 4.0 06/18/2016   CO2 23 06/18/2016   GLUCOSE 90 06/18/2016   BUN 13 06/18/2016   CREATININE 0.71 06/18/2016   BILITOT 0.2 06/18/2016   ALKPHOS 74 06/18/2016   AST 28 06/18/2016   ALT 25 06/18/2016   PROT 7.4 06/18/2016   ALBUMIN 4.0 06/18/2016   CALCIUM 9.2 06/18/2016   ANIONGAP 9 12/13/2015   GFR 109.91 06/18/2016   Lab Results  Component Value Date   CHOL 195 12/25/2015   Lab Results  Component Value Date   HDL 52.50 12/25/2015   Lab Results  Component Value Date   LDLCALC 125 (H) 12/25/2015   Lab Results  Component Value Date   TRIG 86.0 12/25/2015   Lab Results  Component Value Date   CHOLHDL 4 12/25/2015   Lab Results  Component Value Date   HGBA1C 5.6 03/13/2011         Assessment & Plan:   Problem List Items Addressed This Visit      Unprioritized   Gastroesophageal reflux disease without esophagitis   Relevant Medications   dexlansoprazole (DEXILANT) 60 MG capsule   HTN (hypertension)    Well controlled, no changes to meds. Encouraged heart healthy diet such as the DASH diet and exercise as tolerated.       Relevant Orders   Comprehensive metabolic panel   Lipid panel   Hyperlipidemia LDL goal <100 - Primary    Tolerating statin, encouraged heart healthy diet, avoid trans fats, minimize simple carbs and saturated fats. Increase exercise as tolerated      Relevant Orders     Comprehensive metabolic panel   Lipid panel    Other Visit Diagnoses    Tobacco use       Relevant Orders   Ambulatory Referral for Lung Cancer Scre      I am having Inez Pilgrim. Quentin Cornwall maintain her multivitamin, hydroxychloroquine, mycophenolate, metoprolol succinate, losartan, nicotine, aspirin EC, cyclobenzaprine, and dexlansoprazole.  Meds ordered this encounter  Medications  . dexlansoprazole (DEXILANT) 60 MG capsule    Sig: Take 1 capsule (60 mg total) by mouth daily.    Dispense:  90 capsule    Refill:  3    CMA served as scribe during this visit. History, Physical and Plan performed by medical provider. Documentation and orders reviewed and attested to.  [name], DO

## 2017-02-11 NOTE — Progress Notes (Deleted)
Subjective:   Samantha [name] is a 57 y.o. female who presents for Medicare Annual (Subsequent) preventive examination.  Review of Systems: No ROS.  Medicare Wellness Visit. Additional risk factors are reflected in the social history.   Sleep patterns:  Home Safety/Smoke Alarms: Feels safe in home. Smoke alarms in place.   Female:   Pap- last 12/25/15: normal       Mammo- last 01/31/17-normal       Dexa scan-        CCS-last 03/14/14: recall 3 yrs  Objective:     Vitals: LMP 04/11/2011   There is no height or weight on file to calculate BMI.  Advanced Directives 12/25/2015 12/13/2015 03/14/2014 02/28/2014 03/09/2011 02/26/2011  Does Patient Have a Medical Advance Directive? No No No No Patient has advance directive, copy not in chart Patient has advance directive, copy not in chart  Type of Advance Directive - - - - Healthcare Power of [place];Living will  Copy of [place] in Chart? - - - - Copy requested from family Copy requested from family  Would patient like information on creating a medical advance directive? Yes (MAU/Ambulatory/Procedural Areas - Information given) No - patient declined information No - patient declined information - - -  Pre-existing out of facility DNR order (yellow form or pink MOST form) - - - - No No    Tobacco Social History   Tobacco Use  Smoking Status Current Every Day Smoker  . Packs/day: 0.01  . Years: 35.00  . Pack years: 0.35  . Types: Cigarettes  Smokeless Tobacco Never Used  Tobacco Comment   3-5 cig a day     Ready to quit: Not Answered Counseling given: Not Answered Comment: 3-5 cig a day   Clinical Intake:                       Past Medical History:  Diagnosis Date  . Altered mental status   . Backache, unspecified   . Gastric ulcer, unspecified as acute or chronic, without mention of hemorrhage, perforation, or obstruction   . Helicobacter pylori (H. pylori)     . Tobacco abuse   . Unspecified essential hypertension    Past Surgical History:  Procedure Laterality Date  . UPPER GASTROINTESTINAL ENDOSCOPY  2013   Family History  Problem Relation Age of Onset  . Diabetes Sister   . Hypertension Mother   . Colon cancer Neg Hx   . Stomach cancer Neg Hx    Social History   Socioeconomic History  . Marital status: Single    Spouse name: Not on file  . Number of children: Not on file  . Years of education: Not on file  . Highest education level: Not on file  Social Needs  . Financial resource strain: Not on file  . Food insecurity - worry: Not on file  . Food insecurity - inability: Not on file  . Transportation needs - medical: Not on file  . Transportation needs - non-medical: Not on file  Occupational History  . Occupation: permanant disability    Employer: [employer]  Tobacco Use  . Smoking status: Current Every Day Smoker    Packs/day: 0.01    Years: 35.00    Pack years: 0.35    Types: Cigarettes  . Smokeless tobacco: Never Used  . Tobacco comment: 3-5 cig a day  Substance and Sexual Activity  . Alcohol use: Yes  Comment: occassional; beer   . Drug use: No  . Sexual activity: Not Currently    Partners: Male  Other Topics Concern  . Not on file  Social History Narrative   Exercise--- walking    Outpatient Encounter Medications as of 02/14/2017  Medication Sig  . aspirin EC 81 MG tablet Take 1 tablet (81 mg total) by mouth daily.  . cyclobenzaprine (FLEXERIL) 10 MG tablet TAKE 1 TABLET(10 MG) BY MOUTH THREE TIMES DAILY AS NEEDED  . dexlansoprazole (DEXILANT) 60 MG capsule Take 1 capsule (60 mg total) by mouth daily.  . hydroxychloroquine (PLAQUENIL) 200 MG tablet Take 200 mg by mouth 2 (two) times daily.  Marland Kitchen losartan (COZAAR) 100 MG tablet Take 1 tablet (100 mg total) by mouth daily.  . metoprolol succinate (TOPROL-XL) 100 MG 24 hr tablet TAKE 1 TABLET BY MOUTH DAILY WITH OR IMMEDIATELY FOLLOWING A MEAL  . Multiple  Vitamin (MULTIVITAMIN) tablet Take 1 tablet by mouth daily.  . mycophenolate (CELLCEPT) 500 MG tablet Take 1,500 mg by mouth 2 (two) times daily.   . nicotine (NICOTROL) 10 MG inhaler Inhale 1 Cartridge (1 continuous puffing total) into the lungs as needed for smoking cessation.   No facility-administered encounter medications on file as of 02/14/2017.     Activities of Daily Living In your present state of health, do you have any difficulty performing the following activities: 02/20/2016  Hearing? N  Vision? N  Difficulty concentrating or making decisions? N  Walking or climbing stairs? N  Dressing or bathing? N  Some recent data might be hidden    Patient Care Team: Carollee Herter, Alferd Apa, DO as PCP - General (Family Medicine) Candee Furbish, MD as Referring Physician (Rheumatology) Inda Castle, MD (Inactive) as Consulting Physician (Gastroenterology) Melissa Noon, [place] as Referring Physician (Optometry)    Assessment:   This is a routine wellness examination for Samantha [name].  Exercise Activities and Dietary recommendations    Goals    . Quit smoking / using tobacco       Fall Risk Fall Risk  07/25/2014  Falls in the past year? No    Depression Screen PHQ 2/9 Scores 02/11/2017 01/26/2016 07/25/2014 09/15/2012  PHQ - 2 Score 0 0 0 0     Cognitive Function        Immunization History  Administered Date(s) Administered  . Tdap 09/15/2012    Screening Tests Health Maintenance  Topic Date Due  . COLONOSCOPY  03/14/2017  . PAP SMEAR  12/25/2018  . MAMMOGRAM  02/01/2019  . TETANUS/TDAP  09/16/2022  . Hepatitis C Screening  Completed  . HIV Screening  Completed      Plan:   ***   I have personally reviewed and noted the following in the patient's chart:   . Medical and social history . Use of alcohol, tobacco or illicit drugs  . Current medications and supplements . Functional ability and status . Nutritional status . Physical activity . Advanced  directives . List of other physicians . Hospitalizations, surgeries, and ER visits in previous 12 months . Vitals . Screenings to include cognitive, depression, and falls . Referrals and appointments  In addition, I have reviewed and discussed with patient certain preventive protocols, quality metrics, and best practice recommendations. A written personalized care plan for preventive services as well as general preventive health recommendations were provided to patient.     Shela Nevin, [place]  02/11/2017

## 2017-02-11 NOTE — Patient Instructions (Signed)

## 2017-02-11 NOTE — Assessment & Plan Note (Signed)
Well controlled, no changes to meds. Encouraged heart healthy diet such as the DASH diet and exercise as tolerated.  °

## 2017-02-14 ENCOUNTER — Ambulatory Visit: Payer: Medicare Other | Admitting: *Deleted

## 2017-02-17 ENCOUNTER — Other Ambulatory Visit (HOSPITAL_COMMUNITY): Payer: Self-pay | Admitting: *Deleted

## 2017-02-17 ENCOUNTER — Other Ambulatory Visit: Payer: Self-pay | Admitting: Acute Care

## 2017-02-17 DIAGNOSIS — Z122 Encounter for screening for malignant neoplasm of respiratory organs: Secondary | ICD-10-CM

## 2017-02-17 DIAGNOSIS — F17210 Nicotine dependence, cigarettes, uncomplicated: Secondary | ICD-10-CM

## 2017-02-18 ENCOUNTER — Encounter (HOSPITAL_COMMUNITY)
Admission: RE | Admit: 2017-02-18 | Discharge: 2017-02-18 | Disposition: A | Discharge status: Home / Self Care | Payer: Medicare Other | Source: Ambulatory Visit | Attending: Internal Medicine | Admitting: Internal Medicine

## 2017-02-18 DIAGNOSIS — G609 Hereditary and idiopathic neuropathy, unspecified: Secondary | ICD-10-CM | POA: Insufficient documentation

## 2017-02-18 DIAGNOSIS — M329 Systemic lupus erythematosus, unspecified: Secondary | ICD-10-CM | POA: Insufficient documentation

## 2017-02-18 MED ORDER — DEXTROSE 5 % IV SOLN: Freq: Once | INTRAVENOUS | Status: DC

## 2017-02-18 MED ORDER — ACETAMINOPHEN 325 MG PO TABS
ORAL_TABLET | ORAL | Status: AC
  Filled 2017-02-18: qty 2

## 2017-02-18 MED ORDER — IMMUNE GLOBULIN (HUMAN) 1 GM/10ML IJ SOLN: 70.0000 g | Freq: Once | INTRAMUSCULAR | Status: DC

## 2017-02-18 MED ORDER — DIPHENHYDRAMINE HCL 25 MG PO CAPS: 25.0000 mg | ORAL_CAPSULE | Freq: Once | ORAL | Status: DC

## 2017-02-18 MED ORDER — ACETAMINOPHEN 325 MG PO TABS: 650.0000 mg | ORAL_TABLET | Freq: Once | ORAL | Status: DC

## 2017-02-18 MED ORDER — DIPHENHYDRAMINE HCL 25 MG PO CAPS
ORAL_CAPSULE | ORAL | Status: AC
  Filled 2017-02-18: qty 1

## 2017-02-25 ENCOUNTER — Telehealth: Payer: Self-pay | Admitting: Acute Care

## 2017-02-26 ENCOUNTER — Ambulatory Visit (INDEPENDENT_AMBULATORY_CARE_PROVIDER_SITE_OTHER)
Admission: RE | Admit: 2017-02-26 | Discharge: 2017-02-26 | Disposition: A | Discharge status: Home / Self Care | Payer: Medicare Other | Source: Ambulatory Visit | Attending: Acute Care | Admitting: Acute Care

## 2017-02-26 ENCOUNTER — Ambulatory Visit (INDEPENDENT_AMBULATORY_CARE_PROVIDER_SITE_OTHER): Payer: Medicare Other | Admitting: Acute Care

## 2017-02-26 ENCOUNTER — Encounter: Payer: Self-pay | Admitting: Acute Care

## 2017-02-26 DIAGNOSIS — F17210 Nicotine dependence, cigarettes, uncomplicated: Secondary | ICD-10-CM | POA: Diagnosis not present

## 2017-02-26 DIAGNOSIS — Z122 Encounter for screening for malignant neoplasm of respiratory organs: Secondary | ICD-10-CM

## 2017-02-26 NOTE — Telephone Encounter (Signed)
Sarah, please advise if pt was able to make it to her Shared Decision visit and also for the CT scan.  Thanks!

## 2017-02-26 NOTE — Progress Notes (Signed)
Shared Decision Making Visit Lung Cancer Screening Program [phone])   Eligibility:  Age 57 y.o.  Pack Years Smoking History Calculation 30 pack year smoking history (# packs/per year x # years smoked)  Recent History of coughing up blood  no  Unexplained weight loss? no ( >Than 15 pounds within the last 6 months )  Prior History Lung / other cancer no (Diagnosis within the last 5 years already requiring surveillance chest CT Scans).  Smoking Status Current Smoker  Former Smokers: Years since quit: NA  Quit Date: NA  Visit Components:  Discussion included one or more decision making aids. yes  Discussion included risk/benefits of screening. yes  Discussion included potential follow up diagnostic testing for abnormal scans. yes  Discussion included meaning and risk of over diagnosis. yes  Discussion included meaning and risk of False Positives. yes  Discussion included meaning of total radiation exposure. yes  Counseling Included:  Importance of adherence to annual lung cancer LDCT screening. yes  Impact of comorbidities on ability to participate in the program. yes  Ability and willingness to under diagnostic treatment. yes  Smoking Cessation Counseling:  Current Smokers:   Discussed importance of smoking cessation. yes  Information about tobacco cessation classes and interventions provided to patient. yes  Patient provided with "ticket" for LDCT Scan. yes  Symptomatic Patient. no  Counseling  Diagnosis Code: Tobacco Use Z72.0  Asymptomatic Patient yes  Counseling (Intermediate counseling: > three minutes counseling) [id number]  Former Smokers:   Discussed the importance of maintaining cigarette abstinence. yes  Diagnosis Code: Personal History of Nicotine Dependence. G40.102  Information about tobacco cessation classes and interventions provided to patient. Yes  Patient provided with "ticket" for LDCT Scan. yes  Written Order for Lung Cancer  Screening with LDCT placed in Epic. Yes (CT Chest Lung Cancer Screening Low Dose W/O CM) [id number] Z12.2-Screening of respiratory organs Z87.891-Personal history of nicotine dependence  I have spent 56 minutes of face to face time with Ms. Veazey discussing the risks and benefits of lung cancer screening. We viewed a power point together that explained in detail the above noted topics. We paused at intervals to allow for questions to be asked and answered to ensure understanding.We discussed that the single most powerful action that she can take to decrease her risk of developing lung cancer is to quit smoking. We discussed whether or not she is ready to commit to setting a quit date. We discussed options for tools to aid in quitting smoking including nicotine replacement therapy, non-nicotine medications, support groups, Quit Smart classes, and behavior modification. We discussed that often times setting smaller, more achievable goals, such as eliminating 1 cigarette a day for a week and then 2 cigarettes a day for a week can be helpful in slowly decreasing the number of cigarettes smoked. This allows for a sense of accomplishment as well as providing a clinical benefit. I gave her the " Be Stronger Than Your Excuses" card with contact information for community resources, classes, free nicotine replacement therapy, and access to mobile apps, text messaging, and on-line smoking cessation help. I have also given her my card and contact information in the event she needs to contact me. We discussed the time and location of the scan, and that either Doroteo Glassman RN or I will call with the results within 24-48 hours of receiving them. I have offered her  a copy of the power point we viewed  as a resource in the event they need reinforcement of  the concepts we discussed today in the office. The patient verbalized understanding of all of  the above and had no further questions upon leaving the office. They have my  contact information in the event they have any further questions.  I spent 3 minutes counseling on smoking cessation and the health risks of continued tobacco abuse.  I explained to the patient that there has been a high incidence of coronary artery disease noted on these exams. I explained that this is a non-gated exam therefore degree or severity cannot be determined. This patient is not on statin therapy. I have asked the patient to follow-up with their PCP regarding any incidental finding of coronary artery disease and management with diet or medication as their PCP  feels is clinically indicated. The patient verbalized understanding of the above and had no further questions upon completion of the visit.      Magdalen Spatz, NP 02/26/2017

## 2017-02-26 NOTE — Telephone Encounter (Signed)
Is fine to re-schedule. Have her call Erline Levine at [phone] to re-schedule. Thanks

## 2017-02-26 NOTE — Telephone Encounter (Signed)
Spoke with patient She did make it for both her SDMV and LDCT  Pt aware we will call her once SG views her LDCT results Nothing further needed; will sign off

## 2017-02-26 NOTE — Telephone Encounter (Signed)
Pt returning call. Cb is [phone].

## 2017-02-26 NOTE — Telephone Encounter (Signed)
Attempted to call pt but unable to reach pt.  Left message for pt to return our call x1

## 2017-03-04 DIAGNOSIS — R634 Abnormal weight loss: Secondary | ICD-10-CM | POA: Diagnosis not present

## 2017-03-04 DIAGNOSIS — R252 Cramp and spasm: Secondary | ICD-10-CM | POA: Diagnosis not present

## 2017-03-04 DIAGNOSIS — H0520 Unspecified exophthalmos: Secondary | ICD-10-CM | POA: Diagnosis not present

## 2017-03-04 DIAGNOSIS — M329 Systemic lupus erythematosus, unspecified: Secondary | ICD-10-CM | POA: Diagnosis not present

## 2017-03-04 DIAGNOSIS — Z6824 Body mass index (BMI) 24.0-24.9, adult: Secondary | ICD-10-CM | POA: Diagnosis not present

## 2017-03-04 DIAGNOSIS — K111 Hypertrophy of salivary gland: Secondary | ICD-10-CM | POA: Diagnosis not present

## 2017-03-04 DIAGNOSIS — R5382 Chronic fatigue, unspecified: Secondary | ICD-10-CM | POA: Diagnosis not present

## 2017-03-07 ENCOUNTER — Other Ambulatory Visit: Payer: Self-pay | Admitting: Acute Care

## 2017-03-07 DIAGNOSIS — Z122 Encounter for screening for malignant neoplasm of respiratory organs: Secondary | ICD-10-CM

## 2017-03-07 DIAGNOSIS — F17210 Nicotine dependence, cigarettes, uncomplicated: Secondary | ICD-10-CM

## 2017-03-11 ENCOUNTER — Encounter (HOSPITAL_COMMUNITY): Payer: Medicare Other

## 2017-03-11 ENCOUNTER — Encounter: Payer: Self-pay | Admitting: Gastroenterology

## 2017-03-31 ENCOUNTER — Encounter (HOSPITAL_COMMUNITY): Payer: Medicare Other

## 2017-04-07 ENCOUNTER — Other Ambulatory Visit (HOSPITAL_COMMUNITY): Payer: Self-pay | Admitting: *Deleted

## 2017-04-08 ENCOUNTER — Ambulatory Visit (HOSPITAL_COMMUNITY)
Admission: RE | Admit: 2017-04-08 | Discharge: 2017-04-08 | Disposition: A | Discharge status: Home / Self Care | Payer: Medicare Other | Source: Ambulatory Visit | Attending: Internal Medicine | Admitting: Internal Medicine

## 2017-04-08 DIAGNOSIS — M329 Systemic lupus erythematosus, unspecified: Secondary | ICD-10-CM | POA: Insufficient documentation

## 2017-04-08 DIAGNOSIS — G908 Other disorders of autonomic nervous system: Secondary | ICD-10-CM | POA: Insufficient documentation

## 2017-04-08 MED ORDER — ACETAMINOPHEN 325 MG PO TABS
ORAL_TABLET | ORAL | Status: AC
  Administered 2017-04-08: 650 mg via ORAL
  Filled 2017-04-08: qty 2

## 2017-04-08 MED ORDER — DIPHENHYDRAMINE HCL 25 MG PO CAPS
25.0000 mg | ORAL_CAPSULE | ORAL | Status: DC
  Administered 2017-04-08: 25 mg via ORAL

## 2017-04-08 MED ORDER — DIPHENHYDRAMINE HCL 25 MG PO CAPS
ORAL_CAPSULE | ORAL | Status: AC
  Administered 2017-04-08: 25 mg via ORAL
  Filled 2017-04-08: qty 1

## 2017-04-08 MED ORDER — DEXTROSE 5 % IV SOLN
INTRAVENOUS | Status: DC
  Administered 2017-04-08: 09:00:00 via INTRAVENOUS

## 2017-04-08 MED ORDER — IMMUNE GLOBULIN (HUMAN) 1 GM/10ML IJ SOLN
70.0000 g | INTRAMUSCULAR | Status: DC
  Filled 2017-04-08: qty 700

## 2017-04-08 MED ORDER — IMMUNE GLOBULIN (HUMAN) 1 GM/10ML IJ SOLN
70.0000 g | INTRAMUSCULAR | Status: DC
  Administered 2017-04-08: 70 g via INTRAVENOUS
  Filled 2017-04-08 (×2): qty 700

## 2017-04-08 MED ORDER — ACETAMINOPHEN 325 MG PO TABS
650.0000 mg | ORAL_TABLET | ORAL | Status: DC
  Administered 2017-04-08: 650 mg via ORAL

## 2017-06-02 ENCOUNTER — Other Ambulatory Visit (HOSPITAL_COMMUNITY): Payer: Self-pay

## 2017-06-03 ENCOUNTER — Ambulatory Visit (HOSPITAL_COMMUNITY)
Admission: RE | Admit: 2017-06-03 | Discharge: 2017-06-03 | Disposition: A | Discharge status: Home / Self Care | Payer: Medicare Other | Source: Ambulatory Visit | Attending: Internal Medicine | Admitting: Internal Medicine

## 2017-06-03 DIAGNOSIS — G609 Hereditary and idiopathic neuropathy, unspecified: Secondary | ICD-10-CM | POA: Diagnosis not present

## 2017-06-03 DIAGNOSIS — D8989 Other specified disorders involving the immune mechanism, not elsewhere classified: Secondary | ICD-10-CM | POA: Insufficient documentation

## 2017-06-03 DIAGNOSIS — M329 Systemic lupus erythematosus, unspecified: Secondary | ICD-10-CM | POA: Insufficient documentation

## 2017-06-03 DIAGNOSIS — G619 Inflammatory polyneuropathy, unspecified: Secondary | ICD-10-CM | POA: Insufficient documentation

## 2017-06-03 MED ORDER — IMMUNE GLOBULIN (HUMAN) 1 GM/10ML IJ SOLN
70.0000 g | INTRAMUSCULAR | Status: DC
  Filled 2017-06-03: qty 700

## 2017-06-03 MED ORDER — ACETAMINOPHEN 325 MG PO TABS
650.0000 mg | ORAL_TABLET | ORAL | Status: DC
  Administered 2017-06-03: 650 mg via ORAL

## 2017-06-03 MED ORDER — DEXTROSE 5 % IV SOLN
INTRAVENOUS | Status: DC
  Administered 2017-06-03: 09:00:00 via INTRAVENOUS

## 2017-06-03 MED ORDER — IMMUNE GLOBULIN (HUMAN) 10 GM/100ML IJ SOLN
70.0000 g | Freq: Once | INTRAMUSCULAR | Status: AC
  Administered 2017-06-03: 70 g via INTRAVENOUS
  Filled 2017-06-03: qty 700

## 2017-06-03 MED ORDER — IMMUNE GLOBULIN (HUMAN) 1 GM/10ML IJ SOLN
70.0000 g | INTRAMUSCULAR | Status: DC
  Filled 2017-06-03 (×2): qty 700

## 2017-06-03 MED ORDER — ACETAMINOPHEN 325 MG PO TABS
ORAL_TABLET | ORAL | Status: AC
  Filled 2017-06-03: qty 2

## 2017-06-03 MED ORDER — DIPHENHYDRAMINE HCL 25 MG PO CAPS
25.0000 mg | ORAL_CAPSULE | ORAL | Status: DC
  Administered 2017-06-03: 25 mg via ORAL

## 2017-06-03 MED ORDER — DIPHENHYDRAMINE HCL 25 MG PO CAPS
ORAL_CAPSULE | ORAL | Status: AC
  Filled 2017-06-03: qty 1

## 2017-07-14 DIAGNOSIS — M329 Systemic lupus erythematosus, unspecified: Secondary | ICD-10-CM | POA: Diagnosis not present

## 2017-07-14 DIAGNOSIS — G6189 Other inflammatory polyneuropathies: Secondary | ICD-10-CM | POA: Diagnosis not present

## 2017-07-14 DIAGNOSIS — Z79899 Other long term (current) drug therapy: Secondary | ICD-10-CM | POA: Diagnosis not present

## 2017-07-29 ENCOUNTER — Inpatient Hospital Stay (HOSPITAL_COMMUNITY): Admission: RE | Admit: 2017-07-29 | Payer: Medicare Other | Source: Ambulatory Visit

## 2017-08-06 NOTE — Progress Notes (Signed)
Subjective:   Samantha [name] is a 57 y.o. female who presents for Medicare Annual (Subsequent) preventive examination.  Review of Systems: No ROS.  Medicare Wellness Visit. Additional risk factors are reflected in the social history.  Sleep patterns:  Home Safety/Smoke Alarms: Feels safe in home. Smoke alarms in place.  Living environment; residence and Firearm Safety: Lives in 1 story home with mother.   Female:   Pap- 11/2015      Mammo-  utd     Dexa scan-  declines      CCS-Due 03/2019     Objective:     Vitals: BP (!) 142/58 (BP Location: Left Arm, Patient Position: Sitting, Cuff Size: Normal)   Pulse 87   Ht 5\' 7"  (1.702 m)   Wt 162 lb (73.5 kg)   LMP 04/11/2011   SpO2 97%   BMI 25.37 kg/m   Body mass index is 25.37 kg/m.  Advanced Directives 08/12/2017 12/25/2015 12/13/2015 03/14/2014 02/28/2014 03/09/2011 02/26/2011  Does Patient Have a Medical Advance Directive? Yes No No No No Patient has advance directive, copy not in chart Patient has advance directive, copy not in chart  Type of Advance Directive [place];Living will - - - - Special educational needs teacher of [place];Living will  Copy of [place] in Chart? Yes - - - - Copy requested from family Copy requested from family  Would patient like information on creating a medical advance directive? - Yes (MAU/Ambulatory/Procedural Areas - Information given) No - patient declined information No - patient declined information - - -  Pre-existing out of facility DNR order (yellow form or pink MOST form) - - - - - No No    Tobacco Social History   Tobacco Use  Smoking Status Current Every Day Smoker  . Packs/day: 0.75  . Years: 40.00  . Pack years: 30.00  . Types: Cigarettes  Smokeless Tobacco Never Used  Tobacco Comment   1 cig a day     Ready to quit: Not Answered Counseling given: Not Answered Comment: 1 cig a day   Clinical Intake:     Pain :  No/denies pain                 Past Medical History:  Diagnosis Date  . Altered mental status   . Backache, unspecified   . Gastric ulcer, unspecified as acute or chronic, without mention of hemorrhage, perforation, or obstruction   . Helicobacter pylori (H. pylori)   . Tobacco abuse   . Unspecified essential hypertension    Past Surgical History:  Procedure Laterality Date  . UPPER GASTROINTESTINAL ENDOSCOPY  2013   Family History  Problem Relation Age of Onset  . Diabetes Sister   . Hypertension Mother   . Colon cancer Neg Hx   . Stomach cancer Neg Hx    Social History   Socioeconomic History  . Marital status: Single    Spouse name: Not on file  . Number of children: Not on file  . Years of education: Not on file  . Highest education level: Not on file  Occupational History  . Occupation: permanant disability    Employer: [employer]  Social Needs  . Financial resource strain: Not on file  . Food insecurity:    Worry: Not on file    Inability: Not on file  . Transportation needs:    Medical: Not on file    Non-medical: Not on file  Tobacco  Use  . Smoking status: Current Every Day Smoker    Packs/day: 0.75    Years: 40.00    Pack years: 30.00    Types: Cigarettes  . Smokeless tobacco: Never Used  . Tobacco comment: 1 cig a day  Substance and Sexual Activity  . Alcohol use: Yes    Comment: occassional; beer   . Drug use: No  . Sexual activity: Not Currently    Partners: Male  Lifestyle  . Physical activity:    Days per week: Not on file    Minutes per session: Not on file  . Stress: Not on file  Relationships  . Social connections:    Talks on phone: Not on file    Gets together: Not on file    Attends religious service: Not on file    Active member of club or organization: Not on file    Attends meetings of clubs or organizations: Not on file    Relationship status: Not on file  Other Topics Concern  . Not on file  Social History  Narrative   Exercise--- walking    Outpatient Encounter Medications as of 08/12/2017  Medication Sig  . aspirin EC 81 MG tablet Take 1 tablet (81 mg total) by mouth daily.  . cyclobenzaprine (FLEXERIL) 10 MG tablet TAKE 1 TABLET(10 MG) BY MOUTH THREE TIMES DAILY AS NEEDED  . dexlansoprazole (DEXILANT) 60 MG capsule Take 1 capsule (60 mg total) by mouth daily.  . hydroxychloroquine (PLAQUENIL) 200 MG tablet Take 200 mg by mouth 2 (two) times daily.  Marland Kitchen losartan (COZAAR) 100 MG tablet Take 1 tablet (100 mg total) by mouth daily.  . metoprolol succinate (TOPROL-XL) 100 MG 24 hr tablet TAKE 1 TABLET BY MOUTH DAILY WITH OR IMMEDIATELY FOLLOWING A MEAL  . Multiple Vitamin (MULTIVITAMIN) tablet Take 1 tablet by mouth daily.  . mycophenolate (CELLCEPT) 500 MG tablet Take 1,500 mg by mouth 2 (two) times daily.   . nicotine (NICOTROL) 10 MG inhaler Inhale 1 Cartridge (1 continuous puffing total) into the lungs as needed for smoking cessation.  . [DISCONTINUED] dexlansoprazole (DEXILANT) 60 MG capsule Take 1 capsule (60 mg total) by mouth daily.  . [DISCONTINUED] metoprolol succinate (TOPROL-XL) 100 MG 24 hr tablet TAKE 1 TABLET BY MOUTH DAILY WITH OR IMMEDIATELY FOLLOWING A MEAL   No facility-administered encounter medications on file as of 08/12/2017.     Activities of Daily Living In your present state of health, do you have any difficulty performing the following activities: 08/12/2017  Hearing? N  Vision? N  Difficulty concentrating or making decisions? N  Walking or climbing stairs? N  Dressing or bathing? N  Doing errands, shopping? N  Preparing Food and eating ? N  Using the Toilet? N  In the past six months, have you accidently leaked urine? N  Do you have problems with loss of bowel control? N  Managing your Medications? N  Managing your Finances? N  Housekeeping or managing your Housekeeping? N  Some recent data might be hidden    Patient Care Team: Carollee Herter, Alferd Apa, DO as  PCP - General (Family Medicine) Candee Furbish, MD as Referring Physician (Rheumatology) Inda Castle, MD (Inactive) as Consulting Physician (Gastroenterology) Melissa Noon, [place] as Referring Physician (Optometry)    Assessment:   This is a routine wellness examination for Samantha [name]. Physical assessment deferred to PCP.  Exercise Activities and Dietary recommendations Current Exercise Habits: The patient does not participate in regular exercise at present, Exercise limited  by: None identified Diet (meal preparation, eat out, water intake, caffeinated beverages, dairy products, fruits and vegetables): in general, an "unhealthy" diet     Goals    . Quit smoking / using tobacco       Fall Risk Fall Risk  08/12/2017 07/25/2014  Falls in the past year? No No   Depression Screen PHQ 2/9 Scores 08/12/2017 02/11/2017 01/26/2016 07/25/2014  PHQ - 2 Score 0 0 0 0     Cognitive Function MMSE - Mini Mental State Exam 08/12/2017  Orientation to time 5  Orientation to Place 5  Registration 3  Attention/ Calculation 5  Recall 1  Language- name 2 objects 2  Language- repeat 1  Language- follow 3 step command 3  Language- read & follow direction 1  Write a sentence 1  Copy design 1  Total score 28        Immunization History  Administered Date(s) Administered  . Tdap 09/15/2012   Screening Tests Health Maintenance  Topic Date Due  . COLONOSCOPY  03/14/2017  . PAP SMEAR  12/25/2018  . MAMMOGRAM  02/01/2019  . TETANUS/TDAP  09/16/2022  . Hepatitis C Screening  Completed  . HIV Screening  Completed      Plan:    Please schedule your next medicare wellness visit with me in 1 yr.  Continue to eat heart healthy diet (full of fruits, vegetables, whole grains, lean protein, water--limit salt, fat, and sugar intake) and increase physical activity as tolerated.  Continue doing brain stimulating activities (puzzles, reading, adult coloring books, staying active) to keep memory sharp.      I have personally reviewed and noted the following in the patient's chart:   . Medical and social history . Use of alcohol, tobacco or illicit drugs  . Current medications and supplements . Functional ability and status . Nutritional status . Physical activity . Advanced directives . List of other physicians . Hospitalizations, surgeries, and ER visits in previous 12 months . Vitals . Screenings to include cognitive, depression, and falls . Referrals and appointments  In addition, I have reviewed and discussed with patient certain preventive protocols, quality metrics, and best practice recommendations. A written personalized care plan for preventive services as well as general preventive health recommendations were provided to patient.     Shela Nevin, [place]  08/12/2017

## 2017-08-12 ENCOUNTER — Encounter: Payer: Self-pay | Admitting: *Deleted

## 2017-08-12 ENCOUNTER — Encounter: Payer: Self-pay | Admitting: Family Medicine

## 2017-08-12 ENCOUNTER — Ambulatory Visit (INDEPENDENT_AMBULATORY_CARE_PROVIDER_SITE_OTHER): Payer: Medicare Other | Admitting: Family Medicine

## 2017-08-12 ENCOUNTER — Ambulatory Visit (INDEPENDENT_AMBULATORY_CARE_PROVIDER_SITE_OTHER): Payer: Medicare Other | Admitting: *Deleted

## 2017-08-12 VITALS — BP 142/88 | HR 87 | Temp 98.2°F | Ht 67.0 in | Wt 162.4 lb

## 2017-08-12 VITALS — BP 142/58 | HR 87 | Ht 67.0 in | Wt 162.0 lb

## 2017-08-12 DIAGNOSIS — G610 Guillain-Barre syndrome: Secondary | ICD-10-CM | POA: Diagnosis not present

## 2017-08-12 DIAGNOSIS — Z0000 Encounter for general adult medical examination without abnormal findings: Secondary | ICD-10-CM | POA: Diagnosis not present

## 2017-08-12 DIAGNOSIS — M329 Systemic lupus erythematosus, unspecified: Secondary | ICD-10-CM

## 2017-08-12 DIAGNOSIS — M5441 Lumbago with sciatica, right side: Secondary | ICD-10-CM

## 2017-08-12 DIAGNOSIS — E785 Hyperlipidemia, unspecified: Secondary | ICD-10-CM

## 2017-08-12 DIAGNOSIS — G8929 Other chronic pain: Secondary | ICD-10-CM

## 2017-08-12 DIAGNOSIS — I10 Essential (primary) hypertension: Secondary | ICD-10-CM | POA: Diagnosis not present

## 2017-08-12 DIAGNOSIS — K219 Gastro-esophageal reflux disease without esophagitis: Secondary | ICD-10-CM

## 2017-08-12 LAB — COMPREHENSIVE METABOLIC PANEL
ALT: 26 U/L (ref 0–35)
AST: 29 U/L (ref 0–37)
Albumin: 4.1 g/dL (ref 3.5–5.2)
Alkaline Phosphatase: 52 U/L (ref 39–117)
BUN: 17 mg/dL (ref 6–23)
CO2: 26 mEq/L (ref 19–32)
Calcium: 9 mg/dL (ref 8.4–10.5)
Chloride: 107 mEq/L (ref 96–112)
Creatinine, Ser: 0.78 mg/dL (ref 0.40–1.20)
GFR: 98.2 mL/min (ref >60.00)
Glucose, Bld: 81 mg/dL (ref 70–99)
Potassium: 4.5 mEq/L (ref 3.5–5.1)
Sodium: 140 mEq/L (ref 135–145)
Total Bilirubin: 0.2 mg/dL (ref 0.2–1.2)
Total Protein: 6.9 g/dL (ref 6.0–8.3)

## 2017-08-12 LAB — LIPID PANEL
Cholesterol: 219 mg/dL — ABNORMAL HIGH (ref 0–200)
HDL: 68.1 mg/dL (ref >39.00)
LDL Cholesterol: 124 mg/dL — ABNORMAL HIGH (ref 0–99)
NonHDL: 150.41
Total CHOL/HDL Ratio: 3
Triglycerides: 132 mg/dL (ref 0.0–149.0)
VLDL: 26.4 mg/dL (ref 0.0–40.0)

## 2017-08-12 MED ORDER — DEXLANSOPRAZOLE 60 MG PO CPDR: 60.0000 mg | DELAYED_RELEASE_CAPSULE | Freq: Every day | ORAL | 3 refills | Status: DC

## 2017-08-12 MED ORDER — METOPROLOL SUCCINATE ER 100 MG PO TB24: ORAL_TABLET | ORAL | 0 refills | Status: DC

## 2017-08-12 NOTE — Patient Instructions (Signed)
Please schedule your next medicare wellness visit with me in 1 yr.  Continue to eat heart healthy diet (full of fruits, vegetables, whole grains, lean protein, water--limit salt, fat, and sugar intake) and increase physical activity as tolerated.  Continue doing brain stimulating activities (puzzles, reading, adult coloring books, staying active) to keep memory sharp.    Samantha [name] , Thank you for taking time to come for your Medicare Wellness Visit. I appreciate your ongoing commitment to your health goals. Please review the following plan we discussed and let me know if I can assist you in the future.   These are the goals we discussed: Goals    . Quit smoking / using tobacco       This is a list of the screening recommended for you and due dates:  Health Maintenance  Topic Date Due  . Colon Cancer Screening  03/14/2017  . Pap Smear  12/25/2018  . Mammogram  02/01/2019  . Tetanus Vaccine  09/16/2022  .  Hepatitis C: One time screening is recommended by Center for Disease Control  (CDC) for  adults born from [birth year] through 1964.   Completed  . HIV Screening  Completed     Health Maintenance for Postmenopausal Women Menopause is a normal process in which your reproductive ability comes to an end. This process happens gradually over a span of months to years, usually between the ages of 53 and 18. Menopause is complete when you have missed 12 consecutive menstrual periods. It is important to talk with your health care provider about some of the most common conditions that affect postmenopausal women, such as heart disease, cancer, and bone loss (osteoporosis). Adopting a healthy lifestyle and getting preventive care can help to promote your health and wellness. Those actions can also lower your chances of developing some of these common conditions. What should I know about menopause? During menopause, you may experience a number of symptoms, such as:  Moderate-to-severe hot  flashes.  Night sweats.  Decrease in sex drive.  Mood swings.  Headaches.  Tiredness.  Irritability.  Memory problems.  Insomnia.  Choosing to treat or not to treat menopausal changes is an individual decision that you make with your health care provider. What should I know about hormone replacement therapy and supplements? Hormone therapy products are effective for treating symptoms that are associated with menopause, such as hot flashes and night sweats. Hormone replacement carries certain risks, especially as you become older. If you are thinking about using estrogen or estrogen with progestin treatments, discuss the benefits and risks with your health care provider. What should I know about heart disease and stroke? Heart disease, heart attack, and stroke become more likely as you age. This may be due, in part, to the hormonal changes that your body experiences during menopause. These can affect how your body processes dietary fats, triglycerides, and cholesterol. Heart attack and stroke are both medical emergencies. There are many things that you can do to help prevent heart disease and stroke:  Have your blood pressure checked at least every 1-2 years. High blood pressure causes heart disease and increases the risk of stroke.  If you are 64-59 years old, ask your health care provider if you should take aspirin to prevent a heart attack or a stroke.  Do not use any tobacco products, including cigarettes, chewing tobacco, or electronic cigarettes. If you need help quitting, ask your health care provider.  It is important to eat a healthy diet and maintain  a healthy weight. ? Be sure to include plenty of vegetables, fruits, low-fat dairy products, and lean protein. ? Avoid eating foods that are high in solid fats, added sugars, or salt (sodium).  Get regular exercise. This is one of the most important things that you can do for your health. ? Try to exercise for at least 150  minutes each week. The type of exercise that you do should increase your heart rate and make you sweat. This is known as moderate-intensity exercise. ? Try to do strengthening exercises at least twice each week. Do these in addition to the moderate-intensity exercise.  Know your numbers.Ask your health care provider to check your cholesterol and your blood glucose. Continue to have your blood tested as directed by your health care provider.  What should I know about cancer screening? There are several types of cancer. Take the following steps to reduce your risk and to catch any cancer development as early as possible. Breast Cancer  Practice breast self-awareness. ? This means understanding how your breasts normally appear and feel. ? It also means doing regular breast self-exams. Let your health care provider know about any changes, no matter how small.  If you are 40 or older, have a clinician do a breast exam (clinical breast exam or CBE) every year. Depending on your age, family history, and medical history, it may be recommended that you also have a yearly breast X-ray (mammogram).  If you have a family history of breast cancer, talk with your health care provider about genetic screening.  If you are at high risk for breast cancer, talk with your health care provider about having an MRI and a mammogram every year.  Breast cancer (BRCA) gene test is recommended for women who have family members with BRCA-related cancers. Results of the assessment will determine the need for genetic counseling and BRCA1 and for BRCA2 testing. BRCA-related cancers include these types: ? Breast. This occurs in males or females. ? Ovarian. ? Tubal. This may also be called fallopian tube cancer. ? Cancer of the abdominal or pelvic lining (peritoneal cancer). ? Prostate. ? Pancreatic.  Cervical, Uterine, and Ovarian Cancer Your health care provider may recommend that you be screened regularly for cancer  of the pelvic organs. These include your ovaries, uterus, and vagina. This screening involves a pelvic exam, which includes checking for microscopic changes to the surface of your cervix (Pap test).  For women ages 21-65, health care providers may recommend a pelvic exam and a Pap test every three years. For women ages 30-65, they may recommend the Pap test and pelvic exam, combined with testing for human papilloma virus (HPV), every five years. Some types of HPV increase your risk of cervical cancer. Testing for HPV may also be done on women of any age who have unclear Pap test results.  Other health care providers may not recommend any screening for nonpregnant women who are considered low risk for pelvic cancer and have no symptoms. Ask your health care provider if a screening pelvic exam is right for you.  If you have had past treatment for cervical cancer or a condition that could lead to cancer, you need Pap tests and screening for cancer for at least 20 years after your treatment. If Pap tests have been discontinued for you, your risk factors (such as having a new sexual partner) need to be reassessed to determine if you should start having screenings again. Some women have medical problems that increase the chance   of getting cervical cancer. In these cases, your health care provider may recommend that you have screening and Pap tests more often.  If you have a family history of uterine cancer or ovarian cancer, talk with your health care provider about genetic screening.  If you have vaginal bleeding after reaching menopause, tell your health care provider.  There are currently no reliable tests available to screen for ovarian cancer.  Lung Cancer Lung cancer screening is recommended for adults 55-80 years old who are at high risk for lung cancer because of a history of smoking. A yearly low-dose CT scan of the lungs is recommended if you:  Currently smoke.  Have a history of at least 30  pack-years of smoking and you currently smoke or have quit within the past 15 years. A pack-year is smoking an average of one pack of cigarettes per day for one year.  Yearly screening should:  Continue until it has been 15 years since you quit.  Stop if you develop a health problem that would prevent you from having lung cancer treatment.  Colorectal Cancer  This type of cancer can be detected and can often be prevented.  Routine colorectal cancer screening usually begins at age 50 and continues through age 75.  If you have risk factors for colon cancer, your health care provider may recommend that you be screened at an earlier age.  If you have a family history of colorectal cancer, talk with your health care provider about genetic screening.  Your health care provider may also recommend using home test kits to check for hidden blood in your stool.  A small camera at the end of a tube can be used to examine your colon directly (sigmoidoscopy or colonoscopy). This is done to check for the earliest forms of colorectal cancer.  Direct examination of the colon should be repeated every 5-10 years until age 75. However, if early forms of precancerous polyps or small growths are found or if you have a family history or genetic risk for colorectal cancer, you may need to be screened more often.  Skin Cancer  Check your skin from head to toe regularly.  Monitor any moles. Be sure to tell your health care provider: ? About any new moles or changes in moles, especially if there is a change in a mole's shape or color. ? If you have a mole that is larger than the size of a pencil eraser.  If any of your family members has a history of skin cancer, especially at a young age, talk with your health care provider about genetic screening.  Always use sunscreen. Apply sunscreen liberally and repeatedly throughout the day.  Whenever you are outside, protect yourself by wearing long sleeves, pants,  a wide-brimmed hat, and sunglasses.  What should I know about osteoporosis? Osteoporosis is a condition in which bone destruction happens more quickly than new bone creation. After menopause, you may be at an increased risk for osteoporosis. To help prevent osteoporosis or the bone fractures that can happen because of osteoporosis, the following is recommended:  If you are 19-50 years old, get at least 1,000 mg of calcium and at least 600 mg of vitamin D per day.  If you are older than age 50 but younger than age 70, get at least 1,200 mg of calcium and at least 600 mg of vitamin D per day.  If you are older than age 70, get at least 1,200 mg of calcium and at least   800 mg of vitamin D per day.  Smoking and excessive alcohol intake increase the risk of osteoporosis. Eat foods that are rich in calcium and vitamin D, and do weight-bearing exercises several times each week as directed by your health care provider. What should I know about how menopause affects my mental health? Depression may occur at any age, but it is more common as you become older. Common symptoms of depression include:  Low or sad mood.  Changes in sleep patterns.  Changes in appetite or eating patterns.  Feeling an overall lack of motivation or enjoyment of activities that you previously enjoyed.  Frequent crying spells.  Talk with your health care provider if you think that you are experiencing depression. What should I know about immunizations? It is important that you get and maintain your immunizations. These include:  Tetanus, diphtheria, and pertussis (Tdap) booster vaccine.  Influenza every year before the flu season begins.  Pneumonia vaccine.  Shingles vaccine.  Your health care provider may also recommend other immunizations. This information is not intended to replace advice given to you by your health care provider. Make sure you discuss any questions you have with your health care  provider. Document Released: 03/08/2005 Document Revised: 08/04/2015 Document Reviewed: 10/18/2014 Elsevier Interactive Patient Education  2018 [employer].

## 2017-08-12 NOTE — Progress Notes (Signed)
Reviewed  Ren Aspinall R Lowne Chase, DO  

## 2017-08-12 NOTE — Assessment & Plan Note (Signed)
Encouraged heart healthy diet, increase exercise, avoid trans fats, consider a krill oil cap daily 

## 2017-08-12 NOTE — Assessment & Plan Note (Signed)
Poorly controlled will alter medications, encouraged DASH diet, minimize caffeine and obtain adequate sleep. Report concerning symptoms and follow up as directed and as needed 

## 2017-08-12 NOTE — Assessment & Plan Note (Signed)
Per rheum 

## 2017-08-12 NOTE — Progress Notes (Signed)
Patient ID: Samantha [name], female    DOB: 25-Oct-1960  Age: 57 y.o. MRN: [id number]    Subjective:  Subjective  HPI FEIGE LOWDERMILK presents for fu bp   No complaints  She is still smoking 1 cig a day-- she is using the nicotrol inhaler.  Pt is exercising -- walking a little due to back pain ---  Pain is in L back and r hip x several years.  MRI done in 2013 --  Pt is ready to see someone for it now.  No worse just no better.     Review of Systems  Constitutional: Negative for appetite change, diaphoresis, fatigue and unexpected weight change.  Eyes: Negative for pain, redness and visual disturbance.  Respiratory: Negative for cough, chest tightness, shortness of breath and wheezing.   Cardiovascular: Negative for chest pain, palpitations and leg swelling.  Endocrine: Negative for cold intolerance, heat intolerance, polydipsia, polyphagia and polyuria.  Genitourinary: Negative for difficulty urinating, dysuria and frequency.  Musculoskeletal: Positive for back pain.  Neurological: Negative for dizziness, light-headedness, numbness and headaches.    History Past Medical History:  Diagnosis Date  . Altered mental status   . Backache, unspecified   . Gastric ulcer, unspecified as acute or chronic, without mention of hemorrhage, perforation, or obstruction   . Helicobacter pylori (H. pylori)   . Tobacco abuse   . Unspecified essential hypertension     She has a past surgical history that includes Upper gastrointestinal endoscopy (2013).   Her family history includes Diabetes in her sister; Hypertension in her mother.She reports that she has been smoking cigarettes.  She has a 30.00 pack-year smoking history. She has never used smokeless tobacco. She reports that she drinks alcohol. She reports that she does not use drugs.  Current Outpatient Medications on File Prior to Visit  Medication Sig Dispense Refill  . aspirin EC 81 MG tablet Take 1 tablet (81 mg total) by mouth daily.      . cyclobenzaprine (FLEXERIL) 10 MG tablet TAKE 1 TABLET(10 MG) BY MOUTH THREE TIMES DAILY AS NEEDED 30 tablet 0  . hydroxychloroquine (PLAQUENIL) 200 MG tablet Take 200 mg by mouth 2 (two) times daily.    Marland Kitchen losartan (COZAAR) 100 MG tablet Take 1 tablet (100 mg total) by mouth daily. 90 tablet 3  . Multiple Vitamin (MULTIVITAMIN) tablet Take 1 tablet by mouth daily.    . mycophenolate (CELLCEPT) 500 MG tablet Take 1,500 mg by mouth 2 (two) times daily.     . nicotine (NICOTROL) 10 MG inhaler Inhale 1 Cartridge (1 continuous puffing total) into the lungs as needed for smoking cessation. 42 each 0   No current facility-administered medications on file prior to visit.      Objective:  Objective  Physical Exam  Constitutional: She is oriented to person, place, and time. She appears well-developed and well-nourished.  HENT:  Head: Normocephalic and atraumatic.  Eyes: Conjunctivae and EOM are normal.  Neck: Normal range of motion. Neck supple. No JVD present. Carotid bruit is not present. No thyromegaly present.  Cardiovascular: Normal rate, regular rhythm and normal heart sounds.  No murmur heard. Pulmonary/Chest: Effort normal and breath sounds normal. No respiratory distress. She has no wheezes. She has no rales. She exhibits no tenderness.  Musculoskeletal: She exhibits no edema.  Neurological: She is alert and oriented to person, place, and time.  Psychiatric: She has a normal mood and affect.  Nursing note and vitals reviewed.  BP (!) 142/88 (BP Location:  Left Arm, Patient Position: Sitting, Cuff Size: Normal)   Pulse 87   Temp 98.2 F (36.8 C) (Oral)   Ht 5\' 7"  (1.702 m)   Wt 162 lb 6.4 oz (73.7 kg)   LMP 04/11/2011   SpO2 97%   BMI 25.44 kg/m  Wt Readings from Last 3 Encounters:  08/12/17 162 lb 6.4 oz (73.7 kg)  06/03/17 162 lb (73.5 kg)  04/08/17 162 lb 9 oz (73.7 kg)     Lab Results  Component Value Date   WBC 4.2 06/18/2016   HGB 12.6 06/18/2016   HCT 39.4  06/18/2016   PLT 241.0 06/18/2016   GLUCOSE 98 02/11/2017   CHOL 198 02/11/2017   TRIG 89.0 02/11/2017   HDL 60.60 02/11/2017   LDLDIRECT 141.8 09/15/2012   LDLCALC 119 (H) 02/11/2017   ALT 30 02/11/2017   AST 35 02/11/2017   NA 139 02/11/2017   K 5.2 (H) 02/11/2017   CL 106 02/11/2017   CREATININE 0.92 02/11/2017   BUN 15 02/11/2017   CO2 29 02/11/2017   TSH 0.83 09/15/2012   INR 1.14 02/26/2011   HGBA1C 5.6 03/13/2011    No results found.   Assessment & Plan:  Plan  I am having Inez Pilgrim. Quentin Cornwall maintain her multivitamin, hydroxychloroquine, mycophenolate, losartan, nicotine, aspirin EC, cyclobenzaprine, metoprolol succinate, and dexlansoprazole.  Meds ordered this encounter  Medications  . metoprolol succinate (TOPROL-XL) 100 MG 24 hr tablet    Sig: TAKE 1 TABLET BY MOUTH DAILY WITH OR IMMEDIATELY FOLLOWING A MEAL    Dispense:  90 tablet    Refill:  0    **Patient requests 90 days supply**  . dexlansoprazole (DEXILANT) 60 MG capsule    Sig: Take 1 capsule (60 mg total) by mouth daily.    Dispense:  90 capsule    Refill:  3    Problem List Items Addressed This Visit      Unprioritized   Back pain - Primary   Relevant Orders   Ambulatory referral to Orthopedic Surgery   Disseminated lupus erythematosus ([place])    Per rheum      Gastroesophageal reflux disease without esophagitis   Relevant Medications   dexlansoprazole (DEXILANT) 60 MG capsule   HTN (hypertension)    Poorly controlled will alter medications, encouraged DASH diet, minimize caffeine and obtain adequate sleep. Report concerning symptoms and follow up as directed and as needed      Relevant Medications   metoprolol succinate (TOPROL-XL) 100 MG 24 hr tablet   Other Relevant Orders   Lipid panel   Comprehensive metabolic panel   Hyperlipidemia LDL goal <100    Encouraged heart healthy diet, increase exercise, avoid trans fats, consider a krill oil cap daily      Relevant Medications    metoprolol succinate (TOPROL-XL) 100 MG 24 hr tablet    Other Visit Diagnoses    Guillain Barr syndrome ([place])          Follow-up: Return in about 6 months (around 02/12/2018), or if symptoms worsen or fail to improve, for annual exam, fasting.  [name], DO

## 2017-08-12 NOTE — Patient Instructions (Signed)

## 2017-08-18 NOTE — Addendum Note (Signed)
Addended byDamita Dunnings D on: 08/18/2017 12:04 PM   Modules accepted: Orders

## 2017-09-03 ENCOUNTER — Other Ambulatory Visit (HOSPITAL_COMMUNITY): Payer: Self-pay

## 2017-09-03 NOTE — Addendum Note (Signed)
Addended by: Particia Jasper A on: 09/03/2017 12:55 PM   Modules accepted: Orders

## 2017-09-04 ENCOUNTER — Ambulatory Visit (HOSPITAL_COMMUNITY)
Admission: RE | Admit: 2017-09-04 | Discharge: 2017-09-04 | Disposition: A | Discharge status: Home / Self Care | Payer: Medicare Other | Source: Ambulatory Visit | Attending: Internal Medicine | Admitting: Internal Medicine

## 2017-09-04 DIAGNOSIS — D8989 Other specified disorders involving the immune mechanism, not elsewhere classified: Secondary | ICD-10-CM | POA: Diagnosis not present

## 2017-09-04 DIAGNOSIS — G629 Polyneuropathy, unspecified: Secondary | ICD-10-CM | POA: Diagnosis not present

## 2017-09-04 DIAGNOSIS — G609 Hereditary and idiopathic neuropathy, unspecified: Secondary | ICD-10-CM | POA: Diagnosis not present

## 2017-09-04 DIAGNOSIS — M329 Systemic lupus erythematosus, unspecified: Secondary | ICD-10-CM | POA: Diagnosis not present

## 2017-09-04 DIAGNOSIS — G909 Disorder of the autonomic nervous system, unspecified: Secondary | ICD-10-CM | POA: Insufficient documentation

## 2017-09-04 MED ORDER — ACETAMINOPHEN 325 MG PO TABS
ORAL_TABLET | ORAL | Status: AC
  Administered 2017-09-04: 650 mg via ORAL
  Filled 2017-09-04: qty 2

## 2017-09-04 MED ORDER — DEXTROSE 5 % IV SOLN
INTRAVENOUS | Status: DC
  Administered 2017-09-04: 09:00:00 via INTRAVENOUS

## 2017-09-04 MED ORDER — ACETAMINOPHEN 325 MG PO TABS
650.0000 mg | ORAL_TABLET | Freq: Once | ORAL | Status: AC
  Administered 2017-09-04: 650 mg via ORAL

## 2017-09-04 MED ORDER — IMMUNE GLOBULIN (HUMAN) 10 GM/100ML IV SOLN
70.0000 g | Freq: Once | INTRAVENOUS | Status: AC
  Administered 2017-09-04: 70 g via INTRAVENOUS
  Filled 2017-09-04: qty 100

## 2017-09-04 MED ORDER — DIPHENHYDRAMINE HCL 25 MG PO CAPS
ORAL_CAPSULE | ORAL | Status: AC
  Administered 2017-09-04: 25 mg via ORAL
  Filled 2017-09-04: qty 1

## 2017-09-04 MED ORDER — DIPHENHYDRAMINE HCL 25 MG PO CAPS
25.0000 mg | ORAL_CAPSULE | Freq: Once | ORAL | Status: AC
  Administered 2017-09-04: 25 mg via ORAL

## 2017-09-12 ENCOUNTER — Ambulatory Visit (INDEPENDENT_AMBULATORY_CARE_PROVIDER_SITE_OTHER): Payer: Medicare Other

## 2017-09-12 ENCOUNTER — Other Ambulatory Visit (INDEPENDENT_AMBULATORY_CARE_PROVIDER_SITE_OTHER): Payer: Self-pay | Admitting: Radiology

## 2017-09-12 ENCOUNTER — Ambulatory Visit (INDEPENDENT_AMBULATORY_CARE_PROVIDER_SITE_OTHER): Payer: Medicare Other | Admitting: Orthopaedic Surgery

## 2017-09-12 ENCOUNTER — Encounter (INDEPENDENT_AMBULATORY_CARE_PROVIDER_SITE_OTHER): Payer: Self-pay | Admitting: Orthopaedic Surgery

## 2017-09-12 VITALS — BP 166/99 | HR 71 | Ht 68.0 in | Wt 162.0 lb

## 2017-09-12 DIAGNOSIS — M545 Low back pain, unspecified: Secondary | ICD-10-CM

## 2017-09-12 DIAGNOSIS — G8929 Other chronic pain: Secondary | ICD-10-CM

## 2017-09-12 NOTE — Progress Notes (Signed)
Office Visit Note   Patient: Samantha [name]           Date of Birth: January 25, 1961           MRN: [id number] Visit Date: 09/12/2017              Requested by: [address] [place], [place] [address] [place], [place] [address] PCP: Carollee Herter, Alferd Apa, DO   Assessment & Plan: Visit Diagnoses:  1. Chronic midline low back pain without sciatica     Plan: Chronic low back pain without radicular component.  MRI scan lumbar spine to return shortly thereafter.  Might be a candidate for facet joint injections and physical therapy based on the above results.  Long discussion regarding x-ray findings and treatment options.  No change in medicines.  Follow-Up Instructions: Return after MRI L-S spine.   Orders:  Orders Placed This Encounter  Procedures  . XR Lumbar Spine 2-3 Views   No orders of the defined types were placed in this encounter.     Procedures: No procedures performed   Clinical Data: No additional findings.   Subjective: Chief Complaint  Patient presents with  . New Patient (Initial Visit)    back pain for 3-4 yrs getting worse, no pain radiating down legs. no injury  Samantha [name] is 57 years old visited the office for evaluation of chronic low back pain.  She notes insidious onset "years ago".  She is been treated in [facility] and also followed more recently by her primary care physician here in [place].  Her pain seems to be localized to the lower lumbar spine without referred pain to either lower extremity.  She has not had any numbness or tingling.  She relates a history of "lupus".  She has been taking Flexeril and Plaquenil.  She also relates having had an MRI scan several years ago but we could not find it on the PACS system.  SHe is not working and on disability  HPI  Review of Systems  Constitutional: Negative for fatigue and fever.  HENT: Negative for ear pain.   Eyes: Negative for pain.  Respiratory: Negative  for cough and shortness of breath.   Cardiovascular: Negative for leg swelling.  Gastrointestinal: Negative for constipation and diarrhea.  Genitourinary: Negative for difficulty urinating.  Musculoskeletal: Positive for back pain. Negative for neck pain.  Skin: Negative for rash.  Allergic/Immunologic: Negative for food allergies.  Neurological: Positive for weakness. Negative for numbness.  Hematological: Does not bruise/bleed easily.  Psychiatric/Behavioral: Positive for sleep disturbance.     Objective: Vital Signs: BP (!) 166/99 (BP Location: Left Arm, Patient Position: Sitting, Cuff Size: Normal)   Pulse 71   Ht 5\' 8"  (1.727 m)   Wt 162 lb (73.5 kg)   LMP 04/11/2011   BMI 24.63 kg/m   Physical Exam  Constitutional: She is oriented to person, place, and time. She appears well-developed and well-nourished.  HENT:  Mouth/Throat: Oropharynx is clear and moist.  Eyes: Pupils are equal, round, and reactive to light. EOM are normal.  Pulmonary/Chest: Effort normal.  Neurological: She is alert and oriented to person, place, and time.  Skin: Skin is warm and dry.  Psychiatric: She has a normal mood and affect. Her behavior is normal.    Ortho Exam awake alert and oriented x3.  Comfortable sitting.  Smells of alcohol.  Straight leg raise negative bilaterally.  Painless range of motion both hips and both  knees.  Motor exam intact sensory exam intact.  Mild percussible tenderness lower lumbar spine.  Some mild discomfort with forward extension of her lumbar spine.  Pelvis appeared to be level. Specialty Comments:  No specialty comments available.  Imaging: Xr Lumbar Spine 2-3 Views  Result Date: 09/12/2017 Lumbar spine were obtained in 2 projections standing there is about a 5 to 6 degree degenerative curvature to the right.  Abundant stool and bowel gas.  Some sclerosis about the sacroiliac joints.  Facet joint sclerosis particularly at L4-5 and L5-S1.  On the lateral view there is  an anterior listhesis of L4 on L5.  There is significant decrease in the disc space between L5 and S1.    PMFS History: Patient Active Problem List   Diagnosis Date Noted  . Hyperlipidemia LDL goal <100 02/11/2017  . Gastroesophageal reflux disease without esophagitis 02/11/2017  . HTN (hypertension) 06/23/2015  . Abdominal pain, epigastric 06/23/2015  . Polypharmacy 06/30/2012  . Tobacco use disorder 11/22/2011  . Achilles tendinitis 11/20/2011  . Lupus (systemic lupus erythematosus) ([place]) 08/22/2011  . Disseminated lupus erythematosus ([place]) 08/21/2011  . 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 GBS (Guillain Barre syndrome) ([place]) 04/09/2011  . Headache(784.0) 03/09/2011  . Back pain 03/09/2011  . Hyponatremia 03/09/2011  . Hypokalemia 03/09/2011   Past Medical History:  Diagnosis Date  . Altered mental status   . Backache, unspecified   . Gastric ulcer, unspecified as acute or chronic, without mention of hemorrhage, perforation, or obstruction   . Helicobacter pylori (H. pylori)   . Tobacco abuse   . Unspecified essential hypertension     Family History  Problem Relation Age of Onset  . Diabetes Sister   . Hypertension Mother   . Colon cancer Neg Hx   . Stomach cancer Neg Hx     Past Surgical History:  Procedure Laterality Date  . UPPER GASTROINTESTINAL ENDOSCOPY  2013   Social History   Occupational History  . Occupation: permanant disability    Employer: [employer]  Tobacco Use  . Smoking status: Current Every Day Smoker    Packs/day: 0.75    Years: 40.00    Pack years: 30.00    Types: Cigarettes  . Smokeless tobacco: Never Used  . Tobacco comment: 1 cig a day  Substance and Sexual Activity  . Alcohol use: Yes    Comment: occassional; beer   . Drug use: No  . Sexual activity: Not Currently    Partners: Male

## 2017-09-25 DIAGNOSIS — N281 Cyst of kidney, acquired: Secondary | ICD-10-CM | POA: Diagnosis not present

## 2017-10-08 ENCOUNTER — Ambulatory Visit
Admission: RE | Admit: 2017-10-08 | Discharge: 2017-10-08 | Disposition: A | Discharge status: Home / Self Care | Payer: Medicare Other | Source: Ambulatory Visit | Attending: Orthopaedic Surgery | Admitting: Orthopaedic Surgery

## 2017-10-08 DIAGNOSIS — G8929 Other chronic pain: Secondary | ICD-10-CM

## 2017-10-08 DIAGNOSIS — M545 Low back pain, unspecified: Secondary | ICD-10-CM

## 2017-10-10 ENCOUNTER — Encounter (INDEPENDENT_AMBULATORY_CARE_PROVIDER_SITE_OTHER): Payer: Self-pay | Admitting: Orthopaedic Surgery

## 2017-10-10 ENCOUNTER — Ambulatory Visit (INDEPENDENT_AMBULATORY_CARE_PROVIDER_SITE_OTHER): Payer: Medicare Other | Admitting: Orthopaedic Surgery

## 2017-10-10 VITALS — BP 151/92 | HR 74 | Ht 68.0 in | Wt 162.0 lb

## 2017-10-10 DIAGNOSIS — M5442 Lumbago with sciatica, left side: Secondary | ICD-10-CM | POA: Diagnosis not present

## 2017-10-10 DIAGNOSIS — G8929 Other chronic pain: Secondary | ICD-10-CM | POA: Diagnosis not present

## 2017-10-10 DIAGNOSIS — M5441 Lumbago with sciatica, right side: Secondary | ICD-10-CM | POA: Diagnosis not present

## 2017-10-10 NOTE — Progress Notes (Signed)
Office Visit Note   Patient: Samantha [name]           Date of Birth: 07-Mar-1960           MRN: [id number] Visit Date: 10/10/2017              Requested by: [address] [place], [place] [address] [place], [place] [address] PCP: Carollee Herter, Alferd Apa, DO   Assessment & Plan: Visit Diagnoses:  1. Chronic bilateral low back pain with bilateral sciatica     Plan: MRI scan of lumbar spine revealed no acute findings or significant change from her previous studies in 2013.  There are advanced facet atrophic changes at L4-5 grade 1 anterolisthesis.  There is no spinal stenosis or nerve root encroachment.  Stable degenerative changes at L5-S1.  Discussion regarding findings and treatment options.  Office visit over 30 minutes 50% of the time in counseling.  The course of physical therapy would be helpful.  She may continue with icy hot and heating pad.  She has not worked in over 4 years so that is not an issue.  Return to the office in 2 months  Follow-Up Instructions: Return in about 2 months (around 12/10/2017).   Orders:  Orders Placed This Encounter  Procedures  . Ambulatory referral to Physical Therapy   No orders of the defined types were placed in this encounter.     Procedures: No procedures performed   Clinical Data: No additional findings.   Subjective: Chief Complaint  Patient presents with  . Follow-up    MRI REVIEW L SPINE  No change in symptoms.  Predominant low back pain without lower extremity radiculopathy no bowel or bladder discomfort.  HPI  Review of Systems  Constitutional: Positive for fatigue.  HENT: Negative for ear pain.   Eyes: Negative for pain.  Respiratory: Negative for cough and shortness of breath.   Cardiovascular: Negative for leg swelling.  Gastrointestinal: Negative for constipation and diarrhea.  Genitourinary: Negative for difficulty urinating.  Musculoskeletal: Positive for back pain. Negative for neck pain.  Skin:  Negative for rash.  Allergic/Immunologic: Negative for food allergies.  Neurological: Positive for weakness. Negative for numbness.  Hematological: Does not bruise/bleed easily.  Psychiatric/Behavioral: Positive for sleep disturbance.     Objective: Vital Signs: BP (!) 151/92 (BP Location: Left Arm, Patient Position: Sitting, Cuff Size: Normal)   Pulse 74   Ht 5\' 8"  (1.727 m)   Wt 162 lb (73.5 kg)   LMP 04/11/2011   BMI 24.63 kg/m   Physical Exam  Constitutional: She is oriented to person, place, and time. She appears well-developed and well-nourished.  HENT:  Mouth/Throat: Oropharynx is clear and moist.  Eyes: Pupils are equal, round, and reactive to light. EOM are normal.  Pulmonary/Chest: Effort normal.  Neurological: She is alert and oriented to person, place, and time.  Skin: Skin is warm and dry.  Psychiatric: She has a normal mood and affect. Her behavior is normal.    Ortho Exam awake alert and oriented x3.  Comfortable sitting.  Straight leg raise negative.  Very minimal percussible low back pain.  Neurologically intact.  No lower extremity edema.  Painless range of motion both hips and knees  Specialty Comments:  No specialty comments available.  Imaging: No results found.   PMFS History: Patient Active Problem List   Diagnosis Date Noted  . Hyperlipidemia LDL goal <100 02/11/2017  . Gastroesophageal reflux disease without esophagitis 02/11/2017  .  HTN (hypertension) 06/23/2015  . Abdominal pain, epigastric 06/23/2015  . Polypharmacy 06/30/2012  . Tobacco use disorder 11/22/2011  . Achilles tendinitis 11/20/2011  . Lupus (systemic lupus erythematosus) ([place]) 08/22/2011  . Disseminated lupus erythematosus ([place]) 08/21/2011  . 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 GBS (Guillain Barre syndrome) ([place]) 04/09/2011  . Headache(784.0) 03/09/2011  . Back pain 03/09/2011  . Hyponatremia 03/09/2011  . Hypokalemia 03/09/2011   Past Medical History:    Diagnosis Date  . Altered mental status   . Backache, unspecified   . Gastric ulcer, unspecified as acute or chronic, without mention of hemorrhage, perforation, or obstruction   . Helicobacter pylori (H. pylori)   . Tobacco abuse   . Unspecified essential hypertension     Family History  Problem Relation Age of Onset  . Diabetes Sister   . Hypertension Mother   . Colon cancer Neg Hx   . Stomach cancer Neg Hx     Past Surgical History:  Procedure Laterality Date  . UPPER GASTROINTESTINAL ENDOSCOPY  2013   Social History   Occupational History  . Occupation: permanant disability    Employer: [employer]  Tobacco Use  . Smoking status: Current Every Day Smoker    Packs/day: 0.75    Years: 40.00    Pack years: 30.00    Types: Cigarettes  . Smokeless tobacco: Never Used  . Tobacco comment: 1 cig a day  Substance and Sexual Activity  . Alcohol use: Yes    Comment: occassional; beer   . Drug use: No  . Sexual activity: Not Currently    Partners: Male

## 2017-10-15 DIAGNOSIS — M5135 Other intervertebral disc degeneration, thoracolumbar region: Secondary | ICD-10-CM | POA: Diagnosis not present

## 2017-10-15 DIAGNOSIS — M545 Low back pain: Secondary | ICD-10-CM | POA: Diagnosis not present

## 2017-10-16 ENCOUNTER — Telehealth: Payer: Self-pay | Admitting: *Deleted

## 2017-10-16 NOTE — Telephone Encounter (Signed)
We need her letter with juror number , dates etc

## 2017-10-16 NOTE — Telephone Encounter (Signed)
Please advise 

## 2017-10-16 NOTE — Telephone Encounter (Signed)
Copied from [place] [id number]. Topic: General - Other >> Oct 13, 2017  4:38 PM Mcneil, Ja-Kwan wrote: Reason for CRM: Pt states she received a letter for jury duty but she feels that she will not be able to sit that long for jury duty. Pt requests a note from the doctor to be relieved of jury duty. Cb# [phone]

## 2017-10-17 ENCOUNTER — Encounter: Payer: Self-pay | Admitting: Family Medicine

## 2017-10-17 NOTE — Telephone Encounter (Signed)
Informed Pt jury exception letter ready for pick up-- she prefers it to be mailed.

## 2017-10-17 NOTE — Telephone Encounter (Signed)
Attempted to reach pt and left message for pt to return my call. [place] for [facility] / Triage to discuss with pt when she calls back.

## 2017-10-17 NOTE — Telephone Encounter (Signed)
Pt returned call, Juror # [id number] on  Wed November 13 at 8:15a in [place].

## 2017-10-17 NOTE — Telephone Encounter (Signed)
Letter printed.

## 2017-10-17 NOTE — Telephone Encounter (Signed)
FYI

## 2017-10-28 DIAGNOSIS — M5135 Other intervertebral disc degeneration, thoracolumbar region: Secondary | ICD-10-CM | POA: Diagnosis not present

## 2017-10-28 DIAGNOSIS — M545 Low back pain: Secondary | ICD-10-CM | POA: Diagnosis not present

## 2017-11-04 DIAGNOSIS — M545 Low back pain: Secondary | ICD-10-CM | POA: Diagnosis not present

## 2017-11-04 DIAGNOSIS — M5135 Other intervertebral disc degeneration, thoracolumbar region: Secondary | ICD-10-CM | POA: Diagnosis not present

## 2017-11-07 ENCOUNTER — Other Ambulatory Visit: Payer: Self-pay | Admitting: Family Medicine

## 2017-11-07 DIAGNOSIS — I10 Essential (primary) hypertension: Secondary | ICD-10-CM

## 2017-11-18 DIAGNOSIS — M5135 Other intervertebral disc degeneration, thoracolumbar region: Secondary | ICD-10-CM | POA: Diagnosis not present

## 2017-11-18 DIAGNOSIS — M545 Low back pain: Secondary | ICD-10-CM | POA: Diagnosis not present

## 2017-11-28 ENCOUNTER — Other Ambulatory Visit: Payer: Self-pay | Admitting: Family Medicine

## 2017-11-28 DIAGNOSIS — I10 Essential (primary) hypertension: Secondary | ICD-10-CM

## 2017-11-28 MED ORDER — LOSARTAN POTASSIUM 100 MG PO TABS: 100.0000 mg | ORAL_TABLET | Freq: Every day | ORAL | 1 refills | Status: DC

## 2017-11-28 NOTE — Telephone Encounter (Signed)
Copied from [place] [phone] [phone]. Topic: Quick Communication - Rx Refill/Question >> Nov 28, 2017  9:26 AM Bea Graff, NT wrote: Medication: losartan (COZAAR) 100 MG tablet   Has the patient contacted their pharmacy? Yes.   (Agent: If no, request that the patient contact the pharmacy for the refill.) (Agent: If yes, when and what did the pharmacy advise?)  Preferred Pharmacy (with phone number or street name): [id number] - [place], [place] [phone] (Phone) [phone] (Fax)    Agent: Please be advised that RX refills may take up to 3 business days. We ask that you follow-up with your pharmacy.

## 2017-11-28 NOTE — Telephone Encounter (Signed)
Requested medication (s) are due for refill today: yes  Requested medication (s) are on the active medication list: yes  Last refill:  08/05/16  Expired  Future visit scheduled: yes  Notes to clinic:  Expired    Requested Prescriptions  Pending Prescriptions Disp Refills   losartan (COZAAR) 100 MG tablet 90 tablet 3    Sig: Take 1 tablet (100 mg total) by mouth daily.     Cardiovascular:  Angiotensin Receptor Blockers Failed - 11/28/2017  9:45 AM      Failed - Last BP in normal range    BP Readings from Last 1 Encounters:  10/10/17 (!) 151/92         Passed - Cr in normal range and within 180 days    Creatinine, Ser  Date Value Ref Range Status  08/12/2017 0.78 0.40 - 1.20 mg/dL Final         Passed - K in normal range and within 180 days    Potassium  Date Value Ref Range Status  08/12/2017 4.5 3.5 - 5.1 mEq/L Final         Passed - Patient is not pregnant      Passed - Valid encounter within last 6 months    Recent Outpatient Visits          3 months ago Chronic right-sided low back pain with right-sided sciatica   Archivist at [facility], DO   9 months ago Hyperlipidemia LDL goal <100   Archivist at [facility], [place], DO   1 year ago Essential hypertension   Archivist at [place], DO   1 year ago Epigastric pain   Archivist at [facility], [place], DO   1 year ago Essential hypertension   Archivist at [facility], Alferd Apa, DO      Future Appointments            In 1 week [place] Fortes, Vonna Kotyk, MD [facility]   In 2 months [place], DO [employer] [facility], [place]   In 8 months [place], [place], Research scientist (physical sciences) at [facility], [place]

## 2017-12-02 ENCOUNTER — Inpatient Hospital Stay (HOSPITAL_COMMUNITY): Admission: RE | Admit: 2017-12-02 | Payer: Medicare Other | Source: Ambulatory Visit

## 2017-12-04 ENCOUNTER — Encounter (HOSPITAL_COMMUNITY): Payer: Medicare Other

## 2017-12-08 ENCOUNTER — Other Ambulatory Visit (HOSPITAL_COMMUNITY): Payer: Self-pay | Admitting: *Deleted

## 2017-12-08 ENCOUNTER — Ambulatory Visit (INDEPENDENT_AMBULATORY_CARE_PROVIDER_SITE_OTHER): Payer: Medicare Other | Admitting: Orthopaedic Surgery

## 2017-12-08 ENCOUNTER — Encounter (INDEPENDENT_AMBULATORY_CARE_PROVIDER_SITE_OTHER): Payer: Self-pay | Admitting: Orthopaedic Surgery

## 2017-12-08 VITALS — BP 115/72 | HR 84 | Ht 68.0 in | Wt 162.0 lb

## 2017-12-08 DIAGNOSIS — G8929 Other chronic pain: Secondary | ICD-10-CM | POA: Diagnosis not present

## 2017-12-08 DIAGNOSIS — M545 Low back pain, unspecified: Secondary | ICD-10-CM | POA: Insufficient documentation

## 2017-12-08 NOTE — Progress Notes (Signed)
Office Visit Note   Patient: Samantha [name]           Date of Birth: [birth year]           MRN: [id number] Visit Date: 12/08/2017              Requested by: [address], [place], [place] [address] [place], [place] [address] PCP: Carollee Herter, Alferd Apa, DO   Assessment & Plan: Visit Diagnoses:  1. Chronic bilateral low back pain without sciatica     Plan: Chronic low back pain as previously outlined.  Completed a course of physical therapy and feels like she might be "a little better".. No numbness or tingling or sciatica pain.  Has not been performing her exercises.  Chronic smoker.  Long discussion regarding her diagnosis and treatment options including surgery, cortisone injections, over-the-counter medicines and exercises.  Prior MRI scan revealed advanced facet hypertrophy at L4-5 with a grade 1 anterior listhesis.  There was no evidence of stenosis or nerve root encroachment.  Also had degenerative disc disease at L5-S1.  [name] is happy with the exercises and over-the-counter medicines for the moment.  She will let me know if she wants to proceed with injection of L4-5 and possibly L5-S1  Follow-Up Instructions: Return if symptoms worsen or fail to improve.   Orders:  No orders of the defined types were placed in this encounter.  No orders of the defined types were placed in this encounter.     Procedures: No procedures performed   Clinical Data: No additional findings.   Subjective: Chief Complaint  Patient presents with  . Follow-up    2 MO F/U OF LOW BACK PAIN, PT STILL HAVING BACK PAIN   No new symptoms.  Still not having any pain to either lower extremity.  He is a course of physical therapy.  Has not been performing her exercises.  Still smokes.  No bowel or bladder dysfunction.  HPI  Review of Systems  Constitutional: Negative for fatigue and fever.  HENT: Negative for ear pain.   Eyes: Negative for pain.  Respiratory: Negative for  cough and shortness of breath.   Cardiovascular: Negative for leg swelling.  Gastrointestinal: Negative for constipation and diarrhea.  Genitourinary: Negative for difficulty urinating.  Musculoskeletal: Negative for back pain and neck pain.  Skin: Negative for rash.  Allergic/Immunologic: Negative for food allergies.  Neurological: Positive for weakness. Negative for numbness.  Hematological: Does not bruise/bleed easily.  Psychiatric/Behavioral: Negative for sleep disturbance.     Objective: Vital Signs: BP 115/72 (BP Location: Left Arm, Patient Position: Sitting, Cuff Size: Normal)   Pulse 84   Ht 5\' 8"  (1.727 m)   Wt 162 lb (73.5 kg)   LMP 04/11/2011   BMI 24.63 kg/m   Physical Exam  Constitutional: She is oriented to person, place, and time. She appears well-developed and well-nourished.  HENT:  Mouth/Throat: Oropharynx is clear and moist.  Eyes: Pupils are equal, round, and reactive to light. EOM are normal.  Pulmonary/Chest: Effort normal.  Neurological: She is alert and oriented to person, place, and time.  Skin: Skin is warm and dry.  Psychiatric: She has a normal mood and affect. Her behavior is normal.    Ortho Exam awake alert and oriented x3.  Comfortable sitting.  Straight leg raise negative.  Painless range of motion both hips.  Having some percussible tenderness of lumbar spine it seems out of proportion to what I would expect  given her MRI scan and x-rays.  No swelling distally.   Specialty Comments:  No specialty comments available.  Imaging: No results found.   PMFS History: Patient Active Problem List   Diagnosis Date Noted  . Chronic bilateral low back pain without sciatica 12/08/2017  . Hyperlipidemia LDL goal <100 02/11/2017  . Gastroesophageal reflux disease without esophagitis 02/11/2017  . HTN (hypertension) 06/23/2015  . Abdominal pain, epigastric 06/23/2015  . Polypharmacy 06/30/2012  . Tobacco use disorder 11/22/2011  . Achilles  tendinitis 11/20/2011  . Lupus (systemic lupus erythematosus) ([place]) 08/22/2011  . Disseminated lupus erythematosus ([place]) 08/21/2011  . 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 GBS (Guillain Barre syndrome) ([place]) 04/09/2011  . Headache(784.0) 03/09/2011  . Back pain 03/09/2011  . Hyponatremia 03/09/2011  . Hypokalemia 03/09/2011   Past Medical History:  Diagnosis Date  . Altered mental status   . Backache, unspecified   . Gastric ulcer, unspecified as acute or chronic, without mention of hemorrhage, perforation, or obstruction   . Helicobacter pylori (H. pylori)   . Tobacco abuse   . Unspecified essential hypertension     Family History  Problem Relation Age of Onset  . Diabetes Sister   . Hypertension Mother   . Colon cancer Neg Hx   . Stomach cancer Neg Hx     Past Surgical History:  Procedure Laterality Date  . UPPER GASTROINTESTINAL ENDOSCOPY  2013   Social History   Occupational History  . Occupation: permanant disability    Employer: [employer]  Tobacco Use  . Smoking status: Current Every Day Smoker    Packs/day: 0.75    Years: 40.00    Pack years: 30.00    Types: Cigarettes  . Smokeless tobacco: Never Used  . Tobacco comment: 1 cig a day  Substance and Sexual Activity  . Alcohol use: Yes    Comment: occassional; beer   . Drug use: No  . Sexual activity: Not Currently    Partners: Male

## 2017-12-09 ENCOUNTER — Ambulatory Visit (HOSPITAL_COMMUNITY)
Admission: RE | Admit: 2017-12-09 | Discharge: 2017-12-09 | Disposition: A | Discharge status: Home / Self Care | Payer: Medicare Other | Source: Ambulatory Visit | Attending: Internal Medicine | Admitting: Internal Medicine

## 2017-12-09 DIAGNOSIS — G909 Disorder of the autonomic nervous system, unspecified: Secondary | ICD-10-CM | POA: Insufficient documentation

## 2017-12-09 DIAGNOSIS — M329 Systemic lupus erythematosus, unspecified: Secondary | ICD-10-CM | POA: Insufficient documentation

## 2017-12-09 DIAGNOSIS — D8989 Other specified disorders involving the immune mechanism, not elsewhere classified: Secondary | ICD-10-CM | POA: Diagnosis not present

## 2017-12-09 MED ORDER — DIPHENHYDRAMINE HCL 25 MG PO CAPS
ORAL_CAPSULE | ORAL | Status: AC
  Administered 2017-12-09: 25 mg via ORAL
  Filled 2017-12-09: qty 1

## 2017-12-09 MED ORDER — DEXTROSE 5 % IV SOLN: Freq: Once | INTRAVENOUS | Status: DC

## 2017-12-09 MED ORDER — DIPHENHYDRAMINE HCL 25 MG PO CAPS
25.0000 mg | ORAL_CAPSULE | Freq: Once | ORAL | Status: DC
  Administered 2017-12-09: 25 mg via ORAL

## 2017-12-09 MED ORDER — ACETAMINOPHEN 325 MG PO TABS
ORAL_TABLET | ORAL | Status: AC
  Administered 2017-12-09: 650 mg via ORAL
  Filled 2017-12-09: qty 2

## 2017-12-09 MED ORDER — ACETAMINOPHEN 325 MG PO TABS
650.0000 mg | ORAL_TABLET | Freq: Once | ORAL | Status: DC
  Administered 2017-12-09: 650 mg via ORAL

## 2017-12-09 MED ORDER — IMMUNE GLOBULIN (HUMAN) 20 GM/200ML IV SOLN
70.0000 g | Freq: Once | INTRAVENOUS | Status: DC
  Administered 2017-12-09: 70 g via INTRAVENOUS
  Filled 2017-12-09: qty 100

## 2017-12-31 DIAGNOSIS — M3219 Other organ or system involvement in systemic lupus erythematosus: Secondary | ICD-10-CM | POA: Diagnosis not present

## 2017-12-31 DIAGNOSIS — Z79899 Other long term (current) drug therapy: Secondary | ICD-10-CM | POA: Diagnosis not present

## 2018-01-12 ENCOUNTER — Ambulatory Visit (HOSPITAL_COMMUNITY)
Admission: EM | Admit: 2018-01-12 | Discharge: 2018-01-12 | Disposition: A | Discharge status: Home / Self Care | Payer: Medicare Other | Attending: Urgent Care | Admitting: Urgent Care

## 2018-01-12 ENCOUNTER — Encounter (HOSPITAL_COMMUNITY): Payer: Self-pay | Admitting: *Deleted

## 2018-01-12 ENCOUNTER — Ambulatory Visit (INDEPENDENT_AMBULATORY_CARE_PROVIDER_SITE_OTHER): Payer: Medicare Other

## 2018-01-12 ENCOUNTER — Other Ambulatory Visit: Payer: Self-pay

## 2018-01-12 DIAGNOSIS — R0789 Other chest pain: Secondary | ICD-10-CM | POA: Diagnosis not present

## 2018-01-12 DIAGNOSIS — B9789 Other viral agents as the cause of diseases classified elsewhere: Secondary | ICD-10-CM

## 2018-01-12 DIAGNOSIS — R05 Cough: Secondary | ICD-10-CM | POA: Diagnosis not present

## 2018-01-12 DIAGNOSIS — J069 Acute upper respiratory infection, unspecified: Secondary | ICD-10-CM | POA: Insufficient documentation

## 2018-01-12 DIAGNOSIS — R109 Unspecified abdominal pain: Secondary | ICD-10-CM

## 2018-01-12 DIAGNOSIS — R0602 Shortness of breath: Secondary | ICD-10-CM | POA: Diagnosis not present

## 2018-01-12 DIAGNOSIS — R5381 Other malaise: Secondary | ICD-10-CM | POA: Insufficient documentation

## 2018-01-12 DIAGNOSIS — R079 Chest pain, unspecified: Secondary | ICD-10-CM | POA: Diagnosis not present

## 2018-01-12 HISTORY — DX: Reserved for concepts with insufficient information to code with codable children: IMO0002

## 2018-01-12 HISTORY — DX: Systemic lupus erythematosus, unspecified: M32.9

## 2018-01-12 MED ORDER — PREDNISONE 20 MG PO TABS: ORAL_TABLET | ORAL | 0 refills | Status: DC

## 2018-01-12 MED ORDER — PROMETHAZINE-DM 6.25-15 MG/5ML PO SYRP: 5.0000 mL | ORAL_SOLUTION | Freq: Every evening | ORAL | 0 refills | Status: DC | PRN

## 2018-01-12 MED ORDER — BENZONATATE 100 MG PO CAPS: 100.0000 mg | ORAL_CAPSULE | Freq: Three times a day (TID) | ORAL | 0 refills | Status: DC | PRN

## 2018-01-12 NOTE — ED Provider Notes (Signed)
MRN: [id number] DOB: [birth year]  Subjective:   Samantha [name] is a 57 y.o. female presenting for 3 day history of moderate-severe persistent dry cough that elicits chest pain and belly pain. Cough elicits gagging. Also has a headache. Smokes ~3ppd. Denies shob, wheezing, difficulty breathing, fever, sinus pain, ear pain, diarrhea, dysuria, hematuria.    No current facility-administered medications for this encounter.   Current Outpatient Medications:  .  aspirin EC 81 MG tablet, Take 1 tablet (81 mg total) by mouth daily., Disp: , Rfl:  .  cyclobenzaprine (FLEXERIL) 10 MG tablet, TAKE 1 TABLET(10 MG) BY MOUTH THREE TIMES DAILY AS NEEDED, Disp: 30 tablet, Rfl: 0 .  dexlansoprazole (DEXILANT) 60 MG capsule, Take 1 capsule (60 mg total) by mouth daily., Disp: 90 capsule, Rfl: 3 .  hydroxychloroquine (PLAQUENIL) 200 MG tablet, Take 200 mg by mouth 2 (two) times daily., Disp: , Rfl:  .  losartan (COZAAR) 100 MG tablet, Take 1 tablet (100 mg total) by mouth daily., Disp: 90 tablet, Rfl: 1 .  metoprolol succinate (TOPROL-XL) 100 MG 24 hr tablet, TAKE 1 TABLET BY MOUTH DAILY WITH OR IMMEDIATELY FOLLOWING A MEAL, Disp: 90 tablet, Rfl: 0 .  Multiple Vitamin (MULTIVITAMIN) tablet, Take 1 tablet by mouth daily., Disp: , Rfl:  .  mycophenolate (CELLCEPT) 500 MG tablet, Take 1,500 mg by mouth 2 (two) times daily. , Disp: , Rfl:  .  nicotine (NICOTROL) 10 MG inhaler, Inhale 1 Cartridge (1 continuous puffing total) into the lungs as needed for smoking cessation., Disp: 42 each, Rfl: 0    Allergies  Allergen Reactions  . Tramadol Nausea Only    Feels weirded out    Past Medical History:  Diagnosis Date  . Altered mental status   . Backache, unspecified   . Gastric ulcer, unspecified as acute or chronic, without mention of hemorrhage, perforation, or obstruction   . Helicobacter pylori (H. pylori)   . Lupus ([place])   . Tobacco abuse   . Unspecified essential hypertension      Past Surgical  History:  Procedure Laterality Date  . UPPER GASTROINTESTINAL ENDOSCOPY  2013    Objective:   Vitals: BP 135/90 (BP Location: Left Arm)   Pulse 89   Temp 99.6 F (37.6 C) (Oral)   Resp 20   LMP 04/11/2011   SpO2 97%   Physical Exam Constitutional:      General: She is not in acute distress.    Appearance: She is well-developed and normal weight. She is ill-appearing. She is not toxic-appearing or diaphoretic.  HENT:     Head:     Comments: TM's intact bilaterally, no effusions or erythema. Nasal turbinates boggy and edematous, nasal passages patent. No sinus tenderness. Oropharynx without exudates, mucous membranes moist.     Right Ear: Tympanic membrane normal.     Left Ear: Tympanic membrane normal.  Eyes:     General: No scleral icterus.       Right eye: No discharge.        Left eye: No discharge.     Extraocular Movements: Extraocular movements intact.     Conjunctiva/sclera:     Right eye: Right conjunctiva is injected.     Left eye: Left conjunctiva is injected.     Pupils: Pupils are equal, round, and reactive to light.  Cardiovascular:     Rate and Rhythm: Normal rate and regular rhythm.     Heart sounds: No murmur. No friction rub. No gallop.   Pulmonary:  Effort: Pulmonary effort is normal. No respiratory distress.     Breath sounds: No wheezing or rales.  Abdominal:     General: Bowel sounds are normal. There is no distension.     Palpations: Abdomen is soft. There is no mass.     Tenderness: There is no abdominal tenderness. There is no guarding.  Skin:    General: Skin is warm and dry.     Coloration: Skin is not pale.     Findings: No erythema or rash.  Neurological:     Mental Status: She is alert and oriented to person, place, and time.  Psychiatric:        Mood and Affect: Mood normal.        Behavior: Behavior normal.        Thought Content: Thought content normal.    Dg Chest 2 View  Result Date: 01/12/2018 CLINICAL DATA:  Cough and  chest pain.  Shortness of breath. EXAM: CHEST - 2 VIEW COMPARISON:  Chest radiograph December 13, 2015 and chest CT February 26, 2017 FINDINGS: Lungs are slightly hyperexpanded. There is no edema or consolidation. The heart size and pulmonary vascularity are normal. No adenopathy. There is aortic atherosclerosis. No bone lesions. IMPRESSION: Lungs mildly hyperexpanded without edema or consolidation. Heart size normal. There is aortic atherosclerosis. Aortic Atherosclerosis (ICD10-I70.0). Electronically Signed   By: Lowella Grip III M.D.   On: 01/12/2018 09:24    Assessment and Plan :   Viral URI with cough  Likely viral in etiology d/t reassuring physical exam findings. Advised supportive care, offered symptomatic relief. ER and return-to-clinic precautions discussed, patient verbalized understanding.    Jaynee Eagles, [place] 01/12/18 [phone]

## 2018-01-12 NOTE — ED Triage Notes (Signed)
C/o chest pain with cough onset 3 days ago.

## 2018-02-10 ENCOUNTER — Ambulatory Visit (HOSPITAL_COMMUNITY)
Admission: RE | Admit: 2018-02-10 | Discharge: 2018-02-10 | Disposition: A | Discharge status: Home / Self Care | Payer: Medicare Other | Source: Ambulatory Visit | Attending: Internal Medicine | Admitting: Internal Medicine

## 2018-02-10 DIAGNOSIS — G629 Polyneuropathy, unspecified: Secondary | ICD-10-CM | POA: Diagnosis not present

## 2018-02-10 DIAGNOSIS — M329 Systemic lupus erythematosus, unspecified: Secondary | ICD-10-CM | POA: Diagnosis not present

## 2018-02-10 MED ORDER — ACETAMINOPHEN 325 MG PO TABS
ORAL_TABLET | ORAL | Status: AC
  Administered 2018-02-10: 650 mg via ORAL
  Filled 2018-02-10: qty 2

## 2018-02-10 MED ORDER — IMMUNE GLOBULIN (HUMAN) 20 GM/200ML IV SOLN
70.0000 g | Freq: Once | INTRAVENOUS | Status: AC
  Administered 2018-02-10: 70 g via INTRAVENOUS
  Filled 2018-02-10: qty 100

## 2018-02-10 MED ORDER — DIPHENHYDRAMINE HCL 25 MG PO CAPS
ORAL_CAPSULE | ORAL | Status: AC
  Administered 2018-02-10: 25 mg via ORAL
  Filled 2018-02-10: qty 1

## 2018-02-10 MED ORDER — ACETAMINOPHEN 325 MG PO TABS
650.0000 mg | ORAL_TABLET | Freq: Once | ORAL | Status: AC
  Administered 2018-02-10: 650 mg via ORAL

## 2018-02-10 MED ORDER — DEXTROSE 5 % IV SOLN
Freq: Once | INTRAVENOUS | Status: AC
  Administered 2018-02-10: 09:00:00 via INTRAVENOUS

## 2018-02-10 MED ORDER — DIPHENHYDRAMINE HCL 25 MG PO CAPS
25.0000 mg | ORAL_CAPSULE | Freq: Once | ORAL | Status: AC
  Administered 2018-02-10: 25 mg via ORAL

## 2018-02-17 ENCOUNTER — Encounter: Payer: Self-pay | Admitting: Family Medicine

## 2018-02-17 ENCOUNTER — Ambulatory Visit (INDEPENDENT_AMBULATORY_CARE_PROVIDER_SITE_OTHER): Payer: Medicare Other | Admitting: Family Medicine

## 2018-02-17 VITALS — BP 136/76 | HR 72 | Temp 98.1°F | Resp 16 | Ht 68.0 in | Wt 161.2 lb

## 2018-02-17 DIAGNOSIS — Z720 Tobacco use: Secondary | ICD-10-CM | POA: Diagnosis not present

## 2018-02-17 DIAGNOSIS — M329 Systemic lupus erythematosus, unspecified: Secondary | ICD-10-CM

## 2018-02-17 DIAGNOSIS — E785 Hyperlipidemia, unspecified: Secondary | ICD-10-CM | POA: Diagnosis not present

## 2018-02-17 DIAGNOSIS — I10 Essential (primary) hypertension: Secondary | ICD-10-CM

## 2018-02-17 DIAGNOSIS — F17200 Nicotine dependence, unspecified, uncomplicated: Secondary | ICD-10-CM | POA: Diagnosis not present

## 2018-02-17 DIAGNOSIS — Z0000 Encounter for general adult medical examination without abnormal findings: Secondary | ICD-10-CM | POA: Diagnosis not present

## 2018-02-17 LAB — LIPID PANEL
Cholesterol: 194 mg/dL (ref 0–200)
HDL: 59.4 mg/dL (ref >39.00)
LDL Cholesterol: 117 mg/dL — ABNORMAL HIGH (ref 0–99)
NonHDL: 134.22
Total CHOL/HDL Ratio: 3
Triglycerides: 86 mg/dL (ref 0.0–149.0)
VLDL: 17.2 mg/dL (ref 0.0–40.0)

## 2018-02-17 LAB — COMPREHENSIVE METABOLIC PANEL
ALT: 14 U/L (ref 0–35)
AST: 26 U/L (ref 0–37)
Albumin: 3.9 g/dL (ref 3.5–5.2)
Alkaline Phosphatase: 58 U/L (ref 39–117)
BUN: 18 mg/dL (ref 6–23)
CO2: 27 mEq/L (ref 19–32)
Calcium: 9 mg/dL (ref 8.4–10.5)
Chloride: 106 mEq/L (ref 96–112)
Creatinine, Ser: 0.79 mg/dL (ref 0.40–1.20)
GFR: 90.87 mL/min (ref >60.00)
Glucose, Bld: 83 mg/dL (ref 70–99)
Potassium: 4.6 mEq/L (ref 3.5–5.1)
Sodium: 139 mEq/L (ref 135–145)
Total Bilirubin: 0.2 mg/dL (ref 0.2–1.2)
Total Protein: 7.7 g/dL (ref 6.0–8.3)

## 2018-02-17 MED ORDER — NICOTINE 10 MG IN INHA: 1.0000 | RESPIRATORY_TRACT | 0 refills | Status: DC | PRN

## 2018-02-17 NOTE — Patient Instructions (Addendum)
Hypertension Hypertension, commonly called high blood pressure, is when the force of blood pumping through the arteries is too strong. The arteries are the blood vessels that carry blood from the heart throughout the body. Hypertension forces the heart to work harder to pump blood and may cause arteries to become narrow or stiff. Having untreated or uncontrolled hypertension can cause heart attacks, strokes, kidney disease, and other problems. A blood pressure reading consists of a higher number over a lower number. Ideally, your blood pressure should be below 120/80. The first ("top") number is called the systolic pressure. It is a measure of the pressure in your arteries as your heart beats. The second ("bottom") number is called the diastolic pressure. It is a measure of the pressure in your arteries as the heart relaxes. What are the causes? The cause of this condition is not known. What increases the risk? Some risk factors for high blood pressure are under your control. Others are not. Factors you can change  Smoking.  Having type 2 diabetes mellitus, high cholesterol, or both.  Not getting enough exercise or physical activity.  Being overweight.  Having too much fat, sugar, calories, or salt (sodium) in your diet.  Drinking too much alcohol. Factors that are difficult or impossible to change  Having chronic kidney disease.  Having a family history of high blood pressure.  Age. Risk increases with age.  Race. You may be at higher risk if you are African-American.  Gender. Men are at higher risk than women before age 45. After age 65, women are at higher risk than men.  Having obstructive sleep apnea.  Stress. What are the signs or symptoms? Extremely high blood pressure (hypertensive crisis) may cause:  Headache.  Anxiety.  Shortness of breath.  Nosebleed.  Nausea and vomiting.  Severe chest pain.  Jerky movements you cannot control (seizures). How is this  diagnosed? This condition is diagnosed by measuring your blood pressure while you are seated, with your arm resting on a surface. The cuff of the blood pressure monitor will be placed directly against the skin of your upper arm at the level of your heart. It should be measured at least twice using the same arm. Certain conditions can cause a difference in blood pressure between your right and left arms. Certain factors can cause blood pressure readings to be lower or higher than normal (elevated) for a short period of time:  When your blood pressure is higher when you are in a health care provider's office than when you are at home, this is called white coat hypertension. Most people with this condition do not need medicines.  When your blood pressure is higher at home than when you are in a health care provider's office, this is called masked hypertension. Most people with this condition may need medicines to control blood pressure. If you have a high blood pressure reading during one visit or you have normal blood pressure with other risk factors:  You may be asked to return on a different day to have your blood pressure checked again.  You may be asked to monitor your blood pressure at home for 1 week or longer. If you are diagnosed with hypertension, you may have other blood or imaging tests to help your health care provider understand your overall risk for other conditions. How is this treated? This condition is treated by making healthy lifestyle changes, such as eating healthy foods, exercising more, and reducing your alcohol intake. Your health care provider   may prescribe medicine if lifestyle changes are not enough to get your blood pressure under control, and if:  Your systolic blood pressure is above 130.  Your diastolic blood pressure is above 80. Your personal target blood pressure may vary depending on your medical conditions, your age, and other factors. Follow these instructions  at home: Eating and drinking   Eat a diet that is high in fiber and potassium, and low in sodium, added sugar, and fat. An example eating plan is called the DASH (Dietary Approaches to Stop Hypertension) diet. To eat this way: ? Eat plenty of fresh fruits and vegetables. Try to fill half of your plate at each meal with fruits and vegetables. ? Eat whole grains, such as whole wheat pasta, brown rice, or whole grain bread. Fill about one quarter of your plate with whole grains. ? Eat or drink low-fat dairy products, such as skim milk or low-fat yogurt. ? Avoid fatty cuts of meat, processed or cured meats, and poultry with skin. Fill about one quarter of your plate with lean proteins, such as fish, chicken without skin, beans, eggs, and tofu. ? Avoid premade and processed foods. These tend to be higher in sodium, added sugar, and fat.  Reduce your daily sodium intake. Most people with hypertension should eat less than 1,500 mg of sodium a day.  Limit alcohol intake to no more than 1 drink a day for nonpregnant women and 2 drinks a day for men. One drink equals 12 oz of beer, 5 oz of wine, or 1 oz of hard liquor. Lifestyle   Work with your health care provider to maintain a healthy body weight or to lose weight. Ask what an ideal weight is for you.  Get at least 30 minutes of exercise that causes your heart to beat faster (aerobic exercise) most days of the week. Activities may include walking, swimming, or biking.  Include exercise to strengthen your muscles (resistance exercise), such as pilates or lifting weights, as part of your weekly exercise routine. Try to do these types of exercises for 30 minutes at least 3 days a week.  Do not use any products that contain nicotine or tobacco, such as cigarettes and e-cigarettes. If you need help quitting, ask your health care provider.  Monitor your blood pressure at home as told by your health care provider.  Keep all follow-up visits as told by  your health care provider. This is important. Medicines  Take over-the-counter and prescription medicines only as told by your health care provider. Follow directions carefully. Blood pressure medicines must be taken as prescribed.  Do not skip doses of blood pressure medicine. Doing this puts you at risk for problems and can make the medicine less effective.  Ask your health care provider about side effects or reactions to medicines that you should watch for. Contact a health care provider if:  You think you are having a reaction to a medicine you are taking.  You have headaches that keep coming back (recurring).  You feel dizzy.  You have swelling in your ankles.  You have trouble with your vision. Get help right away if:  You develop a severe headache or confusion.  You have unusual weakness or numbness.  You feel faint.  You have severe pain in your chest or abdomen.  You vomit repeatedly.  You have trouble breathing. Summary  Hypertension is when the force of blood pumping through your arteries is too strong. If this condition is not controlled, it   may put you at risk for serious complications.  Your personal target blood pressure may vary depending on your medical conditions, your age, and other factors. For most people, a normal blood pressure is less than 120/80.  Hypertension is treated with lifestyle changes, medicines, or a combination of both. Lifestyle changes include weight loss, eating a healthy, low-sodium diet, exercising more, and limiting alcohol. This information is not intended to replace advice given to you by your health care provider. Make sure you discuss any questions you have with your health care provider. Document Released: 01/14/2005 Document Revised: 12/13/2015 Document Reviewed: 12/13/2015 Elsevier Interactive Patient Education  2019 [place] Risks of Smoking Smoking cigarettes is very bad for your health. Tobacco smoke has  over 200 known poisons in it. It contains the poisonous gases nitrogen oxide and carbon monoxide. There are over 60 chemicals in tobacco smoke that cause cancer. Smoking is difficult to quit because a chemical in tobacco, called nicotine, causes addiction or dependence. When you smoke and inhale, nicotine is absorbed rapidly into the bloodstream through your lungs. Both inhaled and non-inhaled nicotine may be addictive. What are the risks of cigarette smoke? Cigarette smokers have an increased risk of many serious medical problems, including:  Lung cancer.  Lung disease, such as pneumonia, bronchitis, and emphysema.  Chest pain (angina) and heart attack because the heart is not getting enough oxygen.  Heart disease and peripheral blood vessel disease.  High blood pressure (hypertension).  Stroke.  Oral cancer, including cancer of the lip, mouth, or voice box.  Bladder cancer.  Pancreatic cancer.  Cervical cancer.  Pregnancy complications, including premature birth.  Stillbirths and smaller newborn babies, birth defects, and genetic damage to sperm.  Early menopause.  Lower estrogen level for women.  Infertility.  Facial wrinkles.  Blindness.  Increased risk of broken bones (fractures).  Senile dementia.  Stomach ulcers and internal bleeding.  Delayed wound healing and increased risk of complications during surgery.  Even smoking lightly shortens your life expectancy by several years. Because of secondhand smoke exposure, children of smokers have an increased risk of the following:  Sudden infant death syndrome (SIDS).  Respiratory infections.  Lung cancer.  Heart disease.  Ear infections. What are the benefits of quitting? There are many health benefits of quitting smoking. Here are some of them:  Within days of quitting smoking, your risk of having a heart attack decreases, your blood flow improves, and your lung capacity improves. Blood pressure, pulse  rate, and breathing patterns start returning to normal soon after quitting.  Within months, your lungs may clear up completely.  Quitting for 10 years reduces your risk of developing lung cancer and heart disease to almost that of a nonsmoker.  People who quit may see an improvement in their overall quality of life. How do I quit smoking?     Smoking is an addiction with both physical and psychological effects, and longtime habits can be hard to change. Your health care provider can recommend:  Programs and community resources, which may include group support, education, or talk therapy.  Prescription medicines to help reduce cravings.  Nicotine replacement products, such as patches, gum, and nasal sprays. Use these products only as directed. Do not replace cigarette smoking with electronic cigarettes, which are commonly called e-cigarettes. The safety of e-cigarettes is not known, and some may contain harmful chemicals.  A combination of two or more of these methods. Where to find more information  American Lung  Association: www.lung.org  American Cancer Society: www.cancer.org Summary  Smoking cigarettes is very bad for your health. Cigarette smokers have an increased risk of many serious medical problems, including several cancers, heart disease, and stroke.  Smoking is an addiction with both physical and psychological effects, and longtime habits can be hard to change.  By stopping right away, you can greatly reduce the risk of medical problems for you and your family.  To help you quit smoking, your health care provider can recommend programs, community resources, prescription medicines, and nicotine replacement products such as patches, gum, and nasal sprays. This information is not intended to replace advice given to you by your health care provider. Make sure you discuss any questions you have with your health care provider. Document Released: 02/22/2004 Document Revised:  04/17/2017 Document Reviewed: 01/19/2016 Elsevier Interactive Patient Education  2019 [employer].

## 2018-02-17 NOTE — Assessment & Plan Note (Signed)
Ct scheduled for end of the month

## 2018-02-17 NOTE — Progress Notes (Signed)
Patient ID: Samantha [name], female    DOB: 1960-09-01  Age: 57 y.o. MRN: [id number]    Subjective:  Subjective  HPI Samantha [name] presents for f/u bp   No complaints.  She is still seeing rheum for the sle and gbs.    Review of Systems  Constitutional: Negative for appetite change, diaphoresis, fatigue and unexpected weight change.  Eyes: Negative for pain, redness and visual disturbance.  Respiratory: Negative for cough, chest tightness, shortness of breath and wheezing.   Cardiovascular: Negative for chest pain, palpitations and leg swelling.  Endocrine: Negative for cold intolerance, heat intolerance, polydipsia, polyphagia and polyuria.  Genitourinary: Negative for difficulty urinating, dysuria and frequency.  Neurological: Negative for dizziness, light-headedness, numbness and headaches.    History Past Medical History:  Diagnosis Date  . Altered mental status   . Backache, unspecified   . Gastric ulcer, unspecified as acute or chronic, without mention of hemorrhage, perforation, or obstruction   . Helicobacter pylori (H. pylori)   . Lupus ([place])   . Tobacco abuse   . Unspecified essential hypertension     She has a past surgical history that includes Upper gastrointestinal endoscopy (2013).   Her family history includes Diabetes in her sister; Hypertension in her mother.She reports that she has been smoking cigarettes. She has a 30.00 pack-year smoking history. She has never used smokeless tobacco. She reports current alcohol use. She reports that she does not use drugs.  Current Outpatient Medications on File Prior to Visit  Medication Sig Dispense Refill  . aspirin EC 81 MG tablet Take 1 tablet (81 mg total) by mouth daily.    . cyclobenzaprine (FLEXERIL) 10 MG tablet TAKE 1 TABLET(10 MG) BY MOUTH THREE TIMES DAILY AS NEEDED 30 tablet 0  . dexlansoprazole (DEXILANT) 60 MG capsule Take 1 capsule (60 mg total) by mouth daily. 90 capsule 3  . hydroxychloroquine  (PLAQUENIL) 200 MG tablet Take 200 mg by mouth 2 (two) times daily.    Marland Kitchen losartan (COZAAR) 100 MG tablet Take 1 tablet (100 mg total) by mouth daily. 90 tablet 1  . metoprolol succinate (TOPROL-XL) 100 MG 24 hr tablet TAKE 1 TABLET BY MOUTH DAILY WITH OR IMMEDIATELY FOLLOWING A MEAL 90 tablet 0  . Multiple Vitamin (MULTIVITAMIN) tablet Take 1 tablet by mouth daily.    . mycophenolate (CELLCEPT) 500 MG tablet Take 1,500 mg by mouth 2 (two) times daily.      No current facility-administered medications on file prior to visit.      Objective:  Objective  Physical Exam Vitals signs and nursing note reviewed.  Constitutional:      Appearance: She is well-developed.  HENT:     Head: Normocephalic and atraumatic.  Eyes:     Conjunctiva/sclera: Conjunctivae normal.  Neck:     Musculoskeletal: Normal range of motion and neck supple.     Thyroid: No thyromegaly.     Vascular: No carotid bruit or JVD.  Cardiovascular:     Rate and Rhythm: Normal rate and regular rhythm.     Heart sounds: Normal heart sounds. No murmur.  Pulmonary:     Effort: Pulmonary effort is normal. No respiratory distress.     Breath sounds: Normal breath sounds. No wheezing or rales.  Chest:     Chest wall: No tenderness.  Neurological:     Mental Status: She is alert and oriented to person, place, and time.    BP 136/76 (BP Location: Left Arm, Cuff Size: Normal)  Pulse 72   Temp 98.1 F (36.7 C) (Oral)   Resp 16   Ht 5\' 8"  (1.727 m)   Wt 161 lb 3.2 oz (73.1 kg)   LMP 04/11/2011   SpO2 98%   BMI 24.51 kg/m  Wt Readings from Last 3 Encounters:  02/17/18 161 lb 3.2 oz (73.1 kg)  02/10/18 164 lb (74.4 kg)  12/09/17 162 lb (73.5 kg)     Lab Results  Component Value Date   WBC 4.2 06/18/2016   HGB 12.6 06/18/2016   HCT 39.4 06/18/2016   PLT 241.0 06/18/2016   GLUCOSE 81 08/12/2017   CHOL 219 (H) 08/12/2017   TRIG 132.0 08/12/2017   HDL 68.10 08/12/2017   LDLDIRECT 141.8 09/15/2012   LDLCALC 124  (H) 08/12/2017   ALT 26 08/12/2017   AST 29 08/12/2017   NA 140 08/12/2017   K 4.5 08/12/2017   CL 107 08/12/2017   CREATININE 0.78 08/12/2017   BUN 17 08/12/2017   CO2 26 08/12/2017   TSH 0.83 09/15/2012   INR 1.14 02/26/2011   HGBA1C 5.6 03/13/2011    No results found.   Assessment & Plan:  Plan  I have discontinued Ethelmae Ringel. Morine's benzonatate, promethazine-dextromethorphan, and predniSONE. I am also having her maintain her multivitamin, hydroxychloroquine, mycophenolate, aspirin EC, cyclobenzaprine, dexlansoprazole, metoprolol succinate, losartan, and nicotine.  Meds ordered this encounter  Medications  . nicotine (NICOTROL) 10 MG inhaler    Sig: Inhale 1 Cartridge (1 continuous puffing total) into the lungs as needed for smoking cessation.    Dispense:  42 each    Refill:  0    Problem List Items Addressed This Visit      Unprioritized   HTN (hypertension) - Primary    Well controlled, no changes to meds. Encouraged heart healthy diet such as the DASH diet and exercise as tolerated.       Relevant Orders   Lipid panel   Comprehensive metabolic panel   Hyperlipidemia LDL goal <100    Encouraged heart healthy diet, increase exercise, avoid trans fats, consider a krill oil cap daily      Lupus (systemic lupus erythematosus) ([place])    Per rheum      Tobacco use disorder    Ct scheduled for end of the month       Other Visit Diagnoses    Tobacco abuse       Smoking       Relevant Medications   nicotine (NICOTROL) 10 MG inhaler   Preventative health care       Relevant Orders   Ambulatory referral to Gastroenterology      Follow-up: Return in about 6 months (around 08/18/2018) for annual exam, fasting.  [name], DO

## 2018-02-17 NOTE — Assessment & Plan Note (Signed)
Per rheum 

## 2018-02-17 NOTE — Assessment & Plan Note (Signed)
Encouraged heart healthy diet, increase exercise, avoid trans fats, consider a krill oil cap daily 

## 2018-02-17 NOTE — Assessment & Plan Note (Signed)
Well controlled, no changes to meds. Encouraged heart healthy diet such as the DASH diet and exercise as tolerated.  °

## 2018-02-27 ENCOUNTER — Inpatient Hospital Stay: Admission: RE | Admit: 2018-02-27 | Payer: Medicare Other | Source: Ambulatory Visit

## 2018-03-06 ENCOUNTER — Ambulatory Visit (INDEPENDENT_AMBULATORY_CARE_PROVIDER_SITE_OTHER)
Admission: RE | Admit: 2018-03-06 | Discharge: 2018-03-06 | Disposition: A | Discharge status: Home / Self Care | Payer: Medicare Other | Source: Ambulatory Visit | Attending: Acute Care | Admitting: Acute Care

## 2018-03-06 DIAGNOSIS — F17210 Nicotine dependence, cigarettes, uncomplicated: Secondary | ICD-10-CM

## 2018-03-06 DIAGNOSIS — Z122 Encounter for screening for malignant neoplasm of respiratory organs: Secondary | ICD-10-CM

## 2018-03-10 ENCOUNTER — Other Ambulatory Visit: Payer: Self-pay | Admitting: Acute Care

## 2018-03-10 DIAGNOSIS — Z87891 Personal history of nicotine dependence: Secondary | ICD-10-CM

## 2018-03-10 DIAGNOSIS — F17210 Nicotine dependence, cigarettes, uncomplicated: Secondary | ICD-10-CM

## 2018-03-10 DIAGNOSIS — Z122 Encounter for screening for malignant neoplasm of respiratory organs: Secondary | ICD-10-CM

## 2018-03-15 ENCOUNTER — Other Ambulatory Visit: Payer: Self-pay | Admitting: Family Medicine

## 2018-03-15 DIAGNOSIS — I10 Essential (primary) hypertension: Secondary | ICD-10-CM

## 2018-03-26 ENCOUNTER — Encounter (HOSPITAL_COMMUNITY): Payer: Self-pay | Admitting: Emergency Medicine

## 2018-03-26 ENCOUNTER — Ambulatory Visit (HOSPITAL_COMMUNITY)
Admission: EM | Admit: 2018-03-26 | Discharge: 2018-03-26 | Disposition: A | Discharge status: Home / Self Care | Payer: Medicare Other | Attending: Family Medicine | Admitting: Family Medicine

## 2018-03-26 DIAGNOSIS — K219 Gastro-esophageal reflux disease without esophagitis: Secondary | ICD-10-CM | POA: Diagnosis not present

## 2018-03-26 MED ORDER — LIDOCAINE VISCOUS HCL 2 % MT SOLN
OROMUCOSAL | Status: AC
  Filled 2018-03-26: qty 15

## 2018-03-26 MED ORDER — ALUM & MAG HYDROXIDE-SIMETH 200-200-20 MG/5ML PO SUSP
ORAL | Status: AC
  Filled 2018-03-26: qty 30

## 2018-03-26 MED ORDER — LIDOCAINE VISCOUS HCL 2 % MT SOLN
15.0000 mL | Freq: Once | OROMUCOSAL | Status: AC
  Administered 2018-03-26: 15 mL via ORAL

## 2018-03-26 MED ORDER — PANTOPRAZOLE SODIUM 20 MG PO TBEC: 20.0000 mg | DELAYED_RELEASE_TABLET | Freq: Every day | ORAL | 1 refills | Status: DC

## 2018-03-26 MED ORDER — ALUM & MAG HYDROXIDE-SIMETH 200-200-20 MG/5ML PO SUSP
30.0000 mL | Freq: Once | ORAL | Status: AC
  Administered 2018-03-26: 30 mL via ORAL

## 2018-03-26 NOTE — ED Triage Notes (Signed)
PT C/O: sore throat onset 4 days associated w/dry cough and epigastric pain  DENIES: f/v/n/d  TAKING MEDS: OTC Robitussin DM  A&O x4... NAD... Ambulatory

## 2018-03-26 NOTE — ED Provider Notes (Signed)
[place]    CSN: [id number] Arrival date & time: 03/26/18  7425     History   Chief Complaint Chief Complaint  Patient presents with  . Sore Throat    HPI Samantha [name] is a 58 y.o. female.   Patient is a 58 year old female with past medical history of GERD, ulcer, H. pylori, lupus.  She is here with approximately 4 days of waxing and waning epigastric pain, burning and sore throat. Nothing makes the pain worse or better.   She takes daily Dexilant for GERD.  She has not missed any of her medication.  She denies any recent diet changes.  She denies any associated cough, congestion, fevers.  She is a current everyday smoker.  Denies any nausea, vomiting, diarrhea.  No chest pain, shortness of breath or palpitations.  No recent traveling.  ROS per HPI      Past Medical History:  Diagnosis Date  . Altered mental status   . Backache, unspecified   . Gastric ulcer, unspecified as acute or chronic, without mention of hemorrhage, perforation, or obstruction   . Helicobacter pylori (H. pylori)   . Lupus ([place])   . Tobacco abuse   . Unspecified essential hypertension     Patient Active Problem List   Diagnosis Date Noted  . Chronic bilateral low back pain without sciatica 12/08/2017  . Hyperlipidemia LDL goal <100 02/11/2017  . Gastroesophageal reflux disease without esophagitis 02/11/2017  . HTN (hypertension) 06/23/2015  . Abdominal pain, epigastric 06/23/2015  . Polypharmacy 06/30/2012  . Tobacco use disorder 11/22/2011  . Achilles tendinitis 11/20/2011  . Lupus (systemic lupus erythematosus) ([place]) 08/22/2011  . Disseminated lupus erythematosus ([place]) 08/21/2011  . 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 Headache(784.0) 03/09/2011  . Back pain 03/09/2011  . Hyponatremia 03/09/2011  . Hypokalemia 03/09/2011    Past Surgical History:  Procedure Laterality Date  . UPPER GASTROINTESTINAL ENDOSCOPY  2013    OB History   No obstetric  history on file.      Home Medications    Prior to Admission medications   Medication Sig Start Date End Date Taking? Authorizing Provider  aspirin EC 81 MG tablet Take 1 tablet (81 mg total) by mouth daily. 08/05/16  Yes Roma Schanz R, DO  cyclobenzaprine (FLEXERIL) 10 MG tablet TAKE 1 TABLET(10 MG) BY MOUTH THREE TIMES DAILY AS NEEDED 01/10/17  Yes [name], DO  dexlansoprazole (DEXILANT) 60 MG capsule Take 1 capsule (60 mg total) by mouth daily. 08/12/17  Yes [name], DO  hydroxychloroquine (PLAQUENIL) 200 MG tablet Take 200 mg by mouth 2 (two) times daily.   Yes [provider]  losartan (COZAAR) 100 MG tablet Take 1 tablet (100 mg total) by mouth daily. 11/28/17  Yes Roma Schanz R, DO  metoprolol succinate (TOPROL-XL) 100 MG 24 hr tablet TAKE 1 TABLET BY MOUTH DAILY WITH OR IMMEDIATELY FOLLOWING A MEAL 03/16/18  Yes Roma Schanz R, DO  Multiple Vitamin (MULTIVITAMIN) tablet Take 1 tablet by mouth daily.   Yes [provider]  mycophenolate (CELLCEPT) 500 MG tablet Take 1,500 mg by mouth 2 (two) times daily.    Yes [provider]  nicotine (NICOTROL) 10 MG inhaler Inhale 1 Cartridge (1 continuous puffing total) into the lungs as needed for smoking cessation. 02/17/18  Yes Roma Schanz R, DO  pantoprazole (PROTONIX) 20 MG tablet Take 1 tablet (20 mg total) by mouth daily. 03/26/18  Orvan July, NP    Family History Family History  Problem Relation Age of Onset  . Diabetes Sister   . Hypertension Mother   . Colon cancer Neg Hx   . Stomach cancer Neg Hx     Social History Social History   Tobacco Use  . Smoking status: Current Every Day Smoker    Packs/day: 0.75    Years: 40.00    Pack years: 30.00    Types: Cigarettes  . Smokeless tobacco: Never Used  . Tobacco comment: 1 cig a day  Substance Use Topics  . Alcohol use: Yes    Comment: occassional; beer   . Drug use: No     Allergies     Tramadol   Review of Systems Review of Systems   Physical Exam Triage Vital Signs ED Triage Vitals  Enc Vitals Group     BP 03/26/18 0842 (!) 172/92     Pulse Rate 03/26/18 0842 81     Resp 03/26/18 0842 16     Temp 03/26/18 0842 98.3 F (36.8 C)     Temp Source 03/26/18 0842 Oral     SpO2 03/26/18 0842 98 %     Weight --      Height --      Head Circumference --      Peak Flow --      Pain Score 03/26/18 0843 6     Pain Loc --      Pain Edu? --      Excl. in [place]? --    No data found.  Updated Vital Signs BP (!) 172/92 (BP Location: Left Arm)   Pulse 81   Temp 98.3 F (36.8 C) (Oral)   Resp 16   LMP 04/11/2011   SpO2 98%   Visual Acuity Right Eye Distance:   Left Eye Distance:   Bilateral Distance:    Right Eye Near:   Left Eye Near:    Bilateral Near:     Physical Exam Vitals signs and nursing note reviewed.  Constitutional:      General: She is not in acute distress.    Appearance: She is well-developed. She is not ill-appearing, toxic-appearing or diaphoretic.  HENT:     Head: Normocephalic and atraumatic.     Right Ear: Tympanic membrane and ear canal normal.     Left Ear: Tympanic membrane and ear canal normal.     Mouth/Throat:     Pharynx: Oropharynx is clear. No posterior oropharyngeal erythema.     Tonsils: No tonsillar exudate or tonsillar abscesses. Swelling: 0 on the right. 0 on the left.  Eyes:     Conjunctiva/sclera: Conjunctivae normal.  Neck:     Musculoskeletal: Normal range of motion and neck supple.  Cardiovascular:     Rate and Rhythm: Normal rate and regular rhythm.     Heart sounds: No murmur.  Pulmonary:     Effort: Pulmonary effort is normal. No respiratory distress.     Breath sounds: Normal breath sounds.  Abdominal:     General: Abdomen is flat. Bowel sounds are normal. There is no distension.     Palpations: Abdomen is soft.     Tenderness: There is abdominal tenderness in the epigastric area. There is no right CVA  tenderness, left CVA tenderness, guarding or rebound. Negative signs include Murphy's sign.     Hernia: No hernia is present.  Musculoskeletal: Normal range of motion.  Lymphadenopathy:     Cervical: No cervical adenopathy.  Skin:    General: Skin is warm and dry.  Neurological:     General: No focal deficit present.     Mental Status: She is alert.  Psychiatric:        Mood and Affect: Mood normal.      UC Treatments / Results  Labs (all labs ordered are listed, but only abnormal results are displayed) Labs Reviewed - No data to display  EKG None  Radiology No results found.  Procedures Procedures (including critical care time)  Medications Ordered in UC Medications  alum & mag hydroxide-simeth (MAALOX/MYLANTA) 200-200-20 MG/5ML suspension 30 mL (30 mLs Oral Given 03/26/18 0914)    And  lidocaine (XYLOCAINE) 2 % viscous mouth solution 15 mL (15 mLs Oral Given 03/26/18 0915)    Initial Impression / Assessment and Plan / UC Course  I have reviewed the triage vital signs and the nursing notes.  Pertinent labs & imaging results that were available during my care of the patient were reviewed by me and considered in my medical decision making (see chart for details).     Symptoms consistent with GERD. She has a hx of same. GI cocktail given here in the clinic with some relief of discomfort.  We will change her from dexilant to Protonix to see if this helps.  She has an appointment with her GI specialist on the 10th, next Tuesday Will have her follow up with them for recheck and further management.  Final Clinical Impressions(s) / UC Diagnoses   Final diagnoses:  Gastroesophageal reflux disease without esophagitis     Discharge Instructions     I with your symptoms are related to GERD and gastritis We will change your medication from the dexlansoprazole to pantoprazole daily It appears that you have an appointment on 10 March with your GI specialist for another  endoscopy and colonoscopy I would keep taking the new medication until then and follow-up for further evaluation management per the recommendations    ED Prescriptions    Medication Sig Dispense Auth. Provider   pantoprazole (PROTONIX) 20 MG tablet Take 1 tablet (20 mg total) by mouth daily. 30 tablet Loura Halt A, NP     Controlled Substance Prescriptions [place] Controlled Substance Registry consulted? Not Applicable   Orvan July, NP 03/26/18 1004

## 2018-03-26 NOTE — Discharge Instructions (Addendum)
I with your symptoms are related to GERD and gastritis We will change your medication from the dexlansoprazole to pantoprazole daily It appears that you have an appointment on 10 March with your GI specialist for another endoscopy and colonoscopy I would keep taking the new medication until then and follow-up for further evaluation management per the recommendations

## 2018-04-07 ENCOUNTER — Inpatient Hospital Stay (HOSPITAL_COMMUNITY): Admission: RE | Admit: 2018-04-07 | Payer: Medicare Other | Source: Ambulatory Visit

## 2018-04-17 ENCOUNTER — Other Ambulatory Visit (HOSPITAL_COMMUNITY): Payer: Self-pay | Admitting: *Deleted

## 2018-04-20 ENCOUNTER — Ambulatory Visit (HOSPITAL_COMMUNITY)
Admission: RE | Admit: 2018-04-20 | Discharge: 2018-04-20 | Disposition: A | Discharge status: Home / Self Care | Payer: Medicare Other | Source: Ambulatory Visit | Attending: Internal Medicine | Admitting: Internal Medicine

## 2018-04-20 ENCOUNTER — Other Ambulatory Visit: Payer: Self-pay

## 2018-04-20 DIAGNOSIS — G6181 Chronic inflammatory demyelinating polyneuritis: Secondary | ICD-10-CM | POA: Diagnosis not present

## 2018-04-20 DIAGNOSIS — M329 Systemic lupus erythematosus, unspecified: Secondary | ICD-10-CM | POA: Diagnosis not present

## 2018-04-20 DIAGNOSIS — G609 Hereditary and idiopathic neuropathy, unspecified: Secondary | ICD-10-CM | POA: Diagnosis not present

## 2018-04-20 MED ORDER — DIPHENHYDRAMINE HCL 25 MG PO CAPS
ORAL_CAPSULE | ORAL | Status: AC
  Administered 2018-04-20: 25 mg via ORAL
  Filled 2018-04-20: qty 1

## 2018-04-20 MED ORDER — ACETAMINOPHEN 325 MG PO TABS
ORAL_TABLET | ORAL | Status: AC
  Administered 2018-04-20: 650 mg via ORAL
  Filled 2018-04-20: qty 2

## 2018-04-20 MED ORDER — ACETAMINOPHEN 325 MG PO TABS
650.0000 mg | ORAL_TABLET | Freq: Once | ORAL | Status: DC
  Administered 2018-04-20: 650 mg via ORAL

## 2018-04-20 MED ORDER — IMMUNE GLOBULIN (HUMAN) 20 GM/200ML IV SOLN
70.0000 g | Freq: Once | INTRAVENOUS | Status: DC
  Administered 2018-04-20: 70 g via INTRAVENOUS
  Filled 2018-04-20: qty 100

## 2018-04-20 MED ORDER — DIPHENHYDRAMINE HCL 25 MG PO CAPS
25.0000 mg | ORAL_CAPSULE | Freq: Once | ORAL | Status: DC
  Administered 2018-04-20: 25 mg via ORAL

## 2018-05-30 ENCOUNTER — Emergency Department (HOSPITAL_COMMUNITY): Payer: Medicare Other | Admitting: Certified Registered"

## 2018-05-30 ENCOUNTER — Inpatient Hospital Stay (HOSPITAL_COMMUNITY): Payer: Medicare Other

## 2018-05-30 ENCOUNTER — Emergency Department (HOSPITAL_COMMUNITY): Payer: Medicare Other

## 2018-05-30 ENCOUNTER — Encounter (HOSPITAL_COMMUNITY)
Admission: EM | Disposition: A | Discharge status: Home / Self Care | Payer: Self-pay | Source: Home / Self Care | Attending: Cardiovascular Disease

## 2018-05-30 ENCOUNTER — Other Ambulatory Visit: Payer: Self-pay

## 2018-05-30 ENCOUNTER — Inpatient Hospital Stay (HOSPITAL_COMMUNITY)
Admission: EM | Admit: 2018-05-30 | Discharge: 2018-06-03 | DRG: 246 | Disposition: A | Discharge status: Home / Self Care | Payer: Medicare Other | Attending: Cardiovascular Disease | Admitting: Cardiovascular Disease

## 2018-05-30 ENCOUNTER — Encounter (HOSPITAL_COMMUNITY): Payer: Self-pay | Admitting: Emergency Medicine

## 2018-05-30 DIAGNOSIS — Z978 Presence of other specified devices: Secondary | ICD-10-CM

## 2018-05-30 DIAGNOSIS — R7989 Other specified abnormal findings of blood chemistry: Secondary | ICD-10-CM | POA: Diagnosis not present

## 2018-05-30 DIAGNOSIS — M549 Dorsalgia, unspecified: Secondary | ICD-10-CM | POA: Diagnosis not present

## 2018-05-30 DIAGNOSIS — Z720 Tobacco use: Secondary | ICD-10-CM | POA: Diagnosis not present

## 2018-05-30 DIAGNOSIS — Z20828 Contact with and (suspected) exposure to other viral communicable diseases: Secondary | ICD-10-CM | POA: Diagnosis present

## 2018-05-30 DIAGNOSIS — R001 Bradycardia, unspecified: Secondary | ICD-10-CM | POA: Diagnosis present

## 2018-05-30 DIAGNOSIS — Z888 Allergy status to other drugs, medicaments and biological substances status: Secondary | ICD-10-CM | POA: Diagnosis not present

## 2018-05-30 DIAGNOSIS — J9600 Acute respiratory failure, unspecified whether with hypoxia or hypercapnia: Secondary | ICD-10-CM

## 2018-05-30 DIAGNOSIS — D649 Anemia, unspecified: Secondary | ICD-10-CM | POA: Diagnosis not present

## 2018-05-30 DIAGNOSIS — N179 Acute kidney failure, unspecified: Secondary | ICD-10-CM | POA: Diagnosis present

## 2018-05-30 DIAGNOSIS — Z03818 Encounter for observation for suspected exposure to other biological agents ruled out: Secondary | ICD-10-CM | POA: Diagnosis not present

## 2018-05-30 DIAGNOSIS — I441 Atrioventricular block, second degree: Secondary | ICD-10-CM | POA: Diagnosis present

## 2018-05-30 DIAGNOSIS — I2510 Atherosclerotic heart disease of native coronary artery without angina pectoris: Secondary | ICD-10-CM

## 2018-05-30 DIAGNOSIS — Z8249 Family history of ischemic heart disease and other diseases of the circulatory system: Secondary | ICD-10-CM | POA: Diagnosis not present

## 2018-05-30 DIAGNOSIS — R5082 Postprocedural fever: Secondary | ICD-10-CM | POA: Diagnosis not present

## 2018-05-30 DIAGNOSIS — R0689 Other abnormalities of breathing: Secondary | ICD-10-CM | POA: Diagnosis not present

## 2018-05-30 DIAGNOSIS — J9601 Acute respiratory failure with hypoxia: Secondary | ICD-10-CM

## 2018-05-30 DIAGNOSIS — E876 Hypokalemia: Secondary | ICD-10-CM | POA: Diagnosis not present

## 2018-05-30 DIAGNOSIS — M329 Systemic lupus erythematosus, unspecified: Secondary | ICD-10-CM | POA: Diagnosis present

## 2018-05-30 DIAGNOSIS — R570 Cardiogenic shock: Secondary | ICD-10-CM

## 2018-05-30 DIAGNOSIS — E785 Hyperlipidemia, unspecified: Secondary | ICD-10-CM | POA: Diagnosis not present

## 2018-05-30 DIAGNOSIS — J95821 Acute postprocedural respiratory failure: Secondary | ICD-10-CM | POA: Diagnosis not present

## 2018-05-30 DIAGNOSIS — F17200 Nicotine dependence, unspecified, uncomplicated: Secondary | ICD-10-CM | POA: Diagnosis present

## 2018-05-30 DIAGNOSIS — I214 Non-ST elevation (NSTEMI) myocardial infarction: Secondary | ICD-10-CM | POA: Diagnosis not present

## 2018-05-30 DIAGNOSIS — Z7982 Long term (current) use of aspirin: Secondary | ICD-10-CM | POA: Diagnosis not present

## 2018-05-30 DIAGNOSIS — Z79899 Other long term (current) drug therapy: Secondary | ICD-10-CM | POA: Diagnosis not present

## 2018-05-30 DIAGNOSIS — Z4659 Encounter for fitting and adjustment of other gastrointestinal appliance and device: Secondary | ICD-10-CM | POA: Diagnosis not present

## 2018-05-30 DIAGNOSIS — F17210 Nicotine dependence, cigarettes, uncomplicated: Secondary | ICD-10-CM | POA: Diagnosis not present

## 2018-05-30 DIAGNOSIS — I2111 ST elevation (STEMI) myocardial infarction involving right coronary artery: Secondary | ICD-10-CM

## 2018-05-30 DIAGNOSIS — N19 Unspecified kidney failure: Secondary | ICD-10-CM | POA: Diagnosis not present

## 2018-05-30 DIAGNOSIS — I340 Nonrheumatic mitral (valve) insufficiency: Secondary | ICD-10-CM | POA: Diagnosis not present

## 2018-05-30 DIAGNOSIS — N183 Chronic kidney disease, stage 3 (moderate): Secondary | ICD-10-CM | POA: Diagnosis not present

## 2018-05-30 DIAGNOSIS — I10 Essential (primary) hypertension: Secondary | ICD-10-CM | POA: Diagnosis not present

## 2018-05-30 DIAGNOSIS — Z955 Presence of coronary angioplasty implant and graft: Secondary | ICD-10-CM

## 2018-05-30 DIAGNOSIS — I7101 Dissection of thoracic aorta: Secondary | ICD-10-CM | POA: Diagnosis not present

## 2018-05-30 DIAGNOSIS — J9811 Atelectasis: Secondary | ICD-10-CM | POA: Diagnosis not present

## 2018-05-30 DIAGNOSIS — I213 ST elevation (STEMI) myocardial infarction of unspecified site: Secondary | ICD-10-CM | POA: Diagnosis not present

## 2018-05-30 DIAGNOSIS — I2119 ST elevation (STEMI) myocardial infarction involving other coronary artery of inferior wall: Secondary | ICD-10-CM | POA: Diagnosis present

## 2018-05-30 DIAGNOSIS — R079 Chest pain, unspecified: Secondary | ICD-10-CM | POA: Diagnosis not present

## 2018-05-30 DIAGNOSIS — I219 Acute myocardial infarction, unspecified: Secondary | ICD-10-CM | POA: Diagnosis not present

## 2018-05-30 HISTORY — DX: Hyperlipidemia, unspecified: E78.5

## 2018-05-30 HISTORY — PX: CORONARY/GRAFT ACUTE MI REVASCULARIZATION: CATH118305

## 2018-05-30 HISTORY — DX: ST elevation (STEMI) myocardial infarction of unspecified site: I21.3

## 2018-05-30 HISTORY — PX: LEFT HEART CATH AND CORONARY ANGIOGRAPHY: CATH118249

## 2018-05-30 HISTORY — PX: INTRAOPERATIVE TRANSESOPHAGEAL ECHOCARDIOGRAM: SHX5062

## 2018-05-30 LAB — ABO/RH: ABO/RH(D): A POS

## 2018-05-30 LAB — CBC
HCT: 36.8 % (ref 36.0–46.0)
Hemoglobin: 11.4 g/dL — ABNORMAL LOW (ref 12.0–15.0)
MCH: 28.5 pg (ref 26.0–34.0)
MCHC: 31 g/dL (ref 30.0–36.0)
MCV: 92 fL (ref 80.0–100.0)
Platelets: 158 10*3/uL (ref 150–400)
RBC: 4 MIL/uL (ref 3.87–5.11)
RDW: 13.6 % (ref 11.5–15.5)
WBC: 6.1 10*3/uL (ref 4.0–10.5)
nRBC: 0 % (ref 0.0–0.2)

## 2018-05-30 LAB — BASIC METABOLIC PANEL
Anion gap: 16 — ABNORMAL HIGH (ref 5–15)
BUN: 21 mg/dL — ABNORMAL HIGH (ref 6–20)
CO2: 16 mmol/L — ABNORMAL LOW (ref 22–32)
Calcium: 8.6 mg/dL — ABNORMAL LOW (ref 8.9–10.3)
Chloride: 100 mmol/L (ref 98–111)
Creatinine, Ser: 1.48 mg/dL — ABNORMAL HIGH (ref 0.44–1.00)
GFR calc Af Amer: 45 mL/min — ABNORMAL LOW (ref >60)
GFR calc non Af Amer: 39 mL/min — ABNORMAL LOW (ref >60)
Glucose, Bld: 112 mg/dL — ABNORMAL HIGH (ref 70–99)
Potassium: 3.5 mmol/L (ref 3.5–5.1)
Sodium: 132 mmol/L — ABNORMAL LOW (ref 135–145)

## 2018-05-30 LAB — POCT I-STAT 7, (LYTES, BLD GAS, ICA,H+H)
Acid-base deficit: 5 mmol/L — ABNORMAL HIGH (ref 0.0–2.0)
Bicarbonate: 19.9 mmol/L — ABNORMAL LOW (ref 20.0–28.0)
Calcium, Ion: 1.15 mmol/L (ref 1.15–1.40)
HCT: 31 % — ABNORMAL LOW (ref 36.0–46.0)
Hemoglobin: 10.5 g/dL — ABNORMAL LOW (ref 12.0–15.0)
O2 Saturation: 100 %
Patient temperature: 36.2
Potassium: 3.7 mmol/L (ref 3.5–5.1)
Sodium: 134 mmol/L — ABNORMAL LOW (ref 135–145)
TCO2: 21 mmol/L — ABNORMAL LOW (ref 22–32)
pCO2 arterial: 35.9 mmHg (ref 32.0–48.0)
pH, Arterial: 7.347 — ABNORMAL LOW (ref 7.350–7.450)
pO2, Arterial: 369 mmHg — ABNORMAL HIGH (ref 83.0–108.0)

## 2018-05-30 LAB — HEPATIC FUNCTION PANEL
ALT: 76 U/L — ABNORMAL HIGH (ref 0–44)
AST: 425 U/L — ABNORMAL HIGH (ref 15–41)
Albumin: 3.2 g/dL — ABNORMAL LOW (ref 3.5–5.0)
Alkaline Phosphatase: 90 U/L (ref 38–126)
Bilirubin, Direct: 0.3 mg/dL — ABNORMAL HIGH (ref 0.0–0.2)
Indirect Bilirubin: 0.4 mg/dL (ref 0.3–0.9)
Total Bilirubin: 0.7 mg/dL (ref 0.3–1.2)
Total Protein: 6.5 g/dL (ref 6.5–8.1)

## 2018-05-30 LAB — SARS CORONAVIRUS 2 BY RT PCR (HOSPITAL ORDER, PERFORMED IN ~~LOC~~ HOSPITAL LAB): SARS Coronavirus 2: NEGATIVE

## 2018-05-30 LAB — LACTIC ACID, PLASMA: Lactic Acid, Venous: 2 mmol/L (ref 0.5–1.9)

## 2018-05-30 LAB — MRSA PCR SCREENING: MRSA by PCR: NEGATIVE

## 2018-05-30 LAB — ECHO INTRAOPERATIVE TEE
Height: 68 in
Weight: 2560 oz

## 2018-05-30 LAB — LIPID PANEL
Cholesterol: 168 mg/dL (ref 0–200)
HDL: 64 mg/dL (ref >40)
Total CHOL/HDL Ratio: 2.6 RATIO
Triglycerides: 99 mg/dL (ref <150)
VLDL: 20 mg/dL (ref 0–40)

## 2018-05-30 LAB — TROPONIN I
Troponin I: >65 ng/mL (ref <0.03)
Troponin I: >65 ng/mL (ref <0.03)

## 2018-05-30 LAB — I-STAT BETA HCG BLOOD, ED (MC, WL, AP ONLY): I-stat hCG, quantitative: <5 m[IU]/mL (ref <5)

## 2018-05-30 LAB — MAGNESIUM: Magnesium: 2 mg/dL (ref 1.7–2.4)

## 2018-05-30 LAB — APTT: aPTT: 30 seconds (ref 24–36)

## 2018-05-30 LAB — PREPARE RBC (CROSSMATCH)

## 2018-05-30 LAB — LIPASE, BLOOD: Lipase: 27 U/L (ref 11–51)

## 2018-05-30 LAB — PROTIME-INR
INR: 1.1 (ref 0.8–1.2)
Prothrombin Time: 13.7 seconds (ref 11.4–15.2)

## 2018-05-30 LAB — GLUCOSE, CAPILLARY: Glucose-Capillary: 116 mg/dL — ABNORMAL HIGH (ref 70–99)

## 2018-05-30 SURGERY — ECHOCARDIOGRAM, TRANSESOPHAGEAL, INTRAOPERATIVE
Anesthesia: General | Site: Esophagus

## 2018-05-30 SURGERY — CORONARY/GRAFT ACUTE MI REVASCULARIZATION
Anesthesia: LOCAL

## 2018-05-30 MED ORDER — LIDOCAINE HCL (PF) 1 % IJ SOLN
INTRAMUSCULAR | Status: AC
  Filled 2018-05-30: qty 30

## 2018-05-30 MED ORDER — SODIUM CHLORIDE 0.9% FLUSH: 3.0000 mL | INTRAVENOUS | Status: DC | PRN

## 2018-05-30 MED ORDER — FENTANYL CITRATE (PF) 100 MCG/2ML IJ SOLN
50.0000 ug | Freq: Once | INTRAMUSCULAR | Status: AC
  Administered 2018-05-30: 17:00:00 50 ug via INTRAVENOUS
  Filled 2018-05-30: qty 2

## 2018-05-30 MED ORDER — ONDANSETRON HCL 4 MG/2ML IJ SOLN
4.0000 mg | Freq: Four times a day (QID) | INTRAMUSCULAR | Status: DC | PRN
  Administered 2018-06-02: 4 mg via INTRAVENOUS
  Filled 2018-05-30: qty 2

## 2018-05-30 MED ORDER — HEPARIN (PORCINE) IN NACL 1000-0.9 UT/500ML-% IV SOLN
INTRAVENOUS | Status: DC | PRN
  Administered 2018-05-30 (×2): 500 mL

## 2018-05-30 MED ORDER — SODIUM CHLORIDE 0.9 % IV SOLN
1.5000 g | INTRAVENOUS | Status: DC
  Filled 2018-05-30 (×2): qty 1.5

## 2018-05-30 MED ORDER — DEXMEDETOMIDINE HCL IN NACL 400 MCG/100ML IV SOLN
0.1000 ug/kg/h | INTRAVENOUS | Status: DC
  Administered 2018-05-30: 19:00:00 1 ug/kg/h via INTRAVENOUS
  Administered 2018-05-31: 0.7 ug/kg/h via INTRAVENOUS
  Filled 2018-05-30 (×2): qty 100

## 2018-05-30 MED ORDER — CHLORHEXIDINE GLUCONATE CLOTH 2 % EX PADS: 6.0000 | MEDICATED_PAD | Freq: Once | CUTANEOUS | Status: DC

## 2018-05-30 MED ORDER — ASPIRIN 81 MG PO CHEW
324.0000 mg | CHEWABLE_TABLET | Freq: Once | ORAL | Status: AC
  Administered 2018-05-30: 10:00:00 324 mg via ORAL
  Filled 2018-05-30: qty 4

## 2018-05-30 MED ORDER — SODIUM CHLORIDE (PF) 0.9 % IJ SOLN: OROMUCOSAL | Status: DC | PRN

## 2018-05-30 MED ORDER — NOREPINEPHRINE 4 MG/250ML-% IV SOLN
0.0000 ug/min | INTRAVENOUS | Status: DC
  Filled 2018-05-30 (×2): qty 250

## 2018-05-30 MED ORDER — PANTOPRAZOLE SODIUM 40 MG IV SOLR
40.0000 mg | Freq: Every day | INTRAVENOUS | Status: DC
  Administered 2018-05-30: 21:00:00 40 mg via INTRAVENOUS
  Filled 2018-05-30: qty 40

## 2018-05-30 MED ORDER — KENNESTONE BLOOD CARDIOPLEGIA VIAL
13.0000 mL | Status: DC
  Filled 2018-05-30: qty 13

## 2018-05-30 MED ORDER — POTASSIUM CHLORIDE 2 MEQ/ML IV SOLN
80.0000 meq | INTRAVENOUS | Status: DC
  Filled 2018-05-30 (×2): qty 40

## 2018-05-30 MED ORDER — MIDAZOLAM HCL (PF) 5 MG/ML IJ SOLN
INTRAMUSCULAR | Status: DC | PRN
  Administered 2018-05-30: 7 mg via INTRAVENOUS
  Administered 2018-05-30: 3 mg via INTRAVENOUS

## 2018-05-30 MED ORDER — FENTANYL CITRATE (PF) 250 MCG/5ML IJ SOLN
INTRAMUSCULAR | Status: DC | PRN
  Administered 2018-05-30: 100 ug via INTRAVENOUS
  Administered 2018-05-30: 150 ug via INTRAVENOUS
  Administered 2018-05-30: 100 ug via INTRAVENOUS
  Administered 2018-05-30: 250 ug via INTRAVENOUS

## 2018-05-30 MED ORDER — TRANEXAMIC ACID (OHS) PUMP PRIME SOLUTION
2.0000 mg/kg | INTRAVENOUS | Status: DC
  Filled 2018-05-30 (×2): qty 1.45

## 2018-05-30 MED ORDER — MAGNESIUM SULFATE 50 % IJ SOLN
40.0000 meq | INTRAMUSCULAR | Status: DC
  Filled 2018-05-30: qty 9.85

## 2018-05-30 MED ORDER — IOHEXOL 350 MG/ML SOLN
INTRAVENOUS | Status: DC | PRN
  Administered 2018-05-30: 16:00:00 160 mL via INTRAVENOUS

## 2018-05-30 MED ORDER — PLASMA-LYTE 148 IV SOLN
INTRAVENOUS | Status: DC
  Filled 2018-05-30 (×2): qty 2.5

## 2018-05-30 MED ORDER — SODIUM CHLORIDE 0.9 % IV SOLN
250.0000 mL | INTRAVENOUS | Status: DC | PRN
  Administered 2018-05-31: 1000 mL via INTRAVENOUS
  Administered 2018-06-01: 07:00:00 via INTRAVENOUS

## 2018-05-30 MED ORDER — TIROFIBAN HCL IN NACL 5-0.9 MG/100ML-% IV SOLN
0.1500 ug/kg/min | INTRAVENOUS | Status: DC
  Administered 2018-05-30: 18:00:00 0.075 ug/kg/min via INTRAVENOUS
  Administered 2018-05-31 (×2): 0.15 ug/kg/min via INTRAVENOUS
  Administered 2018-05-31: 06:00:00 0.075 ug/kg/min via INTRAVENOUS
  Administered 2018-06-01: 07:00:00 0.15 ug/kg/min via INTRAVENOUS
  Filled 2018-05-30 (×5): qty 100

## 2018-05-30 MED ORDER — NOREPINEPHRINE 4 MG/250ML-% IV SOLN
0.0000 ug/min | INTRAVENOUS | Status: DC
  Administered 2018-05-30 (×2): 2 ug/min via INTRAVENOUS
  Administered 2018-05-31: 20:00:00 8 ug/min via INTRAVENOUS
  Administered 2018-05-31: 10:00:00 6 ug/min via INTRAVENOUS
  Administered 2018-06-01: 04:00:00 8 ug/min via INTRAVENOUS
  Filled 2018-05-30 (×3): qty 250

## 2018-05-30 MED ORDER — DOPAMINE-DEXTROSE 3.2-5 MG/ML-% IV SOLN
0.0000 ug/kg/min | INTRAVENOUS | Status: DC
  Filled 2018-05-30 (×2): qty 250

## 2018-05-30 MED ORDER — NITROGLYCERIN 1 MG/10 ML FOR IR/CATH LAB
INTRA_ARTERIAL | Status: AC
  Filled 2018-05-30: qty 10

## 2018-05-30 MED ORDER — FENTANYL CITRATE (PF) 100 MCG/2ML IJ SOLN
50.0000 ug | Freq: Once | INTRAMUSCULAR | Status: AC
  Administered 2018-05-30: 50 ug via INTRAVENOUS
  Filled 2018-05-30: qty 2

## 2018-05-30 MED ORDER — ARTIFICIAL TEARS OPHTHALMIC OINT
TOPICAL_OINTMENT | OPHTHALMIC | Status: AC
  Filled 2018-05-30: qty 3.5

## 2018-05-30 MED ORDER — TRANEXAMIC ACID 1000 MG/10ML IV SOLN
1.5000 mg/kg/h | INTRAVENOUS | Status: DC
  Filled 2018-05-30 (×2): qty 25

## 2018-05-30 MED ORDER — IOHEXOL 350 MG/ML SOLN
100.0000 mL | Freq: Once | INTRAVENOUS | Status: AC | PRN
  Administered 2018-05-30: 11:00:00 100 mL via INTRAVENOUS

## 2018-05-30 MED ORDER — CHLORHEXIDINE GLUCONATE 0.12 % MT SOLN: 15.0000 mL | Freq: Once | OROMUCOSAL | Status: DC

## 2018-05-30 MED ORDER — LIDOCAINE HCL (PF) 1 % IJ SOLN
INTRAMUSCULAR | Status: DC | PRN
  Administered 2018-05-30: 30 mL via INTRADERMAL

## 2018-05-30 MED ORDER — ONDANSETRON HCL 4 MG/2ML IJ SOLN
4.0000 mg | Freq: Once | INTRAMUSCULAR | Status: AC
  Administered 2018-05-30: 11:00:00 4 mg via INTRAVENOUS
  Filled 2018-05-30: qty 2

## 2018-05-30 MED ORDER — LABETALOL HCL 5 MG/ML IV SOLN: 10.0000 mg | INTRAVENOUS | Status: AC | PRN

## 2018-05-30 MED ORDER — LACTATED RINGERS IV SOLN
INTRAVENOUS | Status: DC | PRN
  Administered 2018-05-30: 13:00:00 via INTRAVENOUS

## 2018-05-30 MED ORDER — KENNESTONE BLOOD CARDIOPLEGIA (KBC) MANNITOL SYRINGE (20%, 32ML)
32.0000 mL | INTRAVENOUS | Status: DC
  Filled 2018-05-30 (×3): qty 32

## 2018-05-30 MED ORDER — ETOMIDATE 2 MG/ML IV SOLN
INTRAVENOUS | Status: DC | PRN
  Administered 2018-05-30: 20 mg via INTRAVENOUS

## 2018-05-30 MED ORDER — TEMAZEPAM 15 MG PO CAPS: 15.0000 mg | ORAL_CAPSULE | Freq: Once | ORAL | Status: DC | PRN

## 2018-05-30 MED ORDER — ORAL CARE MOUTH RINSE
15.0000 mL | OROMUCOSAL | Status: DC
  Administered 2018-05-30 – 2018-06-01 (×16): 15 mL via OROMUCOSAL

## 2018-05-30 MED ORDER — MIDAZOLAM HCL (PF) 10 MG/2ML IJ SOLN
INTRAMUSCULAR | Status: AC
  Filled 2018-05-30: qty 2

## 2018-05-30 MED ORDER — SODIUM CHLORIDE 0.9 % IV SOLN
750.0000 mg | INTRAVENOUS | Status: DC
  Filled 2018-05-30 (×2): qty 750

## 2018-05-30 MED ORDER — TRANEXAMIC ACID (OHS) BOLUS VIA INFUSION
15.0000 mg/kg | INTRAVENOUS | Status: DC
  Filled 2018-05-30 (×2): qty 1089

## 2018-05-30 MED ORDER — ASPIRIN 81 MG PO CHEW: 81.0000 mg | CHEWABLE_TABLET | Freq: Every day | ORAL | Status: DC

## 2018-05-30 MED ORDER — 0.9 % SODIUM CHLORIDE (POUR BTL) OPTIME: TOPICAL | Status: DC | PRN

## 2018-05-30 MED ORDER — HEPARIN (PORCINE) IN NACL 1000-0.9 UT/500ML-% IV SOLN
INTRAVENOUS | Status: AC
  Filled 2018-05-30: qty 1000

## 2018-05-30 MED ORDER — SUCCINYLCHOLINE CHLORIDE 200 MG/10ML IV SOSY
PREFILLED_SYRINGE | INTRAVENOUS | Status: DC | PRN
  Administered 2018-05-30: 170 mg via INTRAVENOUS

## 2018-05-30 MED ORDER — FENTANYL CITRATE (PF) 100 MCG/2ML IJ SOLN
50.0000 ug | INTRAMUSCULAR | Status: DC | PRN
  Filled 2018-05-30: qty 2

## 2018-05-30 MED ORDER — TICAGRELOR 90 MG PO TABS
90.0000 mg | ORAL_TABLET | Freq: Two times a day (BID) | ORAL | Status: DC
  Administered 2018-05-31: 90 mg via ORAL
  Filled 2018-05-30: qty 1

## 2018-05-30 MED ORDER — CANGRELOR BOLUS VIA INFUSION
INTRAVENOUS | Status: DC | PRN
  Administered 2018-05-30: 15:00:00 2178 ug via INTRAVENOUS

## 2018-05-30 MED ORDER — SODIUM CHLORIDE 0.9 % IV SOLN
INTRAVENOUS | Status: DC
  Filled 2018-05-30 (×2): qty 30

## 2018-05-30 MED ORDER — HEPARIN SODIUM (PORCINE) 1000 UNIT/ML IJ SOLN
INTRAMUSCULAR | Status: DC | PRN
  Administered 2018-05-30: 10000 [IU] via INTRAVENOUS

## 2018-05-30 MED ORDER — DEXMEDETOMIDINE HCL IN NACL 400 MCG/100ML IV SOLN
0.1000 ug/kg/h | INTRAVENOUS | Status: AC
  Administered 2018-05-30: 0.7 ug/kg/h via INTRAVENOUS
  Filled 2018-05-30 (×2): qty 100

## 2018-05-30 MED ORDER — EPINEPHRINE PF 1 MG/ML IJ SOLN
0.0000 ug/min | INTRAVENOUS | Status: DC
  Filled 2018-05-30 (×2): qty 4

## 2018-05-30 MED ORDER — SODIUM CHLORIDE 0.9% FLUSH
3.0000 mL | Freq: Two times a day (BID) | INTRAVENOUS | Status: DC
  Administered 2018-05-30 – 2018-06-01 (×5): 3 mL via INTRAVENOUS

## 2018-05-30 MED ORDER — HYDRALAZINE HCL 20 MG/ML IJ SOLN: 10.0000 mg | INTRAMUSCULAR | Status: AC | PRN

## 2018-05-30 MED ORDER — BISACODYL 5 MG PO TBEC: 5.0000 mg | DELAYED_RELEASE_TABLET | Freq: Once | ORAL | Status: DC

## 2018-05-30 MED ORDER — PROTAMINE SULFATE 10 MG/ML IV SOLN
40.0000 mg | INTRAVENOUS | Status: AC
  Administered 2018-05-30: 12:00:00 40 mg via INTRAVENOUS
  Filled 2018-05-30: qty 4

## 2018-05-30 MED ORDER — PHENYLEPHRINE HCL-NACL 20-0.9 MG/250ML-% IV SOLN
30.0000 ug/min | INTRAVENOUS | Status: DC
  Filled 2018-05-30 (×2): qty 250

## 2018-05-30 MED ORDER — CANGRELOR TETRASODIUM 50 MG IV SOLR
INTRAVENOUS | Status: AC
  Filled 2018-05-30: qty 50

## 2018-05-30 MED ORDER — HEPARIN SODIUM (PORCINE) 1000 UNIT/ML IJ SOLN
INTRAMUSCULAR | Status: AC
  Filled 2018-05-30: qty 1

## 2018-05-30 MED ORDER — ATORVASTATIN CALCIUM 80 MG PO TABS
80.0000 mg | ORAL_TABLET | Freq: Every day | ORAL | Status: DC
  Administered 2018-05-30: 18:00:00 80 mg via ORAL
  Filled 2018-05-30: qty 1

## 2018-05-30 MED ORDER — NITROGLYCERIN IN D5W 200-5 MCG/ML-% IV SOLN
2.0000 ug/min | INTRAVENOUS | Status: DC
  Filled 2018-05-30 (×2): qty 250

## 2018-05-30 MED ORDER — NITROGLYCERIN 1 MG/10 ML FOR IR/CATH LAB
INTRA_ARTERIAL | Status: DC | PRN
  Administered 2018-05-30: 200 ug via INTRACORONARY

## 2018-05-30 MED ORDER — MILRINONE LACTATE IN DEXTROSE 20-5 MG/100ML-% IV SOLN
0.3000 ug/kg/min | INTRAVENOUS | Status: DC
  Filled 2018-05-30 (×2): qty 100

## 2018-05-30 MED ORDER — FENTANYL CITRATE (PF) 250 MCG/5ML IJ SOLN
INTRAMUSCULAR | Status: AC
  Filled 2018-05-30: qty 30

## 2018-05-30 MED ORDER — INSULIN REGULAR(HUMAN) IN NACL 100-0.9 UT/100ML-% IV SOLN
INTRAVENOUS | Status: DC
  Filled 2018-05-30 (×2): qty 100

## 2018-05-30 MED ORDER — SODIUM CHLORIDE 0.9 % IV SOLN
INTRAVENOUS | Status: DC
  Administered 2018-05-30: 18:00:00 via INTRAVENOUS

## 2018-05-30 MED ORDER — METOPROLOL TARTRATE 12.5 MG HALF TABLET: 12.5000 mg | ORAL_TABLET | Freq: Once | ORAL | Status: DC

## 2018-05-30 MED ORDER — KENNESTONE BLOOD CARDIOPLEGIA VIAL
13.0000 mL | Status: DC
  Filled 2018-05-30 (×3): qty 13

## 2018-05-30 MED ORDER — ROCURONIUM BROMIDE 10 MG/ML (PF) SYRINGE
PREFILLED_SYRINGE | INTRAVENOUS | Status: DC | PRN
  Administered 2018-05-30: 100 mg via INTRAVENOUS

## 2018-05-30 MED ORDER — SODIUM CHLORIDE 0.9 % IV SOLN
INTRAVENOUS | Status: AC | PRN
  Administered 2018-05-30: 15:00:00 4 ug/kg/min via INTRAVENOUS

## 2018-05-30 MED ORDER — CHLORHEXIDINE GLUCONATE 0.12% ORAL RINSE (MEDLINE KIT)
15.0000 mL | Freq: Two times a day (BID) | OROMUCOSAL | Status: DC
  Administered 2018-05-30 – 2018-06-01 (×4): 15 mL via OROMUCOSAL

## 2018-05-30 MED ORDER — DEXMEDETOMIDINE HCL IN NACL 400 MCG/100ML IV SOLN: 0.1000 ug/kg/h | INTRAVENOUS | Status: DC

## 2018-05-30 MED ORDER — ACETAMINOPHEN 325 MG PO TABS: 650.0000 mg | ORAL_TABLET | ORAL | Status: DC | PRN

## 2018-05-30 MED ORDER — SODIUM CHLORIDE 0.9 % IV BOLUS
1000.0000 mL | Freq: Once | INTRAVENOUS | Status: AC
  Administered 2018-05-30: 10:00:00 1000 mL via INTRAVENOUS

## 2018-05-30 MED ORDER — FENTANYL CITRATE (PF) 100 MCG/2ML IJ SOLN
50.0000 ug | INTRAMUSCULAR | Status: DC | PRN
  Administered 2018-05-30 – 2018-05-31 (×6): 100 ug via INTRAVENOUS
  Administered 2018-05-31: 14:00:00 200 ug via INTRAVENOUS
  Filled 2018-05-30 (×2): qty 2
  Filled 2018-05-30: qty 4
  Filled 2018-05-30 (×3): qty 2

## 2018-05-30 MED ORDER — VANCOMYCIN HCL 10 G IV SOLR
1250.0000 mg | INTRAVENOUS | Status: DC
  Filled 2018-05-30 (×2): qty 1250

## 2018-05-30 MED ORDER — HEPARIN SODIUM (PORCINE) 5000 UNIT/ML IJ SOLN
4000.0000 [IU] | Freq: Once | INTRAMUSCULAR | Status: AC
  Administered 2018-05-30: 4000 [IU] via INTRAVENOUS
  Filled 2018-05-30: qty 1

## 2018-05-30 MED ORDER — TICAGRELOR 90 MG PO TABS
180.0000 mg | ORAL_TABLET | Freq: Once | ORAL | Status: AC
  Administered 2018-05-30: 18:00:00 180 mg via ORAL
  Filled 2018-05-30: qty 2

## 2018-05-30 MED ORDER — SODIUM CHLORIDE 0.9 % IV SOLN
INTRAVENOUS | Status: DC
  Administered 2018-05-30: 10:00:00 via INTRAVENOUS

## 2018-05-30 MED ORDER — HEMOSTATIC AGENTS (NO CHARGE) OPTIME: TOPICAL | Status: DC | PRN

## 2018-05-30 SURGICAL SUPPLY — 101 items
ADAPTER CARDIO PERF ANTE/RETRO (ADAPTER) ×1 IMPLANT
ADH SRG 12 PREFL SYR 3 SPRDR (MISCELLANEOUS)
ADPR PRFSN 84XANTGRD RTRGD (ADAPTER)
BAG DECANTER FOR FLEXI CONT (MISCELLANEOUS) ×1 IMPLANT
BLADE CLIPPER SURG (BLADE) IMPLANT
BLADE STERNUM SYSTEM 6 (BLADE) ×1 IMPLANT
BLADE SURG 15 STRL LF DISP TIS (BLADE) ×1 IMPLANT
BLADE SURG 15 STRL SS (BLADE)
CANISTER SUCT 3000ML PPV (MISCELLANEOUS) ×1 IMPLANT
CANN PRFSN .5XCNCT 15X34-48 (MISCELLANEOUS)
CANNULA ARTERIAL 007325 (MISCELLANEOUS) IMPLANT
CANNULA ARTERIAL 14F 007324 (MISCELLANEOUS) IMPLANT
CANNULA PRFSN .5XCNCT 15X34-48 (MISCELLANEOUS) IMPLANT
CANNULA VEN 2 STAGE (MISCELLANEOUS)
CATH CPB KIT GERHARDT (MISCELLANEOUS) ×1 IMPLANT
CATH RETROPLEGIA CORONARY 14FR (CATHETERS) ×1 IMPLANT
CATH THORACIC 28FR (CATHETERS) IMPLANT
CLIP VESOCCLUDE MED 6/CT (CLIP) ×1 IMPLANT
CLIP VESOCCLUDE SM WIDE 24/CT (CLIP) IMPLANT
CLIP VESOCCLUDE SM WIDE 6/CT (CLIP) IMPLANT
CONN 1/2X1/2X1/2  BEN (MISCELLANEOUS)
CONN 1/2X1/2X1/2 BEN (MISCELLANEOUS) IMPLANT
CONN 3/8X3/8 GISH STERILE (MISCELLANEOUS) IMPLANT
CONN Y 3/8X3/8X3/8  BEN (MISCELLANEOUS)
CONN Y 3/8X3/8X3/8 BEN (MISCELLANEOUS) IMPLANT
COVER WAND RF STERILE (DRAPES) ×1 IMPLANT
CRADLE DONUT ADULT HEAD (MISCELLANEOUS) IMPLANT
DRAIN CHANNEL 15F RND FF W/TCR (WOUND CARE) IMPLANT
DRAIN CHANNEL 19F RND (DRAIN) IMPLANT
DRAIN CHANNEL 28F RND 3/8 FF (WOUND CARE) ×1 IMPLANT
DRAIN SNY 10X20 3/4 PERF (WOUND CARE) IMPLANT
DRAIN WOUND SNY 15 RND (WOUND CARE) IMPLANT
DRSG AQUACEL AG ADV 3.5X14 (GAUZE/BANDAGES/DRESSINGS) ×1 IMPLANT
ELECT BLADE 4.0 EZ CLEAN MEGAD (MISCELLANEOUS)
ELECT CAUTERY BLADE 6.4 (BLADE) ×1 IMPLANT
ELECT REM PT RETURN 9FT ADLT (ELECTROSURGICAL)
ELECTRODE BLDE 4.0 EZ CLN MEGD (MISCELLANEOUS) ×1 IMPLANT
ELECTRODE REM PT RTRN 9FT ADLT (ELECTROSURGICAL) ×2 IMPLANT
EVACUATOR SILICONE 100CC (DRAIN) IMPLANT
FELT TEFLON 6X6 (MISCELLANEOUS) IMPLANT
GAUZE SPONGE 4X4 12PLY STRL (GAUZE/BANDAGES/DRESSINGS) ×2 IMPLANT
GLOVE BIO SURGEON STRL SZ 6.5 (GLOVE) ×3 IMPLANT
GOWN STRL REUS W/ TWL LRG LVL3 (GOWN DISPOSABLE) ×4 IMPLANT
GOWN STRL REUS W/TWL LRG LVL3 (GOWN DISPOSABLE)
HEMOSTAT POWDER SURGIFOAM 1G (HEMOSTASIS) IMPLANT
HEMOSTAT SURGICEL 2X14 (HEMOSTASIS) IMPLANT
INSERT FOGARTY SM (MISCELLANEOUS) ×1 IMPLANT
KIT BASIN OR (CUSTOM PROCEDURE TRAY) ×1 IMPLANT
KIT SUCTION CATH 14FR (SUCTIONS) IMPLANT
KIT TURNOVER KIT B (KITS) ×1 IMPLANT
LEAD PACING MYOCARDI (MISCELLANEOUS) ×1 IMPLANT
NEEDLE AORTIC AIR ASPIRATING (NEEDLE) IMPLANT
NS IRRIG 1000ML POUR BTL (IV SOLUTION) ×4 IMPLANT
PACK E OPEN HEART (SUTURE) IMPLANT
PACK OPEN HEART (CUSTOM PROCEDURE TRAY) ×1 IMPLANT
PAD ARMBOARD 7.5X6 YLW CONV (MISCELLANEOUS) ×2 IMPLANT
SPONGE LAP 18X18 RF (DISPOSABLE) IMPLANT
SPONGE LAP 4X18 RFD (DISPOSABLE) IMPLANT
STAPLER VISISTAT 35W (STAPLE) IMPLANT
STOPCOCK 4 WAY LG BORE MALE ST (IV SETS) IMPLANT
STRIP CLOSURE SKIN 1/2X4 (GAUZE/BANDAGES/DRESSINGS) IMPLANT
SUT ETHIBOND 2 0 SH (SUTURE)
SUT ETHIBOND 2 0 SH 36X2 (SUTURE) ×4 IMPLANT
SUT MNCRL AB 3-0 PS2 18 (SUTURE) IMPLANT
SUT PROLENE 3 0 RB 1 (SUTURE) ×1 IMPLANT
SUT PROLENE 3 0 SH 1 (SUTURE) ×1 IMPLANT
SUT PROLENE 3 0 SH DA (SUTURE) ×1 IMPLANT
SUT PROLENE 4 0 SH DA (SUTURE) IMPLANT
SUT PROLENE 4 0 TF (SUTURE) ×2 IMPLANT
SUT PROLENE 5 0 C 1 36 (SUTURE) ×2 IMPLANT
SUT PROLENE 6 0 CC (SUTURE) IMPLANT
SUT PROLENE 7 0 BV 1 (SUTURE) IMPLANT
SUT PROLENE 7 0 BV1 MDA (SUTURE) IMPLANT
SUT SILK 1 TIES 10X30 (SUTURE) ×1 IMPLANT
SUT SILK 2 0 SH CR/8 (SUTURE) ×2 IMPLANT
SUT SILK 2 0 TIES 17X18 (SUTURE)
SUT SILK 2-0 18XBRD TIE BLK (SUTURE) ×1 IMPLANT
SUT SILK 3 0 SH CR/8 (SUTURE) ×1 IMPLANT
SUT SILK 4 0 TIES 17X18 (SUTURE) ×1 IMPLANT
SUT STEEL 6MS V (SUTURE) ×1 IMPLANT
SUT STEEL STERNAL CCS#1 18IN (SUTURE) IMPLANT
SUT STEEL SZ 6 DBL 3X14 BALL (SUTURE) IMPLANT
SUT TEM PAC WIRE 2 0 SH (SUTURE) ×2 IMPLANT
SUT VIC AB 1 CT1 18XCR BRD 8 (SUTURE) IMPLANT
SUT VIC AB 1 CT1 8-18 (SUTURE)
SUT VIC AB 1 CTX 18 (SUTURE) ×2 IMPLANT
SUT VIC AB 1 CTX 27 (SUTURE) ×2 IMPLANT
SUT VIC AB 2-0 CT1 27 (SUTURE)
SUT VIC AB 2-0 CT1 TAPERPNT 27 (SUTURE) IMPLANT
SUT VIC AB 2-0 CTX 36 (SUTURE) ×1 IMPLANT
SUT VIC AB 3-0 SH 27 (SUTURE)
SUT VIC AB 3-0 SH 27X BRD (SUTURE) IMPLANT
SUT VIC AB 3-0 X1 27 (SUTURE) IMPLANT
SUT VICRYL 4-0 PS2 18IN ABS (SUTURE) IMPLANT
SYR 10ML KIT SKIN ADHESIVE (MISCELLANEOUS) IMPLANT
SYSTEM SAHARA CHEST DRAIN ATS (WOUND CARE) ×1 IMPLANT
TOWEL GREEN STERILE (TOWEL DISPOSABLE) ×1 IMPLANT
TOWEL GREEN STERILE FF (TOWEL DISPOSABLE) ×1 IMPLANT
TRAY CATH LUMEN 1 20CM STRL (SET/KITS/TRAYS/PACK) IMPLANT
TUBE FEEDING 8FR 16IN STR KANG (MISCELLANEOUS) ×1 IMPLANT
WATER STERILE IRR 1000ML POUR (IV SOLUTION) ×2 IMPLANT

## 2018-05-30 SURGICAL SUPPLY — 18 items
BALLN SAPPHIRE 2.5X12 (BALLOONS) ×2
BALLN SAPPHIRE ~~LOC~~ 3.5X18 (BALLOONS) ×1 IMPLANT
BALLOON SAPPHIRE 2.5X12 (BALLOONS) IMPLANT
CATH EXTRAC PRONTO LP 6F RND (CATHETERS) ×1 IMPLANT
CATH INFINITI MULTIPACK ST 5F (CATHETERS) ×1 IMPLANT
CATH VISTA GUIDE 6FR JR4 (CATHETERS) ×1 IMPLANT
KIT ENCORE 26 ADVANTAGE (KITS) ×1 IMPLANT
KIT HEART LEFT (KITS) ×2 IMPLANT
PACK CARDIAC CATHETERIZATION (CUSTOM PROCEDURE TRAY) ×2 IMPLANT
SHEATH PINNACLE 5F 10CM (SHEATH) ×1 IMPLANT
SHEATH PINNACLE 6F 10CM (SHEATH) ×1 IMPLANT
SHEATH PROBE COVER 6X72 (BAG) ×1 IMPLANT
STENT SYNERGY DES 2.75X20 (Permanent Stent) ×1 IMPLANT
STENT SYNERGY DES 3X24 (Permanent Stent) ×1 IMPLANT
TRANSDUCER W/STOPCOCK (MISCELLANEOUS) ×2 IMPLANT
TUBING CIL FLEX 10 FLL-RA (TUBING) ×2 IMPLANT
WIRE COUGAR XT STRL 190CM (WIRE) ×1 IMPLANT
WIRE EMERALD 3MM-J .035X150CM (WIRE) ×1 IMPLANT

## 2018-05-30 NOTE — ED Notes (Signed)
Cardiology aware of patients critical troponin.

## 2018-05-30 NOTE — Interval H&P Note (Signed)
History and Physical Interval Note:  05/30/2018 2:55 PM  Samantha [name]  has presented today for surgery, with the diagnosis of STEMI.  The various methods of treatment have been discussed with the patient and family. After consideration of risks, benefits and other options for treatment, the patient has consented to  Procedure(s): Coronary/Graft Acute MI Revascularization (N/A) LEFT HEART CATH AND CORONARY ANGIOGRAPHY (N/A) as a surgical intervention.  The patient's history has been reviewed, patient examined, no change in status, stable for surgery.  I have reviewed the patient's chart and labs.  Questions were answered to the patient's satisfaction.    Cath Lab Visit (complete for each Cath Lab visit)  Clinical Evaluation Leading to the Procedure:   ACS: Yes.    Non-ACS:    Anginal Classification: CCS IV  Anti-ischemic medical therapy: No Therapy  Non-Invasive Test Results: No non-invasive testing performed  Prior CABG: No previous CABG         Lauree Chandler

## 2018-05-30 NOTE — Anesthesia Postprocedure Evaluation (Signed)
Anesthesia Post Note  Patient: Samantha [name]  Procedure(s) Performed: REPAIR OF ACUTE ASCENDING THORACIC AORTIC DISSECTION (N/A )     Patient location during evaluation: Cath Lab Anesthesia Type: General Level of consciousness: sedated Pain management: pain level controlled Vital Signs Assessment: post-procedure vital signs reviewed and stable Respiratory status: patient remains intubated per anesthesia plan Cardiovascular status: stable Postop Assessment: no apparent nausea or vomiting Anesthetic complications: no    Last Vitals:  Vitals:   05/30/18 1208 05/30/18 1215  BP: 124/78 115/78  Pulse: 72 69  Resp: (!) 25 (!) 23  Temp:    SpO2: 94% 100%                 Persephanie Laatsch DANIEL

## 2018-05-30 NOTE — ED Triage Notes (Signed)
Pt arrives from home complaining of chest pain that radiates up her body into her shoulders and into her face that started last night. Pt states she feels like she needs to throw up but cannot.

## 2018-05-30 NOTE — Progress Notes (Signed)
  Echocardiogram 2D Echocardiogram and Echocardiogram Transesophageal has been performed.  Samantha [name] 05/30/2018, 2:25 PM

## 2018-05-30 NOTE — H&P (Deleted)
Cardiology Consultation    Patient ID: Samantha [name] MRN: [id number]; DOB: 01-14-1961   Admission date: 05/30/2018  Primary Care Provider: Carollee Herter, Alferd Apa, DO Primary Cardiologist: No primary care provider on file.  Primary Electrophysiologist:  None   Chief Complaint:  Chest pain/abdominal pain   History of Present Illness:   Samantha [name] is a 58 yo female with history of systemic lupus, HTN and tobacco abuse who presented to the ED this am with c/o chest pain and abdominal pain since last night. Her pain has been present for at least 12 hours. Her initial systolic BP on arrival to the ED was in the 60s. She was very tender to palpation in her abdomen. EKG with slight ST elevation in AVR and lead 3 with ST depression in the lateral and anterolateral leads. Code STEMI called by Dr. Wilson Singer. Ongoing chest pain and abdominal pain in the ED with abdominal tenderness. Levophed was started in the ED. Upon my arrival to the ED, Dr. Wilson Singer and I discussed her unusual presentation for acute MI and elected to perform a STAT CTA of the chest and abdomen to exclude aortic dissection or perforation in the abdomen. The CTA was negative for dissection or free air in the abdomen. Just as her CTA was interpreted as negative for an acute process, her troponin returned at >65. She was taken to the cath lab for emergent cardiac cath. BP 120/75 on arrival to the cath lab. As we were loading her onto the cath table, we received a call from the radiologist Dr. Polly Cobia that the patient had a hematoma in the ascending aorta. This likely represents an acute aortic syndrome due to dissection. The patient was taken back to the ED for further care. I personally spoke to Dr. Wilson Singer from the ED and Dr. Servando Snare who is on call for CT surgery today.    Past Medical History:  Diagnosis Date  . Altered mental status   . Backache, unspecified   . Gastric ulcer, unspecified as acute or chronic, without mention of hemorrhage,  perforation, or obstruction   . Helicobacter pylori (H. pylori)   . Lupus ([place])   . Tobacco abuse   . Unspecified essential hypertension     Past Surgical History:  Procedure Laterality Date  . UPPER GASTROINTESTINAL ENDOSCOPY  2013     Medications Prior to Admission: Prior to Admission medications   Medication Sig Start Date End Date Taking? Authorizing Provider  aspirin EC 81 MG tablet Take 1 tablet (81 mg total) by mouth daily. 08/05/16   Roma Schanz R, DO  cyclobenzaprine (FLEXERIL) 10 MG tablet TAKE 1 TABLET(10 MG) BY MOUTH THREE TIMES DAILY AS NEEDED 01/10/17   Carollee Herter, Alferd Apa, DO  dexlansoprazole (DEXILANT) 60 MG capsule Take 1 capsule (60 mg total) by mouth daily. 08/12/17   [name], DO  hydroxychloroquine (PLAQUENIL) 200 MG tablet Take 200 mg by mouth 2 (two) times daily.    [provider]  losartan (COZAAR) 100 MG tablet Take 1 tablet (100 mg total) by mouth daily. 11/28/17   Roma Schanz R, DO  metoprolol succinate (TOPROL-XL) 100 MG 24 hr tablet TAKE 1 TABLET BY MOUTH DAILY WITH OR IMMEDIATELY FOLLOWING A MEAL 03/16/18   [name], DO  Multiple Vitamin (MULTIVITAMIN) tablet Take 1 tablet by mouth daily.    [provider]  mycophenolate (CELLCEPT) 500 MG tablet Take 1,500 mg by mouth 2 (two) times daily.  [provider]  nicotine (NICOTROL) 10 MG inhaler Inhale 1 Cartridge (1 continuous puffing total) into the lungs as needed for smoking cessation. 02/17/18   Roma Schanz R, DO  pantoprazole (PROTONIX) 20 MG tablet Take 1 tablet (20 mg total) by mouth daily. 03/26/18   Orvan July, NP     Allergies:    Allergies  Allergen Reactions  . Tramadol Nausea Only    Feels weirded out    Social History:   Social History   Socioeconomic History  . Marital status: Single    Spouse name: Not on file  . Number of children: Not on file  . Years of education: Not on file  . Highest education level:  Not on file  Occupational History  . Occupation: permanant disability    Employer: [employer]  Social Needs  . Financial resource strain: Not on file  . Food insecurity:    Worry: Not on file    Inability: Not on file  . Transportation needs:    Medical: Not on file    Non-medical: Not on file  Tobacco Use  . Smoking status: Current Every Day Smoker    Packs/day: 0.75    Years: 40.00    Pack years: 30.00    Types: Cigarettes  . Smokeless tobacco: Never Used  . Tobacco comment: 1 cig a day  Substance and Sexual Activity  . Alcohol use: Yes    Comment: occassional; beer   . Drug use: No  . Sexual activity: Not Currently    Partners: Male  Lifestyle  . Physical activity:    Days per week: Not on file    Minutes per session: Not on file  . Stress: Not on file  Relationships  . Social connections:    Talks on phone: Not on file    Gets together: Not on file    Attends religious service: Not on file    Active member of club or organization: Not on file    Attends meetings of clubs or organizations: Not on file    Relationship status: Not on file  . Intimate partner violence:    Fear of current or ex partner: Not on file    Emotionally abused: Not on file    Physically abused: Not on file    Forced sexual activity: Not on file  Other Topics Concern  . Not on file  Social History Narrative   Exercise--- walking    Family History:   The patient's family history includes Diabetes in her sister; Hypertension in her mother. There is no history of Colon cancer or Stomach cancer.    ROS:  Please see the history of present illness.  All other ROS reviewed and negative.     Physical Exam/Data:   Vitals:   05/30/18 1117 05/30/18 1120 05/30/18 1122 05/30/18 1130  BP: 130/77 120/71 129/82 129/72  Pulse:   70 68  Resp: (!) 24 (!) 27 (!) 26 (!) 27  Temp:      TempSrc:      SpO2:   99% 99%  Weight:      Height:        Intake/Output Summary (Last 24 hours) at 05/30/2018  1136 Last data filed at 05/30/2018 1048 Gross per 24 hour  Intake 3.27 ml  Output -  Net 3.27 ml   Last 3 Weights 05/30/2018 04/20/2018 02/17/2018  Weight (lbs) 160 lb 162 lb 161 lb 3.2 oz  Weight (kg) 72.576 kg 73.483 kg  73.12 kg     Body mass index is 24.33 kg/m.  General:  Appears uncomfortable HEENT: normal Lymph: no adenopathy Neck: no JVD Endocrine:  No thryomegaly Vascular: No carotid bruits; FA pulses 2+ bilaterally without bruits  Cardiac:  normal S1, S2; RRR; no murmur  Lungs:  clear to auscultation bilaterally, no wheezing, rhonchi or rales  Abd: soft, no distention, tender to palpation.  Ext: no LE edema Musculoskeletal:  No deformities, BUE and BLE strength normal and equal Skin: warm and dry  Neuro:  CNs 2-12 intact, no focal abnormalities noted Psych:  Normal affect    EKG:  The ECG that was done was personally reviewed and demonstrates sinus rhythm with subtle ST elevation in AVR and lead 3 with ST depression lateral and anterolateral leads  Relevant CV Studies:   Laboratory Data:  Chemistry Recent Labs  Lab 05/30/18 1011  NA 132*  K 3.5  CL 100  CO2 16*  GLUCOSE 112*  BUN 21*  CREATININE 1.48*  CALCIUM 8.6*  GFRNONAA 39*  GFRAA 45*  ANIONGAP 16*    No results for input(s): PROT, ALBUMIN, AST, ALT, ALKPHOS, BILITOT in the last 168 hours. HematologyNo results for input(s): WBC, RBC, HGB, HCT, MCV, MCH, MCHC, RDW, PLT in the last 168 hours. Cardiac Enzymes Recent Labs  Lab 05/30/18 1011  TROPONINI >65.00*   No results for input(s): TROPIPOC in the last 168 hours.  BNPNo results for input(s): BNP, PROBNP in the last 168 hours.  DDimer No results for input(s): DDIMER in the last 168 hours.  Radiology/Studies:  Dg Chest Portable 1 View  Result Date: 05/30/2018 CLINICAL DATA:  Chest pain EXAM: PORTABLE CHEST 1 VIEW COMPARISON:  01/12/2018 chest radiograph. FINDINGS: Stable cardiomediastinal silhouette with top-normal heart size. No pneumothorax. No  pleural effusion. Lungs appear clear, with no acute consolidative airspace disease and no pulmonary edema. IMPRESSION: No active disease. Electronically Signed   By: Ilona Sorrel M.D.   On: 05/30/2018 10:48    Assessment and Plan:   1. Acute aortic dissection: Code STEMI was called but she was found to have a dissection of the ascending aorta. This was not reported on the initial reading from radiology but was confirmed prior to the start of the cardiac cath. She was not moved to the cath table. No access obtained in the cath lab. Pt taken back to the ED for further care and STAT CT surgery consultation.       Signed, Lauree Chandler, MD  05/30/2018 11:36 AM

## 2018-05-30 NOTE — Progress Notes (Signed)
   Cardiology Critical Care Note  58 y/o woman with h/o SLE, HTN and tobacco use. Presented to ED with acute chest and ab pain. ECG with minimal ST elevation in AVR. Felt to have aortic dissection. Initial CT read as no dissection so patient taken emergently to cath lab. Trop > 65. However once on table Radiologist called to suggest that there was probable hematoma around aortic arch. Patient taken off cath table and emergently to OR for possible dissection involving aortic root and RCA.   TEE placed in OR and no evidence of aortic dissection.   I was called urgently to review TEE images. On my arrival patient intubated under GA.   I repeated TEE and also toke some chest wall images. There was no evidence of dissection or abnormality of aortic root involving RCA. Echo showed inferior wall motional abnormalities. Findings suggestive of acute inferior MI. D/w Drs. Servando Snare and Cane [place] and patient transported back to cath lab for emergent coronary angiography by Dr. Angelena Form. Cath revealed acute thrombotic lesion of RCA.   Critical care time 45 mins.    Glori Bickers, MD  3:51 PM

## 2018-05-30 NOTE — ED Provider Notes (Signed)
[facility] EMERGENCY DEPARTMENT Provider Note   CSN: [id number] Arrival date & time: 05/30/18  2841    History   Chief Complaint Chief Complaint  Patient presents with  . Chest Pain    HPI Samantha [name] is a 58 y.o. female.  HPI   57yF with chest pain, abdominal pain. Onset yesterday evening. It woke her up from a nap. Constant since then. Feels pain up into her face and shoulders. Nauseated. Feels like she needs to vomit but cannot. No cough. No fever. No dyspnea. Said she was sweaty when symptoms first began but not since. No hx of similar symptoms.   Past Medical History:  Diagnosis Date  . Altered mental status   . Backache, unspecified   . Gastric ulcer, unspecified as acute or chronic, without mention of hemorrhage, perforation, or obstruction   . Helicobacter pylori (H. pylori)   . Lupus (HCC)   . Tobacco abuse   . Unspecified essential hypertension     Patient Active Problem List   Diagnosis Date Noted  . Chronic bilateral low back pain without sciatica 12/08/2017  . Hyperlipidemia LDL goal <100 02/11/2017  . Gastroesophageal reflux disease without esophagitis 02/11/2017  . HTN (hypertension) 06/23/2015  . Abdominal pain, epigastric 06/23/2015  . Polypharmacy 06/30/2012  . Tobacco use disorder 11/22/2011  . Achilles tendinitis 11/20/2011  . Lupus (systemic lupus erythematosus) (HCC) 08/22/2011  . Disseminated lupus erythematosus (HCC) 08/21/2011  . 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 Headache(784.0) 03/09/2011  . Back pain 03/09/2011  . Hyponatremia 03/09/2011  . Hypokalemia 03/09/2011    Past Surgical History:  Procedure Laterality Date  . UPPER GASTROINTESTINAL ENDOSCOPY  2013     OB History   No obstetric history on file.      Home Medications    Prior to Admission medications   Medication Sig Start Date End Date Taking? Authorizing Provider  aspirin EC 81 MG tablet Take 1 tablet (81 mg total) by mouth  daily. 08/05/16   Seabron Spates R, DO  cyclobenzaprine (FLEXERIL) 10 MG tablet TAKE 1 TABLET(10 MG) BY MOUTH THREE TIMES DAILY AS NEEDED 01/10/17   Zola Button, Grayling Congress, DO  dexlansoprazole (DEXILANT) 60 MG capsule Take 1 capsule (60 mg total) by mouth daily. 08/12/17   Donato Schultz, DO  hydroxychloroquine (PLAQUENIL) 200 MG tablet Take 200 mg by mouth 2 (two) times daily.    [provider]  losartan (COZAAR) 100 MG tablet Take 1 tablet (100 mg total) by mouth daily. 11/28/17   Seabron Spates R, DO  metoprolol succinate (TOPROL-XL) 100 MG 24 hr tablet TAKE 1 TABLET BY MOUTH DAILY WITH OR IMMEDIATELY FOLLOWING A MEAL 03/16/18   Donato Schultz, DO  Multiple Vitamin (MULTIVITAMIN) tablet Take 1 tablet by mouth daily.    [provider]  mycophenolate (CELLCEPT) 500 MG tablet Take 1,500 mg by mouth 2 (two) times daily.     [provider]  nicotine (NICOTROL) 10 MG inhaler Inhale 1 Cartridge (1 continuous puffing total) into the lungs as needed for smoking cessation. 02/17/18   Seabron Spates R, DO  pantoprazole (PROTONIX) 20 MG tablet Take 1 tablet (20 mg total) by mouth daily. 03/26/18   Janace Aris, NP    Family History Family History  Problem Relation Age of Onset  . Diabetes Sister   . Hypertension Mother   . Colon cancer Neg Hx   . Stomach cancer Neg Hx  Social History Social History   Tobacco Use  . Smoking status: Current Every Day Smoker    Packs/day: 0.75    Years: 40.00    Pack years: 30.00    Types: Cigarettes  . Smokeless tobacco: Never Used  . Tobacco comment: 1 cig a day  Substance Use Topics  . Alcohol use: Yes    Comment: occassional; beer   . Drug use: No     Allergies   Tramadol   Review of Systems Review of Systems  All systems reviewed and negative, other than as noted in HPI.  Physical Exam Updated Vital Signs BP (!) 65/47   Pulse (!) 55   Temp 98.1 F (36.7 C) (Oral)   Resp (!) 22   Ht  5\' 8"  (1.727 m)   Wt 72.6 kg   LMP 04/11/2011   SpO2 99%   BMI 24.33 kg/m   Physical Exam Vitals signs and nursing note reviewed.  Constitutional:      Appearance: She is well-developed. She is ill-appearing.     Comments: Laying in bed. Appears uncomfortable.   HENT:     Head: Normocephalic and atraumatic.  Eyes:     General:        Right eye: No discharge.        Left eye: No discharge.     Conjunctiva/sclera: Conjunctivae normal.  Neck:     Musculoskeletal: Neck supple.  Cardiovascular:     Rate and Rhythm: Regular rhythm. Bradycardia present.     Heart sounds: Normal heart sounds. No murmur. No friction rub. No gallop.      Comments: Mild bradycardia Pulmonary:     Effort: Pulmonary effort is normal. No respiratory distress.     Breath sounds: Normal breath sounds.  Chest:     Chest wall: Tenderness present.  Abdominal:     General: There is no distension.     Palpations: Abdomen is soft.     Tenderness: There is abdominal tenderness.  Musculoskeletal:        General: No tenderness.     Comments: Lower extremities symmetric as compared to each other. No calf tenderness. Negative Homan's. No palpable cords.   Skin:    General: Skin is warm and dry.  Neurological:     Mental Status: She is alert.  Psychiatric:        Behavior: Behavior normal.        Thought Content: Thought content normal.      ED Treatments / Results  Labs (all labs ordered are listed, but only abnormal results are displayed) Labs Reviewed  TROPONIN I - Abnormal; Notable for the following components:      Result Value   Troponin I >65.00 (*)    All other components within normal limits  BASIC METABOLIC PANEL - Abnormal; Notable for the following components:   Sodium 132 (*)    CO2 16 (*)    Glucose, Bld 112 (*)    BUN 21 (*)    Creatinine, Ser 1.48 (*)    Calcium 8.6 (*)    GFR calc non Af Amer 39 (*)    GFR calc Af Amer 45 (*)    Anion gap 16 (*)    All other components within  normal limits  LACTIC ACID, PLASMA - Abnormal; Notable for the following components:   Lactic Acid, Venous 2.0 (*)    All other components within normal limits  HEPATIC FUNCTION PANEL - Abnormal; Notable for the following components:  Albumin 3.2 (*)    AST 425 (*)    ALT 76 (*)    Bilirubin, Direct 0.3 (*)    All other components within normal limits  CBC - Abnormal; Notable for the following components:   Hemoglobin 11.4 (*)    All other components within normal limits  SARS CORONAVIRUS 2 (HOSPITAL ORDER, PERFORMED IN [facility])  MAGNESIUM  PROTIME-INR  APTT  LIPASE, BLOOD  LIPID PANEL  URINALYSIS, ROUTINE W REFLEX MICROSCOPIC  LACTIC ACID, PLASMA  COMPREHENSIVE METABOLIC PANEL  PROTIME-INR  I-STAT BETA HCG BLOOD, ED ([facility], [facility], [facility] ONLY)  TYPE AND SCREEN  PREPARE PLATELET PHERESIS  PREPARE RBC (CROSSMATCH)  ABO/RH    EKG EKG Interpretation  Date/Time:  Saturday May 30 2018 11:58:44 EDT Ventricular Rate:  41 PR Interval:    QRS Duration: 106 QT Interval:  474 QTC Calculation: 392 R Axis:   55 Text Interpretation:  AV block, varying conduction Repol abnrm, severe global ischemia (LM/MVD) Confirmed by Raeford Razor [phone]) on 05/30/2018 3:59:47 PM   Radiology Dg Chest Portable 1 View  Result Date: 05/30/2018 CLINICAL DATA:  Chest pain EXAM: PORTABLE CHEST 1 VIEW COMPARISON:  01/12/2018 chest radiograph. FINDINGS: Stable cardiomediastinal silhouette with top-normal heart size. No pneumothorax. No pleural effusion. Lungs appear clear, with no acute consolidative airspace disease and no pulmonary edema. IMPRESSION: No active disease. Electronically Signed   By: Delbert Phenix M.D.   On: 05/30/2018 10:48   Ct Angio Chest/abd/pel For Dissection W And/or Wo Contrast  Result Date: 05/30/2018 CLINICAL DATA:  Severe chest pain radiating into the back with nausea and vomiting. Hypotension. EXAM: CT ANGIOGRAPHY CHEST, ABDOMEN AND PELVIS TECHNIQUE: Multidetector CT  imaging through the chest, abdomen and pelvis was performed using the standard protocol during bolus administration of intravenous contrast. Multiplanar reconstructed images and MIPs were obtained and reviewed to evaluate the vascular anatomy. CONTRAST:  OMNIPAQUE IOHEXOL 350 MG/ML SOLN COMPARISON:  Chest radiograph from earlier today. 03/06/2018 screening chest CT. 12/13/2015 CT abdomen/pelvis. FINDINGS: CTA CHEST FINDINGS Cardiovascular: Mild cardiomegaly. No significant pericardial effusion/thickening. Left anterior descending and right coronary atherosclerosis. Atherosclerotic nonaneurysmal thoracic aorta. There is new wall thickening in the ascending thoracic aorta compared to 03/06/2018 screening chest CT study, which extends to the junction with the aortic arch and which measures complex fluid density, which may indicate acute intramural hematoma. No dissection flap within the thoracic aorta. No pseudoaneurysm. No penetrating atherosclerotic ulcer. Aortic arch branch vessels are patent. Normal caliber pulmonary arteries. No central pulmonary emboli. Mediastinum/Nodes: No discrete thyroid nodules. Unremarkable esophagus. No pathologically enlarged axillary, mediastinal or hilar lymph nodes. Lungs/Pleura: No pneumothorax. No pleural effusion. Mild centrilobular and paraseptal emphysema with mild diffuse bronchial wall thickening. Low-attenuation material obstructs the left lower lobe bronchus. Subsegmental scarring versus atelectasis in the left lower lobe. No acute consolidative airspace disease, lung masses or significant pulmonary nodules. Musculoskeletal: No aggressive appearing focal osseous lesions. Mild thoracic spondylosis. Review of the MIP images confirms the above findings. CTA ABDOMEN AND PELVIS FINDINGS VASCULAR Aorta: Normal caliber aorta without aneurysm, dissection, vasculitis or significant stenosis. Celiac: Patent without evidence of aneurysm, dissection, vasculitis or significant  stenosis. SMA: Patent without evidence of aneurysm, dissection, vasculitis or significant stenosis. Renals: Both renal arteries are patent without evidence of aneurysm, dissection, vasculitis, fibromuscular dysplasia or significant stenosis. IMA: Patent without evidence of aneurysm, dissection, vasculitis or significant stenosis. Inflow: Patent without evidence of aneurysm, dissection, vasculitis or significant stenosis. Veins: No obvious venous abnormality within the limitations  of this arterial phase study. Review of the MIP images confirms the above findings. NON-VASCULAR Hepatobiliary: Normal liver with no liver mass. Normal gallbladder with no radiopaque cholelithiasis. No biliary ductal dilatation. Pancreas: Normal, with no mass or duct dilation. Spleen: Normal size. No mass. Adrenals/Urinary Tract: Normal adrenals. Simple 8.6 cm anterior lower right renal cyst. No hydronephrosis. Normal bladder. Stomach/Bowel: Normal non-distended stomach. Normal caliber small bowel with no small bowel wall thickening. Normal appendix. Normal large bowel with no diverticulosis, large bowel wall thickening or pericolonic fat stranding. Vascular/Lymphatic: Atherosclerotic nonaneurysmal abdominal aorta. No pathologically enlarged lymph nodes in the abdomen or pelvis. Reproductive: Mildly enlarged myomatous uterus with calcified uterine fibroids. No adnexal masses. Other: No pneumoperitoneum, ascites or focal fluid collection. Musculoskeletal: No aggressive appearing focal osseous lesions. Moderate lower lumbar spondylosis. Review of the MIP images confirms the above findings. IMPRESSION: 1. New wall thickening in the ascending thoracic aorta compared to 03/06/2018 screening chest CT study, measuring complex fluid density, favored to represent acute aortic syndrome due to type A acute intramural hematoma. No visible dissection flap. No aneurysm or pseudoaneurysm. 2. Mild cardiomegaly.  Two-vessel coronary atherosclerosis. 3. Mild  centrilobular and paraseptal emphysema with mild diffuse bronchial wall thickening, suggesting COPD. 4. Mildly enlarged calcified myomatous uterus. 5. Aortic Atherosclerosis (ICD10-I70.0) and Emphysema (ICD10-J43.9). Critical Value/emergent results were called by telephone at the time of interpretation on 05/30/2018 at 11:36 am to Dr. Raeford Razor and to Dr. Camillo Flaming in the cath lab, who verbally acknowledged these results. Electronically Signed   By: Delbert Phenix M.D.   On: 05/30/2018 11:42    Procedures Procedures (including critical care time)  CRITICAL CARE Performed by: Raeford Razor Total critical care time: 120 minutes Critical care time was exclusive of separately billable procedures and treating other patients. Critical care was necessary to treat or prevent imminent or life-threatening deterioration. Critical care was time spent personally by me on the following activities: development of treatment plan with patient and/or surrogate as well as nursing, discussions with consultants, evaluation of patient's response to treatment, examination of patient, obtaining history from patient or surrogate, ordering and performing treatments and interventions, ordering and review of laboratory studies, ordering and review of radiographic studies, pulse oximetry and re-evaluation of patient's condition.   Medications Ordered in ED Medications  0.9 %  sodium chloride infusion ( Intravenous New Bag/Given 05/30/18 1026)  norepinephrine (LEVOPHED) 4mg  in premix infusion (3 mcg/min Intravenous Rate/Dose Change 05/30/18 1530)  insulin regular, human (MYXREDLIN) 100 units/ 100 mL infusion (has no administration in time range)  EPINEPHrine (ADRENALIN) 4 mg in dextrose 5 % 250 mL (0.016 mg/mL) infusion (has no administration in time range)  DOPamine (INTROPIN) 800 mg in dextrose 5 % 250 mL (3.2 mg/mL) infusion (has no administration in time range)  milrinone (PRIMACOR) 20 MG/100 ML (0.2 mg/mL) infusion  (has no administration in time range)  nitroGLYCERIN 50 mg in dextrose 5 % 250 mL (0.2 mg/mL) infusion (has no administration in time range)  phenylephrine (NEOSYNEPHRINE) 20-0.9 MG/250ML-% infusion (has no administration in time range)  heparin 2,500 Units, papaverine 30 mg in electrolyte-148 (PLASMALYTE-148) 500 mL irrigation (has no administration in time range)  heparin 30,000 units/NS 1000 mL solution for CELLSAVER (has no administration in time range)  potassium chloride injection 80 mEq (has no administration in time range)  magnesium sulfate (IV Push/IM) injection 40 mEq (has no administration in time range)  tranexamic acid (CYKLOKAPRON) pump prime solution 145 mg (has no administration in time range)  tranexamic  acid (CYKLOKAPRON) bolus via infusion - over 30 minutes 1,089 mg (has no administration in time range)  tranexamic acid (CYKLOKAPRON) 2,500 mg in sodium chloride 0.9 % 250 mL (10 mg/mL) infusion (has no administration in time range)  vancomycin (VANCOCIN) 1,250 mg in sodium chloride 0.9 % 250 mL IVPB (has no administration in time range)  cefUROXime (ZINACEF) 1.5 g in sodium chloride 0.9 % 100 mL IVPB (has no administration in time range)  cefUROXime (ZINACEF) 750 mg in sodium chloride 0.9 % 100 mL IVPB (has no administration in time range)  norepinephrine (LEVOPHED) 4mg  in premix infusion (has no administration in time range)  chlorhexidine (PERIDEX) 0.12 % solution 15 mL (has no administration in time range)  bisacodyl (DULCOLAX) EC tablet 5 mg (has no administration in time range)  temazepam (RESTORIL) capsule 15 mg (has no administration in time range)  metoprolol tartrate (LOPRESSOR) tablet 12.5 mg (has no administration in time range)  Chlorhexidine Gluconate Cloth 2 % PADS 6 each (has no administration in time range)    And  Chlorhexidine Gluconate Cloth 2 % PADS 6 each (has no administration in time range)  [facility] Blood Cardioplegia (KBC) lidocaine 2% Syringe  (13mL) (has no administration in time range)  [facility] Blood Cardioplegia (KBC) lidocaine 2% Syringe (13mL) (has no administration in time range)  [facility] Blood Cardioplegia (KBC) mannitol 20% Syringe (32mL) (has no administration in time range)  [facility] Blood Cardioplegia (KBC) mannitol 20% Syringe (32mL) (has no administration in time range)  Heparin (Porcine) in NaCl 1000-0.9 UT/500ML-% SOLN (500 mLs  Given 05/30/18 1506)  heparin injection (10,000 Units Intravenous Given 05/30/18 1517)  cangrelor (KENGREAL) bolus via infusion (2,178 mcg Intravenous Given 05/30/18 1523)  cangrelor (KENGREAL) 50,000 mcg in sodium chloride 0.9 % 250 mL (200 mcg/mL) infusion (4 mcg/kg/min  72.6 kg Intravenous New Bag/Given 05/30/18 1523)  sodium chloride 0.9 % bolus 1,000 mL (0 mLs Intravenous Stopped 05/30/18 1150)  aspirin chewable tablet 324 mg (324 mg Oral Given 05/30/18 1025)  heparin injection 4,000 Units (4,000 Units Intravenous Given 05/30/18 1027)  ondansetron (ZOFRAN) injection 4 mg (4 mg Intravenous Given 05/30/18 1041)  iohexol (OMNIPAQUE) 350 MG/ML injection 100 mL (100 mLs Intravenous Contrast Given 05/30/18 1057)  protamine injection 40 mg (40 mg Intravenous Given 05/30/18 1202)  fentaNYL (SUBLIMAZE) injection 50 mcg (50 mcg Intravenous Given 05/30/18 1159)  dexmedetomidine (PRECEDEX) 400 MCG/100ML (4 mcg/mL) infusion (0.7 mcg/kg/hr  72.6 kg Intravenous New Bag/Given 05/30/18 1420)     Initial Impression / Assessment and Plan / ED Course  I have reviewed the triage vital signs and the nursing notes.  Pertinent labs & imaging results that were available during my care of the patient were reviewed by me and considered in my medical decision making (see chart for details).  -57yF with CP, abdominal, shoulder pain since last night. EKG with elevation in aVR and diffuse ischemic changes elsewhere. Perhaps some mild elevation in III only but poor tracings. Consider LM disease. Could also be from subendocardial  ischemia in setting of hypotension from other cause. Doesn't appear to have diagnosed hx of CAD but coronary artery calcifications noted on recent CT of chest.  I would expect a PE massive enough to cause persistent hypotension to also cause dyspnea and hypoxemia. o2 sats are normal on RA.   Her abdomen is tender but her exam is difficult. She also reacts to palpation of her chest wall although to a lesser degree. Abdominal US from 2018 w/o AAA. Hx of PUD.  Consider perforated ulcer. I helped x-ray tech sit pt as upright as we could. I did not see any obvious free air. She is having pain primarily up a lot higher than I would expect from intraabdominal process.   Consider dissection. Has hx of lupus which would put her at increased risk for aortitis/dissection. Seen by Dr Clifton James, cardiology, in the ED. Clinical picture is not fitting into clear cut etiology at this time. Concerned that dissection needs to be ruled out prior to taking to cath lab.   -CT w/o obvious alternative explanatory pathology. Troponin >65. Pt going to cath lab.   -Called by radiology after further review of imaging. 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 Radiology referred to cath lab to discuss with Dr Clifton James. Pt to come back to the ER. Discussed case with Dr Tyrone Sage, CTS. Discussed with pharmacy for protamine.   Final Clinical Impressions(s) / ED Diagnoses   Final diagnoses:  NSTEMI (non-ST elevated myocardial infarction) [facility])  Cardiogenic shock [facility])    ED Discharge Orders    None       Raeford Razor, MD 05/30/18 1600

## 2018-05-30 NOTE — Consult Note (Deleted)
Show:Clear all [x] Manual[x] Template[] Copied  Added by: [x] Burnell Blanks, MD  [] Hover for details   Cardiology Consultation    Patient ID: ODEAN MCELWAIN MRN: [id number]; DOB: 18-Sep-1960   Admission date: 05/30/2018  Primary Care Provider: [name], DO Primary Cardiologist: No primary care provider on file.  Primary Electrophysiologist:  None   Chief Complaint:  Chest pain/abdominal pain   History of Present Illness:   Ms. Yohn is a 58 yo female with history of systemic lupus, HTN and tobacco abuse who presented to the ED this am with c/o chest pain and abdominal pain since last night. Her pain has been present for at least 12 hours. Her initial systolic BP on arrival to the ED was in the 60s. She was very tender to palpation in her abdomen. EKG with slight ST elevation in AVR and lead 3 with ST depression in the lateral and anterolateral leads. Code STEMI called by Dr. Wilson Singer. Ongoing chest pain and abdominal pain in the ED with abdominal tenderness. Levophed was started in the ED. Upon my arrival to the ED, Dr. Wilson Singer and I discussed her unusual presentation for acute MI and elected to perform a STAT CTA of the chest and abdomen to exclude aortic dissection or perforation in the abdomen. The CTA was initially felt to be negative for dissection or free air in the abdomen. Just as her CTA was interpreted as negative for an acute process, her troponin returned at >65. She was taken to the cath lab for emergent cardiac cath. BP 120/75 on arrival to the cath lab. As we were loading her onto the cath table, we received a call from the radiologist Dr. Polly Cobia that the patient had evidence of hematoma in the ascending aorta wall. This likely represents an acute aortic syndrome due to dissection. The patient was taken back to the ED for further care. I personally spoke to Dr. Wilson Singer from the ED and Dr. Servando Snare who is on call for CT surgery today.        Past  Medical History:  Diagnosis Date  . Altered mental status   . Backache, unspecified   . Gastric ulcer, unspecified as acute or chronic, without mention of hemorrhage, perforation, or obstruction   . Helicobacter pylori (H. pylori)   . Lupus ([place])   . Tobacco abuse   . Unspecified essential hypertension          Past Surgical History:  Procedure Laterality Date  . UPPER GASTROINTESTINAL ENDOSCOPY  2013     Medications Prior to Admission:        Prior to Admission medications   Medication Sig Start Date End Date Taking? Authorizing Provider  aspirin EC 81 MG tablet Take 1 tablet (81 mg total) by mouth daily. 08/05/16   Roma Schanz R, DO  cyclobenzaprine (FLEXERIL) 10 MG tablet TAKE 1 TABLET(10 MG) BY MOUTH THREE TIMES DAILY AS NEEDED 01/10/17   Carollee Herter, Alferd Apa, DO  dexlansoprazole (DEXILANT) 60 MG capsule Take 1 capsule (60 mg total) by mouth daily. 08/12/17   [name], DO  hydroxychloroquine (PLAQUENIL) 200 MG tablet Take 200 mg by mouth 2 (two) times daily.    [provider]  losartan (COZAAR) 100 MG tablet Take 1 tablet (100 mg total) by mouth daily. 11/28/17   Roma Schanz R, DO  metoprolol succinate (TOPROL-XL) 100 MG 24 hr tablet TAKE 1 TABLET BY MOUTH DAILY WITH OR IMMEDIATELY FOLLOWING A MEAL 03/16/18   Carollee Herter,  Alferd Apa, DO  Multiple Vitamin (MULTIVITAMIN) tablet Take 1 tablet by mouth daily.    [provider]  mycophenolate (CELLCEPT) 500 MG tablet Take 1,500 mg by mouth 2 (two) times daily.     [provider]  nicotine (NICOTROL) 10 MG inhaler Inhale 1 Cartridge (1 continuous puffing total) into the lungs as needed for smoking cessation. 02/17/18   Roma Schanz R, DO  pantoprazole (PROTONIX) 20 MG tablet Take 1 tablet (20 mg total) by mouth daily. 03/26/18   Orvan July, NP     Allergies:         Allergies  Allergen Reactions  . Tramadol Nausea Only    Feels  weirded out    Social History:   Social History        Socioeconomic History  . Marital status: Single    Spouse name: Not on file  . Number of children: Not on file  . Years of education: Not on file  . Highest education level: Not on file  Occupational History  . Occupation: permanant disability    Employer: [employer]  Social Needs  . Financial resource strain: Not on file  . Food insecurity:    Worry: Not on file    Inability: Not on file  . Transportation needs:    Medical: Not on file    Non-medical: Not on file  Tobacco Use  . Smoking status: Current Every Day Smoker    Packs/day: 0.75    Years: 40.00    Pack years: 30.00    Types: Cigarettes  . Smokeless tobacco: Never Used  . Tobacco comment: 1 cig a day  Substance and Sexual Activity  . Alcohol use: Yes    Comment: occassional; beer   . Drug use: No  . Sexual activity: Not Currently    Partners: Male  Lifestyle  . Physical activity:    Days per week: Not on file    Minutes per session: Not on file  . Stress: Not on file  Relationships  . Social connections:    Talks on phone: Not on file    Gets together: Not on file    Attends religious service: Not on file    Active member of club or organization: Not on file    Attends meetings of clubs or organizations: Not on file    Relationship status: Not on file  . Intimate partner violence:    Fear of current or ex partner: Not on file    Emotionally abused: Not on file    Physically abused: Not on file    Forced sexual activity: Not on file  Other Topics Concern  . Not on file  Social History Narrative   Exercise--- walking    Family History:   The patient's family history includes Diabetes in her sister; Hypertension in her mother. There is no history of Colon cancer or Stomach cancer.    ROS:  Please see the history of present illness.  All other ROS reviewed and negative.     Physical  Exam/Data:         Vitals:   05/30/18 1117 05/30/18 1120 05/30/18 1122 05/30/18 1130  BP: 130/77 120/71 129/82 129/72  Pulse:   70 68  Resp: (!) 24 (!) 27 (!) 26 (!) 27  Temp:      TempSrc:      SpO2:   99% 99%  Weight:      Height:  Intake/Output Summary (Last 24 hours) at 05/30/2018 1136 Last data filed at 05/30/2018 1048    Gross per 24 hour  Intake 3.27 ml  Output -  Net 3.27 ml   Last 3 Weights 05/30/2018 04/20/2018 02/17/2018  Weight (lbs) 160 lb 162 lb 161 lb 3.2 oz  Weight (kg) 72.576 kg 73.483 kg 73.12 kg     Body mass index is 24.33 kg/m.  General:  Appears uncomfortable HEENT: normal Lymph: no adenopathy Neck: no JVD Endocrine:  No thryomegaly Vascular: No carotid bruits; FA pulses 2+ bilaterally without bruits  Cardiac:  normal S1, S2; RRR; no murmur  Lungs:  clear to auscultation bilaterally, no wheezing, rhonchi or rales  Abd: soft, no distention, tender to palpation.  Ext: no LE edema Musculoskeletal:  No deformities, BUE and BLE strength normal and equal Skin: warm and dry  Neuro:  CNs 2-12 intact, no focal abnormalities noted Psych:  Normal affect    EKG:  The ECG that was done was personally reviewed and demonstrates sinus rhythm with subtle ST elevation in AVR and lead 3 with ST depression lateral and anterolateral leads  Relevant CV Studies:   Laboratory Data:  Chemistry LastLabs     Recent Labs  Lab 05/30/18 1011  NA 132*  K 3.5  CL 100  CO2 16*  GLUCOSE 112*  BUN 21*  CREATININE 1.48*  CALCIUM 8.6*  GFRNONAA 39*  GFRAA 45*  ANIONGAP 16*      LastLabs  No results for input(s): PROT, ALBUMIN, AST, ALT, ALKPHOS, BILITOT in the last 168 hours.   Hematology LastLabs  No results for input(s): WBC, RBC, HGB, HCT, MCV, MCH, MCHC, RDW, PLT in the last 168 hours.   Cardiac Enzymes LastLabs     Recent Labs  Lab 05/30/18 1011  TROPONINI >65.00*      LastLabs  No results for  input(s): TROPIPOC in the last 168 hours.    BNP LastLabs  No results for input(s): BNP, PROBNP in the last 168 hours.    DDimer  Elie Confer  No results for input(s): DDIMER in the last 168 hours.    Radiology/Studies:  Dg Chest Portable 1 View  Result Date: 05/30/2018 CLINICAL DATA:  Chest pain EXAM: PORTABLE CHEST 1 VIEW COMPARISON:  01/12/2018 chest radiograph. FINDINGS: Stable cardiomediastinal silhouette with top-normal heart size. No pneumothorax. No pleural effusion. Lungs appear clear, with no acute consolidative airspace disease and no pulmonary edema. IMPRESSION: No active disease. Electronically Signed   By: Ilona Sorrel M.D.   On: 05/30/2018 10:48    Assessment and Plan:   1. Acute thoracic aortic dissection: Code STEMI was called but she was found to have a dissection of the ascending aorta. This was not reported on the initial reading from radiology but was confirmed prior to the start of the cardiac cath. She was not moved to the cath table. No access obtained in the cath lab. Pt taken back to the ED for further care and STAT CT surgery consultation.       Signed, Lauree Chandler, MD  05/30/2018 12:04PM

## 2018-05-30 NOTE — Anesthesia Preprocedure Evaluation (Addendum)
Anesthesia Evaluation  Patient identified by MRN, date of birth, ID band Patient awake    Reviewed: Allergy & Precautions, NPO status , Patient's Chart, lab work & pertinent test results  Airway Mallampati: I  TM Distance: >3 FB Neck ROM: Full    Dental  (+) Edentulous Upper, Dental Advisory Given   Pulmonary neg pulmonary ROS, Current Smoker,    breath sounds clear to auscultation       Cardiovascular hypertension, Pt. on medications and Pt. on home beta blockers + Past MI  negative cardio ROS   Rhythm:Regular Rate:Tachycardia  Elevated troponins   Neuro/Psych  Headaches, negative neurological ROS  negative psych ROS   GI/Hepatic negative GI ROS, Neg liver ROS, PUD, GERD  ,  Endo/Other  negative endocrine ROS  Renal/GU Renal InsufficiencyRenal diseasenegative Renal ROS  negative genitourinary   Musculoskeletal negative musculoskeletal ROS (+)   Abdominal   Peds negative pediatric ROS (+)  Hematology negative hematology ROS (+) SLE   Anesthesia Other Findings   Reproductive/Obstetrics negative OB ROS                            Anesthesia Physical Anesthesia Plan  ASA: IV and emergent  Anesthesia Plan: General   Post-op Pain Management:    Induction: Intravenous, Rapid sequence and Cricoid pressure planned  PONV Risk Score and Plan: Ondansetron, Dexamethasone and Treatment may vary due to age or medical condition  Airway Management Planned: Oral ETT  Additional Equipment: Arterial line, PA Cath, 3D TEE and Ultrasound Guidance Line Placement  Intra-op Plan:   Post-operative Plan: Post-operative intubation/ventilation  Informed Consent: I have reviewed the patients History and Physical, chart, labs and discussed the procedure including the risks, benefits and alternatives for the proposed anesthesia with the patient or authorized representative who has indicated his/her  understanding and acceptance.     Dental advisory given  Plan Discussed with: CRNA, Anesthesiologist and Surgeon  Anesthesia Plan Comments:        Anesthesia Quick Evaluation

## 2018-05-30 NOTE — OR Nursing (Signed)
Patient evaluated by Cardiologist and surgeon r/t TEE findings and was sent to cath lab.  Results pending, OR and staff on hold per Dr. Everrett Coombe request.

## 2018-05-30 NOTE — ED Notes (Signed)
CT called for stat scan. Patient transferred by RN.

## 2018-05-30 NOTE — Anesthesia Procedure Notes (Signed)
Central Venous Catheter Insertion Performed by: Duane Boston, MD, anesthesiologist Start/End5/02/2018 12:41 PM, 05/30/2018 12:51 PM Patient location: Pre-op. Preanesthetic checklist: patient identified, IV checked, site marked, risks and benefits discussed, surgical consent, monitors and equipment checked, pre-op evaluation, timeout performed and anesthesia consent Position: Trendelenburg Lidocaine 1% used for infiltration and patient sedated Hand hygiene performed , maximum sterile barriers used  and Seldinger technique used Catheter size: 9 Fr Total catheter length 8. PA cath was placed.Sheath introducer Swan type:thermodilution PA Cath depth:50 Procedure performed using ultrasound guided technique. Ultrasound Notes:anatomy identified, needle tip was noted to be adjacent to the nerve/plexus identified, no ultrasound evidence of intravascular and/or intraneural injection and image(s) printed for medical record Attempts: 1 Following insertion, line sutured, dressing applied and Biopatch. Post procedure assessment: free fluid flow, blood return through all ports and no air  Patient tolerated the procedure well with no immediate complications.

## 2018-05-30 NOTE — Progress Notes (Signed)
Emergency consent for cardiac catheterization.   Samantha [name] 05/30/2018 2:56 PM

## 2018-05-30 NOTE — Anesthesia Procedure Notes (Signed)
Procedure Name: Intubation Date/Time: 05/30/2018 1:14 PM Performed by: Lance Coon, CRNA Pre-anesthesia Checklist: Patient identified, Emergency Drugs available, Suction available and Patient being monitored Patient Re-evaluated:Patient Re-evaluated prior to induction Oxygen Delivery Method: Circle system utilized Preoxygenation: Pre-oxygenation with 100% oxygen Induction Type: IV induction and Rapid sequence Laryngoscope Size: Mac and 4 Grade View: Grade I Tube type: Oral Tube size: 7.5 mm Number of attempts: 1 Airway Equipment and Method: Stylet Placement Confirmation: ETT inserted through vocal cords under direct vision,  positive ETCO2 and breath sounds checked- equal and bilateral Secured at: 21 cm Tube secured with: Tape Dental Injury: Teeth and Oropharynx as per pre-operative assessment

## 2018-05-30 NOTE — ED Notes (Signed)
ACTIVATED CODE STEMI WITH CASSIE  @CARELINK 

## 2018-05-30 NOTE — Consult Note (Signed)
NAME:  SHAUNNA ROSETTI, MRN:  [id number], DOB:  [birth year], LOS: 0 ADMISSION DATE:  05/30/2018, CONSULTATION DATE: 05/30/2018 REFERRING MD: Angelena Form, CHIEF COMPLAINT:  Chest and abdominal pain   Brief History   Patient presented this afternoon with chest and abdominal pain found to have RCA lesion after ascending aortic dissection was ruled out. History of present illness   Patient is a 58 year old female with a history of lupus, hypertension, tobacco abuse presented with complaints of chest and abdominal pain.  She was profoundly hypotensive on arrival.  Noted to have significant abdominal pain.  She did have some degree of ST segment elevation in aVR with depression in the lateral and anterior lateral leads.  CTA of the chest was performed which showed no evidence of viscus perforation but did show some wall thickening in comparison to a similar CT scan performed in early February.  This was initially felt consistent with type a intramural hematoma.  She incidentally was noted to have radiographic evidence of emphysema.  Patient was taken to the operating room with TEE performed showing no evidence of dissection but probable hematoma around the aortic arch.  Patient was intubated prior to TEE.  TEE did show evidence of acute inferior MI.  He also was found to have by cardiac catheterization thrombotic lesion of the RCA which subsequently was stented.  On my evaluation the patient is intubated sedated.  Cardiac output is 3.6-3.9.  PA pressure is 32/22.  He is on 2 mics of Levophed with systemic pressure of 110/75.  Arterial blood gases pending at time of dictation.  Troponin was greater than 60.  Admission creatinine is slightly up from baseline 3 months ago to 1.48.  Anion gap is 16.  Past Medical History  As noted in HPI  Significant Hospital Events   Admission, intubation, TEE, CTA of the chest abdomen, cardiac catheterization with stenting of RCA 05/30/2018  Consults:  Cardiovascular surgery   Procedures:  Intubation, central line, cardiac catheterization, TEE  Significant Diagnostic Tests:  As above  Micro Data:  Endings  Antimicrobials:  None  Interim history/subjective:  NA  Objective   Blood pressure (!) 113/96, pulse 69, temperature (!) 97.2 F (36.2 C), resp. rate 16, height 5\' 8"  (1.727 m), weight 72.6 kg, last menstrual period 04/11/2011, SpO2 100 %. PAP: (26-30)/(16-20) 28/17 CVP:  [13 mmHg-15 mmHg] 14 mmHg  Vent Mode: PRVC FiO2 (%):  [100 %] 100 % Set Rate:  [16 bmp] 16 bmp Vt Set:  [510 mL] 510 mL PEEP:  [5 cmH20] 5 cmH20 Plateau Pressure:  [30 cmH20] 30 cmH20   Intake/Output Summary (Last 24 hours) at 05/30/2018 1933 Last data filed at 05/30/2018 1800 Gross per 24 hour  Intake 1681.87 ml  Output 1200 ml  Net 481.87 ml   Filed Weights   05/30/18 1003  Weight: 72.6 kg    Examination: General: Ill-appearing black female intubated sedated on mechanical ventilation HENT: Limits Lungs: Coarse rhonchi Cardiovascular: Irregular Abdomen: Patient sedated but seems benign bowel sounds are hypoactive Extremities: Within normal limits Neuro: Sedated GU: N/A  Resolved Hospital Problem list   NA  Assessment & Plan:  Post procedure respiratory failure: We will assist with mechanical ventilation issues while in the ICU.  Will obtain arterial blood gas with appropriate ventilator changes this evening.  We will also obtain chest x-ray for tube and line placement this evening.  Post RCA stent: Patient did have elevated troponins.  Does appear to have an aortic hematoma without dissection.  Blood pressure currently supported with Levophed at 2 mics.  Acute kidney injury: We will require close monitoring with daily BMP with creatinine doubled from baseline 2 months ago  Hypotension: Current therapy.  Anion gap acidosis: We will continue current therapy and reevaluate in the morning  History of lupus  Radiographic evidence of mild emphysema  Anemia:  Monitor globin currently 10      Best practice:  Diet: N.p.o. Pain/Anxiety/Delirium protocol (if indicated): Currently adequately sedated on Precedex, receiving intermittent Versed and fentanyl boluses VAP protocol (if indicated): Yes DVT prophylaxis: On anticoagulation currently on SCD GI prophylaxis: We will start Pepcid Glucose control: We will monitor Mobility: Bedrest Code Status: Full Family Communication: None Disposition:   Labs   CBC: Recent Labs  Lab 05/30/18 1010  WBC 6.1  HGB 11.4*  HCT 36.8  MCV 92.0  PLT 235    Basic Metabolic Panel: Recent Labs  Lab 05/30/18 1011  NA 132*  K 3.5  CL 100  CO2 16*  GLUCOSE 112*  BUN 21*  CREATININE 1.48*  CALCIUM 8.6*  MG 2.0   GFR: Estimated Creatinine Clearance: 42.8 mL/min (A) (by C-G formula based on SCr of 1.48 mg/dL (H)). Recent Labs  Lab 05/30/18 1010 05/30/18 1011  WBC 6.1  --   LATICACIDVEN  --  2.0*    Liver Function Tests: Recent Labs  Lab 05/30/18 1107  AST 425*  ALT 76*  ALKPHOS 90  BILITOT 0.7  PROT 6.5  ALBUMIN 3.2*   Recent Labs  Lab 05/30/18 1107  LIPASE 27   No results for input(s): AMMONIA in the last 168 hours.  ABG    Component Value Date/Time   PHART 7.514 (H) 03/10/2011 0050   PCO2ART 28.4 (L) 03/10/2011 0050   PO2ART 84.6 03/10/2011 0050   HCO3 22.7 03/10/2011 0050   TCO2 20.2 03/10/2011 0050   O2SAT 96.5 03/10/2011 0050     Coagulation Profile: Recent Labs  Lab 05/30/18 1011  INR 1.1    Cardiac Enzymes: Recent Labs  Lab 05/30/18 1011 05/30/18 1719  TROPONINI >65.00* >65.00*    HbA1C: Hgb A1c MFr Bld  Date/Time Value Ref Range Status  03/13/2011 05:14 AM 5.6 <5.7 % Final    Comment:    (NOTE)                                                                       According to the ADA Clinical Practice Recommendations for 2011, when HbA1c is used as a screening test:  >=6.5%   Diagnostic of Diabetes Mellitus           (if abnormal result is  confirmed) 5.7-6.4%   Increased risk of developing Diabetes Mellitus References:Diagnosis and Classification of Diabetes Mellitus,Diabetes TDDU,2025,42(HCWCB 1):S62-S69 and Standards of Medical Care in         Diabetes - 2011,Diabetes JSEG,3151,76 (Suppl 1):S11-S61.    CBG: No results for input(s): GLUCAP in the last 168 hours.  Review of Systems:   Patient sedated on mechanical ventilation history obtained from nurses and medical record  Past Medical History  She,  has a past medical history of Altered mental status, Backache, unspecified, Gastric ulcer, unspecified as acute or chronic, without mention of hemorrhage, perforation, or obstruction, Helicobacter  pylori (H. pylori), Lupus ([place]), Tobacco abuse, and Unspecified essential hypertension.   Surgical History    Past Surgical History:  Procedure Laterality Date  . UPPER GASTROINTESTINAL ENDOSCOPY  2013     Social History   reports that she has been smoking cigarettes. She has a 30.00 pack-year smoking history. She has never used smokeless tobacco. She reports current alcohol use. She reports that she does not use drugs.   Family History   Her family history includes Diabetes in her sister; Hypertension in her mother. There is no history of Colon cancer or Stomach cancer.   Allergies Allergies  Allergen Reactions  . Tramadol Nausea Only    Feels weirded out     Home Medications  Prior to Admission medications   Medication Sig Start Date End Date Taking? Authorizing Provider  acetaminophen (TYLENOL) 500 MG tablet Take 500 mg by mouth every 6 (six) hours as needed for moderate pain or headache.   Yes [provider]  aspirin EC 81 MG tablet Take 1 tablet (81 mg total) by mouth daily. 08/05/16  Yes Roma Schanz R, DO  cyclobenzaprine (FLEXERIL) 10 MG tablet TAKE 1 TABLET(10 MG) BY MOUTH THREE TIMES DAILY AS NEEDED Patient taking differently: Take 10 mg by mouth 3 (three) times daily as needed for muscle  spasms.  01/10/17  Yes Roma Schanz R, DO  losartan (COZAAR) 100 MG tablet Take 1 tablet (100 mg total) by mouth daily. 11/28/17  Yes Roma Schanz R, DO  metoprolol succinate (TOPROL-XL) 100 MG 24 hr tablet TAKE 1 TABLET BY MOUTH DAILY WITH OR IMMEDIATELY FOLLOWING A MEAL Patient taking differently: Take 100 mg by mouth daily. TAKE 1 TABLET BY MOUTH DAILY WITH OR IMMEDIATELY FOLLOWING A MEAL 03/16/18  Yes Roma Schanz R, DO  Multiple Vitamin (MULTIVITAMIN) tablet Take 1 tablet by mouth daily.   Yes [provider]  mycophenolate (CELLCEPT) 500 MG tablet Take 1,500 mg by mouth 2 (two) times daily.    Yes [provider]  nicotine (NICOTROL) 10 MG inhaler Inhale 1 Cartridge (1 continuous puffing total) into the lungs as needed for smoking cessation. 02/17/18  Yes Roma Schanz R, DO  pantoprazole (PROTONIX) 20 MG tablet Take 1 tablet (20 mg total) by mouth daily. 03/26/18  Yes Bast, Traci A, NP  dexlansoprazole (DEXILANT) 60 MG capsule Take 1 capsule (60 mg total) by mouth daily. Patient not taking: Reported on 05/30/2018 08/12/17   [name], DO     Critical care time: Over 35 minutes was spent in examination, chart evaluation and decision making.

## 2018-05-30 NOTE — Anesthesia Procedure Notes (Signed)
Arterial Line Insertion Start/End5/02/2018 12:55 PM, 05/30/2018 1:06 PM Performed by: Lance Coon, CRNA, CRNA  Patient location: OR. Preanesthetic checklist: patient identified, IV checked, site marked, risks and benefits discussed, surgical consent, monitors and equipment checked, pre-op evaluation and anesthesia consent Patient sedated Left, radial was placed Catheter size: 20 G Hand hygiene performed , maximum sterile barriers used  and Seldinger technique used Allen's test indicative of satisfactory collateral circulation Attempts: 1 Procedure performed without using ultrasound guided technique. Ultrasound Notes:anatomy identified, needle tip was noted to be adjacent to the nerve/plexus identified and no ultrasound evidence of intravascular and/or intraneural injection Following insertion, dressing applied and Biopatch. Post procedure assessment: normal  Patient tolerated the procedure well with no immediate complications.

## 2018-05-30 NOTE — ED Notes (Signed)
Pt transported to cath lab, upon arrival to cath lab MD notified RN of abnormal CT results and instructed to return to emergency department. Charge nurse aware.

## 2018-05-30 NOTE — Consult Note (Signed)
Responded to page mid-afternoon to OR for pt, but when arrived, nurse said no family was present. Checked with doctor, who said he'd talked to pt's sister. asked her if she were coming, she'd said no. So he will call the sister to let her know results when pt is done with surgery. At that time pt had not yet gone in. He will page for chaplain if any family arrives. Standing by.  Rev. Eloise Levels Chaplain

## 2018-05-30 NOTE — Progress Notes (Signed)
Pt transported to ICU from cath lab via vent w/ no apparent complications.  Sat not picking up, CRNA reports sat not picking up in OR earlier but reports PaO2 satisfactory on 100% fio2.  Pt placed on vent VT 8 ml/kg, rate 16 until CCM can eval pt and place orders.  Unit RT aware/report given.

## 2018-05-30 NOTE — ED Notes (Signed)
Cardiology at bedside.

## 2018-05-30 NOTE — Transfer of Care (Signed)
Immediate Anesthesia Transfer of Care Note  Patient: Samantha [name]  Procedure(s) Performed: REPAIR OF ACUTE ASCENDING THORACIC AORTIC DISSECTION (N/A )  Patient Location: Cath Lab  Anesthesia Type:General  Level of Consciousness: sedated, patient cooperative and Patient remains intubated per anesthesia plan  Airway & Oxygen Therapy: Patient remains intubated per anesthesia plan and Patient placed on Ventilator (see vital sign flow sheet for setting)  Post-op Assessment: Report given to RN and Post -op Vital signs reviewed and stable  Post vital signs: Reviewed and stable  Last Vitals:  Vitals Value Taken Time  BP    Temp    Pulse    Resp    SpO2      Last Pain:  Vitals:   05/30/18 1219  TempSrc:   PainSc: 10-Worst pain ever         Complications: No apparent anesthesia complications

## 2018-05-30 NOTE — Progress Notes (Signed)
[address]       [place] [address]             [phone]        Saesha R Phang [place] Medical Record #[id number] Date of Birth: [birth year]  Referring: No ref. provider found Primary Care: Carollee Herter, Alferd Apa, DO Primary Cardiologist:No primary care provider on file.  Chief Complaint:    Chief Complaint  Patient presents with  . Chest Pain    History of Present Illness:     Asked to urgently see patient 58 year old with history of hypertension and lupus.  Patient noted onset of anterior chest pain radiating to the back and upper abdomen starting about 7 PM last night.  She notes that it progressively got worse today when she came to the emergency room earlier this morning.  Troponins were noted to be elevated to over 60, patient was originally originally treated as a STEMI and was taken to the Cath Lab a CT of the chest to rule out dissection ultimately suggested a peri-aortic hematoma involving the ascending aorta, and possible dissection proximally noted on the sagittal views.  There is no distinct flap noted through the ascending aorta or into the arch.  The patient is a long-term smoker and has had previous CT scans to rule out carcinoma, she has no previous history of dilated ascending aorta.  She has been on CellCept for lupus. Initially presented to the emergency room with blood pressure in the 60s was started on Levophed drip. The patient denies any respiratory symptoms or fever or chills.  Current Activity/ Functional Status: Patient is independent with mobility/ambulation, transfers, ADL's, IADL's.   Zubrod Score: At the time of surgery this patient's most appropriate activity status/level should be described as: []     0    Normal activity, no symptoms [x]     1    Restricted in physical strenuous activity but ambulatory, able to do out light work []     2    Ambulatory and capable of self care, unable to do work activities, up and about                  more than 50%  Of the time                            []     3    Only limited self care, in bed greater than 50% of waking hours []     4    Completely disabled, no self care, confined to bed or chair []     5    Moribund  Past Medical History:  Diagnosis Date  . Altered mental status   . Backache, unspecified   . Gastric ulcer, unspecified as acute or chronic, without mention of hemorrhage, perforation, or obstruction   . Helicobacter pylori (H. pylori)   . Lupus ([place])   . Tobacco abuse   . Unspecified essential hypertension     Past Surgical History:  Procedure Laterality Date  . UPPER GASTROINTESTINAL ENDOSCOPY  2013    Social History   Tobacco Use  Smoking Status Current Every Day Smoker  . Packs/day: 0.75  . Years: 40.00  . Pack years: 30.00  . Types: Cigarettes  Smokeless Tobacco Never Used  Tobacco Comment   1 cig a day    Social History   Substance and Sexual Activity  Alcohol Use Yes  Comment: occassional; beer      Allergies  Allergen Reactions  . Tramadol Nausea Only    Feels weirded out    Current Facility-Administered Medications  Medication Dose Route Frequency Provider Last Rate Last Dose  . 0.9 %  sodium chloride infusion   Intravenous Continuous Virgel Manifold, MD 20 mL/hr at 05/30/18 1026    . cefUROXime (ZINACEF) 1.5 g in sodium chloride 0.9 % 100 mL IVPB  1.5 g Intravenous To OR Grace Isaac, MD      . cefUROXime (ZINACEF) 750 mg in sodium chloride 0.9 % 100 mL IVPB  750 mg Intravenous To OR Grace Isaac, MD      . dexmedetomidine (PRECEDEX) 400 MCG/100ML (4 mcg/mL) infusion  0.1-0.7 mcg/kg/hr Intravenous To OR Grace Isaac, MD      . DOPamine (INTROPIN) 800 mg in dextrose 5 % 250 mL (3.2 mg/mL) infusion  0-10 mcg/kg/min Intravenous To OR Grace Isaac, MD      . EPINEPHrine (ADRENALIN) 4 mg in dextrose 5 % 250 mL (0.016 mg/mL) infusion  0-10 mcg/min Intravenous To OR Grace Isaac, MD      . heparin  2,500 Units, papaverine 30 mg in electrolyte-148 (PLASMALYTE-148) 500 mL irrigation   Irrigation To OR Grace Isaac, MD      . heparin 30,000 units/NS 1000 mL solution for CELLSAVER   Other To OR Grace Isaac, MD      . insulin regular, human (MYXREDLIN) 100 units/ 100 mL infusion   Intravenous To OR Grace Isaac, MD      . Burgess Amor Blood Cardioplegia [facility]) lidocaine 2% Syringe (35mL)  13 mL Intracoronary To OR Grace Isaac, MD      . Burgess Amor Blood Cardioplegia [facility]) lidocaine 2% Syringe (67mL)  13 mL Intracoronary To OR Grace Isaac, MD      . Burgess Amor Blood Cardioplegia (KBC) mannitol 20% Syringe (61mL)  32 mL Intracoronary To OR Grace Isaac, MD      . Burgess Amor Blood Cardioplegia (KBC) mannitol 20% Syringe (39mL)  32 mL Intracoronary To OR Grace Isaac, MD      . magnesium sulfate (IV Push/IM) injection 40 mEq  40 mEq Other To OR Grace Isaac, MD      . milrinone (PRIMACOR) 20 MG/100 ML (0.2 mg/mL) infusion  0.3 mcg/kg/min Intravenous To OR Grace Isaac, MD      . nitroGLYCERIN 50 mg in dextrose 5 % 250 mL (0.2 mg/mL) infusion  2-200 mcg/min Intravenous To OR Grace Isaac, MD      . norepinephrine (LEVOPHED) 4mg  in 259mL premix infusion  0-40 mcg/min Intravenous Continuous Virgel Manifold, MD 7.5 mL/hr at 05/30/18 1159 2 mcg/min at 05/30/18 1159  . norepinephrine (LEVOPHED) 4mg  in 261mL premix infusion  0-40 mcg/min Intravenous To OR Grace Isaac, MD      . phenylephrine (NEOSYNEPHRINE) 20-0.9 MG/250ML-% infusion  30-200 mcg/min Intravenous To OR Grace Isaac, MD      . potassium chloride injection 80 mEq  80 mEq Other To OR Grace Isaac, MD      . tranexamic acid (CYKLOKAPRON) 2,500 mg in sodium chloride 0.9 % 250 mL (10 mg/mL) infusion  1.5 mg/kg/hr Intravenous To OR Grace Isaac, MD      . tranexamic acid (CYKLOKAPRON) bolus via infusion - over 30 minutes 1,089 mg  15 mg/kg Intravenous To OR Grace Isaac, MD      . tranexamic acid (  CYKLOKAPRON) pump prime solution 145 mg  2 mg/kg Intracatheter To OR Grace Isaac, MD      . vancomycin (VANCOCIN) 1,250 mg in sodium chloride 0.9 % 250 mL IVPB  1,250 mg Intravenous To OR Grace Isaac, MD       Current Outpatient Medications  Medication Sig Dispense Refill  . acetaminophen (TYLENOL) 500 MG tablet Take 500 mg by mouth every 6 (six) hours as needed for moderate pain or headache.    Marland Kitchen aspirin EC 81 MG tablet Take 1 tablet (81 mg total) by mouth daily.    . cyclobenzaprine (FLEXERIL) 10 MG tablet TAKE 1 TABLET(10 MG) BY MOUTH THREE TIMES DAILY AS NEEDED (Patient taking differently: Take 10 mg by mouth 3 (three) times daily as needed for muscle spasms. ) 30 tablet 0  . losartan (COZAAR) 100 MG tablet Take 1 tablet (100 mg total) by mouth daily. 90 tablet 1  . metoprolol succinate (TOPROL-XL) 100 MG 24 hr tablet TAKE 1 TABLET BY MOUTH DAILY WITH OR IMMEDIATELY FOLLOWING A MEAL (Patient taking differently: Take 100 mg by mouth daily. TAKE 1 TABLET BY MOUTH DAILY WITH OR IMMEDIATELY FOLLOWING A MEAL) 90 tablet 1  . Multiple Vitamin (MULTIVITAMIN) tablet Take 1 tablet by mouth daily.    . mycophenolate (CELLCEPT) 500 MG tablet Take 1,500 mg by mouth 2 (two) times daily.     . nicotine (NICOTROL) 10 MG inhaler Inhale 1 Cartridge (1 continuous puffing total) into the lungs as needed for smoking cessation. 42 each 0  . pantoprazole (PROTONIX) 20 MG tablet Take 1 tablet (20 mg total) by mouth daily. 30 tablet 1  . dexlansoprazole (DEXILANT) 60 MG capsule Take 1 capsule (60 mg total) by mouth daily. (Patient not taking: Reported on 05/30/2018) 90 capsule 3     Family History  Problem Relation Age of Onset  . Diabetes Sister   . Hypertension Mother   . Colon cancer Neg Hx   . Stomach cancer Neg Hx    No family history of dissection  Review of Systems:   Pertinent items are noted in HPI.     Cardiac Review of Systems: Y or  [    ]= no   Chest Pain [ y   ]  Resting SOB [ n  ] Exertional SOB  [ n ]  Orthopnea [ n ]   Pedal Edema [ n  ]    Palpitations [n  ] Syncope  [ n ]   Presyncope [   ]n  General Review of Systems: [Y] = yes [  ]=no Constitional: recent weight change [  ]; anorexia [  ]; fatigue [  ]; nausea [  ]; night sweats [  ]; fever [  ]; or chills [  ]                                                               Dental: Last Dentist visit:   Eye : blurred vision [  ]; diplopia [   ]; vision changes [  ];  Amaurosis fugax[  ]; Resp: cough [  ];  wheezing[  ];  hemoptysis[  ]; shortness of breath[  ]; paroxysmal nocturnal dyspnea[  ]; dyspnea on exertion[  ]; or orthopnea[  ];  GI:  gallstones[  ], vomiting[  ];  dysphagia[  ]; melena[  ];  hematochezia [  ]; heartburn[  ];   Hx of  Colonoscopy[  ]; GU: kidney stones [  ]; hematuria[  ];   dysuria [  ];  nocturia[  ];  history of     obstruction [  ]; urinary frequency [  ]             Skin: rash, swelling[  ];, hair loss[  ];  peripheral edema[  ];  or itching[  ]; Musculosketetal: myalgias[  ];  joint swelling[  ];  joint erythema[  ];  joint pain[  ];  back pain[  ];  Heme/Lymph: bruising[  ];  bleeding[  ];  anemia[  ];  Neuro: TIA[  ];  headaches[  ];  stroke[  ];  vertigo[  ];  seizures[  ];   paresthesias[  ];  difficulty walking[  ];  Psych:depression[  ]; anxiety[  ];  Endocrine: diabetes[  ];  thyroid dysfunction[  ];              Physical Exam: BP 115/78   Pulse 69   Temp 98.1 F (36.7 C) (Oral)   Resp (!) 23   Ht 5\' 8"  (1.727 m)   Wt 72.6 kg   LMP 04/11/2011   SpO2 100%   BMI 24.33 kg/m    General appearance: alert, cooperative, appears older than stated age and moderate distress Head: Normocephalic, without obvious abnormality, atraumatic Neck: no adenopathy, no carotid bruit, no JVD, supple, symmetrical, trachea midline and thyroid not enlarged, symmetric, no tenderness/mass/nodules Lymph nodes: Cervical, supraclavicular, and axillary nodes  normal. Resp: diminished breath sounds bibasilar Back: symmetric, no curvature. ROM normal. No CVA tenderness. Cardio: regular rate and rhythm, S1, S2 normal, no murmur, click, rub or gallop and no m of AI GI: soft, non-tender; bowel sounds normal; no masses,  no organomegaly and mild abdominal tenderness but complains of lower chest pain more then abdomen Extremities: extremities normal, atraumatic, no cyanosis or edema, Homans sign is negative, no sign of DVT and pedal pulses palpable at ankes but weak bilaterial Neurologic: Grossly normal  Diagnostic Studies & Laboratory data:     Recent Radiology Findings:   Dg Chest Portable 1 View  Result Date: 05/30/2018 CLINICAL DATA:  Chest pain EXAM: PORTABLE CHEST 1 VIEW COMPARISON:  01/12/2018 chest radiograph. FINDINGS: Stable cardiomediastinal silhouette with top-normal heart size. No pneumothorax. No pleural effusion. Lungs appear clear, with no acute consolidative airspace disease and no pulmonary edema. IMPRESSION: No active disease. Electronically Signed   By: Ilona Sorrel M.D.   On: 05/30/2018 10:48   Ct Angio Chest/abd/pel For Dissection W And/or Wo Contrast  Result Date: 05/30/2018 CLINICAL DATA:  Severe chest pain radiating into the back with nausea and vomiting. Hypotension. EXAM: CT ANGIOGRAPHY CHEST, ABDOMEN AND PELVIS TECHNIQUE: Multidetector CT imaging through the chest, abdomen and pelvis was performed using the standard protocol during bolus administration of intravenous contrast. Multiplanar reconstructed images and MIPs were obtained and reviewed to evaluate the vascular anatomy. CONTRAST:  156mL OMNIPAQUE IOHEXOL 350 MG/ML SOLN COMPARISON:  Chest radiograph from earlier today. 03/06/2018 screening chest CT. 12/13/2015 CT abdomen/pelvis. FINDINGS: CTA CHEST FINDINGS Cardiovascular: Mild cardiomegaly. No significant pericardial effusion/thickening. Left anterior descending and right coronary atherosclerosis. Atherosclerotic nonaneurysmal  thoracic aorta. There is new wall thickening in the ascending thoracic aorta compared to 03/06/2018 screening chest CT study, which extends to the junction with the  aortic arch and which measures complex fluid density, which may indicate acute intramural hematoma. No dissection flap within the thoracic aorta. No pseudoaneurysm. No penetrating atherosclerotic ulcer. Aortic arch branch vessels are patent. Normal caliber pulmonary arteries. No central pulmonary emboli. Mediastinum/Nodes: No discrete thyroid nodules. Unremarkable esophagus. No pathologically enlarged axillary, mediastinal or hilar lymph nodes. Lungs/Pleura: No pneumothorax. No pleural effusion. Mild centrilobular and paraseptal emphysema with mild diffuse bronchial wall thickening. Low-attenuation material obstructs the left lower lobe bronchus. Subsegmental scarring versus atelectasis in the left lower lobe. No acute consolidative airspace disease, lung masses or significant pulmonary nodules. Musculoskeletal: No aggressive appearing focal osseous lesions. Mild thoracic spondylosis. Review of the MIP images confirms the above findings. CTA ABDOMEN AND PELVIS FINDINGS VASCULAR Aorta: Normal caliber aorta without aneurysm, dissection, vasculitis or significant stenosis. Celiac: Patent without evidence of aneurysm, dissection, vasculitis or significant stenosis. SMA: Patent without evidence of aneurysm, dissection, vasculitis or significant stenosis. Renals: Both renal arteries are patent without evidence of aneurysm, dissection, vasculitis, fibromuscular dysplasia or significant stenosis. IMA: Patent without evidence of aneurysm, dissection, vasculitis or significant stenosis. Inflow: Patent without evidence of aneurysm, dissection, vasculitis or significant stenosis. Veins: No obvious venous abnormality within the limitations of this arterial phase study. Review of the MIP images confirms the above findings. NON-VASCULAR Hepatobiliary: Normal liver with  no liver mass. Normal gallbladder with no radiopaque cholelithiasis. No biliary ductal dilatation. Pancreas: Normal, with no mass or duct dilation. Spleen: Normal size. No mass. Adrenals/Urinary Tract: Normal adrenals. Simple 8.6 cm anterior lower right renal cyst. No hydronephrosis. Normal bladder. Stomach/Bowel: Normal non-distended stomach. Normal caliber small bowel with no small bowel wall thickening. Normal appendix. Normal large bowel with no diverticulosis, large bowel wall thickening or pericolonic fat stranding. Vascular/Lymphatic: Atherosclerotic nonaneurysmal abdominal aorta. No pathologically enlarged lymph nodes in the abdomen or pelvis. Reproductive: Mildly enlarged myomatous uterus with calcified uterine fibroids. No adnexal masses. Other: No pneumoperitoneum, ascites or focal fluid collection. Musculoskeletal: No aggressive appearing focal osseous lesions. Moderate lower lumbar spondylosis. Review of the MIP images confirms the above findings. IMPRESSION: 1. New wall thickening in the ascending thoracic aorta compared to 03/06/2018 screening chest CT study, measuring complex fluid density, favored to represent acute aortic syndrome due to type A acute intramural hematoma. No visible dissection flap. No aneurysm or pseudoaneurysm. 2. Mild cardiomegaly.  Two-vessel coronary atherosclerosis. 3. Mild centrilobular and paraseptal emphysema with mild diffuse bronchial wall thickening, suggesting COPD. 4. Mildly enlarged calcified myomatous uterus. 5. Aortic Atherosclerosis (ICD10-I70.0) and Emphysema (ICD10-J43.9). Critical Value/emergent results were called by telephone at the time of interpretation on 05/30/2018 at 11:36 am to Dr. Virgel Manifold and to Dr. Serita Sheller in the cath lab, who verbally acknowledged these results. Electronically Signed   By: Ilona Sorrel M.D.   On: 05/30/2018 11:42     I have independently reviewed the above radiologic studies and discussed with the patient   Recent Lab  Findings: Lab Results  Component Value Date   WBC 6.1 05/30/2018   HGB 11.4 (L) 05/30/2018   HCT 36.8 05/30/2018   PLT 158 05/30/2018   GLUCOSE 112 (H) 05/30/2018   CHOL 168 05/30/2018   TRIG 99 05/30/2018   HDL 64 05/30/2018   LDLDIRECT 141.8 09/15/2012   LDLCALC NOT CALCULATED 05/30/2018   ALT 76 (H) 05/30/2018   AST 425 (H) 05/30/2018   NA 132 (L) 05/30/2018   K 3.5 05/30/2018   CL 100 05/30/2018   CREATININE 1.48 (H) 05/30/2018   BUN 21 (H) 05/30/2018  CO2 16 (L) 05/30/2018   TSH 0.83 09/15/2012   INR 1.1 05/30/2018   HGBA1C 5.6 03/13/2011   Lab Results  Component Value Date   CKTOTAL 34 04/29/2011   TROPONINI >65.00 (HH) 05/30/2018   Chronic Kidney Disease   Stage I     GFR >90  Stage II    GFR 60-89  Stage IIIA GFR 45-59  Stage IIIB GFR 30-44  Stage IV   GFR 15-29  Stage V    GFR  <15  Lab Results  Component Value Date   CREATININE 1.48 (H) 05/30/2018   Estimated Creatinine Clearance: 42.8 mL/min (A) (by C-G formula based on SCr of 1.48 mg/dL (H)).   Assessment / Plan:    1/discussed with Cardiology Dr. 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 Poss involvement of the right coronary  2/history of lupus 3/renal insufficiency stage III 4/acute myocardial ischemia with markedly elevated troponin, with cardiogenic shock hypotension requiring pressors  The patient is neurologically intact able to give some details of her history, we also called her Sister Silva Bandy.  Recommend to the patient that we proceed emergently to the OR with general anesthesia placed TEE, evaluate the aortic valve and a sending aorta and likely proceed with repair of equivalent of aortic dissection possible coronary artery bypass grafting depending on findings  Patient understands the high risk nature of recommended procedure with her, acute aortic problem, acute myocardial  infarction and underlying renal insufficiency.   Grace Isaac MD      [address] [place] [address] Office [phone]   Beeper [phone]  05/30/2018 12:56 PM     Patient ID: PERIAN TEDDER, female   DOB: [birth year], 58 y.o.   MRN: [id number]

## 2018-05-30 NOTE — Consult Note (Signed)
Responded to page from ED for incoming STEMI. Pt could not yet be seen, & no family was with her, so asked to stand by. Responded to 2nd ED page when pt was in room, just having talked with doctor and now awaiting imminent surgery. Offered spiritual comfort and prayer. Pt said she had no particular church & believed in [place]. Let pt know she could ask nurse to call a chaplain at any time after surgery if she wished. Nurse had just spoken to pt's sister, who would get in touch with pt's daughter.  Rev. Eloise Levels Chaplain

## 2018-05-30 NOTE — H&P (Signed)
CardiologyConsultation   Patient YY:TKPTWS R Barsch FKC:[id number]; DOB:July 18, 1960  Admission date:05/30/2018  Primary Care Provider:Lowne Koren Shiver, DO Primary Cardiologist:No primary care provider on file. Primary Electrophysiologist:None  Chief Complaint:Chest pain/abdominal pain   History of Present Illness:   Ms.Robinsonis a 58 yo female with history of systemic lupus, HTN and tobacco abuse who presented to the ED this am with c/o chest pain and abdominal pain since last night. Her pain has been present for at least 12 hours. Her initial systolic BP on arrival to the ED was in the 60s. She was very tender to palpation in her abdomen. EKG with slight ST elevation in AVR and lead 3 with ST depression in the lateral and anterolateral leads. Code STEMI called by Dr. Wilson Singer. Ongoing chest pain and abdominal pain in the ED with abdominal tenderness. Levophed was started in the ED. Upon my arrival to the ED, Dr. Wilson Singer and I discussed her unusual presentation for acute MI and elected to perform a STAT CTA of the chest and abdomen to exclude aortic dissection or perforation in the abdomen. The CTA was initially felt to be negative for dissection or free air in the abdomen. Just as her CTA was interpreted as negative for an acute process, her troponin returned at >65. She was taken to the cath lab for emergent cardiac cath. BP 120/75 on arrival to the cath lab.As we were loading her onto the cath table, we received a call from the radiologist Dr. Polly Cobia that the patient had evidence of hematoma in the ascending aorta wall. This likely represents an acute aortic syndrome due to dissection. The patient was taken back to the ED for further care. I personally spoke to Dr. Wilson Singer from the ED and Dr. Servando Snare who is on call for CT surgery today.   The patient was taken to the OR and intraoperative TEE did not show evidence of a aortic dissection or flap. Surface echo with  inferior wall hypokinesis. Images reviewed with our on call cardiac imaging team. Case reviewed with Dr. Servando Snare. Pt will be brought to the cath lab for emergent cardiac catheterization  Of note, cath delayed due to CTA imaging results and possible acute aortic syndrome as above.   The patient was moved from the OR to the cath lab. She is intubated at this time.         Past Medical History:  Diagnosis Date  . Altered mental status   . Backache, unspecified   . Gastric ulcer, unspecified as acute or chronic, without mention of hemorrhage, perforation, or obstruction   . Helicobacter pylori (H. pylori)   . Lupus ([place])   . Tobacco abuse   . Unspecified essential hypertension          Past Surgical History:  Procedure Laterality Date  . UPPER GASTROINTESTINAL ENDOSCOPY  2013    Medications Prior to Admission:        Prior to Admission medications   Medication Sig Start Date End Date Taking? Authorizing Provider  aspirin EC 81 MG tablet Take 1 tablet (81 mg total) by mouth daily. 08/05/16   Roma Schanz R, DO  cyclobenzaprine (FLEXERIL) 10 MG tablet TAKE 1 TABLET(10 MG) BY MOUTH THREE TIMES DAILY AS NEEDED 01/10/17   Carollee Herter, Alferd Apa, DO  dexlansoprazole (DEXILANT) 60 MG capsule Take 1 capsule (60 mg total) by mouth daily. 08/12/17   [name], DO  hydroxychloroquine (PLAQUENIL) 200 MG tablet Take 200 mg by mouth 2 (  two) times daily.    [provider]  losartan (COZAAR) 100 MG tablet Take 1 tablet (100 mg total) by mouth daily. 11/28/17   Roma Schanz R, DO  metoprolol succinate (TOPROL-XL) 100 MG 24 hr tablet TAKE 1 TABLET BY MOUTH DAILY WITH OR IMMEDIATELY FOLLOWING A MEAL 03/16/18   [name], DO  Multiple Vitamin (MULTIVITAMIN) tablet Take 1 tablet by mouth daily.    [provider]  mycophenolate (CELLCEPT) 500 MG tablet Take 1,500 mg by mouth 2 (two) times daily.     [provider]  nicotine (NICOTROL) 10 MG inhaler Inhale 1 Cartridge (1 continuous puffing total) into the lungs as needed for smoking cessation. 02/17/18   Roma Schanz R, DO  pantoprazole (PROTONIX) 20 MG tablet Take 1 tablet (20 mg total) by mouth daily. 03/26/18   Orvan July, NP    Allergies:      Allergies  Allergen Reactions  . Tramadol Nausea Only    Feels weirded out    Social History: Social History        Socioeconomic History  . Marital status: Single    Spouse name: Not on file  . Number of children: Not on file  . Years of education: Not on file  . Highest education level: Not on file  Occupational History  . Occupation: permanant disability    Employer: [employer]  Social Needs  . Financial resource strain: Not on file  . Food insecurity:    Worry: Not on file    Inability: Not on file  . Transportation needs:    Medical: Not on file    Non-medical: Not on file  Tobacco Use  . Smoking status: Current Every Day Smoker    Packs/day: 0.75    Years: 40.00    Pack years: 30.00    Types: Cigarettes  . Smokeless tobacco: Never Used  . Tobacco comment: 1 cig a day  Substance and Sexual Activity  . Alcohol use: Yes    Comment: occassional; beer   . Drug use: No  . Sexual activity: Not Currently    Partners: Male  Lifestyle  . Physical activity:    Days per week: Not on file    Minutes per session: Not on file  . Stress: Not on file  Relationships  . Social connections:    Talks on phone: Not on file    Gets together: Not on file    Attends religious service: Not on file    Active member of club or organization: Not on file    Attends meetings of clubs or organizations: Not on file    Relationship status: Not on file  . Intimate partner violence:    Fear of current or ex partner: Not on file    Emotionally abused: Not on file    Physically abused: Not on file    Forced  sexual activity: Not on file  Other Topics Concern  . Not on file  Social History Narrative   Exercise--- walking   Family History: The patient'sfamily history includes Diabetes in her sister; Hypertension in her mother. There is no history of Colon cancer or Stomach cancer.  ROS: Please see the history of present illness.  All other ROS reviewed and negative.   Physical Exam/Data:         Vitals:   05/30/18 1117 05/30/18 1120 05/30/18 1122 05/30/18 1130  BP: 130/77 120/71 129/82 129/72  Pulse:   70 68  Resp: (!) 24 (!) 27 (!) 26 (!) 27  Temp:      TempSrc:      SpO2:   99% 99%  Weight:      Height:        Intake/Output Summary (Last 24 hours) at 05/30/2018 1136 Last data filed at 05/30/2018 1048    Gross per 24 hour  Intake 3.27 ml  Output -  Net 3.27 ml   Last 3 Weights 05/30/2018 04/20/2018 02/17/2018  Weight (lbs) 160 lb 162 lb 161 lb 3.2 oz  Weight (kg) 72.576 kg 73.483 kg 73.12 kg   Body mass index is 24.33 kg/m. General:Appears uncomfortable HEENT:normal Lymph: no adenopathy Neck:no JVD Endocrine:No thryomegaly Vascular: No carotid bruits; FA pulses 2+ bilaterally without bruits  Cardiac:normal S1, S2; RRR; no murmur  Lungs: clear to auscultation bilaterally, no wheezing, rhonchi or rales  GEX:BMWU, no distention, tender to palpation. Ext:no LEedema Musculoskeletal: No deformities, BUE and BLE strength normal and equal Skin: warm and dry  Neuro:CNs 2-12 intact, no focal abnormalities noted Psych: Normal affect    EKG: The ECG that was done was personally reviewed and demonstrates sinus rhythm with subtle ST elevation in AVR and lead 3 with ST depression lateral and anterolateral leads  Relevant CV Studies:   Laboratory Data:  Chemistry LastLabs     Recent Labs  Lab 05/30/18 1011  NA 132*  K 3.5  CL 100  CO2 16*  GLUCOSE 112*  BUN 21*  CREATININE 1.48*  CALCIUM 8.6*   GFRNONAA 39*  GFRAA 45*  ANIONGAP 16*     LastLabs  No results for input(s): PROT, ALBUMIN, AST, ALT, ALKPHOS, BILITOT in the last 168 hours.   Hematology LastLabs  No results for input(s): WBC, RBC, HGB, HCT, MCV, MCH, MCHC, RDW, PLT in the last 168 hours.   Cardiac Enzymes LastLabs     Recent Labs  Lab 05/30/18 1011  TROPONINI >65.00*     LastLabs  No results for input(s): TROPIPOC in the last 168 hours.   BNP LastLabs  No results for input(s): BNP, PROBNP in the last 168 hours.   DDimer Elie Confer  No results for input(s): DDIMER in the last 168 hours.    Radiology/Studies: Dg Chest Portable 1 View  Result Date: 05/30/2018 CLINICAL DATA: Chest pain EXAM: PORTABLE CHEST 1 VIEW COMPARISON: 01/12/2018 chest radiograph. FINDINGS: Stable cardiomediastinal silhouette with top-normal heart size. No pneumothorax. No pleural effusion. Lungs appear clear, with no acute consolidative airspace disease and no pulmonary edema. IMPRESSION: No active disease. Electronically Signed By: Ilona Sorrel M.D. On: 05/30/2018 10:48    Assessment and Plan:   1. Acute inferior STEMI: Code STEMI was called but she was found to have a possible dissection of the ascending aorta. This was not reported on the initial reading from radiology but was confirmed prior to the start of the cardiac cath. She was not moved to the cath table. No access obtained in the cath lab. Pt taken back to the ED for further care and STAT CT surgery consultation.  The patient was taken to the OR and intraoperative TEE did not show evidence of a aortic dissection or flap. Surface echo with inferior wall hypokinesis. Case reviewed with Dr. Servando Snare. Pt will be brought to the cath lab for emergent cardiac catheterization  Of note, cath delayed due to CTA imaging results and possible acute aortic syndrome as above.   The patient was moved from the OR to the cath lab. She is intubated  at  this time.     Signed, Lauree Chandler, MD 5/2/20202:38PM

## 2018-05-30 NOTE — ED Notes (Signed)
Pt returned from CT, reports feeling worse.

## 2018-05-30 NOTE — Brief Op Note (Signed)
      [facility]       [place] [address]             [phone]    05/30/2018  3:25 PM  PATIENT:  Samantha [name]  58 y.o. female  PRE-OPERATIVE DIAGNOSIS: Question of Ascending Aortic Dissection / Hematoma, Acute MI   POST-OPERATIVE DIAGNOSIS: Aortic Root normal on TEE, Inferior hypokinesis   PROCEDURE:  TEE   SURGEON:  Surgeon(s) and Role:    Grace Isaac, MD - Primary   ASSISTANTS: none   ANESTHESIA:   general  EBL:  0 mL   BLOOD ADMINISTERED:none  DRAINS: none   LOCAL MEDICATIONS USED:  NONE  SPECIMEN:  No Specimen  DISPOSITION OF SPECIMEN:  N/A  COUNTS: none  DICTATION: .Dragon Dictation  PLAN OF CARE: Admit to inpatient   PATIENT DISPOSITION:  to cath lab for cardiac cath   Delay start of Pharmacological VTE agent (>24hrs) due to surgical blood loss or risk of bleeding: not applicable

## 2018-05-30 NOTE — ED Notes (Signed)
Samantha [name]- [phone]- [phone]

## 2018-05-30 NOTE — Anesthesia Procedure Notes (Signed)
Arterial Line Insertion Start/End5/02/2018 1:05 PM, 05/30/2018 1:25 PM Performed by: Flowers, Rokoshi T, Immunologist, CRNA  Patient location: OR. Preanesthetic checklist: patient identified, IV checked, site marked, risks and benefits discussed, surgical consent, monitors and equipment checked, pre-op evaluation and anesthesia consent Patient sedated Right, radial was placed Catheter size: 20 G Hand hygiene performed , maximum sterile barriers used  and Seldinger technique used Allen's test indicative of satisfactory collateral circulation Procedure performed without using ultrasound guided technique. Ultrasound Notes:anatomy identified, needle tip was noted to be adjacent to the nerve/plexus identified and no ultrasound evidence of intravascular and/or intraneural injection Following insertion, dressing applied and Biopatch. Post procedure assessment: normal  Patient tolerated the procedure well with no immediate complications.

## 2018-05-31 ENCOUNTER — Inpatient Hospital Stay (HOSPITAL_COMMUNITY): Payer: Medicare Other

## 2018-05-31 DIAGNOSIS — R570 Cardiogenic shock: Secondary | ICD-10-CM

## 2018-05-31 DIAGNOSIS — I2111 ST elevation (STEMI) myocardial infarction involving right coronary artery: Secondary | ICD-10-CM

## 2018-05-31 DIAGNOSIS — I340 Nonrheumatic mitral (valve) insufficiency: Secondary | ICD-10-CM

## 2018-05-31 DIAGNOSIS — R0689 Other abnormalities of breathing: Secondary | ICD-10-CM

## 2018-05-31 LAB — MAGNESIUM: Magnesium: 2 mg/dL (ref 1.7–2.4)

## 2018-05-31 LAB — BASIC METABOLIC PANEL
Anion gap: 10 (ref 5–15)
BUN: 13 mg/dL (ref 6–20)
CO2: 17 mmol/L — ABNORMAL LOW (ref 22–32)
Calcium: 7.8 mg/dL — ABNORMAL LOW (ref 8.9–10.3)
Chloride: 109 mmol/L (ref 98–111)
Creatinine, Ser: 0.56 mg/dL (ref 0.44–1.00)
GFR calc Af Amer: >60 mL/min (ref >60)
GFR calc non Af Amer: >60 mL/min (ref >60)
Glucose, Bld: 122 mg/dL — ABNORMAL HIGH (ref 70–99)
Potassium: 3.4 mmol/L — ABNORMAL LOW (ref 3.5–5.1)
Sodium: 136 mmol/L (ref 135–145)

## 2018-05-31 LAB — CBC
HCT: 33 % — ABNORMAL LOW (ref 36.0–46.0)
Hemoglobin: 10.3 g/dL — ABNORMAL LOW (ref 12.0–15.0)
MCH: 28.2 pg (ref 26.0–34.0)
MCHC: 31.2 g/dL (ref 30.0–36.0)
MCV: 90.4 fL (ref 80.0–100.0)
Platelets: 155 10*3/uL (ref 150–400)
RBC: 3.65 MIL/uL — ABNORMAL LOW (ref 3.87–5.11)
RDW: 13.8 % (ref 11.5–15.5)
WBC: 10.7 10*3/uL — ABNORMAL HIGH (ref 4.0–10.5)
nRBC: 0 % (ref 0.0–0.2)

## 2018-05-31 LAB — POCT ACTIVATED CLOTTING TIME
Activated Clotting Time: 577 seconds
Activated Clotting Time: 131 seconds

## 2018-05-31 LAB — COOXEMETRY PANEL
Carboxyhemoglobin: 1.1 % (ref 0.5–1.5)
Methemoglobin: 1.3 % (ref 0.0–1.5)
O2 Saturation: 52.1 %
Total hemoglobin: 10.9 g/dL — ABNORMAL LOW (ref 12.0–16.0)

## 2018-05-31 LAB — TROPONIN I
Troponin I: >65 ng/mL (ref <0.03)
Troponin I: >65 ng/mL (ref <0.03)

## 2018-05-31 LAB — GLUCOSE, CAPILLARY
Glucose-Capillary: 99 mg/dL (ref 70–99)
Glucose-Capillary: 96 mg/dL (ref 70–99)

## 2018-05-31 LAB — ECHOCARDIOGRAM COMPLETE
Height: 68 in
Weight: 2560 oz

## 2018-05-31 MED ORDER — SODIUM CHLORIDE 0.9 % IV BOLUS
500.0000 mL | Freq: Once | INTRAVENOUS | Status: AC
  Administered 2018-05-31: 500 mL via INTRAVENOUS

## 2018-05-31 MED ORDER — PANTOPRAZOLE SODIUM 40 MG PO PACK
40.0000 mg | PACK | Freq: Every day | ORAL | Status: DC
  Administered 2018-05-31 – 2018-06-01 (×2): 40 mg
  Filled 2018-05-31 (×2): qty 20

## 2018-05-31 MED ORDER — MIDAZOLAM HCL 2 MG/2ML IJ SOLN
1.0000 mg | INTRAMUSCULAR | Status: DC | PRN
  Administered 2018-05-31 – 2018-06-01 (×4): 2 mg via INTRAVENOUS
  Filled 2018-05-31 (×4): qty 2

## 2018-05-31 MED ORDER — ACETAMINOPHEN 160 MG/5ML PO SOLN
650.0000 mg | Freq: Once | ORAL | Status: AC
  Administered 2018-05-31: 650 mg via ORAL
  Filled 2018-05-31: qty 20.3

## 2018-05-31 MED ORDER — ASPIRIN 81 MG PO CHEW
81.0000 mg | CHEWABLE_TABLET | Freq: Every day | ORAL | Status: DC
  Administered 2018-05-31 – 2018-06-01 (×2): 81 mg
  Filled 2018-05-31 (×2): qty 1

## 2018-05-31 MED ORDER — ATORVASTATIN CALCIUM 80 MG PO TABS
80.0000 mg | ORAL_TABLET | Freq: Every day | ORAL | Status: DC
  Administered 2018-05-31: 80 mg
  Filled 2018-05-31: qty 1

## 2018-05-31 MED ORDER — FENTANYL CITRATE (PF) 100 MCG/2ML IJ SOLN
50.0000 ug | Freq: Once | INTRAMUSCULAR | Status: AC
  Administered 2018-05-31: 50 ug via INTRAVENOUS

## 2018-05-31 MED ORDER — FENTANYL BOLUS VIA INFUSION
50.0000 ug | INTRAVENOUS | Status: DC | PRN
  Administered 2018-05-31 – 2018-06-01 (×5): 50 ug via INTRAVENOUS
  Filled 2018-05-31: qty 50

## 2018-05-31 MED ORDER — ADULT MULTIVITAMIN LIQUID CH
15.0000 mL | Freq: Every day | ORAL | Status: DC
  Administered 2018-05-31 – 2018-06-01 (×2): 15 mL via ORAL
  Filled 2018-05-31 (×2): qty 15

## 2018-05-31 MED ORDER — FENTANYL 2500MCG IN NS 250ML (10MCG/ML) PREMIX INFUSION
50.0000 ug/h | INTRAVENOUS | Status: DC
  Administered 2018-05-31: 15:00:00 50 ug/h via INTRAVENOUS
  Filled 2018-05-31: qty 250

## 2018-05-31 MED ORDER — TICAGRELOR 90 MG PO TABS
90.0000 mg | ORAL_TABLET | Freq: Two times a day (BID) | ORAL | Status: DC
  Administered 2018-05-31 – 2018-06-01 (×3): 90 mg
  Filled 2018-05-31 (×3): qty 1

## 2018-05-31 MED ORDER — ATROPINE SULFATE 1 MG/10ML IJ SOSY
PREFILLED_SYRINGE | INTRAMUSCULAR | Status: AC
  Filled 2018-05-31: qty 10

## 2018-05-31 MED ORDER — POTASSIUM CHLORIDE 20 MEQ/15ML (10%) PO SOLN
20.0000 meq | ORAL | Status: AC
  Administered 2018-05-31 (×2): 20 meq
  Filled 2018-05-31 (×2): qty 15

## 2018-05-31 NOTE — Progress Notes (Addendum)
Advanced Heart Failure Rounding Note   Subjective:    Admitted yesterday with CP and elevated troponin > 65. CT chest suggestive of possible dissection. Taken to OR but no dissection found on pre-op TEE. Moved back to cath lab and found to have heavily thrombosed RCA -> underwent successful PCI  Remains intubated this am but awake on vent and following commands.   On NE 3-5/ Is in Wenckebach on ECG. Denies CP. Trop remains > 65 this am.   EF 40-45% by TEE in OR.   Swan numbers done personally at bedside with Rn: RA 15 PA 30/18 (23) PCWP 15 Thermo 3.5/1.9   Objective:   Weight Range:  Vital Signs:   Temp:  [97 F (36.1 C)-99 F (37.2 C)] 99 F (37.2 C) (05/03 0900) Pulse Rate:  [52-79] 56 (05/03 0815) Resp:  [15-28] 19 (05/03 0900) BP: (60-133)/(41-100) 95/63 (05/03 0900) SpO2:  [83 %-100 %] 83 % (05/03 0815) Arterial Line BP: (30-202)/(2-139) 30/2 (05/03 0615) FiO2 (%):  [40 %-100 %] 40 % (05/03 0820) Weight:  [72.6 kg] 72.6 kg (05/02 1003) Last BM Date: (PTA)  Weight change: Filed Weights   05/30/18 1003  Weight: 72.6 kg    Intake/Output:   Intake/Output Summary (Last 24 hours) at 05/31/2018 0933 Last data filed at 05/31/2018 0900 Gross per 24 hour  Intake 2798.1 ml  Output 1845 ml  Net 953.1 ml     Physical Exam: General:  Intubated awake and following coomand HEENT: normal Neck: supple. + RIJ swan CVP to jaw  Carotids 2+ bilat; no bruits. No lymphadenopathy or thryomegaly appreciated. Cor: PMI nondisplaced. Regulary, irregular rate & rhythm. No rubs, gallops or murmurs. Lungs: clear Abdomen: soft, nontender, nondistended. No hepatosplenomegaly. No bruits or masses. Good bowel sounds. Extremities: no cyanosis, clubbing, rash, edema Neuro: awake on vent follows commands   Telemetry: Sinus with Wenkebach 50s Personally reviewed   Labs: Basic Metabolic Panel: Recent Labs  Lab 05/30/18 1011 05/30/18 2027 05/31/18 0532  NA 132* 134* 136  K 3.5  3.7 3.4*  CL 100  --  109  CO2 16*  --  17*  GLUCOSE 112*  --  122*  BUN 21*  --  13  CREATININE 1.48*  --  0.56  CALCIUM 8.6*  --  7.8*  MG 2.0  --   --     Liver Function Tests: Recent Labs  Lab 05/30/18 1107  AST 425*  ALT 76*  ALKPHOS 90  BILITOT 0.7  PROT 6.5  ALBUMIN 3.2*   Recent Labs  Lab 05/30/18 1107  LIPASE 27   No results for input(s): AMMONIA in the last 168 hours.  CBC: Recent Labs  Lab 05/30/18 1010 05/30/18 2027 05/31/18 0532  WBC 6.1  --  10.7*  HGB 11.4* 10.5* 10.3*  HCT 36.8 31.0* 33.0*  MCV 92.0  --  90.4  PLT 158  --  155    Cardiac Enzymes: Recent Labs  Lab 05/30/18 1011 05/30/18 1719 05/31/18 0034 05/31/18 0532  TROPONINI >65.00* >65.00* >65.00* >65.00*    BNP: BNP (last 3 results) No results for input(s): BNP in the last 8760 hours.  ProBNP (last 3 results) No results for input(s): PROBNP in the last 8760 hours.    Other results:  Imaging: Portable Chest X-ray  Result Date: 05/30/2018 CLINICAL DATA:  Intubation, history hypertension, lupus, smoker EXAM: PORTABLE CHEST 1 VIEW COMPARISON:  Portable exam 2030 hours compared to 05/30/2018 at 1015 hours FINDINGS: Tip of endotracheal  tube projects 2.8 cm above carina. RIGHT jugular Swan-Ganz catheter with tip projecting over RIGHT lower lobe pulmonary artery. Nasogastric tube extends into stomach. Normal heart size, mediastinal contours, and pulmonary vascularity. Atherosclerotic calcification aorta. Bibasilar atelectasis greater on LEFT. No gross pleural effusion or pneumothorax. IMPRESSION: Line and tube positions as above. Bibasilar atelectasis greater on LEFT. Electronically Signed   By: Lavonia Dana M.D.   On: 05/30/2018 20:40   Dg Chest Portable 1 View  Result Date: 05/30/2018 CLINICAL DATA:  Chest pain EXAM: PORTABLE CHEST 1 VIEW COMPARISON:  01/12/2018 chest radiograph. FINDINGS: Stable cardiomediastinal silhouette with top-normal heart size. No pneumothorax. No pleural  effusion. Lungs appear clear, with no acute consolidative airspace disease and no pulmonary edema. IMPRESSION: No active disease. Electronically Signed   By: Ilona Sorrel M.D.   On: 05/30/2018 10:48   Dg Abd Portable 1v  Result Date: 05/30/2018 CLINICAL DATA:  OG tube placement EXAM: PORTABLE ABDOMEN - 1 VIEW COMPARISON:  None. FINDINGS: OG tube tip is in the mid to distal stomach. Nonobstructive bowel gas pattern. IMPRESSION: OG tube tip in the mid to distal stomach. Electronically Signed   By: Rolm Baptise M.D.   On: 05/30/2018 20:15   Ct Angio Chest/abd/pel For Dissection W And/or Wo Contrast  Result Date: 05/30/2018 CLINICAL DATA:  Severe chest pain radiating into the back with nausea and vomiting. Hypotension. EXAM: CT ANGIOGRAPHY CHEST, ABDOMEN AND PELVIS TECHNIQUE: Multidetector CT imaging through the chest, abdomen and pelvis was performed using the standard protocol during bolus administration of intravenous contrast. Multiplanar reconstructed images and MIPs were obtained and reviewed to evaluate the vascular anatomy. CONTRAST:  187m OMNIPAQUE IOHEXOL 350 MG/ML SOLN COMPARISON:  Chest radiograph from earlier today. 03/06/2018 screening chest CT. 12/13/2015 CT abdomen/pelvis. FINDINGS: CTA CHEST FINDINGS Cardiovascular: Mild cardiomegaly. No significant pericardial effusion/thickening. Left anterior descending and right coronary atherosclerosis. Atherosclerotic nonaneurysmal thoracic aorta. There is new wall thickening in the ascending thoracic aorta compared to 03/06/2018 screening chest CT study, which extends to the junction with the aortic arch and which measures complex fluid density, which may indicate acute intramural hematoma. No dissection flap within the thoracic aorta. No pseudoaneurysm. No penetrating atherosclerotic ulcer. Aortic arch branch vessels are patent. Normal caliber pulmonary arteries. No central pulmonary emboli. Mediastinum/Nodes: No discrete thyroid nodules. Unremarkable  esophagus. No pathologically enlarged axillary, mediastinal or hilar lymph nodes. Lungs/Pleura: No pneumothorax. No pleural effusion. Mild centrilobular and paraseptal emphysema with mild diffuse bronchial wall thickening. Low-attenuation material obstructs the left lower lobe bronchus. Subsegmental scarring versus atelectasis in the left lower lobe. No acute consolidative airspace disease, lung masses or significant pulmonary nodules. Musculoskeletal: No aggressive appearing focal osseous lesions. Mild thoracic spondylosis. Review of the MIP images confirms the above findings. CTA ABDOMEN AND PELVIS FINDINGS VASCULAR Aorta: Normal caliber aorta without aneurysm, dissection, vasculitis or significant stenosis. Celiac: Patent without evidence of aneurysm, dissection, vasculitis or significant stenosis. SMA: Patent without evidence of aneurysm, dissection, vasculitis or significant stenosis. Renals: Both renal arteries are patent without evidence of aneurysm, dissection, vasculitis, fibromuscular dysplasia or significant stenosis. IMA: Patent without evidence of aneurysm, dissection, vasculitis or significant stenosis. Inflow: Patent without evidence of aneurysm, dissection, vasculitis or significant stenosis. Veins: No obvious venous abnormality within the limitations of this arterial phase study. Review of the MIP images confirms the above findings. NON-VASCULAR Hepatobiliary: Normal liver with no liver mass. Normal gallbladder with no radiopaque cholelithiasis. No biliary ductal dilatation. Pancreas: Normal, with no mass or duct dilation. Spleen: Normal size. No  mass. Adrenals/Urinary Tract: Normal adrenals. Simple 8.6 cm anterior lower right renal cyst. No hydronephrosis. Normal bladder. Stomach/Bowel: Normal non-distended stomach. Normal caliber small bowel with no small bowel wall thickening. Normal appendix. Normal large bowel with no diverticulosis, large bowel wall thickening or pericolonic fat stranding.  Vascular/Lymphatic: Atherosclerotic nonaneurysmal abdominal aorta. No pathologically enlarged lymph nodes in the abdomen or pelvis. Reproductive: Mildly enlarged myomatous uterus with calcified uterine fibroids. No adnexal masses. Other: No pneumoperitoneum, ascites or focal fluid collection. Musculoskeletal: No aggressive appearing focal osseous lesions. Moderate lower lumbar spondylosis. Review of the MIP images confirms the above findings. IMPRESSION: 1. New wall thickening in the ascending thoracic aorta compared to 03/06/2018 screening chest CT study, measuring complex fluid density, favored to represent acute aortic syndrome due to type A acute intramural hematoma. No visible dissection flap. No aneurysm or pseudoaneurysm. 2. Mild cardiomegaly.  Two-vessel coronary atherosclerosis. 3. Mild centrilobular and paraseptal emphysema with mild diffuse bronchial wall thickening, suggesting COPD. 4. Mildly enlarged calcified myomatous uterus. 5. Aortic Atherosclerosis (ICD10-I70.0) and Emphysema (ICD10-J43.9). Critical Value/emergent results were called by telephone at the time of interpretation on 05/30/2018 at 11:36 am to Dr. Virgel Manifold and to Dr. Serita Sheller in the cath lab, who verbally acknowledged these results. Electronically Signed   By: Ilona Sorrel M.D.   On: 05/30/2018 11:42      Medications:     Scheduled Medications:  aspirin  81 mg Per Tube Daily   atorvastatin  80 mg Per Tube q1800   atropine       atropine       chlorhexidine gluconate (MEDLINE KIT)  15 mL Mouth Rinse BID   mouth rinse  15 mL Mouth Rinse 10 times per day   pantoprazole sodium  40 mg Per Tube Daily   potassium chloride  20 mEq Per Tube Q4H   sodium chloride flush  3 mL Intravenous Q12H   ticagrelor  90 mg Per Tube BID     Infusions:  sodium chloride     dexmedetomidine Stopped (05/31/18 0817)   norepinephrine (LEVOPHED) Adult infusion 3 mcg/min (05/30/18 1530)   tirofiban 0.075 mcg/kg/min (05/31/18  0900)     PRN Medications:  sodium chloride, acetaminophen, fentaNYL (SUBLIMAZE) injection, fentaNYL (SUBLIMAZE) injection, ondansetron (ZOFRAN) IV, sodium chloride flush   Diagnostic  Dominance: Right    Intervention       Assessment:   58 y/o woman with SLE and HTN 5/2 with CP and elevated troponin > 65. CT chest suggestive of possible dissection. Taken to OR but no dissection found on pre-op TEE. Moved back to cath lab and found to have heavily thrombosed RCA -> underwent successful PCI  Plan/Discussion:    1. 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 Trop persistently > 65 - Swan numbers with low output and elevated R-sided pressures c/w RV infarct - Continue NE support. No b-blocker or dig with heart block. Hold off on diuresis today. Hopefully will improve with time. Check Co-ox - Echo in OR EF 40-45% no obvious mechanical complications - minimal CAD in LAD/LCX - continue ASA/Brilinta, statin. No b-blocker with bradycardia and Wenckebach - relook cath in am   2. Acute hypoxic respiratory failure - d/w CCM at bedside. Given tenuous hemodynamics will leave intubated today  3. Abnormality on chest CT - question hematoma around aortic arch.  - reviewed with Dr.  Servando Snare and initially went to OR but surgery cancelled as no dissection on TEE - d/w Dr. Servando Snare. Remains unclear about what CT finding represents in setting of SLE. Will need repeat imaging to ensure stability -doubt acute dissection  4. 2nd degree heart block, Type I (Wenckebach) - continue to follow as above  5. SLE - check ANA and ds-DNA to assess activity  - [employer] currently on hold   6. Tobacco use - can use nicotine patch as needed  7. Hypokalemia - supp K  CRITICAL CARE Performed by: Glori Bickers  Total critical care time: 45 minutes  Critical care time was  exclusive of separately billable procedures and treating other patients.  Critical care was necessary to treat or prevent imminent or life-threatening deterioration.  Critical care was time spent personally by me (independent of midlevel providers or residents) on the following activities: development of treatment plan with patient and/or surrogate as well as nursing, discussions with consultants, evaluation of patient's response to treatment, examination of patient, obtaining history from patient or surrogate, ordering and performing treatments and interventions, ordering and review of laboratory studies, ordering and review of radiographic studies, pulse oximetry and re-evaluation of patient's condition.    Length of Stay: 1   Glori Bickers MD 05/31/2018, 9:33 AM  Advanced Heart Failure Team Pager [phone] (M-F; 7a - 4p)  Please contact [facility] for night-coverage after hours (4p -7a ) and weekends on [email or web address]

## 2018-05-31 NOTE — Procedures (Deleted)
Extubation Procedure Note  Patient Details:   Name: Samantha [name] DOB: [birth year] MRN: [id number]   Airway Documentation:  Airway 7.5 mm (Active)  Secured at (cm) 23 cm 05/31/2018  8:01 AM  Measured From Lips 05/31/2018  8:01 AM  Secured Location Right 05/31/2018  8:01 AM  Secured By [employer] 05/31/2018  8:01 AM  Tube Holder Repositioned Yes 05/31/2018  8:01 AM  Cuff Pressure (cm H2O) 30 cm H2O 05/31/2018  8:01 AM  Site Condition Dry 05/31/2018  8:01 AM   Vent end date: (not recorded) Vent end time: (not recorded)   Evaluation  O2 sats: stable throughout Complications: No apparent complications Patient did tolerate procedure well. Bilateral Breath Sounds: Clear, Diminished   Yes   Leak test positive for air flow around deflated cuff, no stridor noted, pt is very HOH, instructions giving to cough & deep breath.  Kenzly Rogoff 05/31/2018, 10:35 AM

## 2018-05-31 NOTE — Progress Notes (Addendum)
NAME:  Samantha [name], MRN:  [id number], DOB:  1960-05-12, LOS: 1 ADMISSION DATE:  05/30/2018, CONSULTATION DATE: 05/30/2018 REFERRING MD: Angelena Form, CHIEF COMPLAINT:  Chest and abdominal pain   Brief History   58 y/o F who presented 5/2 pm with complaints of chest and abdominal pain. Found to be hypotensive with ST changes on EKG.  Ruled out for aortic dissection but found to have RCA lesion requiring stent. CT imaging concerning for probable hematoma around the aortic arch but was not seen on TEE in the OR.    Past Medical History  Lupus Tobacco Abuse MI  Significant Hospital Events   5/02 Admit with chest/abd pain. 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    Objective   Blood pressure 93/74, pulse 64, temperature 98.1 F (36.7 C), resp. rate 16, height 5\' 8"  (1.727 m), weight 72.6 kg, last menstrual period 04/11/2011, SpO2 98 %. PAP: (22-61)/(9-23) 23/16 CVP:  [9 mmHg-30 mmHg] 12 mmHg  Vent Mode: PSV;CPAP FiO2 (%):  [40 %-100 %] 40 % Set Rate:  [16 bmp] 16 bmp Vt Set:  [510 mL] 510 mL PEEP:  [5 cmH20] 5 cmH20 Pressure Support:  [5 cmH20] 5 cmH20 Plateau Pressure:  [18 cmH20-30 cmH20] 18 cmH20   Intake/Output Summary (Last 24 hours) at 05/31/2018 0831 Last data filed at 05/31/2018 0700 Gross per 24 hour  Intake 2740.04 ml  Output 1800 ml  Net 940.04 ml   Filed Weights   05/30/18 1003  Weight: 72.6 kg    Examination: General: critically ill appearing adult female lying in bed in NAD on vent HEENT: MM  pink/moist, ETT, R IJ cordis with Swan Neuro: awakens to voice, nods yes/no, follows commands  CV: s1s2 distant, no m/r/g, Mobitz Type I block noted on monitor initially, then coupling noted, rates of 36-48 PULM: even/non-labored, lungs bilaterally clear  ER:XVQM, non-tender, bsx4 active  Extremities: warm/dry, no edema  Skin: no rashes or lesions  Resolved Hospital Problem list      Assessment & Plan:   STEMI -s/p RCA thrombotic lesion P: Per Cardiology > plan to continue aggrastat 48-36 hours, re-look LHC on 5/4 ICU monitoring  High intensity statin  Hold home lopressor, cozaar ASA PT QD  Shock P: Continue levophed, wean for MAP >65 560ml bolus x1 over 1 hour  Mobitz I Block  P: Assess EKG now  Avoid beta blockers  Stopped precedex  Follow tele for changes   Questionable Aortic Arch Hematoma  -not seen on TEE P: Per CVTS > plan for repeat CTA imaging prior to discharge   Acute Respiratory Insufficiency  Hypokalemia  P: PRVC 8cc/kg  Wean PEEP / FiO2 for sats > 90% PSV wean with possible extubation 5/3  Follow intermittent CXR  KCL 40 mEq x1  Assess Mg+  AKI P: Trend BMP / urinary output Replace electrolytes as indicated Avoid nephrotoxic agents, ensure adequate renal perfusion  Lupus  -on cellcept at home P: Hold cellcept for now   Anemia  P: Trend CBC  Transfuse per ICU guidelines   Best practice:  Diet: NPO Pain/Anxiety/Delirium protocol (if indicated): PRN fentanyl  VAP protocol (if indicated): Yes DVT prophylaxis: On anticoagulation currently on SCD GI prophylaxis: PPI Glucose control: n/a Mobility: Bedrest Code Status: Full Family Communication: update per primary  Disposition: ICU.  Plan of care discussed with CVTS, Cardiology & CCM attending.  Labs   CBC: Recent Labs  Lab 05/30/18 1010 05/30/18 2027 05/31/18 0532  WBC 6.1  --  10.7*  HGB 11.4* 10.5* 10.3*  HCT 36.8 31.0* 33.0*  MCV 92.0  --  90.4  PLT 158  --  155     Basic Metabolic Panel: Recent Labs  Lab 05/30/18 1011 05/30/18 2027 05/31/18 0532  NA 132* 134* 136  K 3.5 3.7 3.4*  CL 100  --  109  CO2 16*  --  17*  GLUCOSE 112*  --  122*  BUN 21*  --  13  CREATININE 1.48*  --  0.56  CALCIUM 8.6*  --  7.8*  MG 2.0  --   --    GFR: Estimated Creatinine Clearance: 79.2 mL/min (by C-G formula based on SCr of 0.56 mg/dL). Recent Labs  Lab 05/30/18 1010 05/30/18 1011 05/31/18 0532  WBC 6.1  --  10.7*  LATICACIDVEN  --  2.0*  --     Liver Function Tests: Recent Labs  Lab 05/30/18 1107  AST 425*  ALT 76*  ALKPHOS 90  BILITOT 0.7  PROT 6.5  ALBUMIN 3.2*   Recent Labs  Lab 05/30/18 1107  LIPASE 27   No results for input(s): AMMONIA in the last 168 hours.  ABG    Component Value Date/Time   PHART 7.347 (L) 05/30/2018 2027   PCO2ART 35.9 05/30/2018 2027   PO2ART 369.0 (H) 05/30/2018 2027   HCO3 19.9 (L) 05/30/2018 2027   TCO2 21 (L) 05/30/2018 2027   ACIDBASEDEF 5.0 (H) 05/30/2018 2027   O2SAT 100.0 05/30/2018 2027     Coagulation Profile: Recent Labs  Lab 05/30/18 1011  INR 1.1    Cardiac Enzymes: Recent Labs  Lab 05/30/18 1011 05/30/18 1719 05/31/18 0034 05/31/18 0532  TROPONINI >65.00* >65.00* >65.00* >65.00*    HbA1C: Hgb A1c MFr Bld  Date/Time Value Ref Range Status  03/13/2011 05:14 AM 5.6 <5.7 % Final    Comment:    (NOTE)                                                                       According to the ADA Clinical Practice Recommendations for 2011, when HbA1c is used as a screening test:  >=6.5%   Diagnostic of Diabetes Mellitus           (if abnormal result is confirmed) 5.7-6.4%   Increased risk of developing Diabetes Mellitus References:Diagnosis and Classification of Diabetes Mellitus,Diabetes BJSE,8315,17(OHYWV 1):S62-S69 and Standards of Medical Care in         Diabetes - 2011,Diabetes PXTG,6269,48 (Suppl 1):S11-S61.    CBG: Recent Labs  Lab 05/30/18 2028  GLUCAP 116*     Critical Care Time:  71 minutes     Noe Gens, NP-C [facility] Pgr: [phone] or if no answer [phone] 05/31/2018, 8:31 AM

## 2018-05-31 NOTE — Progress Notes (Signed)
Patient ID: Samantha [name], female   DOB: [birth year], 58 y.o.   MRN: [id number] [facility] DAILY ICU PROGRESS NOTE                   [address]            [place] [address]          [phone]   1 Day Post-Op Procedure(s) (LRB): Coronary/Graft Acute MI Revascularization (N/A) LEFT HEART CATH AND CORONARY ANGIOGRAPHY (N/A)  Total Length of Stay:  LOS: 1 day   Subjective: *Patient remains on the ventilator but is awake and follow commands  Objective: Vital signs in last 24 hours: Temp:  [97 F (36.1 C)-99 F (37.2 C)] 99 F (37.2 C) (05/03 0900) Pulse Rate:  [52-79] 56 (05/03 0815) Cardiac Rhythm: (P) Heart block (05/03 0900) Resp:  [15-28] 19 (05/03 0900) BP: (60-133)/(41-100) 95/63 (05/03 0900) SpO2:  [83 %-100 %] 83 % (05/03 0815) Arterial Line BP: (30-202)/(2-139) 30/2 (05/03 0615) FiO2 (%):  [40 %-100 %] 40 % (05/03 0820) Weight:  [72.6 kg] 72.6 kg (05/02 1003)  Filed Weights   05/30/18 1003  Weight: 72.6 kg    Weight change:    Hemodynamic parameters for last 24 hours: PAP: (22-61)/(9-23) 27/16 CVP:  [9 mmHg-30 mmHg] 15 mmHg  Intake/Output from previous day: 05/02 0701 - 05/03 0700 In: 2740 [I.V.:1740; IV Piggyback:1000] Out: 1800 [Urine:1800]  Intake/Output this shift: Total I/O In: 28.1 [I.V.:28.1] Out: 45 [Urine:45]  Current Meds: Scheduled Meds: . aspirin  81 mg Oral Daily  . atorvastatin  80 mg Oral q1800  . atropine      . atropine      . chlorhexidine gluconate (MEDLINE KIT)  15 mL Mouth Rinse BID  . mouth rinse  15 mL Mouth Rinse 10 times per day  . pantoprazole (PROTONIX) IV  40 mg Intravenous Daily  . potassium chloride  20 mEq Per Tube Q4H  . sodium chloride flush  3 mL Intravenous Q12H  . ticagrelor  90 mg Oral BID   Continuous Infusions: . sodium chloride    . dexmedetomidine Stopped (05/31/18 0817)  . norepinephrine (LEVOPHED) Adult infusion 3 mcg/min (05/30/18 1530)  . tirofiban 0.075 mcg/kg/min (05/31/18 0900)    PRN Meds:.sodium chloride, acetaminophen, fentaNYL (SUBLIMAZE) injection, fentaNYL (SUBLIMAZE) injection, ondansetron (ZOFRAN) IV, sodium chloride flush  General appearance: alert, cooperative and no distress Neurologic: intact Heart: Bradycardia Lungs: diminished breath sounds bibasilar Abdomen: soft, non-tender; bowel sounds normal; no masses,  no organomegaly Extremities: extremities normal, atraumatic, no cyanosis or edema, Homans sign is negative, no sign of DVT and A lines in place could be removed  Lab Results: CBC: Recent Labs    05/30/18 1010 05/30/18 2027 05/31/18 0532  WBC 6.1  --  10.7*  HGB 11.4* 10.5* 10.3*  HCT 36.8 31.0* 33.0*  PLT 158  --  155   BMET:  Recent Labs    05/30/18 1011 05/30/18 2027 05/31/18 0532  NA 132* 134* 136  K 3.5 3.7 3.4*  CL 100  --  109  CO2 16*  --  17*  GLUCOSE 112*  --  122*  BUN 21*  --  13  CREATININE 1.48*  --  0.56  CALCIUM 8.6*  --  7.8*    CMET: Lab Results  Component Value Date   WBC 10.7 (H) 05/31/2018   HGB 10.3 (L) 05/31/2018   HCT 33.0 (L) 05/31/2018   PLT 155 05/31/2018   GLUCOSE 122 (H)  05/31/2018   CHOL 168 05/30/2018   TRIG 99 05/30/2018   HDL 64 05/30/2018   LDLDIRECT 141.8 09/15/2012   LDLCALC NOT CALCULATED 05/30/2018   ALT 76 (H) 05/30/2018   AST 425 (H) 05/30/2018   NA 136 05/31/2018   K 3.4 (L) 05/31/2018   CL 109 05/31/2018   CREATININE 0.56 05/31/2018   BUN 13 05/31/2018   CO2 17 (L) 05/31/2018   TSH 0.83 09/15/2012   INR 1.1 05/30/2018   HGBA1C 5.6 03/13/2011      PT/INR:  Recent Labs    05/30/18 1011  LABPROT 13.7  INR 1.1   Radiology: Portable Chest X-ray  Result Date: 05/30/2018 CLINICAL DATA:  Intubation, history hypertension, lupus, smoker EXAM: PORTABLE CHEST 1 VIEW COMPARISON:  Portable exam 2030 hours compared to 05/30/2018 at 1015 hours FINDINGS: Tip of endotracheal tube projects 2.8 cm above carina. RIGHT jugular Swan-Ganz catheter with tip projecting over RIGHT lower  lobe pulmonary artery. Nasogastric tube extends into stomach. Normal heart size, mediastinal contours, and pulmonary vascularity. Atherosclerotic calcification aorta. Bibasilar atelectasis greater on LEFT. No gross pleural effusion or pneumothorax. IMPRESSION: Line and tube positions as above. Bibasilar atelectasis greater on LEFT. Electronically Signed   By: Lavonia Dana M.D.   On: 05/30/2018 20:40   Dg Chest Portable 1 View  Result Date: 05/30/2018 CLINICAL DATA:  Chest pain EXAM: PORTABLE CHEST 1 VIEW COMPARISON:  01/12/2018 chest radiograph. FINDINGS: Stable cardiomediastinal silhouette with top-normal heart size. No pneumothorax. No pleural effusion. Lungs appear clear, with no acute consolidative airspace disease and no pulmonary edema. IMPRESSION: No active disease. Electronically Signed   By: Ilona Sorrel M.D.   On: 05/30/2018 10:48    Assessment/Plan: S/P Procedure(s) (LRB): Coronary/Graft Acute MI Revascularization (N/A) LEFT HEART CATH AND CORONARY ANGIOGRAPHY (N/A) 1/Patient on ventilator, but is awake and neurologically intact, from exam and are questioning while on ventilator she is not seem to have any acute abdominal problem going on, symptoms from yesterday were likely related to her hypotension and inferior myocardial infarction. 2/renal function stabilized 3/intermittent second-degree heart block today with bradycardia, likely secondary to inferior infarct 4/before discharge home with plan on repeat CTA of the chest.   No acute surgical intervention at this point but will continue to follow with you    Grace Isaac 05/31/2018 9:21 AM

## 2018-05-31 NOTE — Progress Notes (Signed)
EKG CRITICAL VALUE     12 lead EKG performed.  Critical value noted.  sara, RN notified.   Genia Plants, CCT 05/31/2018 9:02 AM

## 2018-05-31 NOTE — Progress Notes (Signed)
[facility] ADULT ICU REPLACEMENT PROTOCOL FOR AM LAB REPLACEMENT ONLY  The patient does apply for the [facility] Adult ICU Electrolyte Replacment Protocol based on the criteria listed below:   1. Is GFR >/= 40 ml/min? Yes.    Patient's GFR today is >60 2. Is urine output >/= 0.5 ml/kg/hr for the last 6 hours? Yes.   Patient's UOP is 1.8 ml/kg/hr 3. Is BUN < 60 mg/dL? Yes.    Patient's BUN today is 13 4. Abnormal electrolyte(s): k 3.4 5. Ordered repletion with: protocol 6. If a panic level lab has been reported, has the CCM MD in charge been notified? No..   Physician:    Ronda Fairly A 05/31/2018 6:45 AM

## 2018-05-31 NOTE — Progress Notes (Addendum)
ACT 131. Sheath initiated at 0555, pressure held until 0615. No complications noted Pt. Vital signs stable, sheath site level 0, hemodynamically stable. Pt. Educated.

## 2018-05-31 NOTE — Progress Notes (Signed)
  Echocardiogram 2D Echocardiogram has been performed.  Samantha [name] 05/31/2018, 11:39 AM

## 2018-05-31 NOTE — Progress Notes (Signed)
Pt became extremely agitated, not tolerating ventilator, BPs 190s. Required bagging to maintain sats. 2mg  Versed, 200 Fentanyl given. MD notified.

## 2018-06-01 ENCOUNTER — Inpatient Hospital Stay (HOSPITAL_COMMUNITY): Payer: Medicare Other

## 2018-06-01 ENCOUNTER — Encounter (HOSPITAL_COMMUNITY): Payer: Self-pay | Admitting: Cardiovascular Disease

## 2018-06-01 DIAGNOSIS — J9601 Acute respiratory failure with hypoxia: Secondary | ICD-10-CM

## 2018-06-01 DIAGNOSIS — I2119 ST elevation (STEMI) myocardial infarction involving other coronary artery of inferior wall: Principal | ICD-10-CM

## 2018-06-01 LAB — CBC
HCT: 29.2 % — ABNORMAL LOW (ref 36.0–46.0)
Hemoglobin: 9.3 g/dL — ABNORMAL LOW (ref 12.0–15.0)
MCH: 28.7 pg (ref 26.0–34.0)
MCHC: 31.8 g/dL (ref 30.0–36.0)
MCV: 90.1 fL (ref 80.0–100.0)
Platelets: 145 10*3/uL — ABNORMAL LOW (ref 150–400)
RBC: 3.24 MIL/uL — ABNORMAL LOW (ref 3.87–5.11)
RDW: 13.7 % (ref 11.5–15.5)
WBC: 8.7 10*3/uL (ref 4.0–10.5)
nRBC: 0 % (ref 0.0–0.2)

## 2018-06-01 LAB — HEPATIC FUNCTION PANEL
ALT: 70 U/L — ABNORMAL HIGH (ref 0–44)
AST: 276 U/L — ABNORMAL HIGH (ref 15–41)
Albumin: 2.3 g/dL — ABNORMAL LOW (ref 3.5–5.0)
Alkaline Phosphatase: 96 U/L (ref 38–126)
Bilirubin, Direct: 0.3 mg/dL — ABNORMAL HIGH (ref 0.0–0.2)
Indirect Bilirubin: 1 mg/dL — ABNORMAL HIGH (ref 0.3–0.9)
Total Bilirubin: 1.3 mg/dL — ABNORMAL HIGH (ref 0.3–1.2)
Total Protein: 5.2 g/dL — ABNORMAL LOW (ref 6.5–8.1)

## 2018-06-01 LAB — BASIC METABOLIC PANEL
Anion gap: 9 (ref 5–15)
BUN: 8 mg/dL (ref 6–20)
CO2: 20 mmol/L — ABNORMAL LOW (ref 22–32)
Calcium: 7.8 mg/dL — ABNORMAL LOW (ref 8.9–10.3)
Chloride: 110 mmol/L (ref 98–111)
Creatinine, Ser: 0.6 mg/dL (ref 0.44–1.00)
GFR calc Af Amer: >60 mL/min (ref >60)
GFR calc non Af Amer: >60 mL/min (ref >60)
Glucose, Bld: 101 mg/dL — ABNORMAL HIGH (ref 70–99)
Potassium: 3.4 mmol/L — ABNORMAL LOW (ref 3.5–5.1)
Sodium: 139 mmol/L (ref 135–145)

## 2018-06-01 LAB — POCT I-STAT 7, (LYTES, BLD GAS, ICA,H+H)
Acid-base deficit: 10 mmol/L — ABNORMAL HIGH (ref 0.0–2.0)
Bicarbonate: 17 mmol/L — ABNORMAL LOW (ref 20.0–28.0)
Calcium, Ion: 1.06 mmol/L — ABNORMAL LOW (ref 1.15–1.40)
HCT: 32 % — ABNORMAL LOW (ref 36.0–46.0)
Hemoglobin: 10.9 g/dL — ABNORMAL LOW (ref 12.0–15.0)
O2 Saturation: 100 %
Potassium: 3.7 mmol/L (ref 3.5–5.1)
Sodium: 135 mmol/L (ref 135–145)
TCO2: 18 mmol/L — ABNORMAL LOW (ref 22–32)
pCO2 arterial: 41.9 mmHg (ref 32.0–48.0)
pH, Arterial: 7.217 — ABNORMAL LOW (ref 7.350–7.450)
pO2, Arterial: 260 mmHg — ABNORMAL HIGH (ref 83.0–108.0)

## 2018-06-01 LAB — PREPARE PLATELET PHERESIS: Unit division: 0

## 2018-06-01 LAB — BPAM PLATELET PHERESIS
Blood Product Expiration Date: 202005022359
ISSUE DATE / TIME: 202005021337
Unit Type and Rh: 5100

## 2018-06-01 LAB — COOXEMETRY PANEL
Methemoglobin: 1.4 % (ref 0.0–1.5)
O2 Saturation: 97 %
Total hemoglobin: 9.7 g/dL — ABNORMAL LOW (ref 12.0–16.0)

## 2018-06-01 LAB — GLUCOSE, CAPILLARY
Glucose-Capillary: 123 mg/dL — ABNORMAL HIGH (ref 70–99)
Glucose-Capillary: 125 mg/dL — ABNORMAL HIGH (ref 70–99)
Glucose-Capillary: 66 mg/dL — ABNORMAL LOW (ref 70–99)

## 2018-06-01 MED ORDER — PANTOPRAZOLE SODIUM 40 MG PO TBEC
40.0000 mg | DELAYED_RELEASE_TABLET | Freq: Every day | ORAL | Status: DC
  Administered 2018-06-02 – 2018-06-03 (×2): 40 mg via ORAL
  Filled 2018-06-01 (×2): qty 1

## 2018-06-01 MED ORDER — POTASSIUM CHLORIDE 20 MEQ/15ML (10%) PO SOLN
20.0000 meq | ORAL | Status: AC
  Administered 2018-06-01 (×2): 20 meq
  Filled 2018-06-01 (×2): qty 15

## 2018-06-01 MED ORDER — ATORVASTATIN CALCIUM 80 MG PO TABS
80.0000 mg | ORAL_TABLET | Freq: Every day | ORAL | Status: DC
  Administered 2018-06-01 – 2018-06-02 (×2): 80 mg via ORAL
  Filled 2018-06-01 (×2): qty 1

## 2018-06-01 MED ORDER — MORPHINE SULFATE (PF) 2 MG/ML IV SOLN
1.0000 mg | Freq: Once | INTRAVENOUS | Status: AC
  Administered 2018-06-01: 18:00:00 1 mg via INTRAVENOUS
  Filled 2018-06-01: qty 1

## 2018-06-01 MED ORDER — LIP MEDEX EX OINT
TOPICAL_OINTMENT | CUTANEOUS | Status: DC | PRN
  Filled 2018-06-01: qty 7

## 2018-06-01 MED ORDER — MYCOPHENOLATE MOFETIL 250 MG PO CAPS
1500.0000 mg | ORAL_CAPSULE | Freq: Two times a day (BID) | ORAL | Status: DC
  Administered 2018-06-02 – 2018-06-03 (×3): 1500 mg via ORAL
  Filled 2018-06-01 (×5): qty 6

## 2018-06-01 MED ORDER — ORAL CARE MOUTH RINSE
15.0000 mL | Freq: Four times a day (QID) | OROMUCOSAL | Status: DC
  Administered 2018-06-01 – 2018-06-02 (×2): 15 mL via OROMUCOSAL

## 2018-06-01 MED ORDER — ACETAMINOPHEN 500 MG PO TABS
500.0000 mg | ORAL_TABLET | Freq: Once | ORAL | Status: AC
  Administered 2018-06-01: 500 mg via ORAL
  Filled 2018-06-01: qty 1

## 2018-06-01 MED ORDER — CYCLOBENZAPRINE HCL 10 MG PO TABS
10.0000 mg | ORAL_TABLET | Freq: Three times a day (TID) | ORAL | Status: AC | PRN
  Administered 2018-06-01 – 2018-06-02 (×3): 10 mg via ORAL
  Filled 2018-06-01 (×3): qty 1

## 2018-06-01 MED ORDER — ADULT MULTIVITAMIN W/MINERALS CH
1.0000 | ORAL_TABLET | Freq: Every day | ORAL | Status: DC
  Administered 2018-06-02 – 2018-06-03 (×2): 1 via ORAL
  Filled 2018-06-01 (×2): qty 1

## 2018-06-01 MED ORDER — ASPIRIN 81 MG PO CHEW
81.0000 mg | CHEWABLE_TABLET | Freq: Every day | ORAL | Status: DC
  Administered 2018-06-02 – 2018-06-03 (×2): 81 mg via ORAL
  Filled 2018-06-01 (×2): qty 1

## 2018-06-01 MED ORDER — NITROGLYCERIN 0.4 MG SL SUBL
0.4000 mg | SUBLINGUAL_TABLET | SUBLINGUAL | Status: DC | PRN
  Administered 2018-06-01: 0.4 mg via SUBLINGUAL
  Filled 2018-06-01 (×2): qty 1

## 2018-06-01 MED ORDER — TICAGRELOR 90 MG PO TABS
90.0000 mg | ORAL_TABLET | Freq: Two times a day (BID) | ORAL | Status: DC
  Administered 2018-06-01 – 2018-06-03 (×4): 90 mg via ORAL
  Filled 2018-06-01 (×4): qty 1

## 2018-06-01 MED ORDER — IBUPROFEN 100 MG PO CHEW
100.0000 mg | CHEWABLE_TABLET | Freq: Three times a day (TID) | ORAL | Status: DC | PRN
  Administered 2018-06-02: 100 mg via ORAL
  Filled 2018-06-01 (×3): qty 1

## 2018-06-01 MED ORDER — BLISTEX MEDICATED EX OINT: TOPICAL_OINTMENT | CUTANEOUS | Status: DC | PRN

## 2018-06-01 NOTE — Progress Notes (Signed)
Progress Note  Patient Name: Samantha [name] Date of Encounter: 06/01/2018  Primary Cardiologist: No primary care provider on file.   Subjective   Intubated, awake and following commands. Denies pain.  Inpatient Medications    Scheduled Meds: . aspirin  81 mg Per Tube Daily  . atorvastatin  80 mg Per Tube q1800  . chlorhexidine gluconate (MEDLINE KIT)  15 mL Mouth Rinse BID  . mouth rinse  15 mL Mouth Rinse 10 times per day  . multivitamin  15 mL Oral Daily  . pantoprazole sodium  40 mg Per Tube Daily  . potassium chloride  20 mEq Per Tube Q4H  . sodium chloride flush  3 mL Intravenous Q12H  . ticagrelor  90 mg Per Tube BID   Continuous Infusions: . sodium chloride 1,000 mL (05/31/18 1805)  . fentaNYL infusion INTRAVENOUS 75 mcg/hr (06/01/18 0700)  . norepinephrine (LEVOPHED) Adult infusion 8 mcg/min (06/01/18 0700)  . tirofiban 0.15 mcg/kg/min (06/01/18 0704)   PRN Meds: sodium chloride, fentaNYL, midazolam, nitroGLYCERIN, ondansetron (ZOFRAN) IV, sodium chloride flush   Vital Signs    Vitals:   06/01/18 0400 06/01/18 0600 06/01/18 0700 06/01/18 0840  BP:  121/84 132/90 135/88  Pulse: 85 85 87 83  Resp: 16 14 15 19   Temp: 99.5 F (37.5 C) 99.7 F (37.6 C) 99.5 F (37.5 C)   TempSrc:      SpO2: 99% 100% 100% 98%  Weight:      Height:        Intake/Output Summary (Last 24 hours) at 06/01/2018 0952 Last data filed at 06/01/2018 0700 Gross per 24 hour  Intake 1216.48 ml  Output 1225 ml  Net -8.52 ml   Last 3 Weights 05/30/2018 04/20/2018 02/17/2018  Weight (lbs) 160 lb 162 lb 161 lb 3.2 oz  Weight (kg) 72.576 kg 73.483 kg 73.12 kg      Telemetry    Normal sinus rhythm - Personally Reviewed  ECG    Sinus bradycardia, age-indeterminate inferoposterior MI - Personally Reviewed  Physical Exam  Awake, alert, on ventilator GEN: No acute distress.   Neck: No JVD Cardiac: RRR, no murmurs, rubs, or gallops.  Respiratory: Clear to auscultation  bilaterally. GI: Soft, nontender, non-distended  MS: No edema; No deformity.  Right radial cath site clear Neuro:  Nonfocal  Skin: Warm and dry with no rash  Labs    Chemistry Recent Labs  Lab 05/30/18 1011 05/30/18 1107  05/30/18 2027 05/31/18 0532 06/01/18 0417  NA 132*  --    < > 134* 136 139  K 3.5  --    < > 3.7 3.4* 3.4*  CL 100  --   --   --  109 110  CO2 16*  --   --   --  17* 20*  GLUCOSE 112*  --   --   --  122* 101*  BUN 21*  --   --   --  13 8  CREATININE 1.48*  --   --   --  0.56 0.60  CALCIUM 8.6*  --   --   --  7.8* 7.8*  PROT  --  6.5  --   --   --  5.2*  ALBUMIN  --  3.2*  --   --   --  2.3*  AST  --  425*  --   --   --  276*  ALT  --  76*  --   --   --  70*  ALKPHOS  --  90  --   --   --  96  BILITOT  --  0.7  --   --   --  1.3*  GFRNONAA 39*  --   --   --  >60 >60  GFRAA 45*  --   --   --  >60 >60  ANIONGAP 16*  --   --   --  10 9   < > = values in this interval not displayed.     Hematology Recent Labs  Lab 05/30/18 1010  05/30/18 2027 05/31/18 0532 06/01/18 0417  WBC 6.1  --   --  10.7* 8.7  RBC 4.00  --   --  3.65* 3.24*  HGB 11.4*   < > 10.5* 10.3* 9.3*  HCT 36.8   < > 31.0* 33.0* 29.2*  MCV 92.0  --   --  90.4 90.1  MCH 28.5  --   --  28.2 28.7  MCHC 31.0  --   --  31.2 31.8  RDW 13.6  --   --  13.8 13.7  PLT 158  --   --  155 145*   < > = values in this interval not displayed.    Cardiac Enzymes Recent Labs  Lab 05/30/18 1011 05/30/18 1719 05/31/18 0034 05/31/18 0532  TROPONINI >65.00* >65.00* >65.00* >65.00*   No results for input(s): TROPIPOC in the last 168 hours.   BNPNo results for input(s): BNP, PROBNP in the last 168 hours.   DDimer No results for input(s): DDIMER in the last 168 hours.   Radiology    Dg Chest Port 1 View  Result Date: 06/01/2018 CLINICAL DATA:  Acute respiratory failure with hypoxia. EXAM: PORTABLE CHEST 1 VIEW COMPARISON:  Chest x-ray dated May 30, 2018. FINDINGS: Unchanged endotracheal and  enteric tubes. Unchanged right internal jugular Swan-Ganz catheter with the tip in the region of the distal right interlobar pulmonary artery. The heart size and mediastinal contours are within normal limits. Normal pulmonary vascularity. Improved aeration at the lung bases. No focal consolidation, pleural effusion, or pneumothorax. No acute osseous abnormality. IMPRESSION: 1. Improved aeration at the lung bases. 2. Unchanged right internal jugular Swan-Ganz catheter with tip in the region of the distal right interlobar pulmonary artery. Consider retracting at least 4 cm. Electronically Signed   By: Obie Dredge M.D.   On: 06/01/2018 07:44   Portable Chest X-ray  Result Date: 05/30/2018 CLINICAL DATA:  Intubation, history hypertension, lupus, smoker EXAM: PORTABLE CHEST 1 VIEW COMPARISON:  Portable exam 2030 hours compared to 05/30/2018 at 1015 hours FINDINGS: Tip of endotracheal tube projects 2.8 cm above carina. RIGHT jugular Swan-Ganz catheter with tip projecting over RIGHT lower lobe pulmonary artery. Nasogastric tube extends into stomach. Normal heart size, mediastinal contours, and pulmonary vascularity. Atherosclerotic calcification aorta. Bibasilar atelectasis greater on LEFT. No gross pleural effusion or pneumothorax. IMPRESSION: Line and tube positions as above. Bibasilar atelectasis greater on LEFT. Electronically Signed   By: Ulyses Southward M.D.   On: 05/30/2018 20:40   Dg Chest Portable 1 View  Result Date: 05/30/2018 CLINICAL DATA:  Chest pain EXAM: PORTABLE CHEST 1 VIEW COMPARISON:  01/12/2018 chest radiograph. FINDINGS: Stable cardiomediastinal silhouette with top-normal heart size. No pneumothorax. No pleural effusion. Lungs appear clear, with no acute consolidative airspace disease and no pulmonary edema. IMPRESSION: No active disease. Electronically Signed   By: Delbert Phenix M.D.   On: 05/30/2018 10:48   Dg Abd Portable 1v  Result Date: 05/30/2018  CLINICAL DATA:  OG tube placement EXAM:  PORTABLE ABDOMEN - 1 VIEW COMPARISON:  None. FINDINGS: OG tube tip is in the mid to distal stomach. Nonobstructive bowel gas pattern. IMPRESSION: OG tube tip in the mid to distal stomach. Electronically Signed   By: Charlett Nose M.D.   On: 05/30/2018 20:15   Ct Angio Chest/abd/pel For Dissection W And/or Wo Contrast  Result Date: 05/30/2018 CLINICAL DATA:  Severe chest pain radiating into the back with nausea and vomiting. Hypotension. EXAM: CT ANGIOGRAPHY CHEST, ABDOMEN AND PELVIS TECHNIQUE: Multidetector CT imaging through the chest, abdomen and pelvis was performed using the standard protocol during bolus administration of intravenous contrast. Multiplanar reconstructed images and MIPs were obtained and reviewed to evaluate the vascular anatomy. CONTRAST:  OMNIPAQUE IOHEXOL 350 MG/ML SOLN COMPARISON:  Chest radiograph from earlier today. 03/06/2018 screening chest CT. 12/13/2015 CT abdomen/pelvis. FINDINGS: CTA CHEST FINDINGS Cardiovascular: Mild cardiomegaly. No significant pericardial effusion/thickening. Left anterior descending and right coronary atherosclerosis. Atherosclerotic nonaneurysmal thoracic aorta. There is new wall thickening in the ascending thoracic aorta compared to 03/06/2018 screening chest CT study, which extends to the junction with the aortic arch and which measures complex fluid density, which may indicate acute intramural hematoma. No dissection flap within the thoracic aorta. No pseudoaneurysm. No penetrating atherosclerotic ulcer. Aortic arch branch vessels are patent. Normal caliber pulmonary arteries. No central pulmonary emboli. Mediastinum/Nodes: No discrete thyroid nodules. Unremarkable esophagus. No pathologically enlarged axillary, mediastinal or hilar lymph nodes. Lungs/Pleura: No pneumothorax. No pleural effusion. Mild centrilobular and paraseptal emphysema with mild diffuse bronchial wall thickening. Low-attenuation material obstructs the left lower lobe bronchus.  Subsegmental scarring versus atelectasis in the left lower lobe. No acute consolidative airspace disease, lung masses or significant pulmonary nodules. Musculoskeletal: No aggressive appearing focal osseous lesions. Mild thoracic spondylosis. Review of the MIP images confirms the above findings. CTA ABDOMEN AND PELVIS FINDINGS VASCULAR Aorta: Normal caliber aorta without aneurysm, dissection, vasculitis or significant stenosis. Celiac: Patent without evidence of aneurysm, dissection, vasculitis or significant stenosis. SMA: Patent without evidence of aneurysm, dissection, vasculitis or significant stenosis. Renals: Both renal arteries are patent without evidence of aneurysm, dissection, vasculitis, fibromuscular dysplasia or significant stenosis. IMA: Patent without evidence of aneurysm, dissection, vasculitis or significant stenosis. Inflow: Patent without evidence of aneurysm, dissection, vasculitis or significant stenosis. Veins: No obvious venous abnormality within the limitations of this arterial phase study. Review of the MIP images confirms the above findings. NON-VASCULAR Hepatobiliary: Normal liver with no liver mass. Normal gallbladder with no radiopaque cholelithiasis. No biliary ductal dilatation. Pancreas: Normal, with no mass or duct dilation. Spleen: Normal size. No mass. Adrenals/Urinary Tract: Normal adrenals. Simple 8.6 cm anterior lower right renal cyst. No hydronephrosis. Normal bladder. Stomach/Bowel: Normal non-distended stomach. Normal caliber small bowel with no small bowel wall thickening. Normal appendix. Normal large bowel with no diverticulosis, large bowel wall thickening or pericolonic fat stranding. Vascular/Lymphatic: Atherosclerotic nonaneurysmal abdominal aorta. No pathologically enlarged lymph nodes in the abdomen or pelvis. Reproductive: Mildly enlarged myomatous uterus with calcified uterine fibroids. No adnexal masses. Other: No pneumoperitoneum, ascites or focal fluid  collection. Musculoskeletal: No aggressive appearing focal osseous lesions. Moderate lower lumbar spondylosis. Review of the MIP images confirms the above findings. IMPRESSION: 1. New wall thickening in the ascending thoracic aorta compared to 03/06/2018 screening chest CT study, measuring complex fluid density, favored to represent acute aortic syndrome due to type A acute intramural hematoma. No visible dissection flap. No aneurysm or pseudoaneurysm. 2. Mild cardiomegaly.  Two-vessel coronary atherosclerosis.  3. Mild centrilobular and paraseptal emphysema with mild diffuse bronchial wall thickening, suggesting COPD. 4. Mildly enlarged calcified myomatous uterus. 5. Aortic Atherosclerosis (ICD10-I70.0) and Emphysema (ICD10-J43.9). Critical Value/emergent results were called by telephone at the time of interpretation on 05/30/2018 at 11:36 am to Dr. Raeford Razor and to Dr. Camillo Flaming in the cath lab, who verbally acknowledged these results. Electronically Signed   By: Delbert Phenix M.D.   On: 05/30/2018 11:42    Cardiac Studies   Echo: IMPRESSIONS    1. The left ventricle has low normal systolic function, with an ejection fraction of 50-55%. The cavity size was normal. Left ventricular diastolic Doppler parameters are consistent with impaired relaxation.  2. LVEF is approximately 50 to 55% with severe hypokinesis of the inferor/inferoseptal walls.  3. The right ventricle has mildly reduced systolic function. The cavity was mildly enlarged. There is no increase in right ventricular wall thickness.  4. The inferior vena cava was dilated in size with <50% respiratory variability.  5. The interatrial septum was not assessed.  Cardiac Cath: Conclusion     Mid LAD lesion is 20% stenosed.  Ost LAD to Prox LAD lesion is 20% stenosed.  Prox RCA lesion is 100% stenosed.  Dist RCA lesion is 90% stenosed.  A drug-eluting stent was successfully placed using a STENT SYNERGY DES 3X24.  Post  intervention, there is a 0% residual stenosis.  A drug-eluting stent was successfully placed using a STENT SYNERGY DES 2.75X20.  Post intervention, there is a 0% residual stenosis.   1. 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 Successful PTCA/aspiration thrombectomy and drug eluting stent placement x 1 in the proximal RCA and drug eluting stent placement x 1 in the distal RCA. Significant thrombus burden remains in the mid RCA and distal RCA branches.  3. Mild non-obstructive disease in the LAD. 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 Cardiogenic shock  Recommendations: She is hemodynamically stable on low dose Levophed. Will continue Levophed. Will continue ASA. Will give a loading dose of Brilinta 180 mg x 1 in the ICU after OG tube placement. Continue Cangrelor drip until one hour after the Brilinta is given. Will then stop Cangrelor drip and start Aggrastat drip which will infuse for the next 36-48 hours. Plan re-look cardiac cath Monday. I am hopeful that the thrombus burden that remains in the vessel will resolve with medical therapy. Beta blocker tomorrow if shock resolved. Will start high intensity statin. Echo tomorrow.    Coronary Findings   Diagnostic  Dominance: Right  Left Anterior Descending  Vessel is large.  Ost LAD to Prox LAD lesion 20% stenosed  Ost LAD to Prox LAD lesion is 20% stenosed.  Mid LAD lesion 20% stenosed  Mid LAD lesion is 20% stenosed.  Left Circumflex  Vessel is small.  Right Coronary Artery  Vessel is large.  Prox RCA lesion 100% stenosed  Prox RCA lesion is 100% stenosed. The lesion is heavily thrombotic.  Dist RCA lesion 90% stenosed  Dist RCA lesion is 90% stenosed.  Right Posterior Atrioventricular Branch  Collaterals  Post Atrio filled by collaterals from 3rd Sept.    Intervention   Prox RCA lesion  Stent  [place] GUIDE 6FR JR4 guide catheter was inserted. Pre-stent  angioplasty was performed using a BALLOON SAPPHIRE 2.5X12. A drug-eluting stent was successfully placed using a STENT SYNERGY DES 3X24. Stent strut is well apposed. Post-stent angioplasty was performed using a BALLOON SAPPHIRE  [place] 3.5X18.  Post-Intervention Lesion Assessment  The intervention was successful. Pre-interventional TIMI flow is 0. Post-intervention TIMI flow is 3. No complications occurred at this lesion.  There is a 0% residual stenosis post intervention.  Dist RCA lesion  Stent  Pre-stent angioplasty was performed using a BALLOON SAPPHIRE 2.5X12. A drug-eluting stent was successfully placed using a STENT SYNERGY DES 2.75X20. Stent strut is well apposed. Stent does not overlap previously placed stentPost-stent angioplasty was not performed.  Post-Intervention Lesion Assessment  The intervention was successful. Pre-interventional TIMI flow is 0. Post-intervention TIMI flow is 3. No complications occurred at this lesion.  There is a 0% residual stenosis post intervention.  Coronary Diagrams   Diagnostic  Dominance: Right    Intervention       Patient Profile     58 y.o. female with acute inferior wall MI complicated by RV infarct and cardiogenic shock, status post PCI on 5/2  Assessment & Plan    1.  Acute inferior wall/RV infarction: Cath films reviewed.  Unable to review the entire study as it is not available in the image reviewing system.  The patient is clinically improved.  She underwent implantation of 2 drug-eluting stents in the RCA with restoration of TIMI-3 flow.  There was some residual thrombus burden documented.  She seems to be doing very well and has now had over 36 hours of IV tirofiban.  She will continue on aspirin and ticagrelor.  Will stop tirofiban.  We will review her films when the complete study is available with consideration of relook cardiac catheterization.  Considering her rapid clinical improvement, may favor ongoing medical therapy without relook  catheterization.  2.  Acute hypoxemic respiratory failure: Patient improving on minimal ventilator support this morning.  Hemodynamics also improving.  Management per CCM.  3.  Cardiogenic shock: Likely secondary to acute RV infarct.  Weaning off of norepinephrine this morning.  Pulmonary capillary wedge pressure is 11-12 this morning.  Checked at the bedside with Dr. Gala Romney.  Continue to wean off pressors.  4.  Questionable thoracic aortic hematoma: Some discrepancy in CTA findings and TEE.  Clinical event seems secondary to acute infarction rather than acute aortic pathology.  Anticipate repeat CT scan once the patient is stabilized.  5.  Transaminitis: Pattern of AST greater than ALT elevation consistent with myocardial enzyme release associated with acute MI.  Doubt true liver injury.  Continue high intensity statin drug.  Will follow transaminases.  Disposition: Likely keep in CCU today.  Shock/heart failure management per Dr. Gala Romney.  Will review cath films in entirety when available.  Continue aspirin, ticagrelor, and a high intensity statin drug.  For questions or updates, please contact [facility] Please consult [email or web address] for contact info under        Signed, Tonny Bollman, MD  06/01/2018, 9:52 AM

## 2018-06-01 NOTE — Progress Notes (Signed)
eLink Physician-Brief Progress Note Patient Name: Samantha [name] DOB: [birth year] MRN: [id number]   Date of Service  06/01/2018  HPI/Events of Note  Requesting and pain medications.   eICU Interventions  Resumed home PRN Flexeril.  Ordered as needed ibuprofen as could not give Tylenol because of elevated liver function.      Intervention Category Intermediate Interventions: Communication with other healthcare providers and/or family;Pain - evaluation and management;Medication change / dose adjustment Evaluation Type: Other  Mady Gemma 06/01/2018, 9:54 PM

## 2018-06-01 NOTE — Procedures (Signed)
Extubation Procedure Note  Patient Details:   Name: Samantha [name] DOB: January 25, 1961 MRN: [id number]   Airway Documentation:    Vent end date: 06/01/18 Vent end time: 1102   Evaluation  O2 sats: stable throughout Complications: No apparent complications Patient did tolerate procedure well. Bilateral Breath Sounds: Clear   Yes   Patient was extubated to a 4L [place] without any complications, dyspnea or stridor noted. Patient was instructed on IS x 10, highest goal achieved was 1,054mL. Positive cuff leak.   [name] L 06/01/2018, 11:02 AM

## 2018-06-01 NOTE — Op Note (Signed)
NAME: AUDRYNA, WENDT MEDICAL RECORD ZO:[id number] ACCOUNT [id number] DATE OF BIRTH:November 11, 1960 FACILITY: [facility] LOCATION: [facility] PHYSICIAN:Macrina Lehnert Maryruth Bun, MD  OPERATIVE REPORT  DATE OF PROCEDURE:  05/30/2018  PREOPERATIVE DIAGNOSIS:  Inferior myocardial infarction, question of aortic dissection.  POSTOPERATIVE DIAGNOSIS:  Inferior myocardial infarction, question of aortic dissection.    PROCEDURE PERFORMED:  Transesophageal echo.  SURGEON:  Lanelle Bal, MD  BRIEF HISTORY:  The patient is a 58 year old female with known history of lupus, currently treated with CellCept and Plaquenil, who presented to the [facility] on the morning of 04/02, having had chest discomfort and abdominal discomfort for the  preceding 12 hours while at home.  She was brought to the emergency room with a blood pressure in the 60s and was started on Levophed.  Initial troponin was greater than 65.  EKG showed some changes in the inferior leads but borderline criteria for  STEMI.  The patient underwent a CTA of the chest, and after, the preliminary reading of the CT scan was sent from the OR to the cath lab.  Dr. Angelena Form was planning on starting cardiac catheterization when subsequent report of the CT suggested an aortic  dissection.  The patient was then transferred back to the emergency room without cardiac cath, and cardiac surgery was consulted.  The patient was seen.  CT scan was reviewed.  There was no definite dissection plane noted on the CTA of the chest, abdomen  and pelvis.  A scan was done with the appropriate timing of contrast.  There was evidence of thickening of the ascending aorta, but the previous scans that she had were difficult to compare as they were done as a lung cancer screening study without  contrast.  Without a definitive diagnosis of aortic dissection, it was decided to take the patient to the operating room, intubate her, and perform TEE as a collaborating study to  correlate with the CT.  The patient was agreeable with this, and she was  urgently transferred to the operating room and underwent general endotracheal anesthesia.  Arterial lines were placed for monitoring, and a TEE probe was placed by Dr. Tobias Alexander.  This study was reviewed carefully and discussed with Dr. Angelena Form.  Dr. ____  came in and also reviewed the study.  We saw no evidence of aortic dissection.  The aortic valve and aortic root appeared totally normal without any external compression such as hematoma that could explain a troponin of 65.  There was evidence of  inferior wall hypokinesis.  Although the arch and distal ascending aorta were hard to completely visualize, we did not see an arterial flap and could really not adequately judge the "thickening of the aorta."  It was decided at this point not to proceed  with sternotomy and transferred the patient directly back to the cath lab for emergency heart catheterization.  This was done in a proximal right coronary artery thrombus where luminal clot was noted and intervened by Dr. Angelena Form.  The patient  stabilized and was transferred to the cardiac care unit following the procedure.  Note:  Cardiac surgery was performed other than the diagnostic review of the patient's intraoperative TEE.  LN/NUANCE  D:06/01/2018 T:06/01/2018 JOB:[id number]

## 2018-06-01 NOTE — Progress Notes (Signed)
NAME:  Samantha [name], MRN:  [id number], DOB:  08-14-1960, LOS: 2 ADMISSION DATE:  05/30/2018, CONSULTATION DATE: 05/30/2018 REFERRING MD: Angelena Form, CHIEF COMPLAINT:  Chest and abdominal pain   Brief History   58 y/o F who presented 5/2 pm with complaints of chest and abdominal pain. Found to be hypotensive with ST changes on EKG.  Ruled out for aortic dissection but found to have RCA lesion requiring stent. CT imaging concerning for probable hematoma around the aortic arch but was not seen on TEE in the OR.    Past Medical History  Lupus Tobacco Abuse MI  Significant Hospital Events   5/02 Admit with chest/abd pain. RCA lesion stented  Consults:  CVTS   Procedures:  ETT 5/2 >>  R IJ Cordis/Swan 5/2 >>   Significant Diagnostic Tests:  CTA Chest 5/2 >> new wall thickening in the ascending thoracic aorta compared to 02/2018, concern for acute aortic syndrome due to type A intramural hematoma LHC 5/2 >> acute inferior STEMI 2/2 thrombotic occlusion of the proximal RCA   Micro Data:  COVID 5/2 >> negative   Antimicrobials:     Interim history/subjective:  Tmax 102.4 likely post-op. Afebrile now. Patient calm and interactive this morning. No plan for repeat cath per Cardiology  Objective   Blood pressure 132/90, pulse 87, temperature 99.5 F (37.5 C), resp. rate 15, height 5\' 8"  (1.727 m), weight 72.6 kg, last menstrual period 04/11/2011, SpO2 100 %. PAP: (15-57)/(11-23) 27/18 CVP:  [10 mmHg-36 mmHg] 14 mmHg PCWP:  [15 mmHg] 15 mmHg CO:  [3.5 L/min-6.1 L/min] 6.1 L/min CI:  [1.9 L/min/m2-3.3 L/min/m2] 3.3 L/min/m2  Vent Mode: PRVC FiO2 (%):  [40 %] 40 % Set Rate:  [16 bmp] 16 bmp Vt Set:  [510 mL] 510 mL PEEP:  [5 cmH20] 5 cmH20 Plateau Pressure:  [14 cmH20-34 cmH20] 34 cmH20   Intake/Output Summary (Last 24 hours) at 06/01/2018 7322 Last data filed at 06/01/2018 0700 Gross per 24 hour  Intake 1225.35 ml  Output 1265 ml  Net -39.65 ml   Filed Weights   05/30/18 1003   Weight: 72.6 kg   Physical Exam: General: Well-appearing, no acute distress, on mechanical ventilator HENT: [place], AT, ETT in place Eyes: EOMI, no scleral icterus Respiratory: Clear to auscultation bilaterally.  No crackles, wheezing or rales Cardiovascular: RRR, -M/R/G, no JVD GI: BS+, soft, nontender Extremities:-Edema,-tenderness Neuro: Awake and alert, follows commands, CNII-XII grossly intact GU: Foley in place  CXR 5/4 - ETT in place  Resolved Hospital Problem list    AKI  Assessment & Plan:   Acute Respiratory Insufficiency  COVID-negative P: Extubate DC mechanical vent orders  Cardiogenic shock secondary to inferior STEMI S/p DES x 2 to RCA on 5/2. Loaded with Brilinta P: Wean levophed, wean for MAP >65 Cardiology DC'd aggrastat Daily ASA, statin and Brilinta Hold lopressor until off pressor  Mobitz I Block  P: Telemetry Avoid beta blockers   Questionable Aortic Arch Hematoma  -not seen on TEE P: Per CVTS > plan for repeat CTA imaging prior to discharge   Post-op fever Afebrile now P: Monitor vital  Hypokalemia P: Replete  Lupus  -on cellcept at home P: Restart home cellcept tomorrow  Anemia  P: Trend CBC  Transfuse per ICU guidelines   Best practice:  Diet: NPO Pain/Anxiety/Delirium protocol (if indicated): PRN fentanyl  VAP protocol (if indicated): Yes DVT prophylaxis: On anticoagulation currently on SCD GI prophylaxis: PPI Glucose control: n/a Mobility: Bedrest Code Status: Full Family Communication:  Per primary Disposition: ICU  Labs   CBC: Recent Labs  Lab 05/30/18 1010 05/30/18 1355 05/30/18 2027 05/31/18 0532 06/01/18 0417  WBC 6.1  --   --  10.7* 8.7  HGB 11.4* 10.9* 10.5* 10.3* 9.3*  HCT 36.8 32.0* 31.0* 33.0* 29.2*  MCV 92.0  --   --  90.4 90.1  PLT 158  --   --  155 145*    Basic Metabolic Panel: Recent Labs  Lab 05/30/18 1011 05/30/18 1355 05/30/18 2027 05/31/18 0532 05/31/18 0953 06/01/18 0417  NA  132* 135 134* 136  --  139  K 3.5 3.7 3.7 3.4*  --  3.4*  CL 100  --   --  109  --  110  CO2 16*  --   --  17*  --  20*  GLUCOSE 112*  --   --  122*  --  101*  BUN 21*  --   --  13  --  8  CREATININE 1.48*  --   --  0.56  --  0.60  CALCIUM 8.6*  --   --  7.8*  --  7.8*  MG 2.0  --   --   --  2.0  --    GFR: Estimated Creatinine Clearance: 79.2 mL/min (by C-G formula based on SCr of 0.6 mg/dL). Recent Labs  Lab 05/30/18 1010 05/30/18 1011 05/31/18 0532 06/01/18 0417  WBC 6.1  --  10.7* 8.7  LATICACIDVEN  --  2.0*  --   --     Liver Function Tests: Recent Labs  Lab 05/30/18 1107 06/01/18 0417  AST 425* 276*  ALT 76* 70*  ALKPHOS 90 96  BILITOT 0.7 1.3*  PROT 6.5 5.2*  ALBUMIN 3.2* 2.3*   Recent Labs  Lab 05/30/18 1107  LIPASE 27   No results for input(s): AMMONIA in the last 168 hours.  ABG    Component Value Date/Time   PHART 7.347 (L) 05/30/2018 2027   PCO2ART 35.9 05/30/2018 2027   PO2ART 369.0 (H) 05/30/2018 2027   HCO3 19.9 (L) 05/30/2018 2027   TCO2 21 (L) 05/30/2018 2027   ACIDBASEDEF 5.0 (H) 05/30/2018 2027   O2SAT 97.0 06/01/2018 0430     Coagulation Profile: Recent Labs  Lab 05/30/18 1011  INR 1.1    Cardiac Enzymes: Recent Labs  Lab 05/30/18 1011 05/30/18 1719 05/31/18 0034 05/31/18 0532  TROPONINI >65.00* >65.00* >65.00* >65.00*    HbA1C: Hgb A1c MFr Bld  Date/Time Value Ref Range Status  03/13/2011 05:14 AM 5.6 <5.7 % Final    Comment:    (NOTE)                                                                       According to the ADA Clinical Practice Recommendations for 2011, when HbA1c is used as a screening test:  >=6.5%   Diagnostic of Diabetes Mellitus           (if abnormal result is confirmed) 5.7-6.4%   Increased risk of developing Diabetes Mellitus References:Diagnosis and Classification of Diabetes Mellitus,Diabetes HBZJ,6967,89(FYBOF 1):S62-S69 and Standards of Medical Care in         Diabetes - 2011,Diabetes  BPZW,2585,27 (Suppl 1):S11-S61.    CBG: Recent Labs  Lab 05/30/18 2028 05/31/18 0003 05/31/18 0331 05/31/18 0834 05/31/18 1245  GLUCAP 116* 123* 125* 99 96    Critical Care Time:  31 minutes     The patient is critically ill with multiple organ systems failure and requires high complexity decision making for assessment and support, frequent evaluation and titration of therapies, application of advanced monitoring technologies and extensive interpretation of multiple databases.   Critical Care Time devoted to patient care services described in this note is 31 Minutes. This time reflects time of care of this signee Dr. Rodman Pickle. This critical care time does not reflect procedure time, or teaching time or supervisory time of PA/NP/Med student/Med Resident etc but could involve care discussion time.  Rodman Pickle, M.D. [facility] Pulmonary/Critical Care Medicine Pager: [phone] After hours pager: [phone]

## 2018-06-01 NOTE — Progress Notes (Signed)
Advanced Heart Failure Rounding Note   Subjective:    Admitted yesterday with CP and elevated troponin > 65. CT chest suggestive of possible dissection. Taken to OR but no dissection found on pre-op TEE. Moved back to cath lab and found to have heavily thrombosed RCA -> underwent successful PCI  Remains intubated this am but awake on vent and following commands. Agitated overnight. Wants tube out.   Off NE. Having low-grade temps   Swan numbers done personally at bedside with RN: RA 11 PA 27/18 (21) PCWP 12 Thermo 7.2/3.9   Objective:   Weight Range:  Vital Signs:   Temp:  [98.8 F (37.1 C)-101.3 F (38.5 C)] 98.8 F (37.1 C) (05/04 1618) Pulse Rate:  [58-106] 88 (05/04 1600) Resp:  [13-30] 17 (05/04 1600) BP: (101-135)/(55-97) 108/74 (05/04 1600) SpO2:  [98 %-100 %] 99 % (05/04 1600) Arterial Line BP: (63-144)/(48-119) 106/101 (05/04 0700) FiO2 (%):  [40 %] 40 % (05/04 0945) Last BM Date: (PTA)  Weight change: Filed Weights   05/30/18 1003  Weight: 72.6 kg    Intake/Output:   Intake/Output Summary (Last 24 hours) at 06/01/2018 1655 Last data filed at 06/01/2018 1500 Gross per 24 hour  Intake 992.14 ml  Output 1710 ml  Net -717.86 ml     Physical Exam: General:  Intubated. Awake and following commands. Agitated   HEENT: normal + ETT Neck: supple. JVP to jaw . Carotids 2+ bilat; no bruits. No lymphadenopathy or thryomegaly appreciated. Cor: PMI nondisplaced. Regular rate & rhythm. No rubs, gallops or murmurs. Lungs: clear Abdomen: soft, nontender, nondistended. No hepatosplenomegaly. No bruits or masses. Good bowel sounds. Extremities: no cyanosis, clubbing, rash, edema Neuro: awake on vent following commands   Telemetry: Sinus 80s Personally reviewed   Labs: Basic Metabolic Panel: Recent Labs  Lab 05/30/18 1011 05/30/18 1355 05/30/18 2027 05/31/18 0532 05/31/18 0953 06/01/18 0417  NA 132* 135 134* 136  --  139  K 3.5 3.7 3.7 3.4*  --  3.4*   CL 100  --   --  109  --  110  CO2 16*  --   --  17*  --  20*  GLUCOSE 112*  --   --  122*  --  101*  BUN 21*  --   --  13  --  8  CREATININE 1.48*  --   --  0.56  --  0.60  CALCIUM 8.6*  --   --  7.8*  --  7.8*  MG 2.0  --   --   --  2.0  --     Liver Function Tests: Recent Labs  Lab 05/30/18 1107 06/01/18 0417  AST 425* 276*  ALT 76* 70*  ALKPHOS 90 96  BILITOT 0.7 1.3*  PROT 6.5 5.2*  ALBUMIN 3.2* 2.3*   Recent Labs  Lab 05/30/18 1107  LIPASE 27   No results for input(s): AMMONIA in the last 168 hours.  CBC: Recent Labs  Lab 05/30/18 1010 05/30/18 1355 05/30/18 2027 05/31/18 0532 06/01/18 0417  WBC 6.1  --   --  10.7* 8.7  HGB 11.4* 10.9* 10.5* 10.3* 9.3*  HCT 36.8 32.0* 31.0* 33.0* 29.2*  MCV 92.0  --   --  90.4 90.1  PLT 158  --   --  155 145*    Cardiac Enzymes: Recent Labs  Lab 05/30/18 1011 05/30/18 1719 05/31/18 0034 05/31/18 0532  TROPONINI >65.00* >65.00* >65.00* >65.00*    BNP: BNP (last 3 results)  No results for input(s): BNP in the last 8760 hours.  ProBNP (last 3 results) No results for input(s): PROBNP in the last 8760 hours.    Other results:  Imaging: Dg Chest Port 1 View  Result Date: 06/01/2018 CLINICAL DATA:  Acute respiratory failure with hypoxia. EXAM: PORTABLE CHEST 1 VIEW COMPARISON:  Chest x-ray dated May 30, 2018. FINDINGS: Unchanged endotracheal and enteric tubes. Unchanged right internal jugular Swan-Ganz catheter with the tip in the region of the distal right interlobar pulmonary artery. The heart size and mediastinal contours are within normal limits. Normal pulmonary vascularity. Improved aeration at the lung bases. No focal consolidation, pleural effusion, or pneumothorax. No acute osseous abnormality. IMPRESSION: 1. Improved aeration at the lung bases. 2. Unchanged right internal jugular Swan-Ganz catheter with tip in the region of the distal right interlobar pulmonary artery. Consider retracting at least 4 cm.  Electronically Signed   By: Titus Dubin M.D.   On: 06/01/2018 07:44   Portable Chest X-ray  Result Date: 05/30/2018 CLINICAL DATA:  Intubation, history hypertension, lupus, smoker EXAM: PORTABLE CHEST 1 VIEW COMPARISON:  Portable exam 2030 hours compared to 05/30/2018 at 1015 hours FINDINGS: Tip of endotracheal tube projects 2.8 cm above carina. RIGHT jugular Swan-Ganz catheter with tip projecting over RIGHT lower lobe pulmonary artery. Nasogastric tube extends into stomach. Normal heart size, mediastinal contours, and pulmonary vascularity. Atherosclerotic calcification aorta. Bibasilar atelectasis greater on LEFT. No gross pleural effusion or pneumothorax. IMPRESSION: Line and tube positions as above. Bibasilar atelectasis greater on LEFT. Electronically Signed   By: Lavonia Dana M.D.   On: 05/30/2018 20:40   Dg Abd Portable 1v  Result Date: 05/30/2018 CLINICAL DATA:  OG tube placement EXAM: PORTABLE ABDOMEN - 1 VIEW COMPARISON:  None. FINDINGS: OG tube tip is in the mid to distal stomach. Nonobstructive bowel gas pattern. IMPRESSION: OG tube tip in the mid to distal stomach. Electronically Signed   By: Rolm Baptise M.D.   On: 05/30/2018 20:15     Medications:     Scheduled Medications: . [START ON 06/02/2018] aspirin  81 mg Oral Daily  . atorvastatin  80 mg Oral q1800  . chlorhexidine gluconate (MEDLINE KIT)  15 mL Mouth Rinse BID  . mouth rinse  15 mL Mouth Rinse QID  . [START ON 06/02/2018] multivitamin with minerals  1 tablet Oral Daily  . [START ON 06/02/2018] mycophenolate  1,500 mg Oral BID  . [START ON 06/02/2018] pantoprazole  40 mg Oral Daily  . sodium chloride flush  3 mL Intravenous Q12H  . ticagrelor  90 mg Oral BID    Infusions: . sodium chloride 1,000 mL (05/31/18 1805)  . norepinephrine (LEVOPHED) Adult infusion Stopped (06/01/18 1317)    PRN Medications:  sodium chloride, lip balm, nitroGLYCERIN, ondansetron (ZOFRAN) IV, sodium chloride flush   Diagnostic  Dominance:  Right    Intervention       Assessment:   58 y/o woman with SLE and HTN 5/2 with CP and elevated troponin > 65. CT chest suggestive of possible dissection. Taken to OR but no dissection found on pre-op TEE. Moved back to cath lab and found to have heavily thrombosed RCA -> underwent successful PCI  Plan/Discussion:    1. CAD with inferior STEMI c/b RV infarct & cardiogenic shock - s/p PCI/DES of RCA x 2 on 5/2 with heavy clot burden requiring clot evacuation and aggrastat - peak trop > 70 - Swan numbers improved but still with evidence of RV dysfunction. CO normalized. PCWP  low. Should be ok to extubated - No b-blocker or dig with heart block.  - Echo  45% with moderate to severe RV dysfunction - minimal CAD in LAD/LCX - continue ASA/Brilinta, statin. No b-blocker with bradycardia and Wenckebach yesterday. May be able to start tomorrow  - D/w interventional team. Will not need re-look cath  2. Acute hypoxic respiratory failure - d/w CCM at bedside. Much improved. Should be able to extubate today -   3. Abnormality on chest CT - question hematoma around aortic arch.  - reviewed with Dr. Servando Snare and initially went to OR but surgery cancelled as no dissection on TEE - d/w Dr. Servando Snare. Remains unclear about what CT finding represents in setting of SLE. Will need repeat imaging to ensure stability -doubt acute dissection  4. 2nd degree heart block, Type I (Wenckebach) - continue to follow as above  5. SLE - ANA and ds-DNA pending - Cellcept currently on hold   6. Tobacco use - can use nicotine patch as needed  7. Hypokalemia - k 3.4 supp K  CRITICAL CARE Performed by: Glori Bickers  Total critical care time: 35 minutes  Critical care time was exclusive of separately billable procedures and treating other patients.  Critical care was necessary to treat or prevent imminent or life-threatening deterioration.  Critical care was time spent personally by me  (independent of midlevel providers or residents) on the following activities: development of treatment plan with patient and/or surrogate as well as nursing, discussions with consultants, evaluation of patient's response to treatment, examination of patient, obtaining history from patient or surrogate, ordering and performing treatments and interventions, ordering and review of laboratory studies, ordering and review of radiographic studies, pulse oximetry and re-evaluation of patient's condition.    Length of Stay: 2   Glori Bickers MD 06/01/2018, 4:55 PM  Advanced Heart Failure Team Pager [phone] (M-F; 7a - 4p)  Please contact [facility] for night-coverage after hours (4p -7a ) and weekends on [email or web address]

## 2018-06-01 NOTE — Progress Notes (Signed)
CCU PM Round: Pt doing well. Extubated this morning. Breathing comfortable. Hemodynamics stable.  Plan: continue progression. Possible tx out of CCU tomorrow. D/W Dr Servando Snare earlier today - plan repeat aortic CTA prior to discharge home.  Sherren Mocha 06/01/2018 6:26 PM

## 2018-06-01 NOTE — Progress Notes (Signed)
5/4 Assisted tele visit to patient with sister, Silva Bandy.  Maryelizabeth Rowan, RN

## 2018-06-01 NOTE — Progress Notes (Signed)
[facility] ADULT ICU REPLACEMENT PROTOCOL FOR AM LAB REPLACEMENT ONLY  The patient does apply for the [facility] Adult ICU Electrolyte Replacment Protocol based on the criteria listed below:   1. Is GFR >/= 40 ml/min? Yes.    Patient's GFR today is >60 2. Is urine output >/= 0.5 ml/kg/hr for the last 6 hours? Yes.   Patient's UOP is 0.9 ml/kg/hr 3. Is BUN < 60 mg/dL? Yes.    Patient's BUN today is 8 4. Abnormal electrolyte(s): K 3.4 5. Ordered repletion with: protocol 6. If a panic level lab has been reported, has the CCM MD in charge been notified? No..   Physician:    Ronda Fairly A 06/01/2018 5:31 AM

## 2018-06-01 NOTE — Progress Notes (Signed)
Patient c/o severe pain in back radiating all the way up to her neck.  States pain is a 8/10 and writhing in bed and crying.  Dr. Haroldine Laws and Dr. Loanne Drilling both in unit and came to see patient.  Will give 1mg  IV Morphine for pain.

## 2018-06-02 ENCOUNTER — Encounter (HOSPITAL_COMMUNITY): Payer: Self-pay | Admitting: Cardiothoracic Surgery

## 2018-06-02 DIAGNOSIS — E876 Hypokalemia: Secondary | ICD-10-CM

## 2018-06-02 DIAGNOSIS — I441 Atrioventricular block, second degree: Secondary | ICD-10-CM

## 2018-06-02 LAB — BPAM RBC
Blood Product Expiration Date: 202005052359
Blood Product Expiration Date: 202005052359
Blood Product Expiration Date: 202005062359
Blood Product Expiration Date: 202005062359
ISSUE DATE / TIME: 202005021335
ISSUE DATE / TIME: 202005021335
ISSUE DATE / TIME: 202005041247
ISSUE DATE / TIME: 202005041503
Unit Type and Rh: 6200
Unit Type and Rh: 6200
Unit Type and Rh: 6200
Unit Type and Rh: 6200

## 2018-06-02 LAB — TYPE AND SCREEN
ABO/RH(D): A POS
Antibody Screen: NEGATIVE
Unit division: 0
Unit division: 0
Unit division: 0
Unit division: 0

## 2018-06-02 LAB — BASIC METABOLIC PANEL
Anion gap: 11 (ref 5–15)
Anion gap: 8 (ref 5–15)
BUN: <5 mg/dL — ABNORMAL LOW (ref 6–20)
BUN: 5 mg/dL — ABNORMAL LOW (ref 6–20)
CO2: 21 mmol/L — ABNORMAL LOW (ref 22–32)
CO2: 24 mmol/L (ref 22–32)
Calcium: 8.3 mg/dL — ABNORMAL LOW (ref 8.9–10.3)
Calcium: 8.1 mg/dL — ABNORMAL LOW (ref 8.9–10.3)
Chloride: 108 mmol/L (ref 98–111)
Chloride: 110 mmol/L (ref 98–111)
Creatinine, Ser: 0.63 mg/dL (ref 0.44–1.00)
Creatinine, Ser: 0.59 mg/dL (ref 0.44–1.00)
GFR calc Af Amer: >60 mL/min (ref >60)
GFR calc Af Amer: >60 mL/min (ref >60)
GFR calc non Af Amer: >60 mL/min (ref >60)
GFR calc non Af Amer: >60 mL/min (ref >60)
Glucose, Bld: 72 mg/dL (ref 70–99)
Glucose, Bld: 101 mg/dL — ABNORMAL HIGH (ref 70–99)
Potassium: 3.2 mmol/L — ABNORMAL LOW (ref 3.5–5.1)
Potassium: 3.8 mmol/L (ref 3.5–5.1)
Sodium: 140 mmol/L (ref 135–145)
Sodium: 142 mmol/L (ref 135–145)

## 2018-06-02 LAB — ENA+DNA/DS+ANTICH+CENTRO+JO...
Anti JO-1: <0.2 AI (ref 0.0–0.9)
Centromere Ab Screen: <0.2 AI (ref 0.0–0.9)
Chromatin Ab SerPl-aCnc: 1.1 AI — ABNORMAL HIGH (ref 0.0–0.9)
ENA SM Ab Ser-aCnc: <0.2 AI (ref 0.0–0.9)
Ribonucleic Protein: 1.4 AI — ABNORMAL HIGH (ref 0.0–0.9)
SSA (Ro) (ENA) Antibody, IgG: <0.2 AI (ref 0.0–0.9)
SSB (La) (ENA) Antibody, IgG: <0.2 AI (ref 0.0–0.9)
Scleroderma (Scl-70) (ENA) Antibody, IgG: 0.6 AI (ref 0.0–0.9)
ds DNA Ab: 6 IU/mL (ref 0–9)

## 2018-06-02 LAB — CBC
HCT: 30.2 % — ABNORMAL LOW (ref 36.0–46.0)
Hemoglobin: 9.7 g/dL — ABNORMAL LOW (ref 12.0–15.0)
MCH: 28.8 pg (ref 26.0–34.0)
MCHC: 32.1 g/dL (ref 30.0–36.0)
MCV: 89.6 fL (ref 80.0–100.0)
Platelets: 150 10*3/uL (ref 150–400)
RBC: 3.37 MIL/uL — ABNORMAL LOW (ref 3.87–5.11)
RDW: 13.6 % (ref 11.5–15.5)
WBC: 5.8 10*3/uL (ref 4.0–10.5)
nRBC: 0 % (ref 0.0–0.2)

## 2018-06-02 LAB — COOXEMETRY PANEL
Carboxyhemoglobin: 1.3 % (ref 0.5–1.5)
Methemoglobin: 2.2 % — ABNORMAL HIGH (ref 0.0–1.5)
O2 Saturation: 67.7 %
Total hemoglobin: 9 g/dL — ABNORMAL LOW (ref 12.0–16.0)

## 2018-06-02 LAB — ANA W/REFLEX IF POSITIVE: Anti Nuclear Antibody (ANA): POSITIVE — AB

## 2018-06-02 LAB — ANTI-DNA ANTIBODY, DOUBLE-STRANDED: ds DNA Ab: 7 IU/mL (ref 0–9)

## 2018-06-02 MED ORDER — PANTOPRAZOLE SODIUM 40 MG PO TBEC: 40.0000 mg | DELAYED_RELEASE_TABLET | Freq: Every day | ORAL | Status: DC

## 2018-06-02 MED ORDER — IBUPROFEN 100 MG PO CHEW
100.0000 mg | CHEWABLE_TABLET | Freq: Three times a day (TID) | ORAL | Status: DC | PRN
  Filled 2018-06-02: qty 1

## 2018-06-02 MED ORDER — POTASSIUM CHLORIDE CRYS ER 20 MEQ PO TBCR
40.0000 meq | EXTENDED_RELEASE_TABLET | Freq: Two times a day (BID) | ORAL | Status: DC
  Administered 2018-06-03: 40 meq via ORAL
  Filled 2018-06-02: qty 2

## 2018-06-02 MED ORDER — METOPROLOL TARTRATE 12.5 MG HALF TABLET: 12.5000 mg | ORAL_TABLET | Freq: Two times a day (BID) | ORAL | Status: DC

## 2018-06-02 MED ORDER — ENOXAPARIN SODIUM 40 MG/0.4ML ~~LOC~~ SOLN
40.0000 mg | SUBCUTANEOUS | Status: DC
  Administered 2018-06-02 – 2018-06-03 (×2): 40 mg via SUBCUTANEOUS
  Filled 2018-06-02 (×2): qty 0.4

## 2018-06-02 MED ORDER — METOPROLOL TARTRATE 25 MG PO TABS
25.0000 mg | ORAL_TABLET | Freq: Two times a day (BID) | ORAL | Status: DC
  Administered 2018-06-02 – 2018-06-03 (×3): 25 mg via ORAL
  Filled 2018-06-02 (×3): qty 1

## 2018-06-02 MED ORDER — SPIRONOLACTONE 12.5 MG HALF TABLET
12.5000 mg | ORAL_TABLET | Freq: Every day | ORAL | Status: DC
  Administered 2018-06-02 – 2018-06-03 (×2): 12.5 mg via ORAL
  Filled 2018-06-02 (×2): qty 1

## 2018-06-02 MED ORDER — POTASSIUM CHLORIDE CRYS ER 20 MEQ PO TBCR
30.0000 meq | EXTENDED_RELEASE_TABLET | ORAL | Status: DC
  Administered 2018-06-02 (×2): 30 meq via ORAL
  Filled 2018-06-02 (×2): qty 1

## 2018-06-02 NOTE — Progress Notes (Signed)
Attempted to call report. RN to call back. 

## 2018-06-02 NOTE — Evaluation (Signed)
Occupational Therapy Evaluation Patient Details Name: Samantha [name] MRN: [id number] DOB: 06-Apr-1960 Today's Date: 06/02/2018    History of Present Illness Pt is a 58 y.o. female admitted 05/30/18 with inferior wall MI, complicated by RV infarct and cardiogenic shock. S/p PCI 5/2.  S/p transesophageal echo 5/4. ETT 5/2-5/4. COVID testing (-). PMH includes HTN, lupus, tobacco abuse.   Clinical Impression   Pt was independent for PLOF. 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 Today Pt was able to participate in transfers at min guard with no DME, sink level grooming at min guard, but was mod A for LB ADL due to back pain. OT will follow acutely for AE education for LB ADL and educate on back precautions for long term pain management with No OT follow up post-acute  OF NOTE: OT acknowledges the MD discontinue note, but it was put in after the evaluation had been performed.     Follow Up Recommendations  No OT follow up    Equipment Recommendations  None recommended by OT    Recommendations for Other Services       Precautions / Restrictions Restrictions Weight Bearing Restrictions: No      Mobility Bed Mobility Overal bed mobility: Modified Independent             General bed mobility comments: increased time and effort, use of bed rails  Transfers Overall transfer level: Needs assistance Equipment used: None Transfers: Sit to/from Stand Sit to Stand: Min guard         General transfer comment: min guard for safety    Balance Overall balance assessment: Mild deficits observed, not formally tested                                         ADL either performed or assessed with clinical judgement   ADL Overall ADL's : Needs assistance/impaired Eating/Feeding: Independent   Grooming: Wash/dry hands;Wash/dry face;Oral care;Supervision/safety;Standing Grooming Details (indicate cue type and reason): sink level Upper Body  Bathing: Set up;Sitting   Lower Body Bathing: Moderate assistance;Sitting/lateral leans Lower Body Bathing Details (indicate cue type and reason): unable to perform figure 4 or access below shins Upper Body Dressing : Minimal assistance   Lower Body Dressing: Moderate assistance;Sit to/from stand Lower Body Dressing Details (indicate cue type and reason): unable to perform figure 4 or access below mid-shin due to lower back pain Toilet Transfer: Min guard;Ambulation   Toileting- Clothing Manipulation and Hygiene: Supervision/safety;Sitting/lateral lean   Tub/ Shower Transfer: Tub transfer;Min guard;Ambulation Tub/Shower Transfer Details (indicate cue type and reason): simulated in room Functional mobility during ADLs: Min guard General ADL Comments: decreased access to LB due to back pain     Vision Patient Visual Report: No change from baseline       Perception     Praxis      Pertinent Vitals/Pain Pain Assessment: 0-10 Pain Score: 7  Pain Location: lower back Pain Descriptors / Indicators: Aching;Discomfort;Constant Pain Intervention(s): Monitored during session;Repositioned;Heat applied     Hand Dominance Right   Extremity/Trunk Assessment Upper Extremity Assessment Upper Extremity Assessment: Overall WFL for tasks assessed   Lower Extremity Assessment Lower Extremity Assessment: Defer to PT evaluation       Communication Communication Communication: No difficulties   Cognition Arousal/Alertness: Awake/alert Behavior During Therapy: WFL for tasks assessed/performed Overall Cognitive Status: Within Functional Limits for tasks assessed  General Comments  VSS throughout session, HR max 111    Exercises     Shoulder Instructions      Home Living Family/patient expects to be discharged to:: Private residence Living Arrangements: Parent(caregiver for mother with dementia) Available Help at Discharge:  Family;Other (Comment)(church members/friends) Type of Home: House Home Access: Stairs to enter [employer] of Steps: 3   Home Layout: One level     Bathroom Shower/Tub: Teacher, early years/pre: Handicapped height Bathroom Accessibility: Yes How Accessible: Accessible via walker Home Equipment: Tub bench          Prior Functioning/Environment Level of Independence: Independent        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[employer], 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        OT Problem List: Decreased range of motion;Decreased activity tolerance;Impaired balance (sitting and/or standing);Cardiopulmonary status limiting activity;Pain      OT Treatment/Interventions: DME and/or AE instruction;Self-care/ADL training;Therapeutic activities;Patient/family education;Balance training    OT Goals(Current goals can be found in the care plan section) Acute Rehab OT Goals Patient Stated Goal: get home to her grandbabies OT Goal Formulation: With patient Time For Goal Achievement: 06/16/18 Potential to Achieve Goals: Good ADL Goals Pt Will Perform Lower Body Bathing: with modified independence;with adaptive equipment;sitting/lateral leans Pt Will Perform Lower Body Dressing: with modified independence;with adaptive equipment;sit to/from stand Additional ADL Goal #1: Pt will verbalize and maintain back precautions as a way to decrease pain and increased independence in ADL at independent level  OT Frequency: Min 2X/week   Barriers to D/C:            Co-evaluation              AM-PAC OT "6 Clicks" Daily Activity     Outcome Measure Help from another person eating meals?: None Help from another person taking care of personal grooming?: None Help from another person toileting, which includes using toliet, bedpan, or urinal?: A Little Help from another person bathing (including washing, rinsing, drying)?: A Little Help from another person to put on and taking off  regular upper body clothing?: None Help from another person to put on and taking off regular lower body clothing?: A Lot 6 Click Score: 20   End of Session Equipment Utilized During Treatment: Gait belt Nurse Communication: Mobility status  Activity Tolerance: Patient tolerated treatment well Patient left: in bed;with call bell/phone within reach  OT Visit Diagnosis: Other abnormalities of gait and mobility (R26.89);Muscle weakness (generalized) (M62.81)                Time: 1657-903 OT Time Calculation (min): 16 min Charges:  OT General Charges $OT Visit: 1 Visit OT Evaluation $OT Eval Moderate Complexity: Dunn OTR/L Acute Rehabilitation Services Pager: [phone] Office: [place] 06/02/2018, 10:46 AM

## 2018-06-02 NOTE — Progress Notes (Addendum)
Advanced Heart Failure Rounding Note   Subjective:    Complaining of some CP around her ECG electrode. No further back pain. Luiz Blare out. Denies dyspnea. Co-ox 68% this am    Objective:   Weight Range:  Vital Signs:   Temp:  [98.4 F (36.9 C)-99.5 F (37.5 C)] 98.6 F (37 C) (05/05 1140) Pulse Rate:  [87-103] 92 (05/05 1300) Resp:  [14-24] 19 (05/05 1300) BP: (92-136)/(58-97) 115/86 (05/05 1300) SpO2:  [87 %-100 %] 100 % (05/05 1300) Weight:  [74.6 kg] 74.6 kg (05/05 0500) Last BM Date: (PTA)  Weight change: Filed Weights   05/30/18 1003 06/02/18 0500  Weight: 72.6 kg 74.6 kg    Intake/Output:   Intake/Output Summary (Last 24 hours) at 06/02/2018 1405 Last data filed at 06/02/2018 1200 Gross per 24 hour  Intake 756.93 ml  Output 950 ml  Net -193.07 ml     Physical Exam: General:  Well appearing. No resp difficulty HEENT: normal Neck: supple. JVP 9-10. Carotids 2+ bilat; no bruits. No lymphadenopathy or thryomegaly appreciated. Cor: PMI nondisplaced. Regular rate & rhythm. No rubs, gallops or murmurs. Lungs: clear Abdomen: soft, nontender, nondistended. No hepatosplenomegaly. No bruits or masses. Good bowel sounds. Extremities: no cyanosis, clubbing, rash, edema Neuro: alert & orientedx3, cranial nerves grossly intact. moves all 4 extremities w/o difficulty. Affect pleasant    Telemetry: Sinus 80-90s Personally reviewed   Labs: Basic Metabolic Panel: Recent Labs  Lab 05/30/18 1011 05/30/18 1355 05/30/18 2027 05/31/18 0532 05/31/18 0953 06/01/18 0417 06/02/18 0237  NA 132* 135 134* 136  --  139 140  K 3.5 3.7 3.7 3.4*  --  3.4* 3.2*  CL 100  --   --  109  --  110 108  CO2 16*  --   --  17*  --  20* 21*  GLUCOSE 112*  --   --  122*  --  101* 72  BUN 21*  --   --  13  --  8 <5*  CREATININE 1.48*  --   --  0.56  --  0.60 0.63  CALCIUM 8.6*  --   --  7.8*  --  7.8* 8.3*  MG 2.0  --   --   --  2.0  --   --     Liver Function Tests: Recent Labs   Lab 05/30/18 1107 06/01/18 0417  AST 425* 276*  ALT 76* 70*  ALKPHOS 90 96  BILITOT 0.7 1.3*  PROT 6.5 5.2*  ALBUMIN 3.2* 2.3*   Recent Labs  Lab 05/30/18 1107  LIPASE 27   No results for input(s): AMMONIA in the last 168 hours.  CBC: Recent Labs  Lab 05/30/18 1010 05/30/18 1355 05/30/18 2027 05/31/18 0532 06/01/18 0417 06/02/18 0237  WBC 6.1  --   --  10.7* 8.7 5.8  HGB 11.4* 10.9* 10.5* 10.3* 9.3* 9.7*  HCT 36.8 32.0* 31.0* 33.0* 29.2* 30.2*  MCV 92.0  --   --  90.4 90.1 89.6  PLT 158  --   --  155 145* 150    Cardiac Enzymes: Recent Labs  Lab 05/30/18 1011 05/30/18 1719 05/31/18 0034 05/31/18 0532  TROPONINI >65.00* >65.00* >65.00* >65.00*    BNP: BNP (last 3 results) No results for input(s): BNP in the last 8760 hours.  ProBNP (last 3 results) No results for input(s): PROBNP in the last 8760 hours.    Other results:  Imaging: Dg Chest Port 1 View  Result Date: 06/01/2018 CLINICAL DATA:  Acute respiratory failure with hypoxia. EXAM: PORTABLE CHEST 1 VIEW COMPARISON:  Chest x-ray dated May 30, 2018. FINDINGS: Unchanged endotracheal and enteric tubes. Unchanged right internal jugular Swan-Ganz catheter with the tip in the region of the distal right interlobar pulmonary artery. The heart size and mediastinal contours are within normal limits. Normal pulmonary vascularity. Improved aeration at the lung bases. No focal consolidation, pleural effusion, or pneumothorax. No acute osseous abnormality. IMPRESSION: 1. Improved aeration at the lung bases. 2. Unchanged right internal jugular Swan-Ganz catheter with tip in the region of the distal right interlobar pulmonary artery. Consider retracting at least 4 cm. Electronically Signed   By: Titus Dubin M.D.   On: 06/01/2018 07:44     Medications:     Scheduled Medications: . aspirin  81 mg Oral Daily  . atorvastatin  80 mg Oral q1800  . enoxaparin (LOVENOX) injection  40 mg Subcutaneous Q24H  .  metoprolol tartrate  25 mg Oral BID  . multivitamin with minerals  1 tablet Oral Daily  . mycophenolate  1,500 mg Oral BID  . pantoprazole  40 mg Oral Daily  . ticagrelor  90 mg Oral BID    Infusions:   PRN Medications:  cyclobenzaprine, lip balm, nitroGLYCERIN, ondansetron (ZOFRAN) IV   Diagnostic  Dominance: Right    Intervention       Assessment:   58 y/o woman with SLE and HTN 5/2 with CP and elevated troponin > 65. CT chest suggestive of possible dissection. Taken to OR but no dissection found on pre-op TEE. Moved back to cath lab and found to have heavily thrombosed RCA -> underwent successful PCI  Plan/Discussion:    1. CAD with inferior STEMI c/b RV infarct & cardiogenic shock - s/p PCI/DES of RCA x 2 on 5/2 with heavy clot burden requiring clot evacuation and aggrastat - peak trop > 65 - Echo  45% with moderate to severe RV dysfunction - minimal CAD in LAD/LCX - continue ASA/Brilinta, statin. B-blocker had been on hold with bradycardia and Wenckebach yesterday. This has reolved. Can start today - start spiro. Watch closely for diuretic requirement - consider low-dose ARB in am if BP tolerates.   2. Acute hypoxic respiratory failure - resolved    3. Abnormality on chest CT - question hematoma around aortic arch.  - reviewed with Dr. Servando Snare and initially went to OR but surgery cancelled as no dissection on TEE - d/w Dr. Servando Snare. Remains unclear about what CT finding represents in setting of SLE. Will repeat today  4. 2nd degree heart block, Type I (Wenckebach) - resolved  5. SLE - ANA and ds-DNA pending - Cellcept currently on hold. Can restart soon   6. Tobacco use - can use nicotine patch as needed  7. Hypokalemia - k 3.2 supp K and add low-dose spiro   AHF team will sign off. Please call with questions.   Glori Bickers MD 06/02/2018, 2:05 PM  Advanced Heart Failure Team Pager [phone] (M-F; 7a - 4p)  Please contact [facility] for  night-coverage after hours (4p -7a ) and weekends on [email or web address]

## 2018-06-02 NOTE — Progress Notes (Signed)
[facility] ADULT ICU REPLACEMENT PROTOCOL FOR AM LAB REPLACEMENT ONLY  The patient does apply for the [facility] Adult ICU Electrolyte Replacment Protocol based on the criteria listed below:   1. Is GFR >/= 40 ml/min? Yes.    Patient's GFR today is >60 2. Is urine output >/= 0.5 ml/kg/hr for the last 6 hours? Yes.   Patient's UOP is 1.2 ml/kg/hr 3. Is BUN < 60 mg/dL? Yes.    Patient's BUN today is <5 4. Abnormal electrolyte(s): K 3.2 5. Ordered repletion with: protocol 6. If a panic level lab has been reported, has the CCM MD in charge been notified? No..   Physician:    Ronda Fairly A 06/02/2018 4:54 AM

## 2018-06-02 NOTE — Progress Notes (Addendum)
NAME:  Samantha [name], MRN:  [id number], DOB:  1960-05-08, LOS: 3 ADMISSION DATE:  05/30/2018, CONSULTATION DATE: 05/30/2018 REFERRING MD: Angelena Form, CHIEF COMPLAINT:  Chest and abdominal pain   Brief History   58 y/o F who presented 5/2 pm with complaints of chest and abdominal pain. Found to be hypotensive with ST changes on EKG.  Ruled out for aortic dissection but found to have RCA lesion requiring stent. CT imaging concerning for probable hematoma around the aortic arch but was not seen on TEE in the OR.    Past Medical History  Lupus Tobacco Abuse MI  Significant Hospital Events   5/02 Admit with chest/abd pain. RCA lesion stented  Consults:  CVTS   Procedures:  ETT 5/2 >>  R IJ Cordis/Swan 5/2 >>   Significant Diagnostic Tests:  CTA Chest 5/2 >> new wall thickening in the ascending thoracic aorta compared to 02/2018, concern for acute aortic syndrome due to type A intramural hematoma LHC 5/2 >> acute inferior STEMI 2/2 thrombotic occlusion of the proximal RCA   Micro Data:  COVID 5/2 >> negative   Antimicrobials:     Interim history/subjective:  Extubated yesterday. On RA. Primary team will transfer to floor today  Objective   Blood pressure 127/77, pulse 93, temperature 99.3 F (37.4 C), temperature source Oral, resp. rate (!) 24, height 5\' 8"  (1.727 m), weight 74.6 kg, last menstrual period 04/11/2011, SpO2 100 %. PAP: (18-23)/(11-16) 23/12 CVP:  [0 mmHg-13 mmHg] 9 mmHg  Vent Mode: PSV;CPAP FiO2 (%):  [40 %] 40 % PEEP:  [5 cmH20] 5 cmH20 Pressure Support:  [5 cmH20] 5 cmH20   Intake/Output Summary (Last 24 hours) at 06/02/2018 0917 Last data filed at 06/02/2018 0600 Gross per 24 hour  Intake 557.01 ml  Output 1400 ml  Net -842.99 ml   Filed Weights   05/30/18 1003 06/02/18 0500  Weight: 72.6 kg 74.6 kg   Physical Exam: General: Well-appearing, no acute distress HENT: [place], AT, OP clear, MMM Eyes: EOMI, no scleral icterus Respiratory: Clear to  auscultation bilaterally.  No crackles, wheezing or rales Cardiovascular: RRR, -M/R/G, no JVD GI: BS+, soft, nontender Extremities:-Edema,-tenderness Neuro: AAO x4, CNII-XII grossly intact Psych: Normal mood, normal affect  Resolved Hospital Problem list   AKI Post-op fever  Assessment & Plan:   Acute Respiratory Insufficiency - resolved COVID-negative Extubated to RA P: Monitor vitals  Cardiogenic shock secondary to inferior STEMI S/p DES x 2 to RCA on 5/2. Loaded with Brilinta P: Off levophed Daily ASA, statin and Brilinta Metoprolol per Cards/Primary  [place] I Block  P: Telemetry  Questionable Aortic Arch Hematoma  -not seen on TEE P: Per CVTS > plan for repeat CTA imaging prior to discharge   Hypokalemia P: Replete Recheck BMP this afternoon  Lupus  -on cellcept at home P: Restart home cellcept   Back pain P: PRN Flexeril and ibuprofen  Anemia  P: Trend CBC  Transfuse per ICU guidelines  Pulmonary will sign off. Please call if needed.  Best practice:  Diet: Heart diet Pain/Anxiety/Delirium protocol (if indicated): Not indicated VAP protocol (if indicated): No DVT prophylaxis: Lovenox  GI prophylaxis: PPI Glucose control: n/a Mobility: OOB as tolerated Code Status: Full Family Communication: Per primary Disposition: ICU  Labs   CBC: Recent Labs  Lab 05/30/18 1010 05/30/18 1355 05/30/18 2027 05/31/18 0532 06/01/18 0417 06/02/18 0237  WBC 6.1  --   --  10.7* 8.7 5.8  HGB 11.4* 10.9* 10.5* 10.3* 9.3* 9.7*  HCT  36.8 32.0* 31.0* 33.0* 29.2* 30.2*  MCV 92.0  --   --  90.4 90.1 89.6  PLT 158  --   --  155 145* 812    Basic Metabolic Panel: Recent Labs  Lab 05/30/18 1011 05/30/18 1355 05/30/18 2027 05/31/18 0532 05/31/18 0953 06/01/18 0417 06/02/18 0237  NA 132* 135 134* 136  --  139 140  K 3.5 3.7 3.7 3.4*  --  3.4* 3.2*  CL 100  --   --  109  --  110 108  CO2 16*  --   --  17*  --  20* 21*  GLUCOSE 112*  --   --  122*  --   101* 72  BUN 21*  --   --  13  --  8 <5*  CREATININE 1.48*  --   --  0.56  --  0.60 0.63  CALCIUM 8.6*  --   --  7.8*  --  7.8* 8.3*  MG 2.0  --   --   --  2.0  --   --    GFR: Estimated Creatinine Clearance: 79.2 mL/min (by C-G formula based on SCr of 0.63 mg/dL). Recent Labs  Lab 05/30/18 1010 05/30/18 1011 05/31/18 0532 06/01/18 0417 06/02/18 0237  WBC 6.1  --  10.7* 8.7 5.8  LATICACIDVEN  --  2.0*  --   --   --     Liver Function Tests: Recent Labs  Lab 05/30/18 1107 06/01/18 0417  AST 425* 276*  ALT 76* 70*  ALKPHOS 90 96  BILITOT 0.7 1.3*  PROT 6.5 5.2*  ALBUMIN 3.2* 2.3*   Recent Labs  Lab 05/30/18 1107  LIPASE 27   No results for input(s): AMMONIA in the last 168 hours.  ABG    Component Value Date/Time   PHART 7.347 (L) 05/30/2018 2027   PCO2ART 35.9 05/30/2018 2027   PO2ART 369.0 (H) 05/30/2018 2027   HCO3 19.9 (L) 05/30/2018 2027   TCO2 21 (L) 05/30/2018 2027   ACIDBASEDEF 5.0 (H) 05/30/2018 2027   O2SAT 67.7 06/02/2018 0415     Coagulation Profile: Recent Labs  Lab 05/30/18 1011  INR 1.1    Cardiac Enzymes: Recent Labs  Lab 05/30/18 1011 05/30/18 1719 05/31/18 0034 05/31/18 0532  TROPONINI >65.00* >65.00* >65.00* >65.00*    HbA1C: Hgb A1c MFr Bld  Date/Time Value Ref Range Status  03/13/2011 05:14 AM 5.6 <5.7 % Final    Comment:    (NOTE)                                                                       According to the ADA Clinical Practice Recommendations for 2011, when HbA1c is used as a screening test:  >=6.5%   Diagnostic of Diabetes Mellitus           (if abnormal result is confirmed) 5.7-6.4%   Increased risk of developing Diabetes Mellitus References:Diagnosis and Classification of Diabetes Mellitus,Diabetes XNTZ,0017,49(SWHQP 1):S62-S69 and Standards of Medical Care in         Diabetes - 2011,Diabetes RFFM,3846,65 (Suppl 1):S11-S61.    CBG: Recent Labs  Lab 05/31/18 0003 05/31/18 0331 05/31/18 0834  05/31/18 1245 06/01/18 2354  GLUCAP 123* 125* 99 96 66*  Care Time devoted to patient care services described in this note is 35 Minutes. This time reflects time of care of this signee Dr. Rodman Pickle. This critical care time does not reflect procedure time, or teaching time or supervisory time of PA/NP/Med student/Med Resident etc but could involve care discussion time. Reconciled meds. Discussed plan with pharmacy and bedside RN and Primary Cardiology team.  Rodman Pickle, M.D. [facility] Pulmonary/Critical Care Medicine Pager: [phone] After hours pager: [phone]

## 2018-06-02 NOTE — Progress Notes (Addendum)
Progress Note  Patient Name: Samantha [name] Date of Encounter: 06/02/2018  Primary Cardiologist: No primary care provider on file.   Subjective   Feeling much better today.  Patient is hungry.  Denies chest pain or shortness of breath.  Complaining of soreness in her lower back.  Inpatient Medications    Scheduled Meds:  aspirin  81 mg Oral Daily   atorvastatin  80 mg Oral q1800   chlorhexidine gluconate (MEDLINE KIT)  15 mL Mouth Rinse BID   mouth rinse  15 mL Mouth Rinse QID   multivitamin with minerals  1 tablet Oral Daily   mycophenolate  1,500 mg Oral BID   pantoprazole  40 mg Oral Daily   potassium chloride  30 mEq Oral Q4H   sodium chloride flush  3 mL Intravenous Q12H   ticagrelor  90 mg Oral BID   Continuous Infusions:  sodium chloride 10 mL/hr at 06/02/18 0600   norepinephrine (LEVOPHED) Adult infusion Stopped (06/01/18 1317)   PRN Meds: sodium chloride, cyclobenzaprine, ibuprofen, lip balm, nitroGLYCERIN, ondansetron (ZOFRAN) IV, sodium chloride flush   Vital Signs    Vitals:   06/02/18 0300 06/02/18 0400 06/02/18 0500 06/02/18 0600  BP: 120/70 121/73 127/77   Pulse: 92 95 87 93  Resp: 17 19 (!) 21 (!) 24  Temp:  99.3 F (37.4 C)    TempSrc:  Oral    SpO2: 100% 100% 100% 100%  Weight:   74.6 kg   Height:        Intake/Output Summary (Last 24 hours) at 06/02/2018 0845 Last data filed at 06/02/2018 0600 Gross per 24 hour  Intake 616.47 ml  Output 1400 ml  Net -783.53 ml   Last 3 Weights 06/02/2018 05/30/2018 04/20/2018  Weight (lbs) 164 lb 7.4 oz 160 lb 162 lb  Weight (kg) 74.6 kg 72.576 kg 73.483 kg      Telemetry    Sinus rhythm no arrhythmia- Personally Reviewed   Physical Exam  Alert, oriented woman in no distress GEN: No acute distress.   Neck: No JVD Cardiac: RRR, no murmurs, rubs, or gallops.  Respiratory:  Diminished breath sounds in the bases bilaterally. GI: Soft, nontender, non-distended  MS: No edema; No  deformity. Neuro:  Nonfocal  Psych: Normal affect   Labs    Chemistry Recent Labs  Lab 05/30/18 1107  05/31/18 0532 06/01/18 0417 06/02/18 0237  NA  --    < > 136 139 140  K  --    < > 3.4* 3.4* 3.2*  CL  --   --  109 110 108  CO2  --   --  17* 20* 21*  GLUCOSE  --   --  122* 101* 72  BUN  --   --  13 8 <5*  CREATININE  --   --  0.56 0.60 0.63  CALCIUM  --   --  7.8* 7.8* 8.3*  PROT 6.5  --   --  5.2*  --   ALBUMIN 3.2*  --   --  2.3*  --   AST 425*  --   --  276*  --   ALT 76*  --   --  70*  --   ALKPHOS 90  --   --  96  --   BILITOT 0.7  --   --  1.3*  --   GFRNONAA  --   --  >60 >60 >60  GFRAA  --   --  >60 >60 >60  ANIONGAP  --   --  _0 < > = values in this interval not displayed.     Hematology Recent Labs  Lab 05/31/18 0532 06/01/18 0417 06/02/18 0237  WBC 10.7* 8.7 5.8  RBC 3.65* 3.24* 3.37*  HGB 10.3* 9.3* 9.7*  HCT 33.0* 29.2* 30.2*  MCV 90.4 90.1 89.6  MCH 28.2 28.7 28.8  MCHC 31.2 31.8 32.1  RDW 13.8 13.7 13.6  PLT 155 145* 150    Cardiac Enzymes Recent Labs  Lab 05/30/18 1011 05/30/18 1719 05/31/18 0034 05/31/18 0532  TROPONINI >65.00* >65.00* >65.00* >65.00*   No results for input(s): TROPIPOC in the last 168 hours.   BNPNo results for input(s): BNP, PROBNP in the last 168 hours.   DDimer No results for input(s): DDIMER in the last 168 hours.   Radiology    Dg Chest Port 1 View  Result Date: 06/01/2018 CLINICAL DATA:  Acute respiratory failure with hypoxia. EXAM: PORTABLE CHEST 1 VIEW COMPARISON:  Chest x-ray dated May 30, 2018. FINDINGS: Unchanged endotracheal and enteric tubes. Unchanged right internal jugular Swan-Ganz catheter with the tip in the region of the distal right interlobar pulmonary artery. The heart size and mediastinal contours are within normal limits. Normal pulmonary vascularity. Improved aeration at the lung bases. No focal consolidation, pleural effusion, or pneumothorax. No acute osseous abnormality.  IMPRESSION: 1. Improved aeration at the lung bases. 2. Unchanged right internal jugular Swan-Ganz catheter with tip in the region of the distal right interlobar pulmonary artery. Consider retracting at least 4 cm. Electronically Signed   By: Titus Dubin M.D.   On: 06/01/2018 07:44    Cardiac Studies   Echo: IMPRESSIONS    1. The left ventricle has low normal systolic function, with an ejection fraction of 50-55%. The cavity size was normal. Left ventricular diastolic Doppler parameters are consistent with impaired relaxation.  2. LVEF is approximately 50 to 55% with severe hypokinesis of the inferor/inferoseptal walls.  3. The right ventricle has mildly reduced systolic function. The cavity was mildly enlarged. There is no increase in right ventricular wall thickness.  4. The inferior vena cava was dilated in size with <50% respiratory variability.  5. The interatrial septum was not assessed.  Cardiac Cath: Conclusion     Mid LAD lesion is 20% stenosed.  Ost LAD to Prox LAD lesion is 20% stenosed.  Prox RCA lesion is 100% stenosed.  Dist RCA lesion is 90% stenosed.  A drug-eluting stent was successfully placed using a STENT SYNERGY DES 3X24.  Post intervention, there is a 0% residual stenosis.  A drug-eluting stent was successfully placed using a STENT SYNERGY DES 2.75X20.  Post intervention, there is a 0% residual stenosis.   1. 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 Successful PTCA/aspiration thrombectomy and drug eluting stent placement x 1 in the proximal RCA and drug eluting stent placement x 1 in the distal RCA. Significant thrombus burden remains in the mid RCA and distal RCA branches.  3. Mild non-obstructive disease in the LAD. 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 Cardiogenic shock  Recommendations: She is hemodynamically stable on low dose Levophed. Will continue Levophed. Will continue  ASA. Will give a loading dose of Brilinta 180 mg x 1 in the ICU after OG tube placement. Continue Cangrelor drip until one hour after the Brilinta is given. Will then stop Cangrelor drip and start Aggrastat drip which will infuse for the next  36-48 hours. Plan re-look cardiac cath Monday. I am hopeful that the thrombus burden that remains in the vessel will resolve with medical therapy. Beta blocker tomorrow if shock resolved. Will start high intensity statin. Echo tomorrow.    Coronary Findings   Diagnostic  Dominance: Right  Left Anterior Descending  Vessel is large.  Ost LAD to Prox LAD lesion 20% stenosed  Ost LAD to Prox LAD lesion is 20% stenosed.  Mid LAD lesion 20% stenosed  Mid LAD lesion is 20% stenosed.  Left Circumflex  Vessel is small.  Right Coronary Artery  Vessel is large.  Prox RCA lesion 100% stenosed  Prox RCA lesion is 100% stenosed. The lesion is heavily thrombotic.  Dist RCA lesion 90% stenosed  Dist RCA lesion is 90% stenosed.  Right Posterior Atrioventricular Branch  Collaterals  Post Atrio filled by collaterals from 3rd Sept.    Intervention   Prox RCA lesion  Stent  [place] GUIDE 6FR JR4 guide catheter was inserted. Pre-stent angioplasty was performed using a BALLOON SAPPHIRE 2.5X12. A drug-eluting stent was successfully placed using a STENT SYNERGY DES 3X24. Stent strut is well apposed. Post-stent angioplasty was performed using a BALLOON SAPPHIRE [place] 3.5X18.  Post-Intervention Lesion Assessment  The intervention was successful. Pre-interventional TIMI flow is 0. Post-intervention TIMI flow is 3. No complications occurred at this lesion.  There is a 0% residual stenosis post intervention.  Dist RCA lesion  Stent  Pre-stent angioplasty was performed using a BALLOON SAPPHIRE 2.5X12. A drug-eluting stent was successfully placed using a STENT SYNERGY DES 2.75X20. Stent strut is well apposed. Stent does not overlap previously placed stentPost-stent  angioplasty was not performed.  Post-Intervention Lesion Assessment  The intervention was successful. Pre-interventional TIMI flow is 0. Post-intervention TIMI flow is 3. No complications occurred at this lesion.  There is a 0% residual stenosis post intervention.  Coronary Diagrams   Diagnostic  Dominance: Right    Intervention      Patient Profile     58 y.o. female with acute inferior wall MI complicated by RV infarct and cardiogenic shock, status post PCI on 5/2  Assessment & Plan    1.  Acute inferior MI: Cardiac catheterization films reviewed.  The patient has been treated with 36 hours of IV tirofiban.  She is not having angina and continues on aspirin and ticagrelor.  We will start a beta-blocker at low-dose today.  Patient was on losartan at home.  Consider restarting at low-dose depending on her blood pressure response to metoprolol today.  2.  Acute hypoxemic respiratory failure: Related to acute RV infarction and cardiogenic shock.  Clinically improved and extubated yesterday.  Pulmonary capillary wedge pressure yesterday was 11 mmHg.  3.  Cardiogenic shock: Now resolved, begin to initiate post MI medical therapy with a low-dose beta-blocker today.  4.  Transaminitis: Likely related to acute MI with pattern of primary AST elevation.  Repeat LFTs tomorrow.  5.  Abnormal chest CT: Question thoracic aortic pathology.  Recommend repeat CTA of the chest prior to hospital discharge.  Disposition: Patient improving, now stable hemodynamically and oxygenating well on room air.  We will transfer to the floor today.   For questions or updates, please contact [place] Please consult [email or web address] for contact info under        Signed, Sherren Mocha, MD  06/02/2018, 8:45 AM

## 2018-06-02 NOTE — Evaluation (Signed)
Physical Therapy Evaluation Patient Details Name: Samantha [name] MRN: [id number] DOB: 1960-03-18 Today's Date: 06/02/2018   History of Present Illness  Pt is a 58 y.o. female admitted 05/30/18 with inferior wall MI, complicated by RV infarct and cardiogenic shock. S/p PCI 5/2.  S/p transesophageal echo 5/4. ETT 5/2-5/4. COVID testing (-). PMH includes HTN, lupus, tobacco abuse.    Clinical Impression  Pt presents with an overall decrease in functional mobility secondary to above. 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 Today, pt ambulatory with intermittent min guard for balance. Pt would benefit from continued acute PT services to maximize functional mobility and independence prior to d/c home.  Of note: PT acknowledges discontinue of PT Evaluation order, but evaluation had already been performed prior to this    Follow Up Recommendations No PT follow up;Supervision - Intermittent    Equipment Recommendations  None recommended by PT    Recommendations for Other Services       Precautions / Restrictions Precautions Precautions: Fall Restrictions Weight Bearing Restrictions: No      Mobility  Bed Mobility Overal bed mobility: Modified Independent             General bed mobility comments: increased time and effort, use of bed rails  Transfers Overall transfer level: Needs assistance Equipment used: None Transfers: Sit to/from Stand Sit to Stand: Supervision         General transfer comment: min guard for safety  Ambulation/Gait Ambulation/Gait assistance: Supervision;Min guard Gait Distance (Feet): 400 Feet Assistive device: None Gait Pattern/deviations: Step-through pattern;Decreased weight shift to left;Staggering right Gait velocity: Decreased Gait velocity interpretation: <1.8 ft/sec, indicate of risk for recurrent falls General Gait Details: Slow, intermittently unsteady gait as pt with baseline R-lateral lean and limp; reports "I've walked  slower since my mom does". Intermittent min guard for balance; no overt LOB  Stairs            Wheelchair Mobility    Modified Rankin (Stroke Patients Only)       Balance Overall balance assessment: Mild deficits observed, not formally tested                                           Pertinent Vitals/Pain Pain Assessment: 0-10 Pain Score: 7  Pain Location: lower back Pain Descriptors / Indicators: Aching;Discomfort;Constant Pain Intervention(s): Limited activity within patient's tolerance;Monitored during session    [place] expects to be discharged to:: Private residence Living Arrangements: Parent Available Help at Discharge: Family;Other (Comment)(church members/friends) Type of Home: House Home Access: Stairs to enter   [employer] of Steps: 3 Home Layout: One level Home Equipment: Tub bench      Prior Function Level of Independence: Independent         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[employer], 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     Hand Dominance   Dominant Hand: Right    Extremity/Trunk Assessment   Upper Extremity Assessment Upper Extremity Assessment: Overall WFL for tasks assessed    Lower Extremity Assessment Lower Extremity Assessment: Overall WFL for tasks assessed       Communication   Communication: No difficulties  Cognition Arousal/Alertness: Awake/alert Behavior During Therapy: WFL for tasks assessed/performed Overall Cognitive Status: Within Functional Limits for tasks assessed  General Comments General comments (skin integrity, edema, etc.): VSS    Exercises     Assessment/Plan    PT Assessment Patient needs continued PT services  PT Problem List Decreased activity tolerance;Decreased balance;Decreased mobility       PT Treatment Interventions DME instruction;Gait training;Stair training;Functional mobility  training;Therapeutic activities;Therapeutic exercise;Balance training;Patient/family education    PT Goals (Current goals can be found in the Care Plan section)  Acute Rehab PT Goals Patient Stated Goal: get home to her grandbabies PT Goal Formulation: With patient Time For Goal Achievement: 06/16/18 Potential to Achieve Goals: Good    Frequency Min 3X/week   Barriers to discharge        Co-evaluation               AM-PAC PT "6 Clicks" Mobility  Outcome Measure Help needed turning from your back to your side while in a flat bed without using bedrails?: None Help needed moving from lying on your back to sitting on the side of a flat bed without using bedrails?: None Help needed moving to and from a bed to a chair (including a wheelchair)?: None Help needed standing up from a chair using your arms (e.g., wheelchair or bedside chair)?: A Little Help needed to walk in hospital room?: A Little Help needed climbing 3-5 steps with a railing? : A Little 6 Click Score: 21    End of Session   Activity Tolerance: Patient tolerated treatment well Patient left: (in room with OT) Nurse Communication: Mobility status PT Visit Diagnosis: Other abnormalities of gait and mobility (R26.89)    Time: 9211-9417 PT Time Calculation (min) (ACUTE ONLY): 14 min   Charges:   PT Evaluation $PT Eval Moderate Complexity: [place], PT, DPT Acute Rehabilitation Services  Pager [phone] Office [place] 06/02/2018, 12:10 PM

## 2018-06-03 ENCOUNTER — Inpatient Hospital Stay (HOSPITAL_COMMUNITY): Payer: Medicare Other

## 2018-06-03 ENCOUNTER — Telehealth: Payer: Self-pay | Admitting: Physician Assistant

## 2018-06-03 ENCOUNTER — Encounter (HOSPITAL_COMMUNITY): Payer: Self-pay | Admitting: Radiology

## 2018-06-03 DIAGNOSIS — I10 Essential (primary) hypertension: Secondary | ICD-10-CM

## 2018-06-03 DIAGNOSIS — J9600 Acute respiratory failure, unspecified whether with hypoxia or hypercapnia: Secondary | ICD-10-CM

## 2018-06-03 DIAGNOSIS — R570 Cardiogenic shock: Secondary | ICD-10-CM

## 2018-06-03 DIAGNOSIS — Z720 Tobacco use: Secondary | ICD-10-CM

## 2018-06-03 LAB — BASIC METABOLIC PANEL
Anion gap: 13 (ref 5–15)
BUN: <5 mg/dL — ABNORMAL LOW (ref 6–20)
CO2: 23 mmol/L (ref 22–32)
Calcium: 8.6 mg/dL — ABNORMAL LOW (ref 8.9–10.3)
Chloride: 106 mmol/L (ref 98–111)
Creatinine, Ser: 0.64 mg/dL (ref 0.44–1.00)
GFR calc Af Amer: >60 mL/min (ref >60)
GFR calc non Af Amer: >60 mL/min (ref >60)
Glucose, Bld: 100 mg/dL — ABNORMAL HIGH (ref 70–99)
Potassium: 3.8 mmol/L (ref 3.5–5.1)
Sodium: 142 mmol/L (ref 135–145)

## 2018-06-03 MED ORDER — ATORVASTATIN CALCIUM 80 MG PO TABS: 80.0000 mg | ORAL_TABLET | Freq: Every day | ORAL | 2 refills | Status: DC

## 2018-06-03 MED ORDER — SPIRONOLACTONE 25 MG PO TABS: 12.5000 mg | ORAL_TABLET | Freq: Every day | ORAL | 1 refills | Status: DC

## 2018-06-03 MED ORDER — NITROGLYCERIN 0.4 MG SL SUBL: 0.4000 mg | SUBLINGUAL_TABLET | SUBLINGUAL | 2 refills | Status: DC | PRN

## 2018-06-03 MED ORDER — IOHEXOL 350 MG/ML SOLN
80.0000 mL | Freq: Once | INTRAVENOUS | Status: AC | PRN
  Administered 2018-06-03: 80 mL via INTRAVENOUS

## 2018-06-03 MED ORDER — TICAGRELOR 90 MG PO TABS: 90.0000 mg | ORAL_TABLET | Freq: Two times a day (BID) | ORAL | 2 refills | Status: DC

## 2018-06-03 MED ORDER — METOPROLOL TARTRATE 50 MG PO TABS: 50.0000 mg | ORAL_TABLET | Freq: Two times a day (BID) | ORAL | 2 refills | Status: DC

## 2018-06-03 MED FILL — Magnesium Sulfate Inj 50%: INTRAMUSCULAR | Qty: 10 | Status: AC

## 2018-06-03 MED FILL — Heparin Sodium (Porcine) Inj 1000 Unit/ML: INTRAMUSCULAR | Qty: 2500 | Status: AC

## 2018-06-03 MED FILL — Potassium Chloride Inj 2 mEq/ML: INTRAVENOUS | Qty: 40 | Status: AC

## 2018-06-03 MED FILL — Heparin Sodium (Porcine) Inj 1000 Unit/ML: INTRAMUSCULAR | Qty: 30 | Status: AC

## 2018-06-03 MED FILL — BRILINTA 90 MG TABLET: 90 | 30 days supply | Qty: 60 | Fill #0

## 2018-06-03 MED FILL — ATORVASTATIN CALCIUM 80 MG: 80 | 90 days supply | Qty: 90 | Fill #0

## 2018-06-03 MED FILL — METOPROLOL TARTRATE 50 MG T: 50 | 30 days supply | Qty: 60 | Fill #0

## 2018-06-03 MED FILL — NITROGLYCERIN 0.4 MG TAB SL: 0.4 | 7 days supply | Qty: 25 | Fill #0

## 2018-06-03 MED FILL — SPIRONOLACTONE 25 MG TABLET: 25 | 30 days supply | Qty: 30 | Fill #0

## 2018-06-03 NOTE — Telephone Encounter (Signed)
TOC Patient-  Please call Patient-  Pt has an appointment with Ermalinda Barrios on 06-10-18.

## 2018-06-03 NOTE — Discharge Summary (Addendum)
Discharge Summary    Patient ID: Samantha [name],  MRN: [id number], DOB/AGE: Feb 23, 1960 58 y.o.  Admit date: 05/30/2018 Discharge date: 06/03/2018  Primary Care Provider: Roma Schanz R Primary Cardiologist: Dr. Angelena Form   Discharge Diagnoses    Principal Problem:   Acute ST elevation myocardial infarction (STEMI) involving right coronary artery [facility]) Active Problems:   Tobacco use disorder   Disseminated lupus erythematosus ([place])   HTN (hypertension)   Hyperlipidemia LDL goal <100   Acute ST elevation myocardial infarction (STEMI) of inferior wall (HCC)   Cardiogenic shock (HCC)   Acute respiratory failure (HCC)   Allergies Allergies  Allergen Reactions   Tramadol Nausea Only    Feels weirded out    Diagnostic Studies/Procedures    Cath: 05/30/18   Mid LAD lesion is 20% stenosed.  Ost LAD to Prox LAD lesion is 20% stenosed.  Prox RCA lesion is 100% stenosed.  Dist RCA lesion is 90% stenosed.  A drug-eluting stent was successfully placed using a STENT SYNERGY DES 3X24.  Post intervention, there is a 0% residual stenosis.  A drug-eluting stent was successfully placed using a STENT SYNERGY DES 2.75X20.  Post intervention, there is a 0% residual stenosis.   1. 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 Successful PTCA/aspiration thrombectomy and drug eluting stent placement x 1 in the proximal RCA and drug eluting stent placement x 1 in the distal RCA. Significant thrombus burden remains in the mid RCA and distal RCA branches.  3. Mild non-obstructive disease in the LAD. 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 Cardiogenic shock  Recommendations: She is hemodynamically stable on low dose Levophed. Will continue Levophed. Will continue ASA. Will give a loading dose of Brilinta 180 mg x 1 in the ICU after OG tube placement. Continue Cangrelor drip until one hour after the Brilinta is given.  Will then stop Cangrelor drip and start Aggrastat drip which will infuse for the next 36-48 hours. Plan re-look cardiac cath Monday. I am hopeful that the thrombus burden that remains in the vessel will resolve with medical therapy. Beta blocker tomorrow if shock resolved. Will start high intensity statin. Echo tomorrow.   Diagnostic  Dominance: Right    Intervention     TTE: 05/31/18  IMPRESSIONS    1. The left ventricle has low normal systolic function, with an ejection fraction of 50-55%. The cavity size was normal. Left ventricular diastolic Doppler parameters are consistent with impaired relaxation.  2. LVEF is approximately 50 to 55% with severe hypokinesis of the inferor/inferoseptal walls.  3. The right ventricle has mildly reduced systolic function. The cavity was mildly enlarged. There is no increase in right ventricular wall thickness.  4. The inferior vena cava was dilated in size with <50% respiratory variability.  5. The interatrial septum was not assessed. _____________   History of Present Illness     Ms.Robinsonis a 58 yo female with history of systemic lupus, HTN and tobacco abuse who presented to the ED this am with c/o chest pain and abdominal pain since last night. Her pain has been present for at least 12 hours. Her initial systolic BP on arrival to the ED was in the 60s. She was assessed by Dr. Angelena Form while in the ED. She was very tender to palpation in her abdomen. EKG with slight ST elevation in AVR and lead 3 with ST depression in the lateral and anterolateral leads. Code STEMI called  by Dr. Wilson Singer. Ongoing chest pain and abdominal pain in the ED with abdominal tenderness. Levophed was started in the ED. Upon my arrival to the ED, Dr. Wilson Singer and Dr. Angelena Form discussed her unusual presentation for acute MI and elected to perform a STAT CTA of the chest and abdomen to exclude aortic dissection or perforation in the abdomen.The CTA was initially felt to be  negativefor dissection or free air in the abdomen. Just as her CTA was interpreted as negative for an acute process, her troponin returned at >65. She was taken to the cath lab for emergent cardiac cath. BP 120/75 on arrival to the cath lab.As she was being loaded onto the cath table, the lab received a call from the radiologist Dr. Polly Cobia that the patient hadevidence ofhematoma in the ascending aortawall. This likely represented an acute aortic syndrome due to dissection. The patient was taken back to the ED for further care. Dr. Angelena Form discussed with Dr. Servando Snare who was on call for CT surgery.The patient was taken to the OR and intraoperative TEE did not show evidence of a aortic dissection or flap. Surface echo with inferior wall hypokinesis. Images reviewed with our on call cardiac imaging team. Case reviewed with Dr. Servando Snare. Was then brought to the cath lab for emergent cardiac cath. She remained intubated while undergoing cath.    Hospital Course     Consultants: T[facility] Dr. Servando Snare   1. Acute inferior MI: s/p PCI to p/dRCA with DES x2. Treated with 36 hrs of IV tirofiban. Placed on DAPT with ASA/Brilinta. Troponin peaked >65. Initially developed cardiogenic shock requiring the use of pressers but weaned and now tolerating low dose BB therapy. Planned to have a relook cath, but overall progressed well and this was deferred. Was on ARB therapy prior to admission, will need to consider adding back as an outpatient. On high dose statin. Cardiac rehab completed via phone 2/2 to COVID 19 precautions.   2. Acute hypoxemic RF: 2/2 to acute RV infarct in combination with cardiogenic shock. She was weaned and extubated on 06/01/18.   3. Cardiogenic shock: required the use of presser support short term then weaned. Tolerating BB therapy, and spironolactone.  4. Transaminitis: likely related to shock. AST 276, ALT 70 on 5/4.  -- improving. Follow up LFT/FLP as an outpatient.   5. Abnormal chest  CT: seen by [facility], Dr. Servando Snare. Initial CT was concerning for dissection but interoperative TEE was normal. Recommendation for repeat CTA prior to hospital discharge. Results noted soft tissue around the distal ascending thoracic aorta and proximal aortic arch that now extends to the main pulmonary artery. Per Dr. Gwenlyn Found will arrange for outpatient follow up appt with [facility].   6. HL: on high dose statin.  ALEXANDRE LIGHTSEY was seen by Dr. Gwenlyn Found and determined stable for discharge home. Follow up in the office has been arranged. Medications are listed below.   _____________  Discharge Vitals Blood pressure 123/81, pulse 96, temperature 99.4 F (37.4 C), temperature source Oral, resp. rate 16, height 5\' 8"  (1.727 m), weight 72.6 kg, last menstrual period 04/11/2011, SpO2 99 %.  Filed Weights   05/30/18 1003 06/02/18 0500 06/03/18 0550  Weight: 72.6 kg 74.6 kg 72.6 kg    Labs & Radiologic Studies    CBC Recent Labs    06/01/18 0417 06/02/18 0237  WBC 8.7 5.8  HGB 9.3* 9.7*  HCT 29.2* 30.2*  MCV 90.1 89.6  PLT 145* 001   Basic Metabolic Panel Recent Labs  06/02/18 1406 06/03/18 0345  NA 142 142  K 3.8 3.8  CL 110 106  CO2 24 23  GLUCOSE 101* 100*  BUN 5* <5*  CREATININE 0.59 0.64  CALCIUM 8.1* 8.6*   Liver Function Tests Recent Labs    06/01/18 0417  AST 276*  ALT 70*  ALKPHOS 96  BILITOT 1.3*  PROT 5.2*  ALBUMIN 2.3*   No results for input(s): LIPASE, AMYLASE in the last 72 hours. Cardiac Enzymes No results for input(s): CKTOTAL, CKMB, CKMBINDEX, TROPONINI in the last 72 hours. BNP Invalid input(s): POCBNP D-Dimer No results for input(s): DDIMER in the last 72 hours. Hemoglobin A1C No results for input(s): HGBA1C in the last 72 hours. Fasting Lipid Panel No results for input(s): CHOL, HDL, LDLCALC, TRIG, CHOLHDL, LDLDIRECT in the last 72 hours. Thyroid Function Tests No results for input(s): TSH, T4TOTAL, T3FREE, THYROIDAB in the last 72  hours.  Invalid input(s): FREET3 _____________  Dg Chest Port 1 View  Result Date: 06/01/2018 CLINICAL DATA:  Acute respiratory failure with hypoxia. EXAM: PORTABLE CHEST 1 VIEW COMPARISON:  Chest x-ray dated May 30, 2018. FINDINGS: Unchanged endotracheal and enteric tubes. Unchanged right internal jugular Swan-Ganz catheter with the tip in the region of the distal right interlobar pulmonary artery. The heart size and mediastinal contours are within normal limits. Normal pulmonary vascularity. Improved aeration at the lung bases. No focal consolidation, pleural effusion, or pneumothorax. No acute osseous abnormality. IMPRESSION: 1. Improved aeration at the lung bases. 2. Unchanged right internal jugular Swan-Ganz catheter with tip in the region of the distal right interlobar pulmonary artery. Consider retracting at least 4 cm. Electronically Signed   By: Titus Dubin M.D.   On: 06/01/2018 07:44   Portable Chest X-ray  Result Date: 05/30/2018 CLINICAL DATA:  Intubation, history hypertension, lupus, smoker EXAM: PORTABLE CHEST 1 VIEW COMPARISON:  Portable exam 2030 hours compared to 05/30/2018 at 1015 hours FINDINGS: Tip of endotracheal tube projects 2.8 cm above carina. RIGHT jugular Swan-Ganz catheter with tip projecting over RIGHT lower lobe pulmonary artery. Nasogastric tube extends into stomach. Normal heart size, mediastinal contours, and pulmonary vascularity. Atherosclerotic calcification aorta. Bibasilar atelectasis greater on LEFT. No gross pleural effusion or pneumothorax. IMPRESSION: Line and tube positions as above. Bibasilar atelectasis greater on LEFT. Electronically Signed   By: Lavonia Dana M.D.   On: 05/30/2018 20:40   Dg Chest Portable 1 View  Result Date: 05/30/2018 CLINICAL DATA:  Chest pain EXAM: PORTABLE CHEST 1 VIEW COMPARISON:  01/12/2018 chest radiograph. FINDINGS: Stable cardiomediastinal silhouette with top-normal heart size. No pneumothorax. No pleural effusion. Lungs appear  clear, with no acute consolidative airspace disease and no pulmonary edema. IMPRESSION: No active disease. Electronically Signed   By: Ilona Sorrel M.D.   On: 05/30/2018 10:48   Dg Abd Portable 1v  Result Date: 05/30/2018 CLINICAL DATA:  OG tube placement EXAM: PORTABLE ABDOMEN - 1 VIEW COMPARISON:  None. FINDINGS: OG tube tip is in the mid to distal stomach. Nonobstructive bowel gas pattern. IMPRESSION: OG tube tip in the mid to distal stomach. Electronically Signed   By: Rolm Baptise M.D.   On: 05/30/2018 20:15   Ct Angio Chest Aorta W/cm &/or Wo/cm  Result Date: 06/03/2018 CLINICAL DATA:  Abnormal CT at admission. EXAM: CT ANGIOGRAPHY CHEST WITH CONTRAST TECHNIQUE: Multidetector CT imaging of the chest was performed using the standard protocol during bolus administration of intravenous contrast. Multiplanar CT image reconstructions and MIPs were obtained to evaluate the vascular anatomy. CONTRAST:  94mL  OMNIPAQUE IOHEXOL 350 MG/ML SOLN COMPARISON:  05/30/2018 FINDINGS: Cardiovascular: Again noted is abnormal soft tissue surrounding the distal ascending thoracic aorta and proximal aortic arch. This measures fluid density on today's study, question pericardial effusion in a recess. Small amount of pericardial fluid noted posterior to the left heart. There is cardiomegaly. Densely calcified right coronary artery. Scattered calcifications in the left anterior descending coronary artery. Aortic atherosclerosis. No evidence of aortic aneurysm. Scattered aortic calcifications. Mediastinum/Nodes: Soft tissue/fluid density adjacent to the distal ascending thoracic aorta and proximal aortic arch. This also extends adjacent to the main pulmonary artery raising the possibility of fluid within a pericardial recess. No adenopathy. Lungs/Pleura: Bilateral lower lobe airspace opacities posteriorly, likely atelectasis. Scattered patchy ground-glass opacities within the lungs, new since prior study could reflect areas of  atelectasis or early edema or alveolitis. Trace bilateral effusions. Upper Abdomen: Imaging into the upper abdomen shows no acute findings. Musculoskeletal: Chest wall soft tissues are unremarkable. No acute bony abnormality. Review of the MIP images confirms the above findings. IMPRESSION: Soft tissue again noted surrounding the distal ascending thoracic aorta and proximal aortic arch. This soft tissue now extends adjacent to the main pulmonary artery and measures fluid density. This raises the possibility of fluid within a pericardial recess. There is a small amount of pericardial fluid posterior to the left heart border. Cardiomegaly. Coronary artery disease. Trace bilateral effusions.  Bibasilar atelectasis. Aortic Atherosclerosis (ICD10-I70.0). Electronically Signed   By: Rolm Baptise M.D.   On: 06/03/2018 01:18   Ct Angio Chest/abd/pel For Dissection W And/or Wo Contrast  Result Date: 05/30/2018 CLINICAL DATA:  Severe chest pain radiating into the back with nausea and vomiting. Hypotension. EXAM: CT ANGIOGRAPHY CHEST, ABDOMEN AND PELVIS TECHNIQUE: Multidetector CT imaging through the chest, abdomen and pelvis was performed using the standard protocol during bolus administration of intravenous contrast. Multiplanar reconstructed images and MIPs were obtained and reviewed to evaluate the vascular anatomy. CONTRAST:  122mL OMNIPAQUE IOHEXOL 350 MG/ML SOLN COMPARISON:  Chest radiograph from earlier today. 03/06/2018 screening chest CT. 12/13/2015 CT abdomen/pelvis. FINDINGS: CTA CHEST FINDINGS Cardiovascular: Mild cardiomegaly. No significant pericardial effusion/thickening. Left anterior descending and right coronary atherosclerosis. Atherosclerotic nonaneurysmal thoracic aorta. There is new wall thickening in the ascending thoracic aorta compared to 03/06/2018 screening chest CT study, which extends to the junction with the aortic arch and which measures complex fluid density, which may indicate acute  intramural hematoma. No dissection flap within the thoracic aorta. No pseudoaneurysm. No penetrating atherosclerotic ulcer. Aortic arch branch vessels are patent. Normal caliber pulmonary arteries. No central pulmonary emboli. Mediastinum/Nodes: No discrete thyroid nodules. Unremarkable esophagus. No pathologically enlarged axillary, mediastinal or hilar lymph nodes. Lungs/Pleura: No pneumothorax. No pleural effusion. Mild centrilobular and paraseptal emphysema with mild diffuse bronchial wall thickening. Low-attenuation material obstructs the left lower lobe bronchus. Subsegmental scarring versus atelectasis in the left lower lobe. No acute consolidative airspace disease, lung masses or significant pulmonary nodules. Musculoskeletal: No aggressive appearing focal osseous lesions. Mild thoracic spondylosis. Review of the MIP images confirms the above findings. CTA ABDOMEN AND PELVIS FINDINGS VASCULAR Aorta: Normal caliber aorta without aneurysm, dissection, vasculitis or significant stenosis. Celiac: Patent without evidence of aneurysm, dissection, vasculitis or significant stenosis. SMA: Patent without evidence of aneurysm, dissection, vasculitis or significant stenosis. Renals: Both renal arteries are patent without evidence of aneurysm, dissection, vasculitis, fibromuscular dysplasia or significant stenosis. IMA: Patent without evidence of aneurysm, dissection, vasculitis or significant stenosis. Inflow: Patent without evidence of aneurysm, dissection, vasculitis or significant stenosis. Veins:  No obvious venous abnormality within the limitations of this arterial phase study. Review of the MIP images confirms the above findings. NON-VASCULAR Hepatobiliary: Normal liver with no liver mass. Normal gallbladder with no radiopaque cholelithiasis. No biliary ductal dilatation. Pancreas: Normal, with no mass or duct dilation. Spleen: Normal size. No mass. Adrenals/Urinary Tract: Normal adrenals. Simple 8.6 cm anterior  lower right renal cyst. No hydronephrosis. Normal bladder. Stomach/Bowel: Normal non-distended stomach. Normal caliber small bowel with no small bowel wall thickening. Normal appendix. Normal large bowel with no diverticulosis, large bowel wall thickening or pericolonic fat stranding. Vascular/Lymphatic: Atherosclerotic nonaneurysmal abdominal aorta. No pathologically enlarged lymph nodes in the abdomen or pelvis. Reproductive: Mildly enlarged myomatous uterus with calcified uterine fibroids. No adnexal masses. Other: No pneumoperitoneum, ascites or focal fluid collection. Musculoskeletal: No aggressive appearing focal osseous lesions. Moderate lower lumbar spondylosis. Review of the MIP images confirms the above findings. IMPRESSION: 1. New wall thickening in the ascending thoracic aorta compared to 03/06/2018 screening chest CT study, measuring complex fluid density, favored to represent acute aortic syndrome due to type A acute intramural hematoma. No visible dissection flap. No aneurysm or pseudoaneurysm. 2. Mild cardiomegaly.  Two-vessel coronary atherosclerosis. 3. Mild centrilobular and paraseptal emphysema with mild diffuse bronchial wall thickening, suggesting COPD. 4. Mildly enlarged calcified myomatous uterus. 5. Aortic Atherosclerosis (ICD10-I70.0) and Emphysema (ICD10-J43.9). Critical Value/emergent results were called by telephone at the time of interpretation on 05/30/2018 at 11:36 am to Dr. Virgel Manifold and to Dr. Serita Sheller in the cath lab, who verbally acknowledged these results. Electronically Signed   By: Ilona Sorrel M.D.   On: 05/30/2018 11:42   Disposition   Pt is being discharged home today in good condition.  Follow-up Plans & Appointments    Follow-up Information    Imogene Burn, PA-C Follow up on 06/10/2018.   Specialty:  Cardiology Why:  at 10am for your follow up appt. This will a telehealth visit through your mobile phone.  Contact information: [address]  [place] [address] [phone]        Grace Isaac, MD Follow up.   Specialty:  Cardiothoracic Surgery Why:  Office will call you with a follow up appt.  Contact information: [address] [place] [address] [phone]          Discharge Instructions    Amb Referral to Cardiac Rehabilitation   Complete by:  As directed    Diagnosis:  STEMI   Call MD for:  redness, tenderness, or signs of infection (pain, swelling, redness, odor or green/yellow discharge around incision site)   Complete by:  As directed    Diet - low sodium heart healthy   Complete by:  As directed    Discharge instructions   Complete by:  As directed    Groin Site Care Refer to this sheet in the next few weeks. These instructions provide you with information on caring for yourself after your procedure. Your caregiver may also give you more specific instructions. Your treatment has been planned according to current medical practices, but problems sometimes occur. Call your caregiver if you have any problems or questions after your procedure. HOME CARE INSTRUCTIONS You may shower 24 hours after the procedure. Remove the bandage (dressing) and gently wash the site with plain soap and water. Gently pat the site dry.  Do not apply powder or lotion to the site.  Do not sit in a bathtub, swimming pool, or whirlpool for 5 to  7 days.  No bending, squatting, or lifting anything over 10 pounds (4.5 kg) as directed by your caregiver.  Inspect the site at least twice daily.  Do not drive home if you are discharged the same day of the procedure. Have someone else drive you.  You may drive 24 hours after the procedure unless otherwise instructed by your caregiver.  What to expect: Any bruising will usually fade within 1 to 2 weeks.  Blood that collects in the tissue (hematoma) may be painful to the touch. It should usually decrease in size and tenderness within 1 to 2 weeks.  SEEK IMMEDIATE  MEDICAL CARE IF: You have unusual pain at the groin site or down the affected leg.  You have redness, warmth, swelling, or pain at the groin site.  You have drainage (other than a small amount of blood on the dressing).  You have chills.  You have a fever or persistent symptoms for more than 72 hours.  You have a fever and your symptoms suddenly get worse.  Your leg becomes pale, cool, tingly, or numb.  You have heavy bleeding from the site. Hold pressure on the site. Marland Kitchen  PLEASE DO NOT MISS ANY DOSES OF YOUR BRILINTA!!!!! Also keep a log of you blood pressures and bring back to your follow up appt. Please call the office with any questions.   Patients taking blood thinners should generally stay away from medicines like ibuprofen, Advil, Motrin, naproxen, and Aleve due to risk of stomach bleeding. You may take Tylenol as directed or talk to your primary doctor about alternatives.   Increase activity slowly   Complete by:  As directed        Discharge Medications     Medication List    STOP taking these medications   dexlansoprazole 60 MG capsule Commonly known as:  Dexilant   losartan 100 MG tablet Commonly known as:  COZAAR   metoprolol succinate 100 MG 24 hr tablet Commonly known as:  TOPROL-XL   nicotine 10 MG inhaler Commonly known as:  Nicotrol     TAKE these medications   acetaminophen 500 MG tablet Commonly known as:  TYLENOL Take 500 mg by mouth every 6 (six) hours as needed for moderate pain or headache.   aspirin EC 81 MG tablet Take 1 tablet (81 mg total) by mouth daily.   atorvastatin 80 MG tablet Commonly known as:  LIPITOR Take 1 tablet (80 mg total) by mouth daily at 6 PM.   CellCept 500 MG tablet Generic drug:  mycophenolate Take 1,500 mg by mouth 2 (two) times daily.   cyclobenzaprine 10 MG tablet Commonly known as:  FLEXERIL TAKE 1 TABLET(10 MG) BY MOUTH THREE TIMES DAILY AS NEEDED What changed:  See the new instructions.   metoprolol  tartrate 50 MG tablet Commonly known as:  LOPRESSOR Take 1 tablet (50 mg total) by mouth 2 (two) times daily.   multivitamin tablet Take 1 tablet by mouth daily.   nitroGLYCERIN 0.4 MG SL tablet Commonly known as:  NITROSTAT Place 1 tablet (0.4 mg total) under the tongue every 5 (five) minutes as needed for chest pain.   pantoprazole 20 MG tablet Commonly known as:  PROTONIX Take 1 tablet (20 mg total) by mouth daily.   spironolactone 25 MG tablet Commonly known as:  ALDACTONE Take 0.5 tablets (12.5 mg total) by mouth daily. Start taking on:  Jun 04, 2018   ticagrelor 90 MG Tabs tablet Commonly known as:  BRILINTA Take 1  tablet (90 mg total) by mouth 2 (two) times daily.        Acute coronary syndrome (MI, NSTEMI, STEMI, etc) this admission?: Yes.     AHA/ACC Clinical Performance & Quality Measures: 1. Aspirin prescribed? - Yes 2. ADP Receptor Inhibitor (Plavix/Clopidogrel, Brilinta/Ticagrelor or Effient/Prasugrel) prescribed (includes medically managed patients)? - Yes 3. Beta Blocker prescribed? - Yes 4. High Intensity Statin (Lipitor 40-80mg  or Crestor 20-40mg ) prescribed? - Yes 5. EF assessed during THIS hospitalization? - Yes 6. For EF <40%, was ACEI/ARB prescribed? - Not Applicable (EF >/= 17%) 7. For EF <40%, Aldosterone Antagonist (Spironolactone or Eplerenone) prescribed? - Not Applicable (EF >/= 00%) 8. Cardiac Rehab Phase II ordered (Included Medically managed Patients)? - Yes      Outstanding Labs/Studies   FLP/LFTs   Duration of Discharge Encounter   Greater than 30 minutes including physician time.  Signed, Reino Bellis NP-C 06/03/2018, 2:18 PM   Agree with note by Reino Bellis NP-C  Postop day #4 inferior STEMI complicated by cardiogenic shock status post PCI drug-eluting stenting x2 with aspiration thrombectomy and adjuvant Aggrastat for 36 hours.  Patient's EF is normal with mild RV dysfunction.  She is ambulating without chest pain or  shortness of breath.  Vital signs are stable peer exam is benign.  She stable for discharge home today on dual antiplatelet therapy.  We talked about smoking cessation and risk factor modification.  She will need TOC 7 followed by return office visit with Dr. Angelena Form.  Lorretta Harp, M.D., [place], [facility], [place] [address] [place], [place]  [address]  [phone] 06/03/2018 2:46 PM

## 2018-06-03 NOTE — Progress Notes (Signed)
Spoke with pt via telephone call. Reviewed Education with pt. Discussed stent card, exercise guidelines, restrictions, heart healthy nutrition, smoking cessation, anti-platelet medications, NTG use, chest pain, Phase II Cardiac Rehab, and risk factors. Pt verbalized understanding and receptive to information. Will send referral to [facility] per pt's request. Will drop off education documents to pt's nurse.   [place], ACSM CEP 06/03/2018  [phone]

## 2018-06-03 NOTE — Progress Notes (Signed)
Progress Note  Patient Name: Samantha [name] Date of Encounter: 06/03/2018  Primary Cardiologist: Dr. Darlina Guys  Subjective   No chest pain or shortness of breath, postop day #4 inferior MI and cardiogenic shock with implantation of 2 drug-eluting stents.  Inpatient Medications    Scheduled Meds: . aspirin  81 mg Oral Daily  . atorvastatin  80 mg Oral q1800  . enoxaparin (LOVENOX) injection  40 mg Subcutaneous Q24H  . metoprolol tartrate  25 mg Oral BID  . multivitamin with minerals  1 tablet Oral Daily  . mycophenolate  1,500 mg Oral BID  . pantoprazole  40 mg Oral Daily  . potassium chloride  40 mEq Oral BID  . spironolactone  12.5 mg Oral Daily  . ticagrelor  90 mg Oral BID   Continuous Infusions:  PRN Meds: lip balm, nitroGLYCERIN, ondansetron (ZOFRAN) IV   Vital Signs    Vitals:   06/02/18 1539 06/02/18 1700 06/02/18 2016 06/03/18 0550  BP:  (!) 154/94 106/73 123/81  Pulse:  (!) 109 (!) 102 96  Resp: 18 (!) 21 18 16   Temp: 99.1 F (37.3 C)  98.1 F (36.7 C) 99.4 F (37.4 C)  TempSrc: Oral  Oral Oral  SpO2: 97% 100%  99%  Weight:    72.6 kg  Height:        Intake/Output Summary (Last 24 hours) at 06/03/2018 0737 Last data filed at 06/02/2018 2100 Gross per 24 hour  Intake 600 ml  Output 1200 ml  Net -600 ml   Last 3 Weights 06/03/2018 06/02/2018 05/30/2018  Weight (lbs) 160 lb 1.6 oz 164 lb 7.4 oz 160 lb  Weight (kg) 72.621 kg 74.6 kg 72.576 kg      Telemetry    Sinus rhythm/sinus tachycardia with occasional PVCs- Personally Reviewed  ECG    None today- Personally Reviewed  Physical Exam   GEN: No acute distress.   Neck: No JVD Cardiac: RRR, no murmurs, rubs, or gallops.  Respiratory: Clear to auscultation bilaterally. GI: Soft, nontender, non-distended  MS: No edema; No deformity. Neuro:  Nonfocal  Psych: Normal affect   Labs    Chemistry Recent Labs  Lab 05/30/18 1107  06/01/18 0417 06/02/18 0237 06/02/18 1406 06/03/18 0345   NA  --    < > 139 140 142 142  K  --    < > 3.4* 3.2* 3.8 3.8  CL  --    < > 110 108 110 106  CO2  --    < > 20* 21* 24 23  GLUCOSE  --    < > 101* 72 101* 100*  BUN  --    < > 8 <5* 5* <5*  CREATININE  --    < > 0.60 0.63 0.59 0.64  CALCIUM  --    < > 7.8* 8.3* 8.1* 8.6*  PROT 6.5  --  5.2*  --   --   --   ALBUMIN 3.2*  --  2.3*  --   --   --   AST 425*  --  276*  --   --   --   ALT 76*  --  70*  --   --   --   ALKPHOS 90  --  96  --   --   --   BILITOT 0.7  --  1.3*  --   --   --   GFRNONAA  --    < > >60 >60 >60 >60  GFRAA  --    < > >60 >60 >60 >60  ANIONGAP  --    < > 9 11 8 13    < > = values in this interval not displayed.     Hematology Recent Labs  Lab 05/31/18 0532 06/01/18 0417 06/02/18 0237  WBC 10.7* 8.7 5.8  RBC 3.65* 3.24* 3.37*  HGB 10.3* 9.3* 9.7*  HCT 33.0* 29.2* 30.2*  MCV 90.4 90.1 89.6  MCH 28.2 28.7 28.8  MCHC 31.2 31.8 32.1  RDW 13.8 13.7 13.6  PLT 155 145* 150    Cardiac Enzymes Recent Labs  Lab 05/30/18 1011 05/30/18 1719 05/31/18 0034 05/31/18 0532  TROPONINI >65.00* >65.00* >65.00* >65.00*   No results for input(s): TROPIPOC in the last 168 hours.   BNPNo results for input(s): BNP, PROBNP in the last 168 hours.   DDimer No results for input(s): DDIMER in the last 168 hours.   Radiology    Ct Angio Chest Aorta W/cm &/or Wo/cm  Result Date: 06/03/2018 CLINICAL DATA:  Abnormal CT at admission. EXAM: CT ANGIOGRAPHY CHEST WITH CONTRAST TECHNIQUE: Multidetector CT imaging of the chest was performed using the standard protocol during bolus administration of intravenous contrast. Multiplanar CT image reconstructions and MIPs were obtained to evaluate the vascular anatomy. CONTRAST:  68mL OMNIPAQUE IOHEXOL 350 MG/ML SOLN COMPARISON:  05/30/2018 FINDINGS: Cardiovascular: Again noted is abnormal soft tissue surrounding the distal ascending thoracic aorta and proximal aortic arch. This measures fluid density on today's study, question pericardial  effusion in a recess. Small amount of pericardial fluid noted posterior to the left heart. There is cardiomegaly. Densely calcified right coronary artery. Scattered calcifications in the left anterior descending coronary artery. Aortic atherosclerosis. No evidence of aortic aneurysm. Scattered aortic calcifications. Mediastinum/Nodes: Soft tissue/fluid density adjacent to the distal ascending thoracic aorta and proximal aortic arch. This also extends adjacent to the main pulmonary artery raising the possibility of fluid within a pericardial recess. No adenopathy. Lungs/Pleura: Bilateral lower lobe airspace opacities posteriorly, likely atelectasis. Scattered patchy ground-glass opacities within the lungs, new since prior study could reflect areas of atelectasis or early edema or alveolitis. Trace bilateral effusions. Upper Abdomen: Imaging into the upper abdomen shows no acute findings. Musculoskeletal: Chest wall soft tissues are unremarkable. No acute bony abnormality. Review of the MIP images confirms the above findings. IMPRESSION: Soft tissue again noted surrounding the distal ascending thoracic aorta and proximal aortic arch. This soft tissue now extends adjacent to the main pulmonary artery and measures fluid density. This raises the possibility of fluid within a pericardial recess. There is a small amount of pericardial fluid posterior to the left heart border. Cardiomegaly. Coronary artery disease. Trace bilateral effusions.  Bibasilar atelectasis. Aortic Atherosclerosis (ICD10-I70.0). Electronically Signed   By: Rolm Baptise M.D.   On: 06/03/2018 01:18    Cardiac Studies   Cath: 05/30/18   Mid LAD lesion is 20% stenosed.  Ost LAD to Prox LAD lesion is 20% stenosed.  Prox RCA lesion is 100% stenosed.  Dist RCA lesion is 90% stenosed.  A drug-eluting stent was successfully placed using a STENT SYNERGY DES 3X24.  Post intervention, there is a 0% residual stenosis.  A drug-eluting stent was  successfully placed using a STENT SYNERGY DES 2.75X20.  Post intervention, there is a 0% residual stenosis.   1. 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 Successful PTCA/aspiration thrombectomy and drug eluting stent placement x 1 in the proximal RCA and drug eluting stent placement  x 1 in the distal RCA. Significant thrombus burden remains in the mid RCA and distal RCA branches.  3. Mild non-obstructive disease in the LAD. 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 Cardiogenic shock  Recommendations: She is hemodynamically stable on low dose Levophed. Will continue Levophed. Will continue ASA. Will give a loading dose of Brilinta 180 mg x 1 in the ICU after OG tube placement. Continue Cangrelor drip until one hour after the Brilinta is given. Will then stop Cangrelor drip and start Aggrastat drip which will infuse for the next 36-48 hours. Plan re-look cardiac cath Monday. I am hopeful that the thrombus burden that remains in the vessel will resolve with medical therapy. Beta blocker tomorrow if shock resolved. Will start high intensity statin. Echo tomorrow.   Diagnostic  Dominance: Right  Left Anterior Descending  Vessel is large.  Ost LAD to Prox LAD lesion 20% stenosed  Ost LAD to Prox LAD lesion is 20% stenosed.  Mid LAD lesion 20% stenosed  Mid LAD lesion is 20% stenosed.  Left Circumflex  Vessel is small.  Right Coronary Artery  Vessel is large.  Prox RCA lesion 100% stenosed  Prox RCA lesion is 100% stenosed. The lesion is heavily thrombotic.  Dist RCA lesion 90% stenosed  Dist RCA lesion is 90% stenosed.  Right Posterior Atrioventricular Branch  Collaterals  Post Atrio filled by collaterals from 3rd Sept.    Intervention   Prox RCA lesion  Stent  [place] GUIDE 6FR JR4 guide catheter was inserted. Pre-stent angioplasty was performed using a BALLOON SAPPHIRE 2.5X12. A drug-eluting stent was successfully  placed using a STENT SYNERGY DES 3X24. Stent strut is well apposed. Post-stent angioplasty was performed using a BALLOON SAPPHIRE [place] 3.5X18.  Post-Intervention Lesion Assessment  The intervention was successful. Pre-interventional TIMI flow is 0. Post-intervention TIMI flow is 3. No complications occurred at this lesion.  There is a 0% residual stenosis post intervention.  Dist RCA lesion  Stent  Pre-stent angioplasty was performed using a BALLOON SAPPHIRE 2.5X12. A drug-eluting stent was successfully placed using a STENT SYNERGY DES 2.75X20. Stent strut is well apposed. Stent does not overlap previously placed stentPost-stent angioplasty was not performed.  Post-Intervention Lesion Assessment  The intervention was successful. Pre-interventional TIMI flow is 0. Post-intervention TIMI flow is 3. No complications occurred at this lesion.  There is a 0% residual stenosis post intervention.  Coronary Diagrams   Diagnostic  Dominance: Right    Intervention      TTE: 05/31/18  IMPRESSIONS    1. The left ventricle has low normal systolic function, with an ejection fraction of 50-55%. The cavity size was normal. Left ventricular diastolic Doppler parameters are consistent with impaired relaxation.  2. LVEF is approximately 50 to 55% with severe hypokinesis of the inferor/inferoseptal walls.  3. The right ventricle has mildly reduced systolic function. The cavity was mildly enlarged. There is no increase in right ventricular wall thickness.  4. The inferior vena cava was dilated in size with <50% respiratory variability.  5. The interatrial septum was not assessed.   Patient Profile     58 y.o. female with PMH of SLE, HTN and tobacco use who presented with chest pain and nausea. Found to have acute inferior wall MI complicated by RV infarct and cardiogenic shock.   Assessment & Plan    1. Acute inferior MI: s/p PCI to p/dRCA with DES x2. Treated with 36 hrs of IV tirofiban. Placed on  DAPT  with ASA/Brilinta. Initially developed cardiogenic shock requiring the use of pressers but weaned and now tolerating low dose BB therapy. Was on ARB therapy prior to admission, will consider restarting today. On high dose statin.   2. Acute hypoxemic RF: 2/2 to acute RV infarct in combination with cardiogenic shock. She was weaned and extubated on 06/01/18.   3. Cardiogenic shock: resolved. Tolerating BB therapy  4. Transaminitis: likely related to shock. AST 276, ALT 70 on 5/4.  -- check LFTs  5. Abnormal chest CT: seen by [facility], Dr. Servando Snare. Recommendation for repeat CTA prior to hospital discharge. Results noted soft tissue around the distal ascending thoracic aorta and proximal aortic arch that now extends to the main pulmonary artery. Will discuss findings with MD.   For questions or updates, please contact [facility] Please consult [email or web address] for contact info under        Signed, Reino Bellis, NP  06/03/2018, 7:37 AM     Agree with note by Reino Bellis NP-C  Postop day #4 inferior MI complicated by cardiogenic shock requiring pressors.  She had a thrombotically occluded dominant RCA status post mechanical thrombectomy, Aggrastat and placement of 2 drug-eluting stents.  Her EF is preserved by 2D echo with mild RV involvement.  Troponins were greater than 65.  Her exam is benign.  She is walking without limitation or symptoms.  She stable for discharge today on dual antiplatelet therapy including aspirin and Brilinta for 12 months uninterrupted per guidelines.  We talked about smoking cessation.  She will be on high-dose lipid-lowering therapy as well.  She is somewhat tachycardic and her beta-blocker can be uptitrated prior to discharge.  We can address her losartan which she was on as an outpatient  when she seen back.  TOC 7 with follow-up in Dr. Camillia Herter office in 3 to 4 weeks.  Lorretta Harp, M.D., [place], [facility], [place]  [address] [place], [place]  [address]  [phone] 06/03/2018 10:50 AM

## 2018-06-03 NOTE — Plan of Care (Signed)
  Problem: Education: Goal: Understanding of cardiac disease, CV risk reduction, and recovery process will improve Outcome: Progressing Goal: Understanding of medication regimen will improve Outcome: Progressing Goal: Individualized Educational Video(s) Outcome: Progressing

## 2018-06-04 ENCOUNTER — Telehealth: Payer: Self-pay

## 2018-06-04 NOTE — Telephone Encounter (Signed)
Patient contacted regarding discharge from [facility] on Wednesday Jun 03, 2018.  Patient understands to follow up virtual visit with provider Ermalinda Barrios, PA on Wednesday Jun 10, 2018 at 10 AM Patient understands discharge instructions? yes Patient understands medications and regiment? yes Patient understands to have all medications to this visit? Yes-pt will take and record BP,HR and weight, knows to have available at time of virtual visit.

## 2018-06-04 NOTE — Telephone Encounter (Signed)
06/04/18  Transition Care Management Follow-up Telephone Call  ADMISSION DATE: 05/30/18  DISCHARGE DATE: 06/03/18  How have you been since you were released from the hospital?  Weakness  Do you understand why you were in the hospital? Yes   Do you understand the discharge instrcutions? Yes    Items Reviewed:  Medications reviewed: Yes  Allergies reviewed: Yes   Dietary changes reviewed: Low sodium Heart healthy   Referrals reviewed: Cardiology   Functional Questionnaire:  Activities of Daily Living (ADLs): Patient can perform all ADL's independently. Any patient concerns?    Confirmed importance and date/time of follow-up visits scheduled:Yes   Confirmed with patient if condition begins to worsen call PCP or go to the ER. Yes   Patient was given the office number and encouragred to call back with questions or concerns.Yes

## 2018-06-09 ENCOUNTER — Telehealth: Payer: Self-pay

## 2018-06-09 DIAGNOSIS — R9389 Abnormal findings on diagnostic imaging of other specified body structures: Secondary | ICD-10-CM | POA: Insufficient documentation

## 2018-06-09 DIAGNOSIS — I2510 Atherosclerotic heart disease of native coronary artery without angina pectoris: Secondary | ICD-10-CM | POA: Insufficient documentation

## 2018-06-09 DIAGNOSIS — R7401 Elevation of levels of liver transaminase levels: Secondary | ICD-10-CM | POA: Insufficient documentation

## 2018-06-09 NOTE — Progress Notes (Signed)
Virtual Visit via Video Note   This visit type was conducted due to national recommendations for restrictions regarding the COVID-19 Pandemic (e.g. social distancing) in an effort to limit this patient's exposure and mitigate transmission in our community.  Due to her co-morbid illnesses, this patient is at least at moderate risk for complications without adequate follow up.  This format is felt to be most appropriate for this patient at this time.  All issues noted in this document were discussed and addressed.  A limited physical exam was performed with this format.  Please refer to the patient's chart for her consent to telehealth for [facility].   Date:  06/10/2018   ID:  Samantha [name], DOB Jan 14, 1961, MRN [id number]  Patient Location: Home Provider Location: Home  PCP:  [name], DO  Cardiologist:  Lauree Chandler, MD   Electrophysiologist:  None   Evaluation Performed:  Follow-Up Visit  Chief Complaint:  Hospital f/u  History of Present Illness:    Samantha [name] is a 58 y.o. female with history of Lupus, HTN, tobacco abuse who presented 06/03/18  With abdominal pain. CTA negative for dissection and troponins >65. When placed on the cath table radiology called with concern of hematoma in ascending aorta wall so Dr. Servando Snare did intraop TEE which did not show aortic dissection. CAD S/P inf STEMI treated with DES prox and distal RCA with significant thrombus in mid RCA and distal RCA branches, mild non-obstructive disease in LAD with intra myocardial bridging in mid distal LAD. She initially developed cardiogenic shock requiring pressers but eventually weaned.  Patient doesn't remember much from hospital. Had some chest tenderness last night that went away quickly. No chest tightness, shortness of breath, dizziness, or presyncope. Walking around her house some but slow going. Complains of urinary urgency. Quit smoking.  The patient does not have symptoms  concerning for COVID-19 infection (fever, chills, cough, or new shortness of breath).    Past Medical History:  Diagnosis Date  . Altered mental status   . Backache, unspecified   . Gastric ulcer, unspecified as acute or chronic, without mention of hemorrhage, perforation, or obstruction   . Helicobacter pylori (H. pylori)   . Hyperlipidemia   . Lupus ([place])   . STEMI (ST elevation myocardial infarction) ([place])    PCI to p/d RCA  . Tobacco abuse   . Unspecified essential hypertension    Past Surgical History:  Procedure Laterality Date  . CORONARY/GRAFT ACUTE MI REVASCULARIZATION N/A 05/30/2018   Procedure: Coronary/Graft Acute MI Revascularization;  Surgeon: Burnell Blanks, MD;  Location: [place] [facility];  Service: Cardiovascular;  Laterality: N/A;  . INTRAOPERATIVE TRANSESOPHAGEAL ECHOCARDIOGRAM N/A 05/30/2018   Procedure: Transesophageal Echocardiogram;  Surgeon: Grace Isaac, MD;  Location: [facility] OR;  Service: Vascular;  Laterality: N/A;  . LEFT HEART CATH AND CORONARY ANGIOGRAPHY N/A 05/30/2018   Procedure: LEFT HEART CATH AND CORONARY ANGIOGRAPHY;  Surgeon: Burnell Blanks, MD;  Location: [place] [facility];  Service: Cardiovascular;  Laterality: N/A;  . UPPER GASTROINTESTINAL ENDOSCOPY  2013     Current Meds  Medication Sig  . acetaminophen (TYLENOL) 500 MG tablet Take 500 mg by mouth every 6 (six) hours as needed for moderate pain or headache.  Marland Kitchen aspirin EC 81 MG tablet Take 1 tablet (81 mg total) by mouth daily.  Marland Kitchen atorvastatin (LIPITOR) 80 MG tablet Take 1 tablet (80 mg total) by mouth daily at 6 PM.  . cyclobenzaprine (FLEXERIL) 10  MG tablet TAKE 1 TABLET(10 MG) BY MOUTH THREE TIMES DAILY AS NEEDED  . metoprolol tartrate (LOPRESSOR) 50 MG tablet Take 1 tablet (50 mg total) by mouth 2 (two) times daily.  . Multiple Vitamin (MULTIVITAMIN) tablet Take 1 tablet by mouth daily.  . mycophenolate (CELLCEPT) 500 MG tablet Take 1,500 mg by mouth 2 (two) times  daily.   . nitroGLYCERIN (NITROSTAT) 0.4 MG SL tablet Place 1 tablet (0.4 mg total) under the tongue every 5 (five) minutes as needed for chest pain.  . pantoprazole (PROTONIX) 20 MG tablet Take 1 tablet (20 mg total) by mouth daily.  Marland Kitchen spironolactone (ALDACTONE) 25 MG tablet Take 1 tablet (25 mg total) by mouth every other day.  . ticagrelor (BRILINTA) 90 MG TABS tablet Take 1 tablet (90 mg total) by mouth 2 (two) times daily.  . [DISCONTINUED] atorvastatin (LIPITOR) 80 MG tablet Take 1 tablet (80 mg total) by mouth daily at 6 PM.  . [DISCONTINUED] metoprolol tartrate (LOPRESSOR) 50 MG tablet Take 1 tablet (50 mg total) by mouth 2 (two) times daily.  . [DISCONTINUED] nitroGLYCERIN (NITROSTAT) 0.4 MG SL tablet Place 1 tablet (0.4 mg total) under the tongue every 5 (five) minutes as needed for chest pain.  . [DISCONTINUED] pantoprazole (PROTONIX) 20 MG tablet Take 1 tablet (20 mg total) by mouth daily.  . [DISCONTINUED] spironolactone (ALDACTONE) 25 MG tablet Take 0.5 tablets (12.5 mg total) by mouth daily. (Patient taking differently: Take 25 mg by mouth every other day. )  . [DISCONTINUED] ticagrelor (BRILINTA) 90 MG TABS tablet Take 1 tablet (90 mg total) by mouth 2 (two) times daily.     Allergies:   Tramadol   Social History   Tobacco Use  . Smoking status: Former Smoker    Packs/day: 0.75    Years: 40.00    Pack years: 30.00    Types: Cigarettes  . Smokeless tobacco: Never Used  . Tobacco comment: 1 cig a day  Substance Use Topics  . Alcohol use: Yes    Comment: occassional; beer   . Drug use: No     Family Hx: The patient's family history includes Diabetes in her sister; Hypertension in her mother. There is no history of Colon cancer or Stomach cancer.  ROS:   Please see the history of present illness.    Review of Systems  Constitution: Negative.  HENT: Negative.   Eyes: Negative.   Cardiovascular: Negative.   Respiratory: Negative.   Hematologic/Lymphatic: Negative.    Musculoskeletal: Negative.  Negative for joint pain.  Gastrointestinal: Negative.   Genitourinary: Negative.   Neurological: Negative.     All other systems reviewed and are negative.   Prior CV studies:   The following studies were reviewed today:  Cath: 05/30/18    Mid LAD lesion is 20% stenosed.  Ost LAD to Prox LAD lesion is 20% stenosed.  Prox RCA lesion is 100% stenosed.  Dist RCA lesion is 90% stenosed.  A drug-eluting stent was successfully placed using a STENT SYNERGY DES 3X24.  Post intervention, there is a 0% residual stenosis.  A drug-eluting stent was successfully placed using a STENT SYNERGY DES 2.75X20.  Post intervention, there is a 0% residual stenosis.   1. 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 Successful PTCA/aspiration thrombectomy and drug eluting stent placement x 1 in the proximal RCA and drug eluting stent placement x 1 in the distal RCA. Significant thrombus burden remains in the mid RCA and distal RCA  branches.  3. Mild non-obstructive disease in the LAD. 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 Cardiogenic shock   TTE: 05/31/18   IMPRESSIONS      1. The left ventricle has low normal systolic function, with an ejection fraction of 50-55%. The cavity size was normal. Left ventricular diastolic Doppler parameters are consistent with impaired relaxation.  2. LVEF is approximately 50 to 55% with severe hypokinesis of the inferor/inferoseptal walls.  3. The right ventricle has mildly reduced systolic function. The cavity was mildly enlarged. There is no increase in right ventricular wall thickness.  4. The inferior vena cava was dilated in size with <50% respiratory variability.  5. The interatrial septum was not assessed. _____________  Chest CT 5/6/2020IMPRESSION: Soft tissue again noted surrounding the distal ascending thoracic aorta and proximal aortic arch. This soft tissue now  extends adjacent to the main pulmonary artery and measures fluid density. This raises the possibility of fluid within a pericardial recess. There is a small amount of pericardial fluid posterior to the left heart border.   Cardiomegaly.  Labs/Other Tests and Data Reviewed:    EKG:  An ECG dated 06/01/18 was personally reviewed today and demonstrated:  sinus bradycardia at 41 2:1 AV block  Recent Labs: 05/31/2018: Magnesium 2.0 06/01/2018: ALT 70 06/02/2018: Hemoglobin 9.7; Platelets 150 06/03/2018: BUN <5; Creatinine, Ser 0.64; Potassium 3.8; Sodium 142   Recent Lipid Panel Lab Results  Component Value Date/Time   CHOL 168 05/30/2018 11:07 AM   TRIG 99 05/30/2018 11:07 AM   HDL 64 05/30/2018 11:07 AM   CHOLHDL 2.6 05/30/2018 11:07 AM   LDLCALC NOT CALCULATED 05/30/2018 11:07 AM   LDLDIRECT 141.8 09/15/2012 10:59 AM    Wt Readings from Last 3 Encounters:  06/10/18 150 lb (68 kg)  06/03/18 160 lb 1.6 oz (72.6 kg)  04/20/18 162 lb (73.5 kg)     Objective:    Vital Signs:  BP 110/78   Pulse 98   Ht 5\' 8"  (1.727 m)   Wt 150 lb (68 kg)   LMP 04/11/2011   BMI 22.81 kg/m    VITAL SIGNS:  reviewed GEN:  no acute distress RESPIRATORY:  normal respiratory effort, symmetric expansion CARDIOVASCULAR:  no peripheral edema  ASSESSMENT & PLAN:    1. CAD S/P inf STEMI treated with DES pRCA and dRCA complicated by cardiogenic shock. ARB held but may need restarted-BP controlled so hold off for now. Continue brillinta and ASA 2. Transaminitis likely secondary to shock will need to f/u 3. Abnormal Chest CT with soft tissue around the distal ascending aorta and prox aortic arch -will need outpatient f/u with TCTS. 4. HLD on high dose statin f/u LFT's 5. HTN BP has been controlled.   COVID-19 Education: The signs and symptoms of COVID-19 were discussed with the patient and how to seek care for testing (follow up with PCP or arrange E-visit).   The importance of social distancing was  discussed today.  Time:   Today, I have spent 13:30  minutes with the patient with telehealth technology discussing the above problems.     Medication Adjustments/Labs and Tests Ordered: Current medicines are reviewed at length with the patient today.  Concerns regarding medicines are outlined above.   Tests Ordered: Orders Placed This Encounter  Procedures  . Comprehensive metabolic panel  . CBC  . Ambulatory referral to Cardiothoracic Surgery    Medication Changes: Meds ordered this encounter  Medications  . atorvastatin (LIPITOR) 80 MG  tablet    Sig: Take 1 tablet (80 mg total) by mouth daily at 6 PM.    Dispense:  90 tablet    Refill:  3  . metoprolol tartrate (LOPRESSOR) 50 MG tablet    Sig: Take 1 tablet (50 mg total) by mouth 2 (two) times daily.    Dispense:  180 tablet    Refill:  3  . nitroGLYCERIN (NITROSTAT) 0.4 MG SL tablet    Sig: Place 1 tablet (0.4 mg total) under the tongue every 5 (five) minutes as needed for chest pain.    Dispense:  25 tablet    Refill:  2  . pantoprazole (PROTONIX) 20 MG tablet    Sig: Take 1 tablet (20 mg total) by mouth daily.    Dispense:  90 tablet    Refill:  3  . spironolactone (ALDACTONE) 25 MG tablet    Sig: Take 1 tablet (25 mg total) by mouth every other day.    Dispense:  90 tablet    Refill:  3  . ticagrelor (BRILINTA) 90 MG TABS tablet    Sig: Take 1 tablet (90 mg total) by mouth 2 (two) times daily.    Dispense:  180 tablet    Refill:  3    Disposition:  Follow up in 4 week(s) Ermalinda Barrios PA-C or Dr. Angelena Form  Signed, Ermalinda Barrios, PA-C  06/10/2018 10:20 AM    [place]

## 2018-06-09 NOTE — Telephone Encounter (Signed)

## 2018-06-10 ENCOUNTER — Encounter: Payer: Self-pay | Admitting: Physician Assistant

## 2018-06-10 ENCOUNTER — Telehealth: Payer: Self-pay

## 2018-06-10 ENCOUNTER — Other Ambulatory Visit: Payer: Self-pay

## 2018-06-10 ENCOUNTER — Telehealth (INDEPENDENT_AMBULATORY_CARE_PROVIDER_SITE_OTHER): Payer: Medicare Other | Admitting: Physician Assistant

## 2018-06-10 VITALS — BP 110/78 | HR 98 | Ht 68.0 in | Wt 150.0 lb

## 2018-06-10 DIAGNOSIS — R7401 Elevation of levels of liver transaminase levels: Secondary | ICD-10-CM

## 2018-06-10 DIAGNOSIS — E785 Hyperlipidemia, unspecified: Secondary | ICD-10-CM

## 2018-06-10 DIAGNOSIS — I10 Essential (primary) hypertension: Secondary | ICD-10-CM

## 2018-06-10 DIAGNOSIS — R570 Cardiogenic shock: Secondary | ICD-10-CM

## 2018-06-10 DIAGNOSIS — R9389 Abnormal findings on diagnostic imaging of other specified body structures: Secondary | ICD-10-CM

## 2018-06-10 DIAGNOSIS — R740 Nonspecific elevation of levels of transaminase and lactic acid dehydrogenase [LDH]: Secondary | ICD-10-CM

## 2018-06-10 DIAGNOSIS — I2510 Atherosclerotic heart disease of native coronary artery without angina pectoris: Secondary | ICD-10-CM

## 2018-06-10 MED ORDER — SPIRONOLACTONE 25 MG PO TABS: 25.0000 mg | ORAL_TABLET | ORAL | 3 refills | Status: DC

## 2018-06-10 MED ORDER — METOPROLOL TARTRATE 50 MG PO TABS: 50.0000 mg | ORAL_TABLET | Freq: Two times a day (BID) | ORAL | 3 refills | Status: DC

## 2018-06-10 MED ORDER — ATORVASTATIN CALCIUM 80 MG PO TABS: 80.0000 mg | ORAL_TABLET | Freq: Every day | ORAL | 3 refills | Status: DC

## 2018-06-10 MED ORDER — PANTOPRAZOLE SODIUM 20 MG PO TBEC: 20.0000 mg | DELAYED_RELEASE_TABLET | Freq: Every day | ORAL | 3 refills | Status: DC

## 2018-06-10 MED ORDER — TICAGRELOR 90 MG PO TABS: 90.0000 mg | ORAL_TABLET | Freq: Two times a day (BID) | ORAL | 3 refills | Status: DC

## 2018-06-10 MED ORDER — NITROGLYCERIN 0.4 MG SL SUBL: 0.4000 mg | SUBLINGUAL_TABLET | SUBLINGUAL | 2 refills | Status: DC | PRN

## 2018-06-10 NOTE — Patient Instructions (Addendum)
Medication Instructions:  Your physician recommends that you continue on your current medications as directed. Please refer to the Current Medication list given to you today.  If you need a refill on your cardiac medications before your next appointment, please call your pharmacy.   Lab work: We will have Home Health come out and draw CMET and CBC  If you have labs (blood work) drawn today and your tests are completely normal, you will receive your results only by: Marland Kitchen MyChart Message (if you have MyChart) OR . A paper copy in the mail If you have any lab test that is abnormal or we need to change your treatment, we will call you to review the results.  Testing/Procedures: None ordered  Follow-Up: . Follow up with Ermalinda Barrios, PA via VIDEO Visit on 07/08/18 at 10:00 AM . Follow up with Dr. Servando Snare in 2 months  Any Other Special Instructions Will Be Listed Below (If Applicable).

## 2018-06-10 NOTE — Telephone Encounter (Signed)
[place] Visit Initial Request  Agency Requested:    [facility] Contact:  Glory Buff, NP [place], [place] [address] Phone #:  [phone] Fax #:  [phone]  Patient Demographic Information: Name:  Samantha [name] Age:  58 y.o.   DOB:  08-12-1960  MRN:  [id number]   Address:   [place] [address]   Phone Numbers:   Home Phone [phone]  Work Phone [phone]  Mobile [phone]     Emergency Contact Information on File:   Contact Information    Name Relation Home Work Mobile   [place] Sister [phone]  [phone]   Dyann Ruddle Daughter [phone]  [phone]      The above family members may be contacted for information on this patient (review DPR on file):  No    Patient Clinical Information:  Primary Care Provider:  Carollee Herter, Alferd Apa, DO  Primary Cardiologist:  Lauree Chandler, MD  Primary Electrophysiologist:  None   Requesting Provider:  Ermalinda Barrios, PA    Past Medical Hx: [name]  has a past medical history of Altered mental status, Backache, unspecified, Gastric ulcer, unspecified as acute or chronic, without mention of hemorrhage, perforation, or obstruction, Helicobacter pylori (H. pylori), Hyperlipidemia, Lupus ([place]), STEMI (ST elevation myocardial infarction) ([place]), Tobacco abuse, and Unspecified essential hypertension.   Allergies: She is allergic to tramadol.   Medications: Current Outpatient Medications on File Prior to Visit  Medication Sig  . acetaminophen (TYLENOL) 500 MG tablet Take 500 mg by mouth every 6 (six) hours as needed for moderate pain or headache.  Marland Kitchen aspirin EC 81 MG tablet Take 1 tablet (81 mg total) by mouth daily.  Marland Kitchen atorvastatin (LIPITOR) 80 MG tablet Take 1 tablet (80 mg total) by mouth daily at 6 PM.  . cyclobenzaprine (FLEXERIL) 10 MG tablet TAKE 1 TABLET(10 MG) BY MOUTH THREE TIMES DAILY AS NEEDED  .  metoprolol tartrate (LOPRESSOR) 50 MG tablet Take 1 tablet (50 mg total) by mouth 2 (two) times daily.  . Multiple Vitamin (MULTIVITAMIN) tablet Take 1 tablet by mouth daily.  . mycophenolate (CELLCEPT) 500 MG tablet Take 1,500 mg by mouth 2 (two) times daily.   . nitroGLYCERIN (NITROSTAT) 0.4 MG SL tablet Place 1 tablet (0.4 mg total) under the tongue every 5 (five) minutes as needed for chest pain.  . pantoprazole (PROTONIX) 20 MG tablet Take 1 tablet (20 mg total) by mouth daily.  Marland Kitchen spironolactone (ALDACTONE) 25 MG tablet Take 1 tablet (25 mg total) by mouth every other day.  . ticagrelor (BRILINTA) 90 MG TABS tablet Take 1 tablet (90 mg total) by mouth 2 (two) times daily.   No current facility-administered medications on file prior to visit.      Social Hx: She  reports that she has quit smoking. Her smoking use included cigarettes. She has a 30.00 pack-year smoking history. She has never used smokeless tobacco. She reports current alcohol use. She reports that she does not use drugs.    Diagnosis/Reason for Visit:   CAD  Services Requested:  Labs:  CMET, CBC  # of Visits Needed/Frequency per Week: Once

## 2018-06-11 ENCOUNTER — Encounter: Payer: Self-pay | Admitting: Family Medicine

## 2018-06-11 ENCOUNTER — Telehealth (HOSPITAL_COMMUNITY): Payer: Self-pay | Admitting: *Deleted

## 2018-06-11 ENCOUNTER — Ambulatory Visit (INDEPENDENT_AMBULATORY_CARE_PROVIDER_SITE_OTHER): Payer: Medicare Other | Admitting: Family Medicine

## 2018-06-11 ENCOUNTER — Other Ambulatory Visit: Payer: Self-pay

## 2018-06-11 VITALS — BP 102/71 | HR 93 | Wt 145.0 lb

## 2018-06-11 DIAGNOSIS — M329 Systemic lupus erythematosus, unspecified: Secondary | ICD-10-CM

## 2018-06-11 DIAGNOSIS — R740 Nonspecific elevation of levels of transaminase and lactic acid dehydrogenase [LDH]: Secondary | ICD-10-CM | POA: Diagnosis not present

## 2018-06-11 DIAGNOSIS — I2111 ST elevation (STEMI) myocardial infarction involving right coronary artery: Secondary | ICD-10-CM | POA: Diagnosis not present

## 2018-06-11 DIAGNOSIS — I2510 Atherosclerotic heart disease of native coronary artery without angina pectoris: Secondary | ICD-10-CM

## 2018-06-11 DIAGNOSIS — Z87891 Personal history of nicotine dependence: Secondary | ICD-10-CM | POA: Diagnosis not present

## 2018-06-11 DIAGNOSIS — F17200 Nicotine dependence, unspecified, uncomplicated: Secondary | ICD-10-CM

## 2018-06-11 DIAGNOSIS — M62838 Other muscle spasm: Secondary | ICD-10-CM | POA: Diagnosis not present

## 2018-06-11 DIAGNOSIS — R9389 Abnormal findings on diagnostic imaging of other specified body structures: Secondary | ICD-10-CM | POA: Diagnosis not present

## 2018-06-11 DIAGNOSIS — E785 Hyperlipidemia, unspecified: Secondary | ICD-10-CM | POA: Diagnosis not present

## 2018-06-11 DIAGNOSIS — R570 Cardiogenic shock: Secondary | ICD-10-CM | POA: Diagnosis not present

## 2018-06-11 DIAGNOSIS — R7401 Elevation of levels of liver transaminase levels: Secondary | ICD-10-CM

## 2018-06-11 DIAGNOSIS — I10 Essential (primary) hypertension: Secondary | ICD-10-CM | POA: Diagnosis not present

## 2018-06-11 MED ORDER — CYCLOBENZAPRINE HCL 10 MG PO TABS: ORAL_TABLET | ORAL | 0 refills | Status: AC

## 2018-06-11 NOTE — Assessment & Plan Note (Signed)
Cardiology ordered labs today

## 2018-06-11 NOTE — Addendum Note (Signed)
Addended by: Roma Schanz R on: 06/11/2018 11:41 AM   Modules accepted: Level of Service

## 2018-06-11 NOTE — Telephone Encounter (Signed)
Called and spoke with pt regarding general well being and cardiac rehab.  Pt aware CR continued closure due to Covid-19.  Pt is doing some activity in the home but has not engaged in consistent exercise due to fear of going out of the home due to Covid -19.  Will send pt education materials on exercises she can do in the home using household objects such as a bottle of water for strength training and resistance.  Pt has made changes in her diet and has noticed that since she stopped smoking food taste better to her. She is slowly introducing new items in her meals.  Pt commended for continued tobacco cessation. Will send pt nutrition information on label reading and planning menus for her to review.  Pt does not have a smartphone or tablet for virtual CR. Cherre Huger, BSN Cardiac and Training and development officer

## 2018-06-11 NOTE — Assessment & Plan Note (Signed)
Per rheum 

## 2018-06-11 NOTE — Progress Notes (Signed)
Virtual Visit via Video Note  I connected with Samantha [name] on 06/11/18 at 11:30 AM EDT by a video enabled telemedicine application and verified that I am speaking with the correct person using two identifiers.  Location: Patient: home Provider: home   I discussed the limitations of evaluation and management by telemedicine and the availability of in person appointments. The patient expressed understanding and agreed to proceed.  History of Present Illness: Pt is home -- she was d/c 5/6 , admit 5//2 for acute St elevation MI involving right coronay artery--she had cath 5/6    Mid LAD lesion is 20% stenosed.  Ost LAD to Prox LAD lesion is 20% stenosed.  Prox RCA lesion is 100% stenosed.  Dist RCA lesion is 90% stenosed.  A drug-eluting stent was successfully placed using a STENT SYNERGY DES 3X24.  Post intervention, there is a 0% residual stenosis.  A drug-eluting stent was successfully placed using a STENT SYNERGY DES 2.75X20.  Post intervention, there is a 0% residual stenosis.   pt was intubated in the icu  .TTE: 05/31/18  IMPRESSIONS   1. The left ventricle has low normal systolic function, with an ejection fraction of 50-55%. The cavity size was normal. Left ventricular diastolic Doppler parameters are consistent with impaired relaxation. 2. LVEF is approximately 50 to 55% with severe hypokinesis of the inferor/inferoseptal walls. 3. The right ventricle has mildly reduced systolic function. The cavity was mildly enlarged. There is no increase in right ventricular wall thickness. 4. The inferior vena cava was dilated in size with <50% respiratory variability. 5. The interatrial septum was not assessed.  She was extubated on 06/01/18 And d/c 5/6 Cardiology f/u yesterday and labs drawn today Pt is tired but otherwise ok.  She c/o irritation with urinating and soreness in her throat.  No other complaints.   Observations/Objective: Today's Vitals   06/11/18  1108  BP: 102/71  Pulse: 93  Weight: 145 lb (65.8 kg)   Body mass index is 22.05 kg/m.;s Pt is in NAD  Pt is tired  Afebrile No covid symptoms   Assessment and Plan: 1. Muscle spasm Muscles spasms due to laying in hosp bed --- refill muscle relaxer  - cyclobenzaprine (FLEXERIL) 10 MG tablet; TAKE 1 TABLET(10 MG) BY MOUTH THREE TIMES DAILY AS NEEDED  Dispense: 30 tablet; Refill: 0  2. Acute ST elevation myocardial infarction (STEMI) involving right coronary artery [facility]) Per cardiology She had f/u yesterday  3. Hyperlipidemia LDL goal <70 .Tolerating statin, encouraged heart healthy diet, avoid trans fats, minimize simple carbs and saturated fats. Increase exercise as tolerated  4. Transaminitis Repeat labs drawn this am    Follow Up Instructions:    I discussed the assessment and treatment plan with the patient. The patient was provided an opportunity to ask questions and all were answered. The patient agreed with the plan and demonstrated an understanding of the instructions.   The patient was advised to call back or seek an in-person evaluation if the symptoms worsen or if the condition fails to improve as anticipated.  I provided 25 minutes of non-face-to-face time during this encounter.   [name], DO

## 2018-06-11 NOTE — Assessment & Plan Note (Signed)
con't statin F/u labs in 3 months

## 2018-06-11 NOTE — Assessment & Plan Note (Signed)
Pt with no CP Orders per cardiology

## 2018-06-12 ENCOUNTER — Telehealth: Payer: Self-pay

## 2018-06-12 DIAGNOSIS — R9389 Abnormal findings on diagnostic imaging of other specified body structures: Secondary | ICD-10-CM

## 2018-06-12 DIAGNOSIS — I2510 Atherosclerotic heart disease of native coronary artery without angina pectoris: Secondary | ICD-10-CM

## 2018-06-12 DIAGNOSIS — E785 Hyperlipidemia, unspecified: Secondary | ICD-10-CM

## 2018-06-12 DIAGNOSIS — R7401 Elevation of levels of liver transaminase levels: Secondary | ICD-10-CM

## 2018-06-12 LAB — CBC
Hematocrit: 36.8 % (ref 34.0–46.6)
Hemoglobin: 12.2 g/dL (ref 11.1–15.9)
MCH: 28 pg (ref 26.6–33.0)
MCHC: 33.2 g/dL (ref 31.5–35.7)
MCV: 84 fL (ref 79–97)
Platelets: 430 10*3/uL (ref 150–450)
RBC: 4.36 x10E6/uL (ref 3.77–5.28)
RDW: 12.7 % (ref 11.7–15.4)
WBC: 8.5 10*3/uL (ref 3.4–10.8)

## 2018-06-12 LAB — COMPREHENSIVE METABOLIC PANEL
ALT: 30 IU/L (ref 0–32)
AST: 38 IU/L (ref 0–40)
Albumin/Globulin Ratio: 1.1 — ABNORMAL LOW (ref 1.2–2.2)
Albumin: 4 g/dL (ref 3.8–4.9)
Alkaline Phosphatase: 98 IU/L (ref 39–117)
BUN/Creatinine Ratio: 13 (ref 9–23)
BUN: 8 mg/dL (ref 6–24)
Bilirubin Total: 0.3 mg/dL (ref 0.0–1.2)
CO2: 20 mmol/L (ref 20–29)
Calcium: 9.5 mg/dL (ref 8.7–10.2)
Chloride: 101 mmol/L (ref 96–106)
Creatinine, Ser: 0.61 mg/dL (ref 0.57–1.00)
GFR calc Af Amer: 117 mL/min/{1.73_m2} (ref >59)
GFR calc non Af Amer: 102 mL/min/{1.73_m2} (ref >59)
Globulin, Total: 3.6 g/dL (ref 1.5–4.5)
Glucose: 90 mg/dL (ref 65–99)
Potassium: 4.4 mmol/L (ref 3.5–5.2)
Sodium: 142 mmol/L (ref 134–144)
Total Protein: 7.6 g/dL (ref 6.0–8.5)

## 2018-06-12 NOTE — Telephone Encounter (Signed)
-----   Message from Imogene Burn, PA-C sent at 06/12/2018  8:27 AM EDT ----- Labs have normalized. Liver function back to normal and no longer anemic. flp and lft's in 2 months

## 2018-06-12 NOTE — Telephone Encounter (Signed)
Patient has been informed of lab results and recommendations for FLP and LFTs in 2 months. Also made patient aware that Dr. Servando Snare will hold off seeing her for now and she will just need a chest CTA in 3 months for which she will need a BMET. Patient verbalized understanding. Fasting lab appointment made for 08/27/18 at 10:00. Made patient aware that she will be contacted to schedule her chest CTA. Patient verbalized understanding and thanked me for the call.

## 2018-06-12 NOTE — Telephone Encounter (Signed)
-----   Message from Imogene Burn, PA-C sent at 06/10/2018  3:05 PM EDT ----- Regarding: RE: Appt with Dr. Servando Snare Yes that would be good. thanks ----- Message ----- From: Cleon Gustin, RN Sent: 06/10/2018  12:34 PM EDT To: Laury Deep, RN, Imogene Burn, PA-C Subject: RE: Appt with Dr. Phill Mutter!  Selinda Eon please see message from Dr. Servando Snare below. Would you like for me to order CTA Chest?  Thanks, [place] ----- Message ----- From: Laury Deep, RN Sent: 06/10/2018  11:23 AM EDT To: Cleon Gustin, RN Subject: FW: Appt with Dr. Servando Snare                     See below from Dr. Servando Snare!  Thx-Ryan ----- Message ----- From: Grace Isaac, MD Sent: 06/10/2018  10:59 AM EDT To: Laury Deep, RN Subject: RE: Appt with Dr. Servando Snare                     Discussed with Burt Knack before d/c. Cardiology was to  follow and get  cta of chest in 3 months I will review then they can refer if needed. EBG   ----- Message ----- From: Laury Deep, RN Sent: 06/10/2018  10:53 AM EDT To: Grace Isaac, MD Subject: FW: Appt with Dr. Servando Snare                     See below.  Didn't you tell me she didn't need to come back for f/u?  Thx-Ryan ----- Message ----- From: Cleon Gustin, RN Sent: 06/10/2018  10:50 AM EDT To: Laury Deep, RN Subject: Appt with Dr. Servando Snare                         Patient had a virtual visit with Ermalinda Barrios, PA today and she wanted patient to have an appointment with Dr. Servando Snare in 2 months for Abnormal Chest CT with soft tissue around the distal ascending aorta and prox aortic arch.  Thanks

## 2018-06-13 ENCOUNTER — Other Ambulatory Visit: Payer: Self-pay

## 2018-06-13 ENCOUNTER — Encounter (HOSPITAL_COMMUNITY): Payer: Self-pay | Admitting: Radiology

## 2018-06-13 ENCOUNTER — Emergency Department (HOSPITAL_COMMUNITY): Payer: Medicare Other

## 2018-06-13 ENCOUNTER — Inpatient Hospital Stay (HOSPITAL_COMMUNITY)
Admission: EM | Admit: 2018-06-13 | Discharge: 2018-06-16 | DRG: 871 | Disposition: A | Discharge status: Home / Self Care | Payer: Medicare Other | Attending: Internal Medicine | Admitting: Internal Medicine

## 2018-06-13 ENCOUNTER — Inpatient Hospital Stay (HOSPITAL_COMMUNITY): Payer: Medicare Other

## 2018-06-13 DIAGNOSIS — Z1159 Encounter for screening for other viral diseases: Secondary | ICD-10-CM

## 2018-06-13 DIAGNOSIS — K219 Gastro-esophageal reflux disease without esophagitis: Secondary | ICD-10-CM | POA: Diagnosis present

## 2018-06-13 DIAGNOSIS — Z7902 Long term (current) use of antithrombotics/antiplatelets: Secondary | ICD-10-CM | POA: Diagnosis not present

## 2018-06-13 DIAGNOSIS — R1110 Vomiting, unspecified: Secondary | ICD-10-CM | POA: Diagnosis not present

## 2018-06-13 DIAGNOSIS — Z8711 Personal history of peptic ulcer disease: Secondary | ICD-10-CM | POA: Diagnosis not present

## 2018-06-13 DIAGNOSIS — R1084 Generalized abdominal pain: Secondary | ICD-10-CM

## 2018-06-13 DIAGNOSIS — M329 Systemic lupus erythematosus, unspecified: Secondary | ICD-10-CM | POA: Diagnosis present

## 2018-06-13 DIAGNOSIS — Z7982 Long term (current) use of aspirin: Secondary | ICD-10-CM | POA: Diagnosis not present

## 2018-06-13 DIAGNOSIS — K5730 Diverticulosis of large intestine without perforation or abscess without bleeding: Secondary | ICD-10-CM | POA: Diagnosis present

## 2018-06-13 DIAGNOSIS — A4151 Sepsis due to Escherichia coli [E. coli]: Secondary | ICD-10-CM | POA: Diagnosis present

## 2018-06-13 DIAGNOSIS — R740 Nonspecific elevation of levels of transaminase and lactic acid dehydrogenase [LDH]: Secondary | ICD-10-CM | POA: Diagnosis present

## 2018-06-13 DIAGNOSIS — N281 Cyst of kidney, acquired: Secondary | ICD-10-CM | POA: Diagnosis present

## 2018-06-13 DIAGNOSIS — E876 Hypokalemia: Secondary | ICD-10-CM | POA: Diagnosis present

## 2018-06-13 DIAGNOSIS — I252 Old myocardial infarction: Secondary | ICD-10-CM | POA: Diagnosis not present

## 2018-06-13 DIAGNOSIS — I119 Hypertensive heart disease without heart failure: Secondary | ICD-10-CM | POA: Diagnosis present

## 2018-06-13 DIAGNOSIS — I959 Hypotension, unspecified: Secondary | ICD-10-CM | POA: Diagnosis not present

## 2018-06-13 DIAGNOSIS — R0602 Shortness of breath: Secondary | ICD-10-CM | POA: Diagnosis not present

## 2018-06-13 DIAGNOSIS — I214 Non-ST elevation (NSTEMI) myocardial infarction: Secondary | ICD-10-CM | POA: Diagnosis present

## 2018-06-13 DIAGNOSIS — Z833 Family history of diabetes mellitus: Secondary | ICD-10-CM

## 2018-06-13 DIAGNOSIS — I2510 Atherosclerotic heart disease of native coronary artery without angina pectoris: Secondary | ICD-10-CM | POA: Diagnosis present

## 2018-06-13 DIAGNOSIS — E785 Hyperlipidemia, unspecified: Secondary | ICD-10-CM | POA: Diagnosis present

## 2018-06-13 DIAGNOSIS — A419 Sepsis, unspecified organism: Secondary | ICD-10-CM | POA: Diagnosis not present

## 2018-06-13 DIAGNOSIS — Z8249 Family history of ischemic heart disease and other diseases of the circulatory system: Secondary | ICD-10-CM

## 2018-06-13 DIAGNOSIS — Z79899 Other long term (current) drug therapy: Secondary | ICD-10-CM | POA: Diagnosis not present

## 2018-06-13 DIAGNOSIS — Z955 Presence of coronary angioplasty implant and graft: Secondary | ICD-10-CM

## 2018-06-13 DIAGNOSIS — K529 Noninfective gastroenteritis and colitis, unspecified: Secondary | ICD-10-CM

## 2018-06-13 DIAGNOSIS — R7881 Bacteremia: Secondary | ICD-10-CM | POA: Diagnosis not present

## 2018-06-13 DIAGNOSIS — R509 Fever, unspecified: Secondary | ICD-10-CM | POA: Diagnosis not present

## 2018-06-13 DIAGNOSIS — Z885 Allergy status to narcotic agent status: Secondary | ICD-10-CM | POA: Diagnosis not present

## 2018-06-13 DIAGNOSIS — R935 Abnormal findings on diagnostic imaging of other abdominal regions, including retroperitoneum: Secondary | ICD-10-CM | POA: Diagnosis not present

## 2018-06-13 DIAGNOSIS — E860 Dehydration: Secondary | ICD-10-CM | POA: Diagnosis present

## 2018-06-13 DIAGNOSIS — I10 Essential (primary) hypertension: Secondary | ICD-10-CM | POA: Diagnosis not present

## 2018-06-13 DIAGNOSIS — A084 Viral intestinal infection, unspecified: Secondary | ICD-10-CM | POA: Diagnosis present

## 2018-06-13 DIAGNOSIS — Z87891 Personal history of nicotine dependence: Secondary | ICD-10-CM | POA: Diagnosis not present

## 2018-06-13 DIAGNOSIS — D259 Leiomyoma of uterus, unspecified: Secondary | ICD-10-CM | POA: Diagnosis present

## 2018-06-13 DIAGNOSIS — R000 Tachycardia, unspecified: Secondary | ICD-10-CM | POA: Diagnosis present

## 2018-06-13 DIAGNOSIS — I2119 ST elevation (STEMI) myocardial infarction involving other coronary artery of inferior wall: Secondary | ICD-10-CM | POA: Diagnosis not present

## 2018-06-13 LAB — URINALYSIS, ROUTINE W REFLEX MICROSCOPIC
Bilirubin Urine: NEGATIVE
Glucose, UA: NEGATIVE mg/dL
Ketones, ur: 5 mg/dL — AB
Nitrite: NEGATIVE
Protein, ur: NEGATIVE mg/dL
Specific Gravity, Urine: 1.009 (ref 1.005–1.030)
pH: 5 (ref 5.0–8.0)

## 2018-06-13 LAB — COMPREHENSIVE METABOLIC PANEL
ALT: 25 U/L (ref 0–44)
AST: 31 U/L (ref 15–41)
Albumin: 2.5 g/dL — ABNORMAL LOW (ref 3.5–5.0)
Alkaline Phosphatase: 86 U/L (ref 38–126)
Anion gap: 16 — ABNORMAL HIGH (ref 5–15)
BUN: 5 mg/dL — ABNORMAL LOW (ref 6–20)
CO2: 17 mmol/L — ABNORMAL LOW (ref 22–32)
Calcium: 8.5 mg/dL — ABNORMAL LOW (ref 8.9–10.3)
Chloride: 102 mmol/L (ref 98–111)
Creatinine, Ser: 0.93 mg/dL (ref 0.44–1.00)
GFR calc Af Amer: >60 mL/min (ref >60)
GFR calc non Af Amer: >60 mL/min (ref >60)
Glucose, Bld: 138 mg/dL — ABNORMAL HIGH (ref 70–99)
Potassium: 2.6 mmol/L — CL (ref 3.5–5.1)
Sodium: 135 mmol/L (ref 135–145)
Total Bilirubin: 1 mg/dL (ref 0.3–1.2)
Total Protein: 6.7 g/dL (ref 6.5–8.1)

## 2018-06-13 LAB — LACTIC ACID, PLASMA
Lactic Acid, Venous: 1.9 mmol/L (ref 0.5–1.9)
Lactic Acid, Venous: 0.9 mmol/L (ref 0.5–1.9)

## 2018-06-13 LAB — CBC WITH DIFFERENTIAL/PLATELET
Abs Immature Granulocytes: 0.05 10*3/uL (ref 0.00–0.07)
Basophils Absolute: 0 10*3/uL (ref 0.0–0.1)
Basophils Relative: 0 %
Eosinophils Absolute: 0 10*3/uL (ref 0.0–0.5)
Eosinophils Relative: 0 %
HCT: 35.2 % — ABNORMAL LOW (ref 36.0–46.0)
Hemoglobin: 11.2 g/dL — ABNORMAL LOW (ref 12.0–15.0)
Immature Granulocytes: 1 %
Lymphocytes Relative: 8 %
Lymphs Abs: 0.8 10*3/uL (ref 0.7–4.0)
MCH: 27.7 pg (ref 26.0–34.0)
MCHC: 31.8 g/dL (ref 30.0–36.0)
MCV: 87.1 fL (ref 80.0–100.0)
Monocytes Absolute: 0.6 10*3/uL (ref 0.1–1.0)
Monocytes Relative: 5 %
Neutro Abs: 9 10*3/uL — ABNORMAL HIGH (ref 1.7–7.7)
Neutrophils Relative %: 86 %
Platelets: 333 10*3/uL (ref 150–400)
RBC: 4.04 MIL/uL (ref 3.87–5.11)
RDW: 13.3 % (ref 11.5–15.5)
WBC: 10.4 10*3/uL (ref 4.0–10.5)
nRBC: 0 % (ref 0.0–0.2)

## 2018-06-13 LAB — MAGNESIUM: Magnesium: 1.5 mg/dL — ABNORMAL LOW (ref 1.7–2.4)

## 2018-06-13 LAB — SARS CORONAVIRUS 2 BY RT PCR (HOSPITAL ORDER, PERFORMED IN ~~LOC~~ HOSPITAL LAB): SARS Coronavirus 2: NEGATIVE

## 2018-06-13 MED ORDER — MYCOPHENOLATE MOFETIL 250 MG PO CAPS
1500.0000 mg | ORAL_CAPSULE | Freq: Two times a day (BID) | ORAL | Status: DC
  Administered 2018-06-14 – 2018-06-16 (×6): 1500 mg via ORAL
  Filled 2018-06-13 (×6): qty 6

## 2018-06-13 MED ORDER — SODIUM CHLORIDE 0.9 % IV SOLN
Freq: Once | INTRAVENOUS | Status: AC
  Administered 2018-06-13: 20:00:00 via INTRAVENOUS

## 2018-06-13 MED ORDER — NOREPINEPHRINE 4 MG/250ML-% IV SOLN
0.0000 ug/min | INTRAVENOUS | Status: DC
  Filled 2018-06-13: qty 250

## 2018-06-13 MED ORDER — HEPARIN SODIUM (PORCINE) 5000 UNIT/ML IJ SOLN
5000.0000 [IU] | Freq: Three times a day (TID) | INTRAMUSCULAR | Status: DC
  Administered 2018-06-14: 5000 [IU] via SUBCUTANEOUS
  Filled 2018-06-13: qty 1

## 2018-06-13 MED ORDER — POTASSIUM CHLORIDE 10 MEQ/100ML IV SOLN
10.0000 meq | Freq: Once | INTRAVENOUS | Status: AC
  Administered 2018-06-13: 10 meq via INTRAVENOUS
  Filled 2018-06-13: qty 100

## 2018-06-13 MED ORDER — IOHEXOL 300 MG/ML  SOLN
100.0000 mL | Freq: Once | INTRAMUSCULAR | Status: AC | PRN
  Administered 2018-06-13: 100 mL via INTRAVENOUS

## 2018-06-13 MED ORDER — MAGNESIUM SULFATE 2 GM/50ML IV SOLN
2.0000 g | Freq: Once | INTRAVENOUS | Status: AC
  Administered 2018-06-14: 2 g via INTRAVENOUS
  Filled 2018-06-13: qty 50

## 2018-06-13 MED ORDER — ACETAMINOPHEN 500 MG PO TABS
1000.0000 mg | ORAL_TABLET | Freq: Once | ORAL | Status: AC
  Administered 2018-06-13: 1000 mg via ORAL
  Filled 2018-06-13: qty 2

## 2018-06-13 MED ORDER — PIPERACILLIN-TAZOBACTAM 3.375 G IVPB 30 MIN: 3.3750 g | Freq: Four times a day (QID) | INTRAVENOUS | Status: DC

## 2018-06-13 MED ORDER — POTASSIUM CHLORIDE 10 MEQ/100ML IV SOLN
10.0000 meq | INTRAVENOUS | Status: AC
  Administered 2018-06-13: 10 meq via INTRAVENOUS
  Filled 2018-06-13: qty 100

## 2018-06-13 MED ORDER — ASPIRIN EC 81 MG PO TBEC
81.0000 mg | DELAYED_RELEASE_TABLET | Freq: Every day | ORAL | Status: DC
  Administered 2018-06-14 – 2018-06-16 (×3): 81 mg via ORAL
  Filled 2018-06-13 (×3): qty 1

## 2018-06-13 MED ORDER — ONDANSETRON HCL 4 MG/2ML IJ SOLN
4.0000 mg | Freq: Once | INTRAMUSCULAR | Status: AC
  Administered 2018-06-13: 4 mg via INTRAVENOUS
  Filled 2018-06-13: qty 2

## 2018-06-13 MED ORDER — SODIUM CHLORIDE 0.9 % IV BOLUS
1000.0000 mL | Freq: Once | INTRAVENOUS | Status: AC
  Administered 2018-06-13: 1000 mL via INTRAVENOUS

## 2018-06-13 MED ORDER — ONDANSETRON HCL 4 MG/2ML IJ SOLN
4.0000 mg | Freq: Four times a day (QID) | INTRAMUSCULAR | Status: DC | PRN
  Administered 2018-06-13 – 2018-06-15 (×2): 4 mg via INTRAVENOUS
  Filled 2018-06-13 (×2): qty 2

## 2018-06-13 MED ORDER — HYDROXYCHLOROQUINE SULFATE 200 MG PO TABS
400.0000 mg | ORAL_TABLET | ORAL | Status: DC
  Administered 2018-06-15: 400 mg via ORAL
  Filled 2018-06-13: qty 2

## 2018-06-13 MED ORDER — POTASSIUM CHLORIDE 10 MEQ/100ML IV SOLN
10.0000 meq | INTRAVENOUS | Status: AC
  Administered 2018-06-13 (×2): 10 meq via INTRAVENOUS
  Filled 2018-06-13 (×2): qty 100

## 2018-06-13 MED ORDER — POTASSIUM CHLORIDE IN NACL 40-0.9 MEQ/L-% IV SOLN
INTRAVENOUS | Status: AC
  Administered 2018-06-14: 125 mL/h via INTRAVENOUS
  Filled 2018-06-13 (×2): qty 1000

## 2018-06-13 MED ORDER — PIPERACILLIN-TAZOBACTAM 3.375 G IVPB 30 MIN
3.3750 g | Freq: Once | INTRAVENOUS | Status: AC
  Administered 2018-06-13: 3.375 g via INTRAVENOUS
  Filled 2018-06-13: qty 50

## 2018-06-13 MED ORDER — ACETAMINOPHEN 325 MG PO TABS
650.0000 mg | ORAL_TABLET | Freq: Four times a day (QID) | ORAL | Status: DC | PRN
  Administered 2018-06-14 – 2018-06-15 (×6): 650 mg via ORAL
  Filled 2018-06-13 (×6): qty 2

## 2018-06-13 MED ORDER — ONDANSETRON HCL 4 MG PO TABS: 4.0000 mg | ORAL_TABLET | Freq: Four times a day (QID) | ORAL | Status: DC | PRN

## 2018-06-13 MED ORDER — ATORVASTATIN CALCIUM 80 MG PO TABS
80.0000 mg | ORAL_TABLET | Freq: Every day | ORAL | Status: DC
  Administered 2018-06-14 – 2018-06-15 (×2): 80 mg via ORAL
  Filled 2018-06-13 (×2): qty 1

## 2018-06-13 MED ORDER — METOPROLOL SUCCINATE ER 100 MG PO TB24: 100.0000 mg | ORAL_TABLET | Freq: Every day | ORAL | Status: DC

## 2018-06-13 MED ORDER — TICAGRELOR 90 MG PO TABS
90.0000 mg | ORAL_TABLET | Freq: Two times a day (BID) | ORAL | Status: DC
  Administered 2018-06-14 – 2018-06-16 (×6): 90 mg via ORAL
  Filled 2018-06-13 (×6): qty 1

## 2018-06-13 MED ORDER — HYDROXYCHLOROQUINE SULFATE 200 MG PO TABS
200.0000 mg | ORAL_TABLET | ORAL | Status: DC
  Administered 2018-06-14 – 2018-06-16 (×2): 200 mg via ORAL
  Filled 2018-06-13 (×2): qty 1

## 2018-06-13 MED ORDER — FENTANYL CITRATE (PF) 100 MCG/2ML IJ SOLN
50.0000 ug | Freq: Once | INTRAMUSCULAR | Status: AC
  Administered 2018-06-13: 50 ug via INTRAVENOUS
  Filled 2018-06-13: qty 2

## 2018-06-13 MED ORDER — SODIUM CHLORIDE 0.9% FLUSH
3.0000 mL | Freq: Two times a day (BID) | INTRAVENOUS | Status: DC
  Administered 2018-06-16: 3 mL via INTRAVENOUS

## 2018-06-13 MED ORDER — PIPERACILLIN-TAZOBACTAM 3.375 G IVPB
3.3750 g | Freq: Three times a day (TID) | INTRAVENOUS | Status: DC
  Administered 2018-06-14 – 2018-06-15 (×7): 3.375 g via INTRAVENOUS
  Filled 2018-06-13 (×6): qty 50

## 2018-06-13 MED ORDER — ACETAMINOPHEN 650 MG RE SUPP: 650.0000 mg | Freq: Four times a day (QID) | RECTAL | Status: DC | PRN

## 2018-06-13 NOTE — ED Notes (Signed)
Pt is back from CT

## 2018-06-13 NOTE — ED Notes (Signed)
Patient called out saying she was short of breath. This RN walked in and she was hyperventilating. She complained on abdominal pain. She began vomiting and vomited about 5 episodes. EPD made aware. RN having trouble getting a reading of oxygen saturation and blood pressure.

## 2018-06-13 NOTE — ED Triage Notes (Signed)
[facility] reports that they were called out because the patient is nauseous, vomiting, abdominal pain, has a productive cough and has some shortness of breath. The EMS reports her temp to be 105.3. She denies chest pain.

## 2018-06-13 NOTE — ED Provider Notes (Signed)
Care assumed from [place] at shift change, please see his note for full details, but in brief Samantha [name] is a 58 y.o. female who presents for evaluation of abdominal pain, nausea vomiting and diarrhea as well as fever.  Symptoms started last night.  On arrival she is febrile and tachycardic, code sepsis was initiated but patient was not started on antibiotics immediately.  She has received IV fluids and Zofran.  Labs show no leukocytosis but she does have a hypokalemia started on IV potassium.  Awaiting CT abdomen pelvis as well as urinalysis, COVID test has been sent as well.  Differential includes appendicitis, colitis, urinary tract infection versus viral syndrome.  Plan: Follow-up on CT imaging, I have also ordered additional potassium supplementation for the patient and additional IV fluids as she remains tachycardic, Tylenol given for fever, awaiting CT to determine etiology for infection.  Prior care team felt the patient would need admission for observation.  Physical Exam  BP 106/67   Pulse (!) 111   Temp (!) 102.4 F (39.1 C) (Rectal)   Resp (!) 27   LMP 04/11/2011   SpO2 99%   Physical Exam Vitals signs and nursing note reviewed.  Constitutional:      General: She is not in acute distress.    Appearance: Normal appearance. She is well-developed. She is not diaphoretic.  HENT:     Head: Normocephalic and atraumatic.  Eyes:     General:        Right eye: No discharge.        Left eye: No discharge.  Cardiovascular:     Rate and Rhythm: Regular rhythm. Tachycardia present.  Pulmonary:     Effort: Pulmonary effort is normal. No respiratory distress.     Breath sounds: Normal breath sounds.  Abdominal:     General: Abdomen is flat. Bowel sounds are normal.     Palpations: Abdomen is soft.     Tenderness: There is abdominal tenderness.     Comments: Abdomen is soft and nondistended, bowel sounds present throughout, there is generalized tenderness throughout but it  is most notable in the right lower quadrant, but no peritoneal signs or guarding.  Skin:    General: Skin is warm and dry.     Capillary Refill: Capillary refill takes less than 2 seconds.  Neurological:     Mental Status: She is alert and oriented to person, place, and time.     Coordination: Coordination normal.  Psychiatric:        Mood and Affect: Mood normal.        Behavior: Behavior normal.     ED Course/Procedures   Labs Reviewed  COMPREHENSIVE METABOLIC PANEL - Abnormal; Notable for the following components:      Result Value   Potassium 2.6 (*)    CO2 17 (*)    Glucose, Bld 138 (*)    BUN 5 (*)    Calcium 8.5 (*)    Albumin 2.5 (*)    Anion gap 16 (*)    All other components within normal limits  CBC WITH DIFFERENTIAL/PLATELET - Abnormal; Notable for the following components:   Hemoglobin 11.2 (*)    HCT 35.2 (*)    Neutro Abs 9.0 (*)    All other components within normal limits  URINALYSIS, ROUTINE W REFLEX MICROSCOPIC - Abnormal; Notable for the following components:   APPearance HAZY (*)    Hgb urine dipstick SMALL (*)    Ketones, ur 5 (*)  Leukocytes,Ua SMALL (*)    Bacteria, UA RARE (*)    All other components within normal limits  SARS CORONAVIRUS 2 (HOSPITAL ORDER, PERFORMED IN [facility])  CULTURE, BLOOD (ROUTINE X 2)  CULTURE, BLOOD (ROUTINE X 2)  LACTIC ACID, PLASMA  LACTIC ACID, PLASMA  MAGNESIUM    Ct Abdomen Pelvis W Contrast  Result Date: 06/13/2018 CLINICAL DATA:  Acute onset of generalized abdominal pain, nausea and vomiting, productive cough, shortness of breath and fever up to 105 degrees Fahrenheit. EXAM: CT ABDOMEN AND PELVIS WITH CONTRAST TECHNIQUE: Multidetector CT imaging of the abdomen and pelvis was performed using the standard protocol following bolus administration of intravenous contrast. CONTRAST:  157mL OMNIPAQUE IOHEXOL 300 MG/ML IV. COMPARISON:  12/13/2015. FINDINGS: Lower chest: Linear atelectasis involving the  LEFT LOWER LOBE. Visualized lung bases otherwise clear. Heart size upper normal to slightly enlarged. RIGHT coronary artery stent. Hepatobiliary: Liver normal in size and appearance. Gallbladder normal in appearance without calcified gallstones. No biliary ductal dilation. Pancreas: Normal in appearance without evidence of mass, ductal dilation, or inflammation. Spleen: Normal in size and appearance. Adrenals/Urinary Tract: Normal appearing adrenal glands. Large benign simple cyst arising from the LOWER pole of the RIGHT kidney measuring maximally 9.5 cm, increased in size since 2017 where it measured approximately 8 cm. No solid mass involving either kidney. No hydronephrosis. No urinary tract calculi. Normal appearing urinary bladder. Stomach/Bowel: Stomach normal in appearance for the degree of distention. Normal-appearing small bowel. Entire colon decompressed, containing liquid stool. Opaque ingested material within the cecum and ascending colon. Mild edema in the fat adjacent to the cecum and ascending colon. Sigmoid colon diverticulosis without evidence of acute diverticulitis. Normal-appearing appendix low in the RIGHT side of the pelvis. Vascular/Lymphatic: Moderate aortoiliac atherosclerosis without evidence of aneurysm. No pathologic lymphadenopathy. Reproductive: Degenerating, partially calcified fibroid arising from the uterine fundus measuring approximately 4 cm. No adnexal masses. Other: None. 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 Facet degenerative changes at L4-5 and L5-S1. No acute findings. IMPRESSION: 1. Mild colitis involving the cecum and ascending colon. 2. Sigmoid colon diverticulosis without evidence of acute diverticulitis. 3. Approximate 4 cm degenerating, partially calcified uterine fundal fibroid. 4. Large approximate 9.5 cm benign simple cyst arising from the LOWER pole the RIGHT kidney. Cysts that are this large can  occasionally be symptomatic-does the patient have RIGHT flank pain? Aortic Atherosclerosis (ICD10-170.0) Electronically Signed   By: Evangeline Dakin M.D.   On: 06/13/2018 16:33   .Critical Care Performed by: Jacqlyn Larsen, PA-C Authorized by: Jacqlyn Larsen, PA-C   Critical care provider statement:    Critical care time (minutes):  45   Critical care was necessary to treat or prevent imminent or life-threatening deterioration of the following conditions:  Sepsis   Critical care was time spent personally by me on the following activities:  Discussions with consultants, evaluation of patient's response to treatment, examination of patient, ordering and performing treatments and interventions, ordering and review of laboratory studies, ordering and review of radiographic studies, pulse oximetry, re-evaluation of patient's condition, obtaining history from patient or surrogate and review of old charts    MDM   Patient presents with abdominal pain, nausea vomiting and diarrhea that started last night.  On arrival she is febrile to 103.3 and tachycardic.  She has been unable to tolerate anything by mouth and has had multiple episodes of emesis and diarrhea.  On exam she has mild generalized tenderness throughout  the abdomen most focal in the right lower quadrant.  Code sepsis was initiated but patient was not started on antibiotics given initial reassuring lab work until cause for infection could be better differentiated.  She has no leukocytosis, she does have significant hypokalemia of 2.6, IV potassium initiated, CO2 of 17 likely in the setting of dehydration, although her lactic acid is not significantly elevated.  Normal renal and liver function, urinalysis without signs of infection.  Blood cultures pending.  Magnesium pending as well.  Cova testing negative.  CT shows mild colitis of the cecum and ascending colon there is reticulosis without obvious evidence of diverticulitis.  There is a 4 cm  calcified uterine fundal fibroid noted in the right lower quadrant.  Patient also has a 9.5 cm benign cyst coming from the lower pole of the right kidney, patient is already aware of this and reports that her doctor has been monitoring this relief.  Patient reports some improvement in her pain and nausea although she still having some abdominal pain.  Given her fevers, tachycardia, and some borderline soft blood pressures in the 90s, feel would be prudent to keep her overnight, she has been started on IV Zosyn and GI pathogen panel has been ordered if patient is able to previous bowel movement here in the ED.  Patient is comfortable with this plan and in agreement.  Will consult for medicine admission.  Initially placed to Dr. Francia Greaves with [facility] hospitalist who plan to try to see and admit the patient, had requested GI consult, but patient's blood pressures were then found to be persistently in the 80s/50s.  Given persistent hypotension patient has not felt to be stable for the floor, I discussed with Dr. Billy Fischer who performed bedside cardiac ultrasound which suggested that the patient is still dehydrated, IVC was still collapsible, patient has current third liter of fluid hanging will hang additional fluid bolus and monitor blood pressure closely if blood pressures drop below the 80s will initiate pressors.  Consult placed to critical care.  Discussed with Dr. Rodman Pickle with critical care who recommends giving additional 2 fluid boluses and then will assess BPs response.  After 2 L of fluid and additional potassium patient's blood pressures have stabilized in the 63O to 756 systolic.  Discussed with Dr. Benjamine Mola editing with critical care who agrees that given stabilization of blood pressure patient does not require critical care admission at this time.  Will reconsult hospitalist for admission.  Case discussed with Dr. Posey Pronto with [facility] who agrees to see and admit the patient, does  express some concern regarding EKG changes in the setting of recent MI but patient denies any chest pain or shortness of breath.  9:00 PM called to bedside by nursing due to acute change in patient.  She suddenly began complaining of shortness of breath and was hyperventilating and gasping for air, also complaining of sudden abdominal pain and has had multiple episodes of emesis.  Initially had difficulty getting pulse ox reading patient was placed on nonrebreather, EKG showed some continued ST changes.  Admitting Dr. Posey Pronto also at bedside.  Eventually able to get accurate pulse ox reading reading between 98 and 100%, oxygen was gradually stepped down from 15 L on nonrebreather.  Chest x-ray showed no evidence of pulmonary edema and lungs are clear.  Patient has had some vomiting and is complaining of central abdominal pain, given stable blood pressures, fentanyl and zofran given for symptom management. Pt stabilizing Admitting will consult cardiology.  Jacqlyn Larsen, PA-C 06/13/18 [id number]    Gareth Morgan, MD 06/15/18 [id number]

## 2018-06-13 NOTE — ED Notes (Addendum)
Potassium 2.6      Reported to primary RN and Ashok Cordia MD

## 2018-06-13 NOTE — Progress Notes (Addendum)
ABG ordered in ER by ER Physicians assistant, but not collected. It has been well over 2 hours and order will expire in 20 minutes. There has been blood collected since and doesn't seem to be needed for ABG at this time. Patient is on floor and doing well. Will DC order and wait for reassess if needed by patients primary care MD.

## 2018-06-13 NOTE — ED Notes (Signed)
Patient transported to CT 

## 2018-06-13 NOTE — ED Notes (Signed)
PAGED CRITICAL TO [facility] @ [phone]

## 2018-06-13 NOTE — ED Notes (Signed)
ED TO INPATIENT HANDOFF REPORT  ED Nurse Name and Phone #: Burr Medico [phone]  S Name/Age/Gender Samantha [name] 58 y.o. female Room/Bed: 026C/026C  Code Status   Code Status: Prior  Home/SNF/Other Home Patient oriented to: self, place, time and situation Is this baseline? Yes   Triage Complete: Triage complete  Chief Complaint Fever  Triage Note [facility] reports that they were called out because the patient is nauseous, vomiting, abdominal pain, has a productive cough and has some shortness of breath. The EMS reports her temp to be 105.3. She denies chest pain.    Allergies Allergies  Allergen Reactions  . Tramadol Nausea Only    Feels weirded out    Level of Care/Admitting Diagnosis ED Disposition    ED Disposition Condition [facility]: [place] [[id number]]  Level of Care: Progressive [102]  Covid Evaluation: N/A  Diagnosis: Colitis [[id number]]  Admitting Physician: Lenore Cordia [[id number]]  Attending Physician: Lenore Cordia [[id number]]  Estimated length of stay: past midnight tomorrow  Certification:: I certify this patient will need inpatient services for at least 2 midnights  PT Class (Do Not Modify): Inpatient [101]  PT Acc Code (Do Not Modify): Private [1]       B Medical/Surgery History Past Medical History:  Diagnosis Date  . Altered mental status   . Backache, unspecified   . Gastric ulcer, unspecified as acute or chronic, without mention of hemorrhage, perforation, or obstruction   . Helicobacter pylori (H. pylori)   . Hyperlipidemia   . Lupus ([place])   . STEMI (ST elevation myocardial infarction) ([place])    PCI to p/d RCA  . Tobacco abuse   . Tobacco use disorder 11/22/2011  . Unspecified essential hypertension    Past Surgical History:  Procedure Laterality Date  . CORONARY/GRAFT ACUTE MI REVASCULARIZATION N/A 05/30/2018   Procedure: Coronary/Graft Acute MI Revascularization;  Surgeon: Burnell Blanks, MD;  Location: [place] [facility];  Service: Cardiovascular;  Laterality: N/A;  . INTRAOPERATIVE TRANSESOPHAGEAL ECHOCARDIOGRAM N/A 05/30/2018   Procedure: Transesophageal Echocardiogram;  Surgeon: Grace Isaac, MD;  Location: [facility] OR;  Service: Vascular;  Laterality: N/A;  . LEFT HEART CATH AND CORONARY ANGIOGRAPHY N/A 05/30/2018   Procedure: LEFT HEART CATH AND CORONARY ANGIOGRAPHY;  Surgeon: Burnell Blanks, MD;  Location: [place] [facility];  Service: Cardiovascular;  Laterality: N/A;  . UPPER GASTROINTESTINAL ENDOSCOPY  2013     A IV Location/Drains/Wounds Patient Lines/Drains/Airways Status   Active Line/Drains/Airways    Name:   Placement date:   Placement time:   Site:   Days:   Peripheral IV 06/13/18 Left Antecubital   06/13/18    -    Antecubital   less than 1   Peripheral IV 06/13/18 Right Antecubital   06/13/18    -    Antecubital   less than 1          Intake/Output Last 24 hours  Intake/Output Summary (Last 24 hours) at 06/13/2018 2049 Last data filed at 06/13/2018 2018 Gross per 24 hour  Intake 1283 ml  Output -  Net 1283 ml    Labs/Imaging Results for orders placed or performed during the hospital encounter of 06/13/18 (from the past 48 hour(s))  Lactic acid, plasma     Status: None   Collection Time: 06/13/18  1:19 PM  Result Value Ref Range   Lactic Acid, Venous 1.9 0.5 - 1.9 mmol/L    Comment: Performed at  [facility], [place] [address], [place], [place] [address]  Comprehensive metabolic panel     Status: Abnormal   Collection Time: 06/13/18  1:19 PM  Result Value Ref Range   Sodium 135 135 - 145 mmol/L   Potassium 2.6 (LL) 3.5 - 5.1 mmol/L    Comment: CRITICAL RESULT CALLED TO, READ BACK BY AND VERIFIED WITH: J.BLUE,RN @ 1434 06/13/2018 WEBBERJ    Chloride 102 98 - 111 mmol/L   CO2 17 (L) 22 - 32 mmol/L   Glucose, Bld 138 (H) 70 - 99 mg/dL   BUN 5 (L) 6 - 20 mg/dL   Creatinine, Ser 0.93 0.44 - 1.00 mg/dL   Calcium 8.5 (L)  8.9 - 10.3 mg/dL   Total Protein 6.7 6.5 - 8.1 g/dL   Albumin 2.5 (L) 3.5 - 5.0 g/dL   AST 31 15 - 41 U/L   ALT 25 0 - 44 U/L   Alkaline Phosphatase 86 38 - 126 U/L   Total Bilirubin 1.0 0.3 - 1.2 mg/dL   GFR calc non Af Amer >60 >60 mL/min   GFR calc Af Amer >60 >60 mL/min   Anion gap 16 (H) 5 - 15    Comment: Performed at [facility] [address] [place], [place] [address]  CBC WITH DIFFERENTIAL     Status: Abnormal   Collection Time: 06/13/18  1:19 PM  Result Value Ref Range   WBC 10.4 4.0 - 10.5 K/uL    Comment: WHITE COUNT CONFIRMED ON SMEAR   RBC 4.04 3.87 - 5.11 MIL/uL   Hemoglobin 11.2 (L) 12.0 - 15.0 g/dL   HCT 35.2 (L) 36.0 - 46.0 %   MCV 87.1 80.0 - 100.0 fL   MCH 27.7 26.0 - 34.0 pg   MCHC 31.8 30.0 - 36.0 g/dL   RDW 13.3 11.5 - 15.5 %   Platelets 333 150 - 400 K/uL   nRBC 0.0 0.0 - 0.2 %   Neutrophils Relative % 86 %   Neutro Abs 9.0 (H) 1.7 - 7.7 K/uL   Lymphocytes Relative 8 %   Lymphs Abs 0.8 0.7 - 4.0 K/uL   Monocytes Relative 5 %   Monocytes Absolute 0.6 0.1 - 1.0 K/uL   Eosinophils Relative 0 %   Eosinophils Absolute 0.0 0.0 - 0.5 K/uL   Basophils Relative 0 %   Basophils Absolute 0.0 0.0 - 0.1 K/uL   Immature Granulocytes 1 %   Abs Immature Granulocytes 0.05 0.00 - 0.07 K/uL    Comment: Performed at [facility], [place] [address], [place], [place] [address]  Magnesium     Status: Abnormal   Collection Time: 06/13/18  1:19 PM  Result Value Ref Range   Magnesium 1.5 (L) 1.7 - 2.4 mg/dL    Comment: Performed at [facility] [address], [place], [place] [address]  SARS Coronavirus 2 (CEPHEID- Performed in [facility] hospital lab), Hosp Order     Status: None   Collection Time: 06/13/18  1:50 PM  Result Value Ref Range   SARS Coronavirus 2 NEGATIVE NEGATIVE    Comment: (NOTE) If result is NEGATIVE SARS-CoV-2 target nucleic acids are NOT DETECTED. The SARS-CoV-2 RNA is generally detectable in upper and lower  respiratory  specimens during the acute phase of infection. The lowest  concentration of SARS-CoV-2 viral copies this assay can detect is 250  copies / mL. A negative result does not preclude SARS-CoV-2 infection  and should not be  used as the sole basis for treatment or other  patient management decisions.  A negative result may occur with  improper specimen collection / handling, submission of specimen other  than nasopharyngeal swab, presence of viral mutation(s) within the  areas targeted by this assay, and inadequate number of viral copies  (<250 copies / mL). A negative result must be combined with clinical  observations, patient history, and epidemiological information. If result is POSITIVE SARS-CoV-2 target nucleic acids are DETECTED. The SARS-CoV-2 RNA is generally detectable in upper and lower  respiratory specimens dur ing the acute phase of infection.  Positive  results are indicative of active infection with SARS-CoV-2.  Clinical  correlation with patient history and other diagnostic information is  necessary to determine patient infection status.  Positive results do  not rule out bacterial infection or co-infection with other viruses. If result is PRESUMPTIVE POSTIVE SARS-CoV-2 nucleic acids MAY BE PRESENT.   A presumptive positive result was obtained on the submitted specimen  and confirmed on repeat testing.  While 2019 novel coronavirus  (SARS-CoV-2) nucleic acids may be present in the submitted sample  additional confirmatory testing may be necessary for epidemiological  and / or clinical management purposes  to differentiate between  SARS-CoV-2 and other Sarbecovirus currently known to infect humans.  If clinically indicated additional testing with an alternate test  methodology [phone]) is advised. The SARS-CoV-2 RNA is generally  detectable in upper and lower respiratory sp ecimens during the acute  phase of infection. The expected result is Negative. Fact Sheet for  Patients:  StrictlyIdeas.no Fact Sheet for Healthcare Providers: BankingDealers.co.za This test is not yet approved or cleared by the [place] FDA and has been authorized for detection and/or diagnosis of SARS-CoV-2 by FDA under an Emergency Use Authorization (EUA).  This EUA will remain in effect (meaning this test can be used) for the duration of the COVID-19 declaration under Section 564(b)(1) of the Act, 21 U.S.C. section 360bbb-3(b)(1), unless the authorization is terminated or revoked sooner. Performed at [facility], [place] [address], [place], [place] [address]   Urinalysis, Routine w reflex microscopic     Status: Abnormal   Collection Time: 06/13/18  4:12 PM  Result Value Ref Range   Color, Urine YELLOW YELLOW   APPearance HAZY (A) CLEAR   Specific Gravity, Urine 1.009 1.005 - 1.030   pH 5.0 5.0 - 8.0   Glucose, UA NEGATIVE NEGATIVE mg/dL   Hgb urine dipstick SMALL (A) NEGATIVE   Bilirubin Urine NEGATIVE NEGATIVE   Ketones, ur 5 (A) NEGATIVE mg/dL   Protein, ur NEGATIVE NEGATIVE mg/dL   Nitrite NEGATIVE NEGATIVE   Leukocytes,Ua SMALL (A) NEGATIVE   RBC / HPF 0-5 0 - 5 RBC/hpf   WBC, UA 11-20 0 - 5 WBC/hpf   Bacteria, UA RARE (A) NONE SEEN   Squamous Epithelial / LPF 0-5 0 - 5    Comment: Performed at [facility], [place] [address] [place], [place] [address]  Lactic acid, plasma     Status: None   Collection Time: 06/13/18  6:41 PM  Result Value Ref Range   Lactic Acid, Venous 0.9 0.5 - 1.9 mmol/L    Comment: Performed at [facility] [address], [place], [place] [address]   Ct Abdomen Pelvis W Contrast  Result Date: 06/13/2018 CLINICAL DATA:  Acute onset of generalized abdominal pain, nausea and vomiting, productive cough, shortness of breath and fever up to 105 degrees Fahrenheit. EXAM: CT  ABDOMEN AND PELVIS WITH CONTRAST TECHNIQUE: Multidetector CT imaging of the abdomen and pelvis was performed  using the standard protocol following bolus administration of intravenous contrast. CONTRAST:  182mL OMNIPAQUE IOHEXOL 300 MG/ML IV. COMPARISON:  12/13/2015. FINDINGS: Lower chest: Linear atelectasis involving the LEFT LOWER LOBE. Visualized lung bases otherwise clear. Heart size upper normal to slightly enlarged. RIGHT coronary artery stent. Hepatobiliary: Liver normal in size and appearance. Gallbladder normal in appearance without calcified gallstones. No biliary ductal dilation. Pancreas: Normal in appearance without evidence of mass, ductal dilation, or inflammation. Spleen: Normal in size and appearance. Adrenals/Urinary Tract: Normal appearing adrenal glands. Large benign simple cyst arising from the LOWER pole of the RIGHT kidney measuring maximally 9.5 cm, increased in size since 2017 where it measured approximately 8 cm. No solid mass involving either kidney. No hydronephrosis. No urinary tract calculi. Normal appearing urinary bladder. Stomach/Bowel: Stomach normal in appearance for the degree of distention. Normal-appearing small bowel. Entire colon decompressed, containing liquid stool. Opaque ingested material within the cecum and ascending colon. Mild edema in the fat adjacent to the cecum and ascending colon. Sigmoid colon diverticulosis without evidence of acute diverticulitis. Normal-appearing appendix low in the RIGHT side of the pelvis. Vascular/Lymphatic: Moderate aortoiliac atherosclerosis without evidence of aneurysm. No pathologic lymphadenopathy. Reproductive: Degenerating, partially calcified fibroid arising from the uterine fundus measuring approximately 4 cm. No adnexal masses. Other: None. 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 Facet degenerative changes at L4-5 and L5-S1. No acute findings. IMPRESSION: 1. Mild colitis involving the cecum and ascending colon. 2. Sigmoid colon diverticulosis without evidence of acute  diverticulitis. 3. Approximate 4 cm degenerating, partially calcified uterine fundal fibroid. 4. Large approximate 9.5 cm benign simple cyst arising from the LOWER pole the RIGHT kidney. Cysts that are this large can occasionally be symptomatic-does the patient have RIGHT flank pain? Aortic Atherosclerosis (ICD10-170.0) Electronically Signed   By: Evangeline Dakin M.D.   On: 06/13/2018 16:33    Pending Labs Unresulted Labs (From admission, onward)    Start     Ordered   06/13/18 1802  C difficile quick scan w PCR reflex  (C Difficile quick screen w PCR reflex panel)  Once, for 24 hours,   R     06/13/18 1801   06/13/18 1652  Gastrointestinal Panel by PCR , Stool  (Gastrointestinal Panel by PCR, Stool)  Once,   R     06/13/18 1651   06/13/18 1348  Blood Culture (routine x 2)  BLOOD CULTURE X 2,   STAT     06/13/18 1347          Vitals/Pain Today's Vitals   06/13/18 2000 06/13/18 2015 06/13/18 2026 06/13/18 2028  BP: 100/65 (!) 125/91 115/75   Pulse:   (!) 104   Resp: (!) 23 (!) 25 (!) 30   Temp:      TempSrc:      SpO2:   98%   Weight:      Height:      PainSc:    0-No pain    Isolation Precautions Enteric precautions (UV disinfection)  Medications Medications  potassium chloride 10 mEq in 100 mL IVPB (0 mEq Intravenous Stopped 06/13/18 1748)  norepinephrine (LEVOPHED) 4mg  in 237mL premix infusion (0 mcg/min Intravenous Hold 06/13/18 2026)  potassium chloride 10 mEq in 100 mL IVPB (10 mEq Intravenous New Bag/Given 06/13/18 2025)  ondansetron (ZOFRAN) injection 4 mg (4 mg Intravenous Given 06/13/18 1408)  sodium chloride 0.9 %  bolus 1,000 mL (0 mLs Intravenous Stopped 06/13/18 1612)  potassium chloride 10 mEq in 100 mL IVPB (0 mEq Intravenous Stopped 06/13/18 1629)  iohexol (OMNIPAQUE) 300 MG/ML solution 100 mL (100 mLs Intravenous Contrast Given 06/13/18 1602)  acetaminophen (TYLENOL) tablet 1,000 mg (1,000 mg Oral Given 06/13/18 1610)  piperacillin-tazobactam (ZOSYN) IVPB 3.375 g  (0 g Intravenous Stopped 06/13/18 1713)  sodium chloride 0.9 % bolus 1,000 mL (1,000 mLs Intravenous New Bag/Given 06/13/18 1651)  0.9 %  sodium chloride infusion ( Intravenous New Bag/Given 06/13/18 2024)    Mobility walks High fall risk   Focused Assessments    R Recommendations: See Admitting Provider Note  Report given to:   Additional Notes:

## 2018-06-13 NOTE — H&P (Signed)
History and Physical    Samantha [name]:[id number] DOB: [birth year] DOA: 06/13/2018  PCP: [name], DO  Patient coming from: Home  I have personally briefly reviewed patient's old medical records in [place]  Chief Complaint: Nausea, vomiting, diarrhea, fevers, and chills  HPI: Samantha [name] is a 58 y.o. female with medical history significant for inferior STEMI, CAD s/p PCI to proximal and distal RCA w/ DES x2, hypertension, hyperlipidemia, and SLE who presents to the ED with 1 day of sudden onset nausea, vomiting, diarrhea, fevers, chills, and right flank pain.    She was recently admitted from 05/30/2018-06/03/2018 with an acute STEMI involving the right coronary artery and underwent PCI with DES placed in the proximal and distal RCA.  She had residual thrombus in the coronary artery and was planned to have a relook catheterization but did well afterwards and this was never pursued.  Her hospitalization was complicated by hypoxemic respiratory failure due to acute RV infarct and cardiogenic shock which improved after short-term pressor support.    She was also noted to have an abnormal chest CT, initially concerning for dissection however underwent interoperative TEE by [facility], Dr. Servando Snare, which was normal.  She had repeat CTA prior to discharge which noted soft tissue around the distal descending thoracic aorta and proximal aortic arch extending to the pulmonary artery.  She was plan to follow-up with cardiothoracic surgery as an outpatient.  Since discharge she reports feeling well and taking her medications regularly including DAPT with ASA/Brilinta daily until today.  She denies any similar chest pain or dyspnea.  She is having right flank pain and reports good urine output without dysuria.  ED Course:  Initial vitals showed BP 120/72, pulse 117, RR 23, temp 103.3 Fahrenheit orally with SPO2 100% on room air.  Repeat rectal temperature is 102.4 F.  Labs are  notable for potassium 2.6, BUN 5, creatinine 0.93, bicarb 17, serum glucose 138, anion gap 16, WBC 10.4, hemoglobin 11.2, platelets 333,000, lactic acid 1.9.  Blood cultures were obtained and pending.  SARS-CoV-2 test is negative.  Initial EKG showed sinus tachycardia, rate 127, ST depressions in the anterolateral leads and questionable ST elevation in 3 and aVF, these were new compared to prior.  Patient was noted to become hypotensive in the 80-90s/50-60s.  Bedside cardiac ultrasound by ED physician reportedly showed collapsible IVC.  Critical care were consulted and recommended IV fluid resuscitation.  She was given 2 L normal saline with improvement.  She was given IV potassium 10 mEq x 5 runs.   CT abdomen/pelvis with contrast was obtained which showed mild colitis involving the cecum and ascending colon and a large approximate 9.5 cm benign simple cyst arising from a lower pole of the right kidney.  Sigmoid colon diverticulosis without evidence of acute diverticulitis was seen as well as approximate 4 cm degenerating partially calcified uterine fundal fibroid.  EDP spoke to on-call GI who felt GI symptoms were likely infectious rather than ischemia given normal lactic acid.  The hospitalist service was consulted to admit given improved blood pressures.  Patient was noted to have recurrent emesis and new shortness of breath with hyperventilation.  She was placed on a nonrebreather.  Repeat EKG showed continued but less pronounced ST changes.  Portable chest x-ray showed clear lung fields without pulmonary edema or effusion.  Oxygen saturation was noted to be 98-100% once adequate waveform on pulse oximeter was obtained.   Review of Systems: All systems reviewed  and are negative except as documented in history of present illness above.   Past Medical History:  Diagnosis Date   Altered mental status    Backache, unspecified    Gastric ulcer, unspecified as acute or chronic, without mention  of hemorrhage, perforation, or obstruction    Helicobacter pylori (H. pylori)    Hyperlipidemia    Lupus (HCC)    STEMI (ST elevation myocardial infarction) ([place])    PCI to p/d RCA   Tobacco abuse    Tobacco use disorder 11/22/2011   Unspecified essential hypertension     Past Surgical History:  Procedure Laterality Date   CORONARY/GRAFT ACUTE MI REVASCULARIZATION N/A 05/30/2018   Procedure: Coronary/Graft Acute MI Revascularization;  Surgeon: Burnell Blanks, MD;  Location: [facility];  Service: Cardiovascular;  Laterality: N/A;   INTRAOPERATIVE TRANSESOPHAGEAL ECHOCARDIOGRAM N/A 05/30/2018   Procedure: Transesophageal Echocardiogram;  Surgeon: Grace Isaac, MD;  Location: [facility];  Service: Vascular;  Laterality: N/A;   LEFT HEART CATH AND CORONARY ANGIOGRAPHY N/A 05/30/2018   Procedure: LEFT HEART CATH AND CORONARY ANGIOGRAPHY;  Surgeon: Burnell Blanks, MD;  Location: [facility];  Service: Cardiovascular;  Laterality: N/A;   UPPER GASTROINTESTINAL ENDOSCOPY  2013    Social History:  reports that she has quit smoking. Her smoking use included cigarettes. She has a 30.00 pack-year smoking history. She has never used smokeless tobacco. She reports current alcohol use. She reports that she does not use drugs.  Allergies  Allergen Reactions   Tramadol Nausea Only    Feels weirded out    Family History  Problem Relation Age of Onset   Diabetes Sister    Hypertension Mother    Colon cancer Neg Hx    Stomach cancer Neg Hx      Prior to Admission medications   Medication Sig Start Date End Date Taking? Authorizing Provider  acetaminophen (TYLENOL) 500 MG tablet Take 500 mg by mouth every 6 (six) hours as needed for moderate pain or headache.    [provider]  aspirin EC 81 MG tablet Take 1 tablet (81 mg total) by mouth daily. 08/05/16   [name], DO  atorvastatin (LIPITOR) 80 MG tablet Take 1 tablet (80 mg  total) by mouth daily at 6 PM. 06/10/18   Bonnell Public, Jennye Moccasin, PA-C  cyclobenzaprine (FLEXERIL) 10 MG tablet TAKE 1 TABLET(10 MG) BY MOUTH THREE TIMES DAILY AS NEEDED Patient taking differently: Take 10 mg by mouth 3 (three) times daily as needed for muscle spasms.  06/11/18   [name], DO  hydroxychloroquine (PLAQUENIL) 200 MG tablet Take 200 mg by mouth as directed. Take 1 tablet one day and 2 the next day.    [provider]  metoprolol succinate (TOPROL-XL) 100 MG 24 hr tablet Take 100 mg by mouth daily. 06/11/18   [provider]  metoprolol tartrate (LOPRESSOR) 50 MG tablet Take 1 tablet (50 mg total) by mouth 2 (two) times daily. 06/10/18   Imogene Burn, PA-C  Multiple Vitamin (MULTIVITAMIN) tablet Take 1 tablet by mouth daily.    [provider]  mycophenolate (CELLCEPT) 500 MG tablet Take 1,500 mg by mouth 2 (two) times daily. For lupus    [provider]  nitroGLYCERIN (NITROSTAT) 0.4 MG SL tablet Place 1 tablet (0.4 mg total) under the tongue every 5 (five) minutes as needed for chest pain. 06/10/18   Imogene Burn, PA-C  pantoprazole (PROTONIX) 20 MG tablet Take  1 tablet (20 mg total) by mouth daily. 06/10/18   Imogene Burn, PA-C  spironolactone (ALDACTONE) 25 MG tablet Take 1 tablet (25 mg total) by mouth every other day. 06/10/18   Imogene Burn, PA-C  ticagrelor (BRILINTA) 90 MG TABS tablet Take 1 tablet (90 mg total) by mouth 2 (two) times daily. 06/10/18   Imogene Burn, PA-C    Physical Exam: Vitals:   06/13/18 2239 06/13/18 2242 06/13/18 2245 06/13/18 2248  BP: 111/68 (!) 89/68 (!) 99/59 101/88  Pulse: (!) 122 (!) 124 (!) 118 (!) 119  Resp: (!) 23 (!) 21 (!) 25 (!) 27  Temp:      TempSrc:      SpO2: 99% 100% 99% 99%  Weight:      Height:        Constitutional: Thin woman resting supine in bed, shivering, and in no acute distress Eyes: PERRL, lids and conjunctivae normal ENMT: Mucous membranes are dry. Posterior  pharynx clear of any exudate or lesions. Neck: normal, supple, no masses. Respiratory: clear to auscultation bilaterally, no wheezing, no crackles. Normal respiratory effort. No accessory muscle use.  Cardiovascular: Regular rate and rhythm, no murmurs / rubs / gallops. No extremity edema. 2+ pedal pulses. Abdomen: Generalized tenderness more prominent at the right flank. No hepatosplenomegaly. Bowel sounds positive.  Musculoskeletal: no clubbing / cyanosis. No joint deformity upper and lower extremities. Good ROM, no contractures. Normal muscle tone.  Skin: no rashes, lesions, ulcers. No induration Neurologic: CN 2-12 grossly intact. Sensation intact, Strength 5/5 in all 4.  Psychiatric: Normal judgment and insight. Alert and oriented x 3.  Somewhat anxious.    Labs on Admission: I have personally reviewed following labs and imaging studies  CBC: Recent Labs  Lab 06/11/18 1022 06/13/18 1319  WBC 8.5 10.4  NEUTROABS  --  9.0*  HGB 12.2 11.2*  HCT 36.8 35.2*  MCV 84 87.1  PLT 430 027   Basic Metabolic Panel: Recent Labs  Lab 06/11/18 1022 06/13/18 1319  NA 142 135  K 4.4 2.6*  CL 101 102  CO2 20 17*  GLUCOSE 90 138*  BUN 8 5*  CREATININE 0.61 0.93  CALCIUM 9.5 8.5*  MG  --  1.5*   GFR: Estimated Creatinine Clearance: 68.1 mL/min (by C-G formula based on SCr of 0.93 mg/dL). Liver Function Tests: Recent Labs  Lab 06/11/18 1022 06/13/18 1319  AST 38 31  ALT 30 25  ALKPHOS 98 86  BILITOT 0.3 1.0  PROT 7.6 6.7  ALBUMIN 4.0 2.5*   No results for input(s): LIPASE, AMYLASE in the last 168 hours. No results for input(s): AMMONIA in the last 168 hours. Coagulation Profile: No results for input(s): INR, PROTIME in the last 168 hours. Cardiac Enzymes: No results for input(s): CKTOTAL, CKMB, CKMBINDEX, TROPONINI in the last 168 hours. BNP (last 3 results) No results for input(s): PROBNP in the last 8760 hours. HbA1C: No results for input(s): HGBA1C in the last 72  hours. CBG: No results for input(s): GLUCAP in the last 168 hours. Lipid Profile: No results for input(s): CHOL, HDL, LDLCALC, TRIG, CHOLHDL, LDLDIRECT in the last 72 hours. Thyroid Function Tests: No results for input(s): TSH, T4TOTAL, FREET4, T3FREE, THYROIDAB in the last 72 hours. Anemia Panel: No results for input(s): VITAMINB12, FOLATE, FERRITIN, TIBC, IRON, RETICCTPCT in the last 72 hours. Urine analysis:    Component Value Date/Time   COLORURINE YELLOW 06/13/2018 1612   APPEARANCEUR HAZY (A) 06/13/2018 1612   LABSPEC 1.009  06/13/2018 1612   PHURINE 5.0 06/13/2018 1612   GLUCOSEU NEGATIVE 06/13/2018 1612   HGBUR SMALL (A) 06/13/2018 1612   BILIRUBINUR NEGATIVE 06/13/2018 1612   BILIRUBINUR neg 12/25/2015 1415   KETONESUR 5 (A) 06/13/2018 1612   PROTEINUR NEGATIVE 06/13/2018 1612   UROBILINOGEN 0.2 12/25/2015 1415   UROBILINOGEN 1.0 06/01/2014 0408   NITRITE NEGATIVE 06/13/2018 1612   LEUKOCYTESUR SMALL (A) 06/13/2018 1612    Radiological Exams on Admission: Ct Abdomen Pelvis W Contrast  Result Date: 06/13/2018 CLINICAL DATA:  Acute onset of generalized abdominal pain, nausea and vomiting, productive cough, shortness of breath and fever up to 105 degrees Fahrenheit. EXAM: CT ABDOMEN AND PELVIS WITH CONTRAST TECHNIQUE: Multidetector CT imaging of the abdomen and pelvis was performed using the standard protocol following bolus administration of intravenous contrast. CONTRAST:  136mL OMNIPAQUE IOHEXOL 300 MG/ML IV. COMPARISON:  12/13/2015. FINDINGS: Lower chest: Linear atelectasis involving the LEFT LOWER LOBE. Visualized lung bases otherwise clear. Heart size upper normal to slightly enlarged. RIGHT coronary artery stent. Hepatobiliary: Liver normal in size and appearance. Gallbladder normal in appearance without calcified gallstones. No biliary ductal dilation. Pancreas: Normal in appearance without evidence of mass, ductal dilation, or inflammation. Spleen: Normal in size and  appearance. Adrenals/Urinary Tract: Normal appearing adrenal glands. Large benign simple cyst arising from the LOWER pole of the RIGHT kidney measuring maximally 9.5 cm, increased in size since 2017 where it measured approximately 8 cm. No solid mass involving either kidney. No hydronephrosis. No urinary tract calculi. Normal appearing urinary bladder. Stomach/Bowel: Stomach normal in appearance for the degree of distention. Normal-appearing small bowel. Entire colon decompressed, containing liquid stool. Opaque ingested material within the cecum and ascending colon. Mild edema in the fat adjacent to the cecum and ascending colon. Sigmoid colon diverticulosis without evidence of acute diverticulitis. Normal-appearing appendix low in the RIGHT side of the pelvis. Vascular/Lymphatic: Moderate aortoiliac atherosclerosis without evidence of aneurysm. No pathologic lymphadenopathy. Reproductive: Degenerating, partially calcified fibroid arising from the uterine fundus measuring approximately 4 cm. No adnexal masses. Other: None. 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 Facet degenerative changes at L4-5 and L5-S1. No acute findings. IMPRESSION: 1. Mild colitis involving the cecum and ascending colon. 2. Sigmoid colon diverticulosis without evidence of acute diverticulitis. 3. Approximate 4 cm degenerating, partially calcified uterine fundal fibroid. 4. Large approximate 9.5 cm benign simple cyst arising from the LOWER pole the RIGHT kidney. Cysts that are this large can occasionally be symptomatic-does the patient have RIGHT flank pain? Aortic Atherosclerosis (ICD10-170.0) Electronically Signed   By: Evangeline Dakin M.D.   On: 06/13/2018 16:33   Dg Chest Port 1 View  Result Date: 06/13/2018 CLINICAL DATA:  Acute onset of shortness of breath. EXAM: PORTABLE CHEST 1 VIEW COMPARISON:  CTA chest 06/03/2018 and earlier. Chest x-rays 06/01/2018 and earlier.  FINDINGS: Cardiac silhouette mildly enlarged for AP portable technique, unchanged. Lungs clear. Bronchovascular markings normal. Pulmonary vascularity normal. No visible pleural effusions. No pneumothorax. IMPRESSION: Stable mild cardiomegaly. No acute cardiopulmonary disease. Electronically Signed   By: Evangeline Dakin M.D.   On: 06/13/2018 21:30    EKG: Independently reviewed. Initial EKG showed sinus tachycardia, rate 127, ST depressions in the anterolateral leads and questionable ST elevation in 3 and aVF.  These changes are new when compared to prior. Repeat EKG showed sinus tachycardia, Q waves in leads III and aVF and decreasing ST depression in leads V5-V6 and resolution in leads I, aVL.  Assessment/Plan Principal Problem:  Colitis Active Problems:   Hypokalemia   Lupus (systemic lupus erythematosus) (HCC)   HTN (hypertension)   Hyperlipidemia LDL goal <70   CAD (coronary artery disease)   Cyst of right kidney  Samantha [name] is a 58 y.o. female with medical history significant for inferior STEMI, CAD s/p PCI to proximal and distal RCA w/ DES x2, hypertension, hyperlipidemia, and SLE who presents to the ED with 1 day of sudden onset nausea, vomiting, diarrhea, fevers, chills, and right flank pain.   Febrile illness with presumed gastroenteritis/colitis: Unclear etiology, questionable viral gastroenteritis versus infectious colitis based on CT abdomen/pelvis changes.  She has not had further diarrhea while in the ED, however has continued nausea with vomiting. -If having recurrent diarrhea, check for C. difficile and GI path panel -Check lipase -Continue IV fluids overnight -Continue antiemetics as needed -Will continue empiric IV Zosyn for now, narrow as able -Follow-up blood cultures -Strict I/O's -N.p.o. except sips with meds  CAD s/p PCI to distal and proximal RCA w/ DES x 2 Inferior STEMI 05/30/18: No active chest pain, however initial EKG concerning with anterolateral  ST depressions and questionable inferior ST elevation.  Suspect related to demand ischemia in the setting of hypotension given somewhat improving changes on repeat EKG after fluid resuscitation.  Discussed case with on-call cardiology due to recent STEMI and PCI is agreed to consult on patient. -Admit to PCU, monitor telemetry -Obtain troponin, trend if positive and consider heparin anticoagulation -Repeat EKG in a.m. -Continue aspirin 81 mg daily and Brilinta -Holding home Toprol-XL with hypotension -Appreciate cardiology assistance  Hypokalemia: IV repletion ordered in the ED.  Magnesium repletion ordered.  Will recheck labs and continue to replete as needed.  Large right renal cyst: 9.5 cm benign simple cyst arising from the lower pole of the right kidney seen on CT scan.  This is a known issue followed by her primary care physician.  Last abdominal ultrasound 06/18/2016 measured the cyst at 9.2 cm.  It is possible this may now be symptomatic causing some of her right flank pain.  Hypertension: -Holding home Toprol-XL with hypotension  Hyperlipidemia: -Continue high intensity atorvastatin  SLE: -Resume home Plaquenil and CellCept.   DVT prophylaxis: subq heparin  Code Status: Full code, confirmed with patient Family Communication: None present on admission Disposition Plan: Pending clinical progress, resolution of persistent nausea/vomiting and ability to maintain adequate oral intake Consults called: Cardiology Admission status: Inpatient, patient likely requires greater than 2 midnight length stay for management of severe gastroenteritis complicated by hypotension, hypokalemia, and high risk for decompensation given comorbidities including recent inferior STEMI with DES placement, hypertension, and SLE.   Zada Finders MD [facility]  If 7PM-7AM, please contact night-coverage [email or web address]  06/13/2018, 11:48 PM

## 2018-06-13 NOTE — Consult Note (Signed)
Cardiology Consult    Patient ID: ALLURE GREASER MRN: [id number], DOB/AGE: 1960-04-28   Admit date: 06/13/2018 Date of Consult: 06/13/2018  Primary Physician: Carollee Herter, Alferd Apa, DO Primary Cardiologist: Lauree Chandler, MD  Past Medical History   Past Medical History:  Diagnosis Date   Altered mental status    Backache, unspecified    Gastric ulcer, unspecified as acute or chronic, without mention of hemorrhage, perforation, or obstruction    Helicobacter pylori (H. pylori)    Hyperlipidemia    Lupus ([place])    STEMI (ST elevation myocardial infarction) ([place])    PCI to p/d RCA   Tobacco abuse    Tobacco use disorder 11/22/2011   Unspecified essential hypertension     Past Surgical History:  Procedure Laterality Date   CORONARY/GRAFT ACUTE MI REVASCULARIZATION N/A 05/30/2018   Procedure: Coronary/Graft Acute MI Revascularization;  Surgeon: Burnell Blanks, MD;  Location: [facility];  Service: Cardiovascular;  Laterality: N/A;   INTRAOPERATIVE TRANSESOPHAGEAL ECHOCARDIOGRAM N/A 05/30/2018   Procedure: Transesophageal Echocardiogram;  Surgeon: Grace Isaac, MD;  Location: [facility];  Service: Vascular;  Laterality: N/A;   LEFT HEART CATH AND CORONARY ANGIOGRAPHY N/A 05/30/2018   Procedure: LEFT HEART CATH AND CORONARY ANGIOGRAPHY;  Surgeon: Burnell Blanks, MD;  Location: [facility];  Service: Cardiovascular;  Laterality: N/A;   UPPER GASTROINTESTINAL ENDOSCOPY  2013     Allergies  Allergies  Allergen Reactions   Tramadol Nausea Only    Feels weirded out    History of Present Illness    58 year old female with a past medical history of inferior STEMI, status post PCI to the RCA.  She presents with sudden onset of nausea vomiting diarrhea fever and chills starting yesterday.  Reports some upper abdominal pain.  This is nothing comparable to the pain she had a month ago in the setting of her STEMI.  At that time point  pain was retrosternal.  Left heart cath did not identify any significant lesions besides the one in the RCA.  Any sick contacts.  New medications.  No missed doses of aspirin ticagrelor.  On admission to the ED she is hypertensive and tachycardic.  Significant past medical history includes lupus.  She is on immunosuppressive medication for this.  Inpatient Medications     [START ON 06/14/2018] aspirin EC  81 mg Oral Daily   [START ON 06/14/2018] atorvastatin  80 mg Oral q1800   heparin  5,000 Units Subcutaneous Q8H   sodium chloride flush  3 mL Intravenous Q12H   ticagrelor  90 mg Oral BID    Family History    Family History  Problem Relation Age of Onset   Diabetes Sister    Hypertension Mother    Colon cancer Neg Hx    Stomach cancer Neg Hx    She indicated that her mother is alive. She indicated that her father is deceased. She indicated that her sister is alive. She indicated that the status of her neg hx is unknown.   Social History    Social History   Socioeconomic History   Marital status: Single    Spouse name: Not on file   Number of children: Not on file   Years of education: Not on file   Highest education level: Not on file  Occupational History   Occupation: permanant disability    Employer: [employer]  Social Needs   Financial resource strain: Not on file   Food insecurity:  Worry: Not on file    Inability: Not on file   Transportation needs:    Medical: Not on file    Non-medical: Not on file  Tobacco Use   Smoking status: Former Smoker    Packs/day: 0.75    Years: 40.00    Pack years: 30.00    Types: Cigarettes   Smokeless tobacco: Never Used   Tobacco comment: 1 cig a day  Substance and Sexual Activity   Alcohol use: Yes    Comment: occassional; beer    Drug use: No   Sexual activity: Not Currently    Partners: Male  Lifestyle   Physical activity:    Days per week: Not on file    Minutes per session: Not on  file   Stress: Not on file  Relationships   Social connections:    Talks on phone: Not on file    Gets together: Not on file    Attends religious service: Not on file    Active member of club or organization: Not on file    Attends meetings of clubs or organizations: Not on file    Relationship status: Not on file   Intimate partner violence:    Fear of current or ex partner: Not on file    Emotionally abused: Not on file    Physically abused: Not on file    Forced sexual activity: Not on file  Other Topics Concern   Not on file  Social History Narrative   Exercise--- walking     Review of Systems    General:  No chills, fever, night sweats or weight changes.  Cardiovascular:  No chest pain, dyspnea on exertion, edema, orthopnea, palpitations, paroxysmal nocturnal dyspnea. Dermatological: No rash, lesions/masses Respiratory: No cough, dyspnea Urologic: No hematuria, dysuria Abdominal:   No nausea, vomiting, diarrhea, bright red blood per rectum, melena, or hematemesis Neurologic:  No visual changes, wkns, changes in mental status. All other systems reviewed and are otherwise negative except as noted above.  Physical Exam    Blood pressure 101/88, pulse (!) 119, temperature 98.6 F (37 C), temperature source Oral, resp. rate (!) 27, height 5\' 8"  (1.727 m), weight 65.8 kg, last menstrual period 04/11/2011, SpO2 99 %.  General: Stress and in acute distress Psych: Shows Neuro: Alert and oriented X 3. Moves all extremities spontaneously. HEENT: Normal  Neck: Supple without bruits or JVD. Lungs:  Resp regular and unlabored, CTA. Heart: RRR no s3, s4, or murmurs. Abdomen: Soft, non-tender, non-distended, BS + x 4.  Extremities: No clubbing, cyanosis or edema. DP/PT/Radials 2+ and equal bilaterally.  Labs    Troponin (Point of Care Test) No results for input(s): TROPIPOC in the last 72 hours. No results for input(s): CKTOTAL, CKMB, TROPONINI in the last 72 hours. Lab  Results  Component Value Date   WBC 10.4 06/13/2018   HGB 11.2 (L) 06/13/2018   HCT 35.2 (L) 06/13/2018   MCV 87.1 06/13/2018   PLT 333 06/13/2018    Recent Labs  Lab 06/13/18 1319  NA 135  K 2.6*  CL 102  CO2 17*  BUN 5*  CREATININE 0.93  CALCIUM 8.5*  PROT 6.7  BILITOT 1.0  ALKPHOS 86  ALT 25  AST 31  GLUCOSE 138*   Lab Results  Component Value Date   CHOL 168 05/30/2018   HDL 64 05/30/2018   LDLCALC NOT CALCULATED 05/30/2018   TRIG 99 05/30/2018   No results found for: [facility]   Radiology Studies  Ct Abdomen Pelvis W Contrast  Result Date: 06/13/2018 CLINICAL DATA:  Acute onset of generalized abdominal pain, nausea and vomiting, productive cough, shortness of breath and fever up to 105 degrees Fahrenheit. EXAM: CT ABDOMEN AND PELVIS WITH CONTRAST TECHNIQUE: Multidetector CT imaging of the abdomen and pelvis was performed using the standard protocol following bolus administration of intravenous contrast. CONTRAST:  132mL OMNIPAQUE IOHEXOL 300 MG/ML IV. COMPARISON:  12/13/2015. FINDINGS: Lower chest: Linear atelectasis involving the LEFT LOWER LOBE. Visualized lung bases otherwise clear. Heart size upper normal to slightly enlarged. RIGHT coronary artery stent. Hepatobiliary: Liver normal in size and appearance. Gallbladder normal in appearance without calcified gallstones. No biliary ductal dilation. Pancreas: Normal in appearance without evidence of mass, ductal dilation, or inflammation. Spleen: Normal in size and appearance. Adrenals/Urinary Tract: Normal appearing adrenal glands. Large benign simple cyst arising from the LOWER pole of the RIGHT kidney measuring maximally 9.5 cm, increased in size since 2017 where it measured approximately 8 cm. No solid mass involving either kidney. No hydronephrosis. No urinary tract calculi. Normal appearing urinary bladder. Stomach/Bowel: Stomach normal in appearance for the degree of distention. Normal-appearing small bowel. Entire  colon decompressed, containing liquid stool. Opaque ingested material within the cecum and ascending colon. Mild edema in the fat adjacent to the cecum and ascending colon. Sigmoid colon diverticulosis without evidence of acute diverticulitis. Normal-appearing appendix low in the RIGHT side of the pelvis. Vascular/Lymphatic: Moderate aortoiliac atherosclerosis without evidence of aneurysm. No pathologic lymphadenopathy. Reproductive: Degenerating, partially calcified fibroid arising from the uterine fundus measuring approximately 4 cm. No adnexal masses. Other: None. 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 Facet degenerative changes at L4-5 and L5-S1. No acute findings. IMPRESSION: 1. Mild colitis involving the cecum and ascending colon. 2. Sigmoid colon diverticulosis without evidence of acute diverticulitis. 3. Approximate 4 cm degenerating, partially calcified uterine fundal fibroid. 4. Large approximate 9.5 cm benign simple cyst arising from the LOWER pole the RIGHT kidney. Cysts that are this large can occasionally be symptomatic-does the patient have RIGHT flank pain? Aortic Atherosclerosis (ICD10-170.0) Electronically Signed   By: Evangeline Dakin M.D.   On: 06/13/2018 16:33   Dg Chest Port 1 View  Result Date: 06/13/2018 CLINICAL DATA:  Acute onset of shortness of breath. EXAM: PORTABLE CHEST 1 VIEW COMPARISON:  CTA chest 06/03/2018 and earlier. Chest x-rays 06/01/2018 and earlier. FINDINGS: Cardiac silhouette mildly enlarged for AP portable technique, unchanged. Lungs clear. Bronchovascular markings normal. Pulmonary vascularity normal. No visible pleural effusions. No pneumothorax. IMPRESSION: Stable mild cardiomegaly. No acute cardiopulmonary disease. Electronically Signed   By: Evangeline Dakin M.D.   On: 06/13/2018 21:30   Dg Chest Port 1 View  Result Date: 06/01/2018 CLINICAL DATA:  Acute respiratory failure with hypoxia. EXAM:  PORTABLE CHEST 1 VIEW COMPARISON:  Chest x-ray dated May 30, 2018. FINDINGS: Unchanged endotracheal and enteric tubes. Unchanged right internal jugular Swan-Ganz catheter with the tip in the region of the distal right interlobar pulmonary artery. The heart size and mediastinal contours are within normal limits. Normal pulmonary vascularity. Improved aeration at the lung bases. No focal consolidation, pleural effusion, or pneumothorax. No acute osseous abnormality. IMPRESSION: 1. Improved aeration at the lung bases. 2. Unchanged right internal jugular Swan-Ganz catheter with tip in the region of the distal right interlobar pulmonary artery. Consider retracting at least 4 cm. Electronically Signed   By: Titus Dubin M.D.   On: 06/01/2018 07:44   Portable Chest X-ray  Result Date: 05/30/2018  CLINICAL DATA:  Intubation, history hypertension, lupus, smoker EXAM: PORTABLE CHEST 1 VIEW COMPARISON:  Portable exam 2030 hours compared to 05/30/2018 at 1015 hours FINDINGS: Tip of endotracheal tube projects 2.8 cm above carina. RIGHT jugular Swan-Ganz catheter with tip projecting over RIGHT lower lobe pulmonary artery. Nasogastric tube extends into stomach. Normal heart size, mediastinal contours, and pulmonary vascularity. Atherosclerotic calcification aorta. Bibasilar atelectasis greater on LEFT. No gross pleural effusion or pneumothorax. IMPRESSION: Line and tube positions as above. Bibasilar atelectasis greater on LEFT. Electronically Signed   By: Lavonia Dana M.D.   On: 05/30/2018 20:40   Dg Chest Portable 1 View  Result Date: 05/30/2018 CLINICAL DATA:  Chest pain EXAM: PORTABLE CHEST 1 VIEW COMPARISON:  01/12/2018 chest radiograph. FINDINGS: Stable cardiomediastinal silhouette with top-normal heart size. No pneumothorax. No pleural effusion. Lungs appear clear, with no acute consolidative airspace disease and no pulmonary edema. IMPRESSION: No active disease. Electronically Signed   By: Ilona Sorrel M.D.   On:  05/30/2018 10:48   Dg Abd Portable 1v  Result Date: 05/30/2018 CLINICAL DATA:  OG tube placement EXAM: PORTABLE ABDOMEN - 1 VIEW COMPARISON:  None. FINDINGS: OG tube tip is in the mid to distal stomach. Nonobstructive bowel gas pattern. IMPRESSION: OG tube tip in the mid to distal stomach. Electronically Signed   By: Rolm Baptise M.D.   On: 05/30/2018 20:15   Ct Angio Chest Aorta W/cm &/or Wo/cm  Result Date: 06/03/2018 CLINICAL DATA:  Abnormal CT at admission. EXAM: CT ANGIOGRAPHY CHEST WITH CONTRAST TECHNIQUE: Multidetector CT imaging of the chest was performed using the standard protocol during bolus administration of intravenous contrast. Multiplanar CT image reconstructions and MIPs were obtained to evaluate the vascular anatomy. CONTRAST:  9mL OMNIPAQUE IOHEXOL 350 MG/ML SOLN COMPARISON:  05/30/2018 FINDINGS: Cardiovascular: Again noted is abnormal soft tissue surrounding the distal ascending thoracic aorta and proximal aortic arch. This measures fluid density on today's study, question pericardial effusion in a recess. Small amount of pericardial fluid noted posterior to the left heart. There is cardiomegaly. Densely calcified right coronary artery. Scattered calcifications in the left anterior descending coronary artery. Aortic atherosclerosis. No evidence of aortic aneurysm. Scattered aortic calcifications. Mediastinum/Nodes: Soft tissue/fluid density adjacent to the distal ascending thoracic aorta and proximal aortic arch. This also extends adjacent to the main pulmonary artery raising the possibility of fluid within a pericardial recess. No adenopathy. Lungs/Pleura: Bilateral lower lobe airspace opacities posteriorly, likely atelectasis. Scattered patchy ground-glass opacities within the lungs, new since prior study could reflect areas of atelectasis or early edema or alveolitis. Trace bilateral effusions. Upper Abdomen: Imaging into the upper abdomen shows no acute findings. Musculoskeletal: Chest  wall soft tissues are unremarkable. No acute bony abnormality. Review of the MIP images confirms the above findings. IMPRESSION: Soft tissue again noted surrounding the distal ascending thoracic aorta and proximal aortic arch. This soft tissue now extends adjacent to the main pulmonary artery and measures fluid density. This raises the possibility of fluid within a pericardial recess. There is a small amount of pericardial fluid posterior to the left heart border. Cardiomegaly. Coronary artery disease. Trace bilateral effusions.  Bibasilar atelectasis. Aortic Atherosclerosis (ICD10-I70.0). Electronically Signed   By: Rolm Baptise M.D.   On: 06/03/2018 01:18   Ct Angio Chest/abd/pel For Dissection W And/or Wo Contrast  Result Date: 05/30/2018 CLINICAL DATA:  Severe chest pain radiating into the back with nausea and vomiting. Hypotension. EXAM: CT ANGIOGRAPHY CHEST, ABDOMEN AND PELVIS TECHNIQUE: Multidetector CT imaging through the chest, abdomen  and pelvis was performed using the standard protocol during bolus administration of intravenous contrast. Multiplanar reconstructed images and MIPs were obtained and reviewed to evaluate the vascular anatomy. CONTRAST:  171mL OMNIPAQUE IOHEXOL 350 MG/ML SOLN COMPARISON:  Chest radiograph from earlier today. 03/06/2018 screening chest CT. 12/13/2015 CT abdomen/pelvis. FINDINGS: CTA CHEST FINDINGS Cardiovascular: Mild cardiomegaly. No significant pericardial effusion/thickening. Left anterior descending and right coronary atherosclerosis. Atherosclerotic nonaneurysmal thoracic aorta. There is new wall thickening in the ascending thoracic aorta compared to 03/06/2018 screening chest CT study, which extends to the junction with the aortic arch and which measures complex fluid density, which may indicate acute intramural hematoma. No dissection flap within the thoracic aorta. No pseudoaneurysm. No penetrating atherosclerotic ulcer. Aortic arch branch vessels are patent. Normal  caliber pulmonary arteries. No central pulmonary emboli. Mediastinum/Nodes: No discrete thyroid nodules. Unremarkable esophagus. No pathologically enlarged axillary, mediastinal or hilar lymph nodes. Lungs/Pleura: No pneumothorax. No pleural effusion. Mild centrilobular and paraseptal emphysema with mild diffuse bronchial wall thickening. Low-attenuation material obstructs the left lower lobe bronchus. Subsegmental scarring versus atelectasis in the left lower lobe. No acute consolidative airspace disease, lung masses or significant pulmonary nodules. Musculoskeletal: No aggressive appearing focal osseous lesions. Mild thoracic spondylosis. Review of the MIP images confirms the above findings. CTA ABDOMEN AND PELVIS FINDINGS VASCULAR Aorta: Normal caliber aorta without aneurysm, dissection, vasculitis or significant stenosis. Celiac: Patent without evidence of aneurysm, dissection, vasculitis or significant stenosis. SMA: Patent without evidence of aneurysm, dissection, vasculitis or significant stenosis. Renals: Both renal arteries are patent without evidence of aneurysm, dissection, vasculitis, fibromuscular dysplasia or significant stenosis. IMA: Patent without evidence of aneurysm, dissection, vasculitis or significant stenosis. Inflow: Patent without evidence of aneurysm, dissection, vasculitis or significant stenosis. Veins: No obvious venous abnormality within the limitations of this arterial phase study. Review of the MIP images confirms the above findings. NON-VASCULAR Hepatobiliary: Normal liver with no liver mass. Normal gallbladder with no radiopaque cholelithiasis. No biliary ductal dilatation. Pancreas: Normal, with no mass or duct dilation. Spleen: Normal size. No mass. Adrenals/Urinary Tract: Normal adrenals. Simple 8.6 cm anterior lower right renal cyst. No hydronephrosis. Normal bladder. Stomach/Bowel: Normal non-distended stomach. Normal caliber small bowel with no small bowel wall thickening.  Normal appendix. Normal large bowel with no diverticulosis, large bowel wall thickening or pericolonic fat stranding. Vascular/Lymphatic: Atherosclerotic nonaneurysmal abdominal aorta. No pathologically enlarged lymph nodes in the abdomen or pelvis. Reproductive: Mildly enlarged myomatous uterus with calcified uterine fibroids. No adnexal masses. Other: No pneumoperitoneum, ascites or focal fluid collection. Musculoskeletal: No aggressive appearing focal osseous lesions. Moderate lower lumbar spondylosis. Review of the MIP images confirms the above findings. IMPRESSION: 1. New wall thickening in the ascending thoracic aorta compared to 03/06/2018 screening chest CT study, measuring complex fluid density, favored to represent acute aortic syndrome due to type A acute intramural hematoma. No visible dissection flap. No aneurysm or pseudoaneurysm. 2. Mild cardiomegaly.  Two-vessel coronary atherosclerosis. 3. Mild centrilobular and paraseptal emphysema with mild diffuse bronchial wall thickening, suggesting COPD. 4. Mildly enlarged calcified myomatous uterus. 5. Aortic Atherosclerosis (ICD10-I70.0) and Emphysema (ICD10-J43.9). Critical Value/emergent results were called by telephone at the time of interpretation on 05/30/2018 at 11:36 am to Dr. Virgel Manifold and to Dr. Serita Sheller in the cath lab, who verbally acknowledged these results. Electronically Signed   By: Ilona Sorrel M.D.   On: 05/30/2018 11:42    ECG & Cardiac Imaging    ST depressions anterior laterally, sinus tachycardia  Assessment & Plan    58 year old with  history of CAD status post PCI a month ago.  Presenting with acute GI symptoms and fever.  In the setting of tachycardia and severe hypotension patient has diffuse ST depressions.  Somewhat improved with improving blood pressure.  However ST depression still persistent heart rate of 120 and a normal blood pressure.  Patient is asymptomatic besides nausea and some upper abdominal pain.  ECG  likely secondary to hypertension and tachycardia unlikely to be an in-stent thrombosis.  Plan: -Aggressive fluid resuscitation to restore her blood pressure -Trend troponin and ECG -If her troponin to turn positive can consider heparin and aspirin. -Given her home meds that she missed today including the aspirin ticagrelor  Signed, Cristina Gong, MD 06/13/2018, 11:27 PM  For questions or updates, please contact   Please consult [email or web address] for contact info under Cardiology/STEMI.

## 2018-06-13 NOTE — ED Provider Notes (Signed)
[facility] EMERGENCY DEPARTMENT Provider Note   CSN: [id number] Arrival date & time: 06/13/18  1306    History   Chief Complaint Chief Complaint  Patient presents with  . Fever    HPI Samantha [name] is a 58 y.o. female.     HPI   58 year old female presents today with complaints of abdominal pain.  Patient notes that yesterday she developed fever, generalized abdominal pain, nausea vomiting and diarrhea.  She notes approximate 4 episodes of nonbloody diarrhea since last night, she notes several episodes of vomiting.  She notes the vomiting is only after eating, unable to tolerate any p.o.  She notes the abdominal pain is worse in the right lower abdomen.  She denies any vaginal discharge or bleeding.  She notes a very minimal dry cough with no associated shortness of breath; no chest pain.  Denies any close sick contacts, denies any abnormal food or drink, no recent travel, no known COVID interaction.  No recent antibiotics.  She does note urinary urgency but denies any dysuria, or change in color or odor.   Past Medical History:  Diagnosis Date  . Altered mental status   . Backache, unspecified   . Gastric ulcer, unspecified as acute or chronic, without mention of hemorrhage, perforation, or obstruction   . Helicobacter pylori (H. pylori)   . Hyperlipidemia   . Lupus ([place])   . STEMI (ST elevation myocardial infarction) ([place])    PCI to p/d RCA  . Tobacco abuse   . Tobacco use disorder 11/22/2011  . Unspecified essential hypertension     Patient Active Problem List   Diagnosis Date Noted  . CAD (coronary artery disease) 06/09/2018  . Abnormal chest CT 06/09/2018  . Transaminitis 06/09/2018  . Cardiogenic shock ([place]) 06/03/2018  . Acute respiratory failure ([place]) 06/03/2018  . Acute ST elevation myocardial infarction (STEMI) of inferior wall ([place]) 05/30/2018  . Acute ST elevation myocardial infarction (STEMI) involving right coronary artery ([place])   .  Chronic bilateral low back pain without sciatica 12/08/2017  . Hyperlipidemia LDL goal <70 02/11/2017  . Gastroesophageal reflux disease without esophagitis 02/11/2017  . HTN (hypertension) 06/23/2015  . Abdominal pain, epigastric 06/23/2015  . Polypharmacy 06/30/2012  . Achilles tendinitis 11/20/2011  . Lupus (systemic lupus erythematosus) ([place]) 08/22/2011  . Disseminated lupus erythematosus ([place]) 08/21/2011  . 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 Headache(784.0) 03/09/2011  . Back pain 03/09/2011  . Hyponatremia 03/09/2011  . Hypokalemia 03/09/2011    Past Surgical History:  Procedure Laterality Date  . CORONARY/GRAFT ACUTE MI REVASCULARIZATION N/A 05/30/2018   Procedure: Coronary/Graft Acute MI Revascularization;  Surgeon: Burnell Blanks, MD;  Location: [facility] CV LAB;  Service: Cardiovascular;  Laterality: N/A;  . INTRAOPERATIVE TRANSESOPHAGEAL ECHOCARDIOGRAM N/A 05/30/2018   Procedure: Transesophageal Echocardiogram;  Surgeon: Grace Isaac, MD;  Location: [facility];  Service: Vascular;  Laterality: N/A;  . LEFT HEART CATH AND CORONARY ANGIOGRAPHY N/A 05/30/2018   Procedure: LEFT HEART CATH AND CORONARY ANGIOGRAPHY;  Surgeon: Burnell Blanks, MD;  Location: [facility] CV LAB;  Service: Cardiovascular;  Laterality: N/A;  . UPPER GASTROINTESTINAL ENDOSCOPY  2013     OB History   No obstetric history on file.      Home Medications    Prior to Admission medications   Medication Sig Start Date End Date Taking? Authorizing Provider  acetaminophen (TYLENOL) 500 MG tablet Take 500 mg by mouth every 6 (six) hours as needed for moderate pain  or headache.    [provider]  aspirin EC 81 MG tablet Take 1 tablet (81 mg total) by mouth daily. 08/05/16   [name], DO  atorvastatin (LIPITOR) 80 MG tablet Take 1 tablet (80 mg total) by mouth daily at 6 PM. 06/10/18   Imogene Burn, PA-C  cyclobenzaprine (FLEXERIL) 10 MG tablet TAKE 1  TABLET(10 MG) BY MOUTH THREE TIMES DAILY AS NEEDED 06/11/18   [name], DO  hydroxychloroquine (PLAQUENIL) 200 MG tablet Take 200 mg by mouth as directed. Take 1 tablet one day and 2 the next day.    [provider]  metoprolol tartrate (LOPRESSOR) 50 MG tablet Take 1 tablet (50 mg total) by mouth 2 (two) times daily. 06/10/18   Imogene Burn, PA-C  Multiple Vitamin (MULTIVITAMIN) tablet Take 1 tablet by mouth daily.    [provider]  mycophenolate (CELLCEPT) 500 MG tablet Take 1,500 mg by mouth 2 (two) times daily.     [provider]  nitroGLYCERIN (NITROSTAT) 0.4 MG SL tablet Place 1 tablet (0.4 mg total) under the tongue every 5 (five) minutes as needed for chest pain. 06/10/18   Imogene Burn, PA-C  pantoprazole (PROTONIX) 20 MG tablet Take 1 tablet (20 mg total) by mouth daily. 06/10/18   Imogene Burn, PA-C  spironolactone (ALDACTONE) 25 MG tablet Take 1 tablet (25 mg total) by mouth every other day. 06/10/18   Imogene Burn, PA-C  ticagrelor (BRILINTA) 90 MG TABS tablet Take 1 tablet (90 mg total) by mouth 2 (two) times daily. 06/10/18   Imogene Burn, PA-C    Family History Family History  Problem Relation Age of Onset  . Diabetes Sister   . Hypertension Mother   . Colon cancer Neg Hx   . Stomach cancer Neg Hx     Social History Social History   Tobacco Use  . Smoking status: Former Smoker    Packs/day: 0.75    Years: 40.00    Pack years: 30.00    Types: Cigarettes  . Smokeless tobacco: Never Used  . Tobacco comment: 1 cig a day  Substance Use Topics  . Alcohol use: Yes    Comment: occassional; beer   . Drug use: No     Allergies   Tramadol   Review of Systems Review of Systems  All other systems reviewed and are negative.    Physical Exam Updated Vital Signs BP 95/61 (BP Location: Left Arm)   Pulse (!) 111   Temp (!) 102.4 F (39.1 C) (Rectal)   Resp (!) 28   LMP 04/11/2011   SpO2 97%   Physical  Exam Vitals signs and nursing note reviewed.  Constitutional:      Appearance: She is well-developed.  HENT:     Head: Normocephalic and atraumatic.     Comments: Oropharynx is clear no erythema Eyes:     General: No scleral icterus.       Right eye: No discharge.        Left eye: No discharge.     Conjunctiva/sclera: Conjunctivae normal.     Pupils: Pupils are equal, round, and reactive to light.  Neck:     Musculoskeletal: Normal range of motion.     Vascular: No JVD.     Trachea: No tracheal deviation.  Cardiovascular:     Rate and Rhythm: Regular rhythm. Tachycardia present.  Pulmonary:     Effort: Pulmonary effort is normal. No respiratory distress.  Breath sounds: Normal breath sounds. No stridor. No wheezing, rhonchi or rales.  Abdominal:     Comments: Exquisite tenderness to the abdomen worse in the right lower quadrant  Neurological:     Mental Status: She is alert and oriented to person, place, and time.     Coordination: Coordination normal.  Psychiatric:        Behavior: Behavior normal.        Thought Content: Thought content normal.        Judgment: Judgment normal.      ED Treatments / Results  Labs (all labs ordered are listed, but only abnormal results are displayed) Labs Reviewed  COMPREHENSIVE METABOLIC PANEL - Abnormal; Notable for the following components:      Result Value   Potassium 2.6 (*)    CO2 17 (*)    Glucose, Bld 138 (*)    BUN 5 (*)    Calcium 8.5 (*)    Albumin 2.5 (*)    Anion gap 16 (*)    All other components within normal limits  CBC WITH DIFFERENTIAL/PLATELET - Abnormal; Notable for the following components:   Hemoglobin 11.2 (*)    HCT 35.2 (*)    Neutro Abs 9.0 (*)    All other components within normal limits  SARS CORONAVIRUS 2 (HOSPITAL ORDER, [facility])  CULTURE, BLOOD (ROUTINE X 2)  CULTURE, BLOOD (ROUTINE X 2)  LACTIC ACID, PLASMA  LACTIC ACID, PLASMA  URINALYSIS, ROUTINE W REFLEX  MICROSCOPIC  MAGNESIUM    EKG EKG Interpretation  Date/Time:  Saturday Jun 13 2018 13:14:33 EDT Ventricular Rate:  127 PR Interval:    QRS Duration: 79 QT Interval:  275 QTC Calculation: 400 R Axis:   -43 Text Interpretation:  Sinus tachycardia LVH with secondary repolarization abnormality Nonspecific ST abnormality Nonspecific T wave abnormality Confirmed by Lajean Saver [phone]) on 06/13/2018 1:57:16 PM Also confirmed by Lajean Saver [phone]), editor Philomena Doheny [phone])  on 06/13/2018 2:36:12 PM   Radiology No results found.  Procedures Procedures (including critical care time)  Medications Ordered in ED Medications  potassium chloride 10 mEq in 100 mL IVPB (10 mEq Intravenous New Bag/Given 06/13/18 1529)  potassium chloride 10 mEq in 100 mL IVPB (has no administration in time range)  piperacillin-tazobactam (ZOSYN) IVPB 3.375 g (has no administration in time range)  sodium chloride 0.9 % bolus 1,000 mL (has no administration in time range)  ondansetron (ZOFRAN) injection 4 mg (4 mg Intravenous Given 06/13/18 1408)  sodium chloride 0.9 % bolus 1,000 mL (0 mLs Intravenous Stopped 06/13/18 1612)  iohexol (OMNIPAQUE) 300 MG/ML solution 100 mL (100 mLs Intravenous Contrast Given 06/13/18 1602)  acetaminophen (TYLENOL) tablet 1,000 mg (1,000 mg Oral Given 06/13/18 1610)     Initial Impression / Assessment and Plan / ED Course  I have reviewed the triage vital signs and the nursing notes.  Pertinent labs & imaging results that were available during my care of the patient were reviewed by me and considered in my medical decision making (see chart for details).        Labs: Blood cultures, CBC, CMP, lactic acid, magnesium, SARS coronavirus 2, urinalysis  Imaging: The abdomen pelvis with contrast  Consults:  Therapeutics: Saline  Discharge Meds:   Assessment/Plan: 58 year old female with nausea vomiting diarrhea abdominal pain.  Patient is a significant fever and  tachycardia.  Concern for intra-abdominal pathology, CT abdomen pelvis ordered.  COVID lab ordered in addition.  Patient care signed to  oncoming provider pending CT and disposition.   Final Clinical Impressions(s) / ED Diagnoses   Final diagnoses:  Generalized abdominal pain    ED Discharge Orders    None       Francee Gentile 06/13/18 1617    Lajean Saver, MD 06/14/18 1153

## 2018-06-14 DIAGNOSIS — I10 Essential (primary) hypertension: Secondary | ICD-10-CM

## 2018-06-14 DIAGNOSIS — R935 Abnormal findings on diagnostic imaging of other abdominal regions, including retroperitoneum: Secondary | ICD-10-CM

## 2018-06-14 DIAGNOSIS — R7881 Bacteremia: Secondary | ICD-10-CM

## 2018-06-14 DIAGNOSIS — I2119 ST elevation (STEMI) myocardial infarction involving other coronary artery of inferior wall: Secondary | ICD-10-CM

## 2018-06-14 DIAGNOSIS — I2510 Atherosclerotic heart disease of native coronary artery without angina pectoris: Secondary | ICD-10-CM

## 2018-06-14 LAB — GASTROINTESTINAL PANEL BY PCR, STOOL (REPLACES STOOL CULTURE)

## 2018-06-14 LAB — BLOOD CULTURE ID PANEL (REFLEXED)

## 2018-06-14 LAB — CBC
HCT: 31.7 % — ABNORMAL LOW (ref 36.0–46.0)
Hemoglobin: 10 g/dL — ABNORMAL LOW (ref 12.0–15.0)
MCH: 27.4 pg (ref 26.0–34.0)
MCHC: 31.5 g/dL (ref 30.0–36.0)
MCV: 86.8 fL (ref 80.0–100.0)
Platelets: 280 10*3/uL (ref 150–400)
RBC: 3.65 MIL/uL — ABNORMAL LOW (ref 3.87–5.11)
RDW: 13.5 % (ref 11.5–15.5)
WBC: 12 10*3/uL — ABNORMAL HIGH (ref 4.0–10.5)
nRBC: 0 % (ref 0.0–0.2)

## 2018-06-14 LAB — COMPREHENSIVE METABOLIC PANEL
ALT: 23 U/L (ref 0–44)
AST: 28 U/L (ref 15–41)
Albumin: 2.2 g/dL — ABNORMAL LOW (ref 3.5–5.0)
Alkaline Phosphatase: 77 U/L (ref 38–126)
Anion gap: 9 (ref 5–15)
BUN: <5 mg/dL — ABNORMAL LOW (ref 6–20)
CO2: 21 mmol/L — ABNORMAL LOW (ref 22–32)
Calcium: 7.8 mg/dL — ABNORMAL LOW (ref 8.9–10.3)
Chloride: 113 mmol/L — ABNORMAL HIGH (ref 98–111)
Creatinine, Ser: 0.72 mg/dL (ref 0.44–1.00)
GFR calc Af Amer: >60 mL/min (ref >60)
GFR calc non Af Amer: >60 mL/min (ref >60)
Glucose, Bld: 104 mg/dL — ABNORMAL HIGH (ref 70–99)
Potassium: 3 mmol/L — ABNORMAL LOW (ref 3.5–5.1)
Sodium: 143 mmol/L (ref 135–145)
Total Bilirubin: 0.9 mg/dL (ref 0.3–1.2)
Total Protein: 6 g/dL — ABNORMAL LOW (ref 6.5–8.1)

## 2018-06-14 LAB — TROPONIN I: Troponin I: 0.22 ng/mL (ref <0.03)

## 2018-06-14 LAB — C DIFFICILE QUICK SCREEN W PCR REFLEX
C Diff antigen: NEGATIVE
C Diff interpretation: NOT DETECTED
C Diff toxin: NEGATIVE

## 2018-06-14 LAB — HIV ANTIBODY (ROUTINE TESTING W REFLEX): HIV Screen 4th Generation wRfx: NONREACTIVE

## 2018-06-14 LAB — MRSA PCR SCREENING: MRSA by PCR: NEGATIVE

## 2018-06-14 LAB — HEPARIN LEVEL (UNFRACTIONATED): Heparin Unfractionated: 0.47 IU/mL (ref 0.30–0.70)

## 2018-06-14 LAB — LIPASE, BLOOD: Lipase: 27 U/L (ref 11–51)

## 2018-06-14 MED ORDER — HEPARIN (PORCINE) 25000 UT/250ML-% IV SOLN
800.0000 [IU]/h | INTRAVENOUS | Status: DC
  Administered 2018-06-14: 800 [IU]/h via INTRAVENOUS
  Filled 2018-06-14: qty 250

## 2018-06-14 MED ORDER — POTASSIUM CHLORIDE CRYS ER 20 MEQ PO TBCR
40.0000 meq | EXTENDED_RELEASE_TABLET | Freq: Once | ORAL | Status: AC
  Administered 2018-06-14: 40 meq via ORAL
  Filled 2018-06-14: qty 2

## 2018-06-14 MED ORDER — HEPARIN BOLUS VIA INFUSION
1000.0000 [IU] | Freq: Once | INTRAVENOUS | Status: AC
  Administered 2018-06-14: 1000 [IU] via INTRAVENOUS
  Filled 2018-06-14: qty 1000

## 2018-06-14 MED ORDER — ASPIRIN 325 MG PO TABS
325.0000 mg | ORAL_TABLET | Freq: Once | ORAL | Status: AC
  Administered 2018-06-14: 325 mg via ORAL
  Filled 2018-06-14: qty 1

## 2018-06-14 MED ORDER — CHLORHEXIDINE GLUCONATE CLOTH 2 % EX PADS
6.0000 | MEDICATED_PAD | Freq: Every day | CUTANEOUS | Status: DC
  Administered 2018-06-14 – 2018-06-15 (×2): 6 via TOPICAL

## 2018-06-14 MED ORDER — SODIUM CHLORIDE 0.9 % IV SOLN
INTRAVENOUS | Status: DC
  Administered 2018-06-14: 14:00:00 via INTRAVENOUS

## 2018-06-14 MED ORDER — METOPROLOL TARTRATE 12.5 MG HALF TABLET
12.5000 mg | ORAL_TABLET | Freq: Two times a day (BID) | ORAL | Status: DC
  Administered 2018-06-14 – 2018-06-16 (×5): 12.5 mg via ORAL
  Filled 2018-06-14 (×5): qty 1

## 2018-06-14 MED ORDER — MUPIROCIN 2 % EX OINT: 1.0000 "application " | TOPICAL_OINTMENT | Freq: Two times a day (BID) | CUTANEOUS | Status: DC

## 2018-06-14 NOTE — Progress Notes (Signed)
ANTICOAGULATION CONSULT NOTE - Initial Consult  Pharmacy Consult for heparin Indication: chest pain/ACS  Allergies  Allergen Reactions  . Tramadol Nausea Only    Feels weirded out    Patient Measurements: Height: 5\' 8"  (172.7 cm) Weight: 151 lb 7.3 oz (68.7 kg) IBW/kg (Calculated) : 63.9  Vital Signs: Temp: 97.7 F (36.5 C) (05/17 0451) Temp Source: Oral (05/17 0451) BP: 96/60 (05/17 0451) Pulse Rate: 119 (05/16 2248)  Labs: Recent Labs    06/11/18 1022 06/13/18 1319 06/14/18 0015 06/14/18 0821  HGB 12.2 11.2* 10.0*  --   HCT 36.8 35.2* 31.7*  --   PLT 430 333 280  --   HEPARINUNFRC  --   --   --  0.47  CREATININE 0.61 0.93 0.72  --   TROPONINI  --   --  0.22*  --     Estimated Creatinine Clearance: 79.2 mL/min (by C-G formula based on SCr of 0.72 mg/dL).   Medical History: Past Medical History:  Diagnosis Date  . Altered mental status   . Backache, unspecified   . Gastric ulcer, unspecified as acute or chronic, without mention of hemorrhage, perforation, or obstruction   . Helicobacter pylori (H. pylori)   . Hyperlipidemia   . Lupus ([place])   . STEMI (ST elevation myocardial infarction) ([place])    PCI to p/d RCA  . Tobacco abuse   . Tobacco use disorder 11/22/2011  . Unspecified essential hypertension     Medications:  Medications Prior to Admission  Medication Sig Dispense Refill Last Dose  . acetaminophen (TYLENOL) 500 MG tablet Take 500 mg by mouth every 6 (six) hours as needed for moderate pain or headache.   Taking  . aspirin EC 81 MG tablet Take 1 tablet (81 mg total) by mouth daily.   Taking  . atorvastatin (LIPITOR) 80 MG tablet Take 1 tablet (80 mg total) by mouth daily at 6 PM. 90 tablet 3 Taking  . cyclobenzaprine (FLEXERIL) 10 MG tablet TAKE 1 TABLET(10 MG) BY MOUTH THREE TIMES DAILY AS NEEDED (Patient taking differently: Take 10 mg by mouth 3 (three) times daily as needed for muscle spasms. ) 30 tablet 0   . hydroxychloroquine (PLAQUENIL) 200  MG tablet Take 200 mg by mouth as directed. Take 1 tablet one day and 2 the next day.   Taking  . metoprolol succinate (TOPROL-XL) 100 MG 24 hr tablet Take 100 mg by mouth daily.   5/15?  Marland Kitchen metoprolol tartrate (LOPRESSOR) 50 MG tablet Take 1 tablet (50 mg total) by mouth 2 (two) times daily. 180 tablet 3 Taking  . Multiple Vitamin (MULTIVITAMIN) tablet Take 1 tablet by mouth daily.   Taking  . mycophenolate (CELLCEPT) 500 MG tablet Take 1,500 mg by mouth 2 (two) times daily. For lupus   Taking  . nitroGLYCERIN (NITROSTAT) 0.4 MG SL tablet Place 1 tablet (0.4 mg total) under the tongue every 5 (five) minutes as needed for chest pain. 25 tablet 2 Taking  . pantoprazole (PROTONIX) 20 MG tablet Take 1 tablet (20 mg total) by mouth daily. 90 tablet 3 5/15?  Marland Kitchen spironolactone (ALDACTONE) 25 MG tablet Take 1 tablet (25 mg total) by mouth every other day. 90 tablet 3 Taking  . ticagrelor (BRILINTA) 90 MG TABS tablet Take 1 tablet (90 mg total) by mouth 2 (two) times daily. 180 tablet 3 5/15?   Scheduled:  . aspirin EC  81 mg Oral Daily  . atorvastatin  80 mg Oral q1800  . Chlorhexidine Gluconate  Cloth  6 each Topical [id number]  . hydroxychloroquine  200 mg Oral QODAY  . [START ON 06/15/2018] hydroxychloroquine  400 mg Oral QODAY  . mycophenolate  1,500 mg Oral BID  . potassium chloride  40 mEq Oral Once  . sodium chloride flush  3 mL Intravenous Q12H  . ticagrelor  90 mg Oral BID   Infusions:  . 0.9 % NaCl with KCl 40 mEq / L 125 mL/hr (06/14/18 0044)  . heparin 800 Units/hr (06/14/18 0431)  . piperacillin-tazobactam (ZOSYN)  IV 3.375 g (06/14/18 0159)    Assessment: 58yo female admitted 5/2 for STEMI, had DES placed x2 and discharged, now presents with CP though not comparable to STEMI pain, troponin mildly elevated (0.22, peaked at >65 during STEMI admission). Pharmacy consulted to initiate heparin gtt.  This AM patients heparin level therapeutic at 0.47. CBC with Hgb 10, and pltc wnl. No bruising  or bleeding noted per RN.   Goal of Therapy:  Heparin level 0.3-0.7 units/ml Monitor platelets by anticoagulation protocol: Yes   Plan:  Continue heparin at 800 units/hr Obtain confirmatory heparin level in 6 hours Daily heparin level and CBC Monitor for s/sx of bleeding  Thank you for allowing pharmacy to be a part of this patient's care.  Leron Croak, PharmD PGY1 Pharmacy Resident Phone: [phone]  Please check AMION for all [facility] phone numbers 06/14/2018,9:02 AM

## 2018-06-14 NOTE — Progress Notes (Signed)
PHARMACY - PHYSICIAN COMMUNICATION CRITICAL VALUE ALERT - BLOOD CULTURE IDENTIFICATION (BCID)  Samantha [name] is an 58 y.o. female who presented to [facility] on 06/13/2018 with a chief complaint of N/V and right flank pain.  Assessment:  BCx 1/4 e. Coli, here for presumed colitis on empiric Zosyn  Name of physician (or Provider) Contacted: [place]  Current antibiotics: Zosyn  Changes to prescribed antibiotics recommended:  None  Results for orders placed or performed during the hospital encounter of 06/13/18  Blood Culture ID Panel (Reflexed) (Collected: 06/13/2018  1:19 PM)  Result Value Ref Range   Enterococcus species NOT DETECTED NOT DETECTED   Listeria monocytogenes NOT DETECTED NOT DETECTED   Staphylococcus species NOT DETECTED NOT DETECTED   Staphylococcus aureus (BCID) NOT DETECTED NOT DETECTED   Streptococcus species NOT DETECTED NOT DETECTED   Streptococcus agalactiae NOT DETECTED NOT DETECTED   Streptococcus pneumoniae NOT DETECTED NOT DETECTED   Streptococcus pyogenes NOT DETECTED NOT DETECTED   Acinetobacter baumannii NOT DETECTED NOT DETECTED   Enterobacteriaceae species DETECTED (A) NOT DETECTED   Enterobacter cloacae complex NOT DETECTED NOT DETECTED   Escherichia coli DETECTED (A) NOT DETECTED   Klebsiella oxytoca NOT DETECTED NOT DETECTED   Klebsiella pneumoniae NOT DETECTED NOT DETECTED   Proteus species NOT DETECTED NOT DETECTED   Serratia marcescens NOT DETECTED NOT DETECTED   Carbapenem resistance NOT DETECTED NOT DETECTED   Haemophilus influenzae NOT DETECTED NOT DETECTED   Neisseria meningitidis NOT DETECTED NOT DETECTED   Pseudomonas aeruginosa NOT DETECTED NOT DETECTED   Candida albicans NOT DETECTED NOT DETECTED   Candida glabrata NOT DETECTED NOT DETECTED   Candida krusei NOT DETECTED NOT DETECTED   Candida parapsilosis NOT DETECTED NOT DETECTED   Candida tropicalis NOT DETECTED NOT DETECTED    Bertis Ruddy 06/14/2018  6:04 AM

## 2018-06-14 NOTE — Progress Notes (Signed)
Progress Note  Patient Name: Samantha [name] Date of Encounter: 06/14/2018  Primary Cardiologist: Lauree Chandler, MD    Subjective   Abdomen feels better no chest pain   Inpatient Medications    Scheduled Meds:  aspirin EC  81 mg Oral Daily   atorvastatin  80 mg Oral q1800   Chlorhexidine Gluconate Cloth  6 each Topical Q0600   hydroxychloroquine  200 mg Oral QODAY   [START ON 06/15/2018] hydroxychloroquine  400 mg Oral QODAY   mycophenolate  1,500 mg Oral BID   potassium chloride  40 mEq Oral Once   sodium chloride flush  3 mL Intravenous Q12H   ticagrelor  90 mg Oral BID   Continuous Infusions:  0.9 % NaCl with KCl 40 mEq / L 125 mL/hr (06/14/18 0044)   heparin 800 Units/hr (06/14/18 0431)   piperacillin-tazobactam (ZOSYN)  IV 3.375 g (06/14/18 0159)   PRN Meds: acetaminophen **OR** acetaminophen, ondansetron **OR** ondansetron (ZOFRAN) IV   Vital Signs    Vitals:   06/14/18 0305 06/14/18 0320 06/14/18 0335 06/14/18 0451  BP: 92/61 (!) 84/60 95/63 96/60   Pulse:      Resp: (!) 22 (!) 21 19 (!) 21  Temp: 98.6 F (37 C)   97.7 F (36.5 C)  TempSrc: Oral   Oral  SpO2:      Weight:      Height:        Intake/Output Summary (Last 24 hours) at 06/14/2018 0902 Last data filed at 06/14/2018 0533 Gross per 24 hour  Intake 2108 ml  Output 2 ml  Net 2106 ml   Last 3 Weights 06/13/2018 06/13/2018 06/11/2018  Weight (lbs) 151 lb 7.3 oz 145 lb 145 lb  Weight (kg) 68.7 kg 65.772 kg 65.772 kg      Telemetry    NSR  - Personally Reviewed  ECG    SR old / subacute IMI  - Personally Reviewed  Physical Exam  Thin black female  GEN: No acute distress.   Neck: No JVD Cardiac: RRR, no murmurs, rubs, or gallops.  Respiratory: Clear to auscultation bilaterally. GI: Soft, nontender, non-distended  MS: No edema; No deformity. Neuro:  Nonfocal  Psych: Normal affect   Labs    Chemistry Recent Labs  Lab 06/11/18 1022 06/13/18 1319  06/14/18 0015  NA 142 135 143  K 4.4 2.6* 3.0*  CL 101 102 113*  CO2 20 17* 21*  GLUCOSE 90 138* 104*  BUN 8 5* <5*  CREATININE 0.61 0.93 0.72  CALCIUM 9.5 8.5* 7.8*  PROT 7.6 6.7 6.0*  ALBUMIN 4.0 2.5* 2.2*  AST 38 31 28  ALT 30 25 23   ALKPHOS 98 86 77  BILITOT 0.3 1.0 0.9  GFRNONAA 102 >60 >60  GFRAA 117 >60 >60  ANIONGAP  --  16* 9     Hematology Recent Labs  Lab 06/11/18 1022 06/13/18 1319 06/14/18 0015  WBC 8.5 10.4 12.0*  RBC 4.36 4.04 3.65*  HGB 12.2 11.2* 10.0*  HCT 36.8 35.2* 31.7*  MCV 84 87.1 86.8  MCH 28.0 27.7 27.4  MCHC 33.2 31.8 31.5  RDW 12.7 13.3 13.5  PLT 430 333 280    Cardiac Enzymes Recent Labs  Lab 06/14/18 0015  TROPONINI 0.22*   No results for input(s): TROPIPOC in the last 168 hours.   BNPNo results for input(s): BNP, PROBNP in the last 168 hours.   DDimer No results for input(s): DDIMER in the last 168 hours.   Radiology  Ct Abdomen Pelvis W Contrast  Result Date: 06/13/2018 CLINICAL DATA:  Acute onset of generalized abdominal pain, nausea and vomiting, productive cough, shortness of breath and fever up to 105 degrees Fahrenheit. EXAM: CT ABDOMEN AND PELVIS WITH CONTRAST TECHNIQUE: Multidetector CT imaging of the abdomen and pelvis was performed using the standard protocol following bolus administration of intravenous contrast. CONTRAST:  198mL OMNIPAQUE IOHEXOL 300 MG/ML IV. COMPARISON:  12/13/2015. FINDINGS: Lower chest: Linear atelectasis involving the LEFT LOWER LOBE. Visualized lung bases otherwise clear. Heart size upper normal to slightly enlarged. RIGHT coronary artery stent. Hepatobiliary: Liver normal in size and appearance. Gallbladder normal in appearance without calcified gallstones. No biliary ductal dilation. Pancreas: Normal in appearance without evidence of mass, ductal dilation, or inflammation. Spleen: Normal in size and appearance. Adrenals/Urinary Tract: Normal appearing adrenal glands. Large benign simple cyst  arising from the LOWER pole of the RIGHT kidney measuring maximally 9.5 cm, increased in size since 2017 where it measured approximately 8 cm. No solid mass involving either kidney. No hydronephrosis. No urinary tract calculi. Normal appearing urinary bladder. Stomach/Bowel: Stomach normal in appearance for the degree of distention. Normal-appearing small bowel. Entire colon decompressed, containing liquid stool. Opaque ingested material within the cecum and ascending colon. Mild edema in the fat adjacent to the cecum and ascending colon. Sigmoid colon diverticulosis without evidence of acute diverticulitis. Normal-appearing appendix low in the RIGHT side of the pelvis. Vascular/Lymphatic: Moderate aortoiliac atherosclerosis without evidence of aneurysm. No pathologic lymphadenopathy. Reproductive: Degenerating, partially calcified fibroid arising from the uterine fundus measuring approximately 4 cm. No adnexal masses. Other: None. 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 Facet degenerative changes at L4-5 and L5-S1. No acute findings. IMPRESSION: 1. Mild colitis involving the cecum and ascending colon. 2. Sigmoid colon diverticulosis without evidence of acute diverticulitis. 3. Approximate 4 cm degenerating, partially calcified uterine fundal fibroid. 4. Large approximate 9.5 cm benign simple cyst arising from the LOWER pole the RIGHT kidney. Cysts that are this large can occasionally be symptomatic-does the patient have RIGHT flank pain? Aortic Atherosclerosis (ICD10-170.0) Electronically Signed   By: Evangeline Dakin M.D.   On: 06/13/2018 16:33   Dg Chest Port 1 View  Result Date: 06/13/2018 CLINICAL DATA:  Acute onset of shortness of breath. EXAM: PORTABLE CHEST 1 VIEW COMPARISON:  CTA chest 06/03/2018 and earlier. Chest x-rays 06/01/2018 and earlier. FINDINGS: Cardiac silhouette mildly enlarged for AP portable technique, unchanged. Lungs clear.  Bronchovascular markings normal. Pulmonary vascularity normal. No visible pleural effusions. No pneumothorax. IMPRESSION: Stable mild cardiomegaly. No acute cardiopulmonary disease. Electronically Signed   By: Evangeline Dakin M.D.   On: 06/13/2018 21:30    Cardiac Studies   Cath 05/30/18 stent to RCA no MR normal EF   Patient Profile     58 y.o. female admitted with hypotension fever ? Infectious GI illness   Assessment & Plan    CAD:  Stent to RCA 05/30/18 ECG is stable and evolved no new ST elevation Troponin way down from acute event >65 now 0.22 no rebound No chest pain Will order echo to r/o mechanical complication of MI like effusion or MR but no murmur on exam Continue DAT with asa and brilinta. Will resume lower dose beta blocker in am 25 bid  HLD:  On statin   GI:  W/u per primary service benign exam today        For questions or updates, please contact [place] Please consult [email or web address] for contact info  under        Signed, Jenkins Rouge, MD  06/14/2018, 9:02 AM

## 2018-06-14 NOTE — Consult Note (Addendum)
Referring Provider: Dr. Oren Binet  Primary Care Physician:  Carollee Herter, Alferd Apa, DO Primary Gastroenterologist:  Dr. Loletha Carrow   Reason for Consultation: abdominal pain, diarrhea   HPI: Samantha [name] is a 58 y.o. female with a PMH CAD, STEMI MI with PCI to RCA 05/30/2018, Lupus, colon polyps, GERD and gastric ulcer. She presented to the ED with nausea, vomiting, diarrhea, fever and chills which started on Friday 5/15. No specific food triggers. She developed generalized mid abdominal pain, vomited 7 -8 times and had diarrhea x 3 to 4 episodes. Diarrhea was described as watery brown in colon, no blood. She was unable to keep any food down. Saturday her N/V and diarrhea persisted. She presented to the ED 06/13/2018 for further evaluation. She was admitted to the hospital 05/30/2018 with chest pain. She was diagnosed with a STEMI MI s/p PCI to the RCA. She was discharged home 5/6 on Brilinta 65m two tabs daily. She takes ASA 837mdaily. No recent antibiotics. She underwent a colonoscopy by Dr. KaDeatra Ina/15/2016 which showed a TA polyp at the splenic flexure, 3 hyperplastic polyps to the sigmoid colon and diverticulosis to the descending colon. EGD 04/10/2011 was normal.   ED Course: Na 143. K 3.0. BUN < 5. Cr. 0.72. Alk phos 77. Albumin 2.2. Lipase 27. AST 28. ALT 23. T. Bili 0.9. Troponin 0.22. WBC 12. Hg 10.1. HCT 31.7. PLT 280. GI panel and C. Diff results pending.  Abd/pelvic CT: Entire colon decompressed, containing liquid stool. Opaque ingested material within the cecum and ascending colon. Mild edema in the fat adjacent to the cecum and ascending colon. Sigmoid colon diverticulosis without evidence of acute diverticulitis. Normal-appearing appendix   She was seen by cardiology due to tachycardia, hypotension and diffuse ST depression. To follow Troponin levels. She was started on Heparin IV.  Currently, she remains NPO except ice chips. Her abdominal pain is much less today. No further  nausea or  Vomiting. She had one episode of brown watery diarrhea this morning, reported was small quantity. She complains of a cough, clear to white phlegm. Chest Xray 5/16 showed mild cardiomegaly, no pulmonary abnormality.     Past Medical History:  Diagnosis Date  . Altered mental status   . Backache, unspecified   . Gastric ulcer, unspecified as acute or chronic, without mention of hemorrhage, perforation, or obstruction   . Helicobacter pylori (H. pylori)   . Hyperlipidemia   . [place]  . [place]   PCI to p/d RCA  . Tobacco abuse   . Tobacco use disorder 11/22/2011  . Unspecified essential hypertension     Past Surgical History:  Procedure Laterality Date  . CORONARY/GRAFT ACUTE MI REVASCULARIZATION N/A 05/30/2018   Procedure: Coronary/Graft Acute MI Revascularization;  Surgeon: McBurnell BlanksMD;  [facility];  Service: Cardiovascular;  Laterality: N/A;  . INTRAOPERATIVE TRANSESOPHAGEAL ECHOCARDIOGRAM N/A 05/30/2018   Procedure: Transesophageal Echocardiogram;  Surgeon: GeGrace IsaacMD;  [facility];  Service: Vascular;  Laterality: N/A;  . LEFT HEART CATH AND CORONARY ANGIOGRAPHY N/A 05/30/2018   Procedure: LEFT HEART CATH AND CORONARY ANGIOGRAPHY;  Surgeon: McBurnell BlanksMD;  [facility];  Service: Cardiovascular;  Laterality: N/A;  . UPPER GASTROINTESTINAL ENDOSCOPY  2013    Prior to Admission medications   Medication Sig Start Date End Date Taking? Authorizing Provider  acetaminophen (TYLENOL) 500 MG tablet Take 500 mg by mouth every 6 (  six) hours as needed for moderate pain or headache.    [provider]  aspirin EC 81 MG tablet Take 1 tablet (81 mg total) by mouth daily. 08/05/16   [name], DO  atorvastatin (LIPITOR) 80 MG tablet Take 1 tablet (80 mg total) by mouth daily at 6 PM. 06/10/18   Bonnell Public, Jennye Moccasin, PA-C  cyclobenzaprine (FLEXERIL)  10 MG tablet TAKE 1 TABLET(10 MG) BY MOUTH THREE TIMES DAILY AS NEEDED Patient taking differently: Take 10 mg by mouth 3 (three) times daily as needed for muscle spasms.  06/11/18   [name], DO  hydroxychloroquine (PLAQUENIL) 200 MG tablet Take 200 mg by mouth as directed. Take 1 tablet one day and 2 the next day.    [provider]  metoprolol succinate (TOPROL-XL) 100 MG 24 hr tablet Take 100 mg by mouth daily. 06/11/18   [provider]  metoprolol tartrate (LOPRESSOR) 50 MG tablet Take 1 tablet (50 mg total) by mouth 2 (two) times daily. 06/10/18   Imogene Burn, PA-C  Multiple Vitamin (MULTIVITAMIN) tablet Take 1 tablet by mouth daily.    [provider]  mycophenolate (CELLCEPT) 500 MG tablet Take 1,500 mg by mouth 2 (two) times daily. For lupus    [provider]  nitroGLYCERIN (NITROSTAT) 0.4 MG SL tablet Place 1 tablet (0.4 mg total) under the tongue every 5 (five) minutes as needed for chest pain. 06/10/18   Imogene Burn, PA-C  pantoprazole (PROTONIX) 20 MG tablet Take 1 tablet (20 mg total) by mouth daily. 06/10/18   Imogene Burn, PA-C  spironolactone (ALDACTONE) 25 MG tablet Take 1 tablet (25 mg total) by mouth every other day. 06/10/18   Imogene Burn, PA-C  ticagrelor (BRILINTA) 90 MG TABS tablet Take 1 tablet (90 mg total) by mouth 2 (two) times daily. 06/10/18   Imogene Burn, PA-C    Current Facility-Administered Medications  Medication Dose Route Frequency Provider Last Rate Last Dose  . acetaminophen (TYLENOL) tablet 650 mg  650 mg Oral Q6H PRN Lenore Cordia, MD   650 mg at 06/14/18 0042   Or  . acetaminophen (TYLENOL) suppository 650 mg  650 mg Rectal Q6H PRN Lenore Cordia, MD      . aspirin EC tablet 81 mg  81 mg Oral Daily Lenore Cordia, MD   81 mg at 06/14/18 0913  . atorvastatin (LIPITOR) tablet 80 mg  80 mg Oral q1800 Zada Finders R, MD      . Chlorhexidine Gluconate Cloth 2 % PADS 6 each  6 each Topical  Q0600 Lenore Cordia, MD   6 each at 06/14/18 [phone]  . heparin ADULT infusion 100 units/mL (25000 units/230m sodium chloride 0.45%)  800 Units/hr Intravenous Continuous BLaren Everts RPH 8 mL/hr at 06/14/18 0431 800 Units/hr at 06/14/18 0431  . hydroxychloroquine (PLAQUENIL) tablet 200 mg  200 mg Oral QLoyal BubaR, MD   200 mg at 06/14/18 0913  . [START ON 06/15/2018] hydroxychloroquine (PLAQUENIL) tablet 400 mg  400 mg Oral QODAY Patel, Vishal R, MD      . mycophenolate (CELLCEPT) capsule 1,500 mg  1,500 mg Oral BID PZada FindersR, MD   1,500 mg at 06/14/18 0913  . ondansetron (ZOFRAN) tablet 4 mg  4 mg Oral Q6H PRN PLenore Cordia MD       Or  . ondansetron (ZOFRAN) injection 4 mg  4 mg Intravenous Q6H PRN PZada Finders  R, MD   4 mg at 06/13/18 2140  . piperacillin-tazobactam (ZOSYN) IVPB 3.375 g  3.375 g Intravenous Q8H Patel, Vishal R, MD 12.5 mL/hr at 06/14/18 0910 3.375 g at 06/14/18 0910  . sodium chloride flush (NS) 0.9 % injection 3 mL  3 mL Intravenous Q12H Patel, Roxanne Mins R, MD      . ticagrelor (BRILINTA) tablet 90 mg  90 mg Oral BID Lenore Cordia, MD   90 mg at 06/14/18 0914    Allergies as of 06/13/2018 - Review Complete 06/13/2018  Allergen Reaction Noted  . Tramadol Nausea Only 04/09/2011    Family History  Problem Relation Age of Onset  . Diabetes Sister   . Hypertension Mother   . Colon cancer Neg Hx   . Stomach cancer Neg Hx     Social History   Socioeconomic History  . Marital status: Single    Spouse name: Not on file  . Number of children: Not on file  . Years of education: Not on file  . Highest education level: Not on file  Occupational History  . Occupation: permanant disability    Employer: [employer]  Social Needs  . Financial resource strain: Not on file  . Food insecurity:    Worry: Not on file    Inability: Not on file  . Transportation needs:    Medical: Not on file    Non-medical: Not on file  Tobacco Use  . Smoking status:  Former Smoker    Packs/day: 0.75    Years: 40.00    Pack years: 30.00    Types: Cigarettes  . Smokeless tobacco: Never Used  . Tobacco comment: 1 cig a day  Substance and Sexual Activity  . Alcohol use: Yes    Comment: occassional; beer   . Drug use: No  . Sexual activity: Not Currently    Partners: Male  Lifestyle  . Physical activity:    Days per week: Not on file    Minutes per session: Not on file  . Stress: Not on file  Relationships  . Social connections:    Talks on phone: Not on file    Gets together: Not on file    Attends religious service: Not on file    Active member of club or organization: Not on file    Attends meetings of clubs or organizations: Not on file    Relationship status: Not on file  . Intimate partner violence:    Fear of current or ex partner: Not on file    Emotionally abused: Not on file    Physically abused: Not on file    Forced sexual activity: Not on file  Other Topics Concern  . Not on file  Social History Narrative   Exercise--- walking    Review of Systems: See HPI, all other systems reviewed and are negative   Physical Exam: Vital signs in last 24 hours: Temp:  [97.7 F (36.5 C)-103.3 F (39.6 C)] 97.7 F (36.5 C) (05/17 0451) Pulse Rate:  [104-133] 119 (05/16 2248) Resp:  [18-32] 21 (05/17 0451) BP: (72-145)/(53-93) 96/60 (05/17 0451) SpO2:  [97 %-100 %] 99 % (05/16 2248) Weight:  [65.8 kg-68.7 kg] 68.7 kg (05/16 2248) Last BM Date: 06/13/18 General:  Alert in NAD.  Head:  Normocephalic and atraumatic. Eyes:  Sclera clear, no icterus.Conjunctiva pink. Ears:  Normal auditory acuity. Nose:  No deformity, discharge, or lesions. Mouth:  No deformity or lesions.   Neck:  Supple. Lungs:  Few  coarse exp wheezes, diminished in the bases. Heart:  Tachycardic,  no murmurs, clicks, rubs,  or gallops. Abdomen:  Soft, tenderness to the RUQ and epigastric area without rebound or guarding, + BS x 4 quads. Rectal:  Deferred  Msk:   Symmetrical without gross deformities. . Pulses:  Normal pulses noted. Extremities:  Without clubbing or edema. Neurologic:  Alert and  oriented x4;  grossly normal neurologically. Skin:  Intact without significant lesions or rashes.. Psych:  Alert and cooperative. Normal mood and affect.  Intake/Output from previous day: 05/16 0701 - 05/17 0700 In: 2108 [I.V.:625; IV Piggyback:1483] Out: 2 [Urine:2] Intake/Output this shift: No intake/output data recorded.  Lab Results: Recent Labs    06/11/18 1022 06/13/18 1319 06/14/18 0015  WBC 8.5 10.4 12.0*  HGB 12.2 11.2* 10.0*  HCT 36.8 35.2* 31.7*  PLT 430 333 280   BMET Recent Labs    06/11/18 1022 06/13/18 1319 06/14/18 0015  NA 142 135 143  K 4.4 2.6* 3.0*  CL 101 102 113*  CO2 20 17* 21*  GLUCOSE 90 138* 104*  BUN 8 5* <5*  CREATININE 0.61 0.93 0.72  CALCIUM 9.5 8.5* 7.8*   LFT Recent Labs    06/14/18 0015  PROT 6.0*  ALBUMIN 2.2*  AST 28  ALT 23  ALKPHOS 77  BILITOT 0.9    Studies/Results: Ct Abdomen Pelvis W Contrast  Result Date: 06/13/2018 CLINICAL DATA:  Acute onset of generalized abdominal pain, nausea and vomiting, productive cough, shortness of breath and fever up to 105 degrees Fahrenheit. EXAM: CT ABDOMEN AND PELVIS WITH CONTRAST TECHNIQUE: Multidetector CT imaging of the abdomen and pelvis was performed using the standard protocol following bolus administration of intravenous contrast. CONTRAST:  119m OMNIPAQUE IOHEXOL 300 MG/ML IV. COMPARISON:  12/13/2015. FINDINGS: Lower chest: Linear atelectasis involving the LEFT LOWER LOBE. Visualized lung bases otherwise clear. Heart size upper normal to slightly enlarged. RIGHT coronary artery stent. Hepatobiliary: Liver normal in size and appearance. Gallbladder normal in appearance without calcified gallstones. No biliary ductal dilation. Pancreas: Normal in appearance without evidence of mass, ductal dilation, or inflammation. Spleen: Normal in size and  appearance. Adrenals/Urinary Tract: Normal appearing adrenal glands. Large benign simple cyst arising from the LOWER pole of the RIGHT kidney measuring maximally 9.5 cm, increased in size since 2017 where it measured approximately 8 cm. No solid mass involving either kidney. No hydronephrosis. No urinary tract calculi. Normal appearing urinary bladder. Stomach/Bowel: Stomach normal in appearance for the degree of distention. Normal-appearing small bowel. Entire colon decompressed, containing liquid stool. Opaque ingested material within the cecum and ascending colon. Mild edema in the fat adjacent to the cecum and ascending colon. Sigmoid colon diverticulosis without evidence of acute diverticulitis. Normal-appearing appendix low in the RIGHT side of the pelvis. Vascular/Lymphatic: Moderate aortoiliac atherosclerosis without evidence of aneurysm. No pathologic lymphadenopathy. Reproductive: Degenerating, partially calcified fibroid arising from the uterine fundus measuring approximately 4 cm. No adnexal masses. Other: None. 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 Facet degenerative changes at L4-5 and L5-S1. No acute findings. IMPRESSION: 1. Mild colitis involving the cecum and ascending colon. 2. Sigmoid colon diverticulosis without evidence of acute diverticulitis. 3. Approximate 4 cm degenerating, partially calcified uterine fundal fibroid. 4. Large approximate 9.5 cm benign simple cyst arising from the LOWER pole the RIGHT kidney. Cysts that are this large can occasionally be symptomatic-does the patient have RIGHT flank pain? Aortic Atherosclerosis (ICD10-170.0) Electronically Signed   By: TEvangeline Dakin  M.D.   On: 06/13/2018 16:33   Dg Chest Port 1 View  Result Date: 06/13/2018 CLINICAL DATA:  Acute onset of shortness of breath. EXAM: PORTABLE CHEST 1 VIEW COMPARISON:  CTA chest 06/03/2018 and earlier. Chest x-rays 06/01/2018 and earlier.  FINDINGS: Cardiac silhouette mildly enlarged for AP portable technique, unchanged. Lungs clear. Bronchovascular markings normal. Pulmonary vascularity normal. No visible pleural effusions. No pneumothorax. IMPRESSION: Stable mild cardiomegaly. No acute cardiopulmonary disease. Electronically Signed   By: Evangeline Dakin M.D.   On: 06/13/2018 21:30    IMPRESSION/PLAN:   1. Diarrhea. Most likely viral gastroenteritis. A/P CT shows mild colitis to the cecum and ascending colon.  -GI panel and C. Diff PCR if not already done -diagnostic colonoscopy deferred for now in setting of recent MI requiring anticoagulation -IVF per hospitalist -NPO for now -Zofran 48m po or  IV Q 4hrs PRN  2. CAD, S/P  STEMI MI with PCI to RCA 05/30/2018, Heparin IV dc'd, Brilinta restarted  -Cardiology to follow   3. Large right kidney cyst measures 9.5cm  4. Hx of colon polyps   CNoralyn Pick 06/14/2018, 10:11 AM

## 2018-06-14 NOTE — Progress Notes (Signed)
ANTICOAGULATION CONSULT NOTE - Initial Consult  Pharmacy Consult for heparin Indication: chest pain/ACS  Allergies  Allergen Reactions  . Tramadol Nausea Only    Feels weirded out    Patient Measurements: Height: 5\' 8"  (172.7 cm) Weight: 151 lb 7.3 oz (68.7 kg) IBW/kg (Calculated) : 63.9  Vital Signs: Temp: 101.6 F (38.7 C) (05/16 2248) Temp Source: Oral (05/16 2248) BP: 101/88 (05/16 2248) Pulse Rate: 119 (05/16 2248)  Labs: Recent Labs    06/11/18 1022 06/13/18 1319 06/14/18 0015  HGB 12.2 11.2* 10.0*  HCT 36.8 35.2* 31.7*  PLT 430 333 280  CREATININE 0.61 0.93 0.72  TROPONINI  --   --  0.22*    Estimated Creatinine Clearance: 79.2 mL/min (by C-G formula based on SCr of 0.72 mg/dL).   Medical History: Past Medical History:  Diagnosis Date  . Altered mental status   . Backache, unspecified   . Gastric ulcer, unspecified as acute or chronic, without mention of hemorrhage, perforation, or obstruction   . Helicobacter pylori (H. pylori)   . Hyperlipidemia   . Lupus ([place])   . STEMI (ST elevation myocardial infarction) ([place])    PCI to p/d RCA  . Tobacco abuse   . Tobacco use disorder 11/22/2011  . Unspecified essential hypertension     Medications:  Medications Prior to Admission  Medication Sig Dispense Refill Last Dose  . acetaminophen (TYLENOL) 500 MG tablet Take 500 mg by mouth every 6 (six) hours as needed for moderate pain or headache.   Taking  . aspirin EC 81 MG tablet Take 1 tablet (81 mg total) by mouth daily.   Taking  . atorvastatin (LIPITOR) 80 MG tablet Take 1 tablet (80 mg total) by mouth daily at 6 PM. 90 tablet 3 Taking  . cyclobenzaprine (FLEXERIL) 10 MG tablet TAKE 1 TABLET(10 MG) BY MOUTH THREE TIMES DAILY AS NEEDED (Patient taking differently: Take 10 mg by mouth 3 (three) times daily as needed for muscle spasms. ) 30 tablet 0   . hydroxychloroquine (PLAQUENIL) 200 MG tablet Take 200 mg by mouth as directed. Take 1 tablet one day and 2  the next day.   Taking  . metoprolol succinate (TOPROL-XL) 100 MG 24 hr tablet Take 100 mg by mouth daily.   5/15?  Marland Kitchen metoprolol tartrate (LOPRESSOR) 50 MG tablet Take 1 tablet (50 mg total) by mouth 2 (two) times daily. 180 tablet 3 Taking  . Multiple Vitamin (MULTIVITAMIN) tablet Take 1 tablet by mouth daily.   Taking  . mycophenolate (CELLCEPT) 500 MG tablet Take 1,500 mg by mouth 2 (two) times daily. For lupus   Taking  . nitroGLYCERIN (NITROSTAT) 0.4 MG SL tablet Place 1 tablet (0.4 mg total) under the tongue every 5 (five) minutes as needed for chest pain. 25 tablet 2 Taking  . pantoprazole (PROTONIX) 20 MG tablet Take 1 tablet (20 mg total) by mouth daily. 90 tablet 3 5/15?  Marland Kitchen spironolactone (ALDACTONE) 25 MG tablet Take 1 tablet (25 mg total) by mouth every other day. 90 tablet 3 Taking  . ticagrelor (BRILINTA) 90 MG TABS tablet Take 1 tablet (90 mg total) by mouth 2 (two) times daily. 180 tablet 3 5/15?   Scheduled:  . aspirin EC  81 mg Oral Daily  . atorvastatin  80 mg Oral q1800  . heparin  5,000 Units Subcutaneous Q8H  . hydroxychloroquine  200 mg Oral QODAY  . [START ON 06/15/2018] hydroxychloroquine  400 mg Oral QODAY  . mycophenolate  1,500 mg  Oral BID  . sodium chloride flush  3 mL Intravenous Q12H  . ticagrelor  90 mg Oral BID   Infusions:  . 0.9 % NaCl with KCl 40 mEq / L 125 mL/hr (06/14/18 0044)  . piperacillin-tazobactam (ZOSYN)  IV 3.375 g (06/14/18 0159)    Assessment: 58yo female admitted 5/2 for STEMI, had DES placed x2 and discharged, now presents w/ CP though not comparable to STEMI pain, troponin mildly elevated (0.22, peaked at >65 during STEMI admission), to begin heparin.  Goal of Therapy:  Heparin level 0.3-0.7 units/ml Monitor platelets by anticoagulation protocol: Yes   Plan:  Rec'd SQ heparin ~2h ago; will give small heparin bolus of 1000 units and begin heparin gtt at 800 units/hr and monitor heparin levels and CBC.  Wynona Neat, PharmD, BCPS   06/14/2018,2:30 AM

## 2018-06-14 NOTE — Progress Notes (Signed)
CRITICAL VALUE ALERT  Critical Value:  Troponin  0.22  Date & Time Notied:  06/14/2018 at 0135  Provider Notified: K.schorr  Orders Received/Actions taken: will notify cardiology as per K. schorr.

## 2018-06-14 NOTE — Progress Notes (Signed)
CRITICAL VALUE ALERT  Critical Value:   Troponin 0.22  Date & Time Notied:  01:33  Provider Notified: Schorr, NP  Orders Received/Actions taken: Awaiting orders; primary nurse notified.

## 2018-06-14 NOTE — Progress Notes (Signed)
Patient received from ED via bed. Patient is alert and oriented x4. Vital signs are temp 101.6, pulse 119, BP 101/88 , resp 27 and spo2 99% in nasal canula 2 lit. Skin assessment done with another nurse. Iv in place and running fluid. Patient denies for pain. Given schedule medicine. Patient given instructions about call bell and phone. Bed in low position and call bell in reach. Patient's troponin level was 0.22. notified on call cardiology Fudim Marat and given aspirin 325 mg oral and started heparin drip  at 8 ml per hour as per order. Will continue monitor the patient

## 2018-06-14 NOTE — Progress Notes (Addendum)
PROGRESS NOTE        PATIENT DETAILS Name: Samantha [name] Age: 58 y.o. Sex: female Date of Birth: [birth year] Admit Date: 06/13/2018 Admitting Physician Charlsie Quest, MD WUJ:[id number] Irish Elders, DO  Brief Narrative: Patient is a 58 y.o. female with history of STEMI s/p PCI with DES on 5/2, lupus on Plaquenil and CellCept, HTN, dyslipidemia presented with nausea, vomiting, diarrhea and fever, found to have colitis in the ascending colon and subsequently found to have gram-negative bacteremia.  See below for further details  Subjective: Feels better than yesterday-appears chronically sick looking.  No vomiting or diarrhea since coming up to the floor.  Abdominal pain is better as well  Assessment/Plan: Sepsis secondary to to colitis with gram-negative bacteremia: Sepsis physiology is improving-continue empiric Zosyn.  GI pathogen panel pending.  Await final culture results-will start clear liquids and advance slowly if tolerates  Recent inferior wall STEMI s/p PCI with DES to RCA on 5/2: No chest pain or shortness of breath-troponins only minimally elevated as compared to before.  Continue aspirin/Brilinta/statin/beta-blocker (at a lower dose due to soft BP).  Spoke with cardiology-Dr. Eden Emms will review chart and see if patient needs to be on IV heparin.  Hypokalemia: Secondary to GI loss-we will replete and recheck  Hypertension: Blood pressure now improved-starting low-dose metoprolol.  Dyslipidemia: Continue statin  Lupus: No evidence of flare-continue Plaquenil and CellCept  COVID 19 screen: negative   DVT Prophylaxis: Full dose anticoagulation with Heparin  Code Status: Full code   Family Communication: None at bedside  Disposition Plan: Remain inpatient  Antimicrobial agents: Anti-infectives (From admission, onward)   Start     Dose/Rate Route Frequency Ordered Stop   06/15/18 1000  hydroxychloroquine (PLAQUENIL) tablet 400 mg      Note to Pharmacy:  Take 1 tablet one day and 2 the next day.     400 mg Oral Every other day 06/13/18 2346     06/14/18 1000  hydroxychloroquine (PLAQUENIL) tablet 200 mg    Note to Pharmacy:  Take 1 tablet one day and 2 the next day.     200 mg Oral Every other day 06/13/18 2339     06/14/18 0000  piperacillin-tazobactam (ZOSYN) IVPB 3.375 g  Status:  Discontinued     3.375 g 100 mL/hr over 30 Minutes Intravenous Every 6 hours 06/13/18 2331 06/13/18 2340   06/14/18 0000  piperacillin-tazobactam (ZOSYN) IVPB 3.375 g     3.375 g 12.5 mL/hr over 240 Minutes Intravenous Every 8 hours 06/13/18 2340     06/13/18 1630  piperacillin-tazobactam (ZOSYN) IVPB 3.375 g     3.375 g 100 mL/hr over 30 Minutes Intravenous  Once 06/13/18 1615 06/13/18 1713      Procedures: None  CONSULTS:  cardiology and GI  Time spent: 25- minutes-Greater than 50% of this time was spent in counseling, explanation of diagnosis, planning of further management, and coordination of care.  MEDICATIONS: Scheduled Meds: . aspirin EC  81 mg Oral Daily  . atorvastatin  80 mg Oral q1800  . Chlorhexidine Gluconate Cloth  6 each Topical Q0600  . hydroxychloroquine  200 mg Oral QODAY  . [START ON 06/15/2018] hydroxychloroquine  400 mg Oral QODAY  . mycophenolate  1,500 mg Oral BID  . sodium chloride flush  3 mL Intravenous Q12H  . ticagrelor  90 mg Oral BID  Continuous Infusions: . heparin 800 Units/hr (06/14/18 0431)  . piperacillin-tazobactam (ZOSYN)  IV 3.375 g (06/14/18 0910)   PRN Meds:.acetaminophen **OR** acetaminophen, ondansetron **OR** ondansetron (ZOFRAN) IV   PHYSICAL EXAM: Vital signs: Vitals:   06/14/18 0305 06/14/18 0320 06/14/18 0335 06/14/18 0451  BP: 92/61 (!) 84/60 95/63 96/60   Pulse:      Resp: (!) 22 (!) 21 19 (!) 21  Temp: 98.6 F (37 C)   97.7 F (36.5 C)  TempSrc: Oral   Oral  SpO2:      Weight:      Height:       Filed Weights   06/13/18 1837 06/13/18 2248  Weight: 65.8 kg  68.7 kg   Body mass index is 23.03 kg/m.   General appearance :Awake, alert, not in any distress.  HEENT: Atraumatic and Normocephalic Neck: supple, no JVD. No cervical lymphadenopathy. No thyromegaly Resp:Good air entry bilaterally, no added sounds  CVS: S1 S2 regular GI: Bowel sounds present, very mild but diffuse tenderness in the upper abdomen, no peritoneal signs  Extremities: B/L Lower Ext shows no edema, both legs are warm to touch Neurology:  speech clear,Non focal, sensation is grossly intact. Musculoskeletal:No digital cyanosis Skin:No Rash, warm and dry Wounds:N/A  I have personally reviewed following labs and imaging studies  LABORATORY DATA: CBC: Recent Labs  Lab 06/11/18 1022 06/13/18 1319 06/14/18 0015  WBC 8.5 10.4 12.0*  NEUTROABS  --  9.0*  --   HGB 12.2 11.2* 10.0*  HCT 36.8 35.2* 31.7*  MCV 84 87.1 86.8  PLT 430 333 280    Basic Metabolic Panel: Recent Labs  Lab 06/11/18 1022 06/13/18 1319 06/14/18 0015  NA 142 135 143  K 4.4 2.6* 3.0*  CL 101 102 113*  CO2 20 17* 21*  GLUCOSE 90 138* 104*  BUN 8 5* <5*  CREATININE 0.61 0.93 0.72  CALCIUM 9.5 8.5* 7.8*  MG  --  1.5*  --     GFR: Estimated Creatinine Clearance: 79.2 mL/min (by C-G formula based on SCr of 0.72 mg/dL).  Liver Function Tests: Recent Labs  Lab 06/11/18 1022 06/13/18 1319 06/14/18 0015  AST 38 31 28  ALT 30 25 23   ALKPHOS 98 86 77  BILITOT 0.3 1.0 0.9  PROT 7.6 6.7 6.0*  ALBUMIN 4.0 2.5* 2.2*   Recent Labs  Lab 06/14/18 0015  LIPASE 27   No results for input(s): AMMONIA in the last 168 hours.  Coagulation Profile: No results for input(s): INR, PROTIME in the last 168 hours.  Cardiac Enzymes: Recent Labs  Lab 06/14/18 0015  TROPONINI 0.22*    BNP (last 3 results) No results for input(s): PROBNP in the last 8760 hours.  HbA1C: No results for input(s): HGBA1C in the last 72 hours.  CBG: No results for input(s): GLUCAP in the last 168 hours.  Lipid  Profile: No results for input(s): CHOL, HDL, LDLCALC, TRIG, CHOLHDL, LDLDIRECT in the last 72 hours.  Thyroid Function Tests: No results for input(s): TSH, T4TOTAL, FREET4, T3FREE, THYROIDAB in the last 72 hours.  Anemia Panel: No results for input(s): VITAMINB12, FOLATE, FERRITIN, TIBC, IRON, RETICCTPCT in the last 72 hours.  Urine analysis:    Component Value Date/Time   COLORURINE YELLOW 06/13/2018 1612   APPEARANCEUR HAZY (A) 06/13/2018 1612   LABSPEC 1.009 06/13/2018 1612   PHURINE 5.0 06/13/2018 1612   GLUCOSEU NEGATIVE 06/13/2018 1612   HGBUR SMALL (A) 06/13/2018 1612   BILIRUBINUR NEGATIVE 06/13/2018 1612   BILIRUBINUR neg  12/25/2015 1415   KETONESUR 5 (A) 06/13/2018 1612   PROTEINUR NEGATIVE 06/13/2018 1612   UROBILINOGEN 0.2 12/25/2015 1415   UROBILINOGEN 1.0 06/01/2014 0408   NITRITE NEGATIVE 06/13/2018 1612   LEUKOCYTESUR SMALL (A) 06/13/2018 1612    Sepsis Labs: Lactic Acid, Venous    Component Value Date/Time   LATICACIDVEN 0.9 06/13/2018 1841    MICROBIOLOGY: Recent Results (from the past 240 hour(s))  Blood Culture (routine x 2)     Status: None (Preliminary result)   Collection Time: 06/13/18  1:19 PM  Result Value Ref Range Status   Specimen Description BLOOD LEFT ANTECUBITAL  Final   Special Requests   Final    BOTTLES DRAWN AEROBIC AND ANAEROBIC Blood Culture results may not be optimal due to an excessive volume of blood received in culture bottles   Culture  Setup Time   Final    ANAEROBIC BOTTLE ONLY GRAM NEGATIVE RODS Organism ID to follow CRITICAL RESULT CALLED TO, READ BACK BY AND VERIFIED WITH: JOHN ORIET PHARMACIST @ 0600 ON 06/14/18 BY Sellin Z.  Performed at [facility], [address], [place], [place] [address]    Culture PENDING  Incomplete   Report Status PENDING  Incomplete  Blood Culture ID Panel (Reflexed)     Status: Abnormal   Collection Time: 06/13/18  1:19 PM  Result Value Ref Range Status   Enterococcus species  NOT DETECTED NOT DETECTED Final   Listeria monocytogenes NOT DETECTED NOT DETECTED Final   Staphylococcus species NOT DETECTED NOT DETECTED Final   Staphylococcus aureus (BCID) NOT DETECTED NOT DETECTED Final   Streptococcus species NOT DETECTED NOT DETECTED Final   Streptococcus agalactiae NOT DETECTED NOT DETECTED Final   Streptococcus pneumoniae NOT DETECTED NOT DETECTED Final   Streptococcus pyogenes NOT DETECTED NOT DETECTED Final   Acinetobacter baumannii NOT DETECTED NOT DETECTED Final   Enterobacteriaceae species DETECTED (A) NOT DETECTED Final    Comment: Enterobacteriaceae represent a large family of gram-negative bacteria, not a single organism. CRITICAL RESULT CALLED TO, READ BACK BY AND VERIFIED WITH: JOHN ORIET PHARMACIST @ 0600 ON 06/14/18 BY Fesler Z.     Enterobacter cloacae complex NOT DETECTED NOT DETECTED Final   Escherichia coli DETECTED (A) NOT DETECTED Final    Comment: CRITICAL RESULT CALLED TO, READ BACK BY AND VERIFIED WITH: JOHN ORIET PHARMACIST @ 0600 ON 06/14/18 BY Gotay Z.     Klebsiella oxytoca NOT DETECTED NOT DETECTED Final   Klebsiella pneumoniae NOT DETECTED NOT DETECTED Final   Proteus species NOT DETECTED NOT DETECTED Final   Serratia marcescens NOT DETECTED NOT DETECTED Final   Carbapenem resistance NOT DETECTED NOT DETECTED Final   Haemophilus influenzae NOT DETECTED NOT DETECTED Final   Neisseria meningitidis NOT DETECTED NOT DETECTED Final   Pseudomonas aeruginosa NOT DETECTED NOT DETECTED Final   Candida albicans NOT DETECTED NOT DETECTED Final   Candida glabrata NOT DETECTED NOT DETECTED Final   Candida krusei NOT DETECTED NOT DETECTED Final   Candida parapsilosis NOT DETECTED NOT DETECTED Final   Candida tropicalis NOT DETECTED NOT DETECTED Final    Comment: Performed at [facility], [address], [place], [place] [address]  SARS Coronavirus 2 (CEPHEID- Performed in [facility] hospital lab), Hosp Order     Status: None    Collection Time: 06/13/18  1:50 PM  Result Value Ref Range Status   SARS Coronavirus 2 NEGATIVE NEGATIVE Final    Comment: (NOTE) If result is NEGATIVE SARS-CoV-2 target nucleic  acids are NOT DETECTED. The SARS-CoV-2 RNA is generally detectable in upper and lower  respiratory specimens during the acute phase of infection. The lowest  concentration of SARS-CoV-2 viral copies this assay can detect is 250  copies / mL. A negative result does not preclude SARS-CoV-2 infection  and should not be used as the sole basis for treatment or other  patient management decisions.  A negative result may occur with  improper specimen collection / handling, submission of specimen other  than nasopharyngeal swab, presence of viral mutation(s) within the  areas targeted by this assay, and inadequate number of viral copies  (<250 copies / mL). A negative result must be combined with clinical  observations, patient history, and epidemiological information. If result is POSITIVE SARS-CoV-2 target nucleic acids are DETECTED. The SARS-CoV-2 RNA is generally detectable in upper and lower  respiratory specimens dur ing the acute phase of infection.  Positive  results are indicative of active infection with SARS-CoV-2.  Clinical  correlation with patient history and other diagnostic information is  necessary to determine patient infection status.  Positive results do  not rule out bacterial infection or co-infection with other viruses. If result is PRESUMPTIVE POSTIVE SARS-CoV-2 nucleic acids MAY BE PRESENT.   A presumptive positive result was obtained on the submitted specimen  and confirmed on repeat testing.  While 2019 novel coronavirus  (SARS-CoV-2) nucleic acids may be present in the submitted sample  additional confirmatory testing may be necessary for epidemiological  and / or clinical management purposes  to differentiate between  SARS-CoV-2 and other Sarbecovirus currently known to infect humans.    If clinically indicated additional testing with an alternate test  methodology [phone]) is advised. The SARS-CoV-2 RNA is generally  detectable in upper and lower respiratory sp ecimens during the acute  phase of infection. The expected result is Negative. Fact Sheet for Patients:  BoilerBrush.com.cy Fact Sheet for Healthcare Providers: https://pope.com/ This test is not yet approved or cleared by the [place] FDA and has been authorized for detection and/or diagnosis of SARS-CoV-2 by FDA under an Emergency Use Authorization (EUA).  This EUA will remain in effect (meaning this test can be used) for the duration of the COVID-19 declaration under Section 564(b)(1) of the Act, 21 U.S.C. section 360bbb-3(b)(1), unless the authorization is terminated or revoked sooner. Performed at [facility], [address] [place], [place] [address]   MRSA PCR Screening     Status: None   Collection Time: 06/14/18  3:39 AM  Result Value Ref Range Status   MRSA by PCR NEGATIVE NEGATIVE Final    Comment:        The GeneXpert MRSA Assay (FDA approved for NASAL specimens only), is one component of a comprehensive MRSA colonization surveillance program. It is not intended to diagnose MRSA infection nor to guide or monitor treatment for MRSA infections. Performed at [facility], [address], [place], [place] [address]     RADIOLOGY STUDIES/RESULTS: Ct Abdomen Pelvis W Contrast  Result Date: 06/13/2018 CLINICAL DATA:  Acute onset of generalized abdominal pain, nausea and vomiting, productive cough, shortness of breath and fever up to 105 degrees Fahrenheit. EXAM: CT ABDOMEN AND PELVIS WITH CONTRAST TECHNIQUE: Multidetector CT imaging of the abdomen and pelvis was performed using the standard protocol following bolus administration of intravenous contrast. CONTRAST:  OMNIPAQUE IOHEXOL 300 MG/ML IV. COMPARISON:  12/13/2015.  FINDINGS: Lower chest: Linear atelectasis involving the LEFT LOWER LOBE. Visualized lung bases otherwise  clear. Heart size upper normal to slightly enlarged. RIGHT coronary artery stent. Hepatobiliary: Liver normal in size and appearance. Gallbladder normal in appearance without calcified gallstones. No biliary ductal dilation. Pancreas: Normal in appearance without evidence of mass, ductal dilation, or inflammation. Spleen: Normal in size and appearance. Adrenals/Urinary Tract: Normal appearing adrenal glands. Large benign simple cyst arising from the LOWER pole of the RIGHT kidney measuring maximally 9.5 cm, increased in size since 2017 where it measured approximately 8 cm. No solid mass involving either kidney. No hydronephrosis. No urinary tract calculi. Normal appearing urinary bladder. Stomach/Bowel: Stomach normal in appearance for the degree of distention. Normal-appearing small bowel. Entire colon decompressed, containing liquid stool. Opaque ingested material within the cecum and ascending colon. Mild edema in the fat adjacent to the cecum and ascending colon. Sigmoid colon diverticulosis without evidence of acute diverticulitis. Normal-appearing appendix low in the RIGHT side of the pelvis. Vascular/Lymphatic: Moderate aortoiliac atherosclerosis without evidence of aneurysm. No pathologic lymphadenopathy. Reproductive: Degenerating, partially calcified fibroid arising from the uterine fundus measuring approximately 4 cm. No adnexal masses. Other: None. 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 Facet degenerative changes at L4-5 and L5-S1. No acute findings. IMPRESSION: 1. Mild colitis involving the cecum and ascending colon. 2. Sigmoid colon diverticulosis without evidence of acute diverticulitis. 3. Approximate 4 cm degenerating, partially calcified uterine fundal fibroid. 4. Large approximate 9.5 cm benign simple cyst arising from the LOWER  pole the RIGHT kidney. Cysts that are this large can occasionally be symptomatic-does the patient have RIGHT flank pain? Aortic Atherosclerosis (ICD10-170.0) Electronically Signed   By: Hulan Saas M.D.   On: 06/13/2018 16:33   Dg Chest Port 1 View  Result Date: 06/13/2018 CLINICAL DATA:  Acute onset of shortness of breath. EXAM: PORTABLE CHEST 1 VIEW COMPARISON:  CTA chest 06/03/2018 and earlier. Chest x-rays 06/01/2018 and earlier. FINDINGS: Cardiac silhouette mildly enlarged for AP portable technique, unchanged. Lungs clear. Bronchovascular markings normal. Pulmonary vascularity normal. No visible pleural effusions. No pneumothorax. IMPRESSION: Stable mild cardiomegaly. No acute cardiopulmonary disease. Electronically Signed   By: Hulan Saas M.D.   On: 06/13/2018 21:30   Dg Chest Port 1 View  Result Date: 06/01/2018 CLINICAL DATA:  Acute respiratory failure with hypoxia. EXAM: PORTABLE CHEST 1 VIEW COMPARISON:  Chest x-ray dated May 30, 2018. FINDINGS: Unchanged endotracheal and enteric tubes. Unchanged right internal jugular Swan-Ganz catheter with the tip in the region of the distal right interlobar pulmonary artery. The heart size and mediastinal contours are within normal limits. Normal pulmonary vascularity. Improved aeration at the lung bases. No focal consolidation, pleural effusion, or pneumothorax. No acute osseous abnormality. IMPRESSION: 1. Improved aeration at the lung bases. 2. Unchanged right internal jugular Swan-Ganz catheter with tip in the region of the distal right interlobar pulmonary artery. Consider retracting at least 4 cm. Electronically Signed   By: Obie Dredge M.D.   On: 06/01/2018 07:44   Portable Chest X-ray  Result Date: 05/30/2018 CLINICAL DATA:  Intubation, history hypertension, lupus, smoker EXAM: PORTABLE CHEST 1 VIEW COMPARISON:  Portable exam 2030 hours compared to 05/30/2018 at 1015 hours FINDINGS: Tip of endotracheal tube projects 2.8 cm above carina.  RIGHT jugular Swan-Ganz catheter with tip projecting over RIGHT lower lobe pulmonary artery. Nasogastric tube extends into stomach. Normal heart size, mediastinal contours, and pulmonary vascularity. Atherosclerotic calcification aorta. Bibasilar atelectasis greater on LEFT. No gross pleural effusion or pneumothorax. IMPRESSION: Line and tube positions as above. Bibasilar atelectasis greater on  LEFT. Electronically Signed   By: Ulyses Southward M.D.   On: 05/30/2018 20:40   Dg Chest Portable 1 View  Result Date: 05/30/2018 CLINICAL DATA:  Chest pain EXAM: PORTABLE CHEST 1 VIEW COMPARISON:  01/12/2018 chest radiograph. FINDINGS: Stable cardiomediastinal silhouette with top-normal heart size. No pneumothorax. No pleural effusion. Lungs appear clear, with no acute consolidative airspace disease and no pulmonary edema. IMPRESSION: No active disease. Electronically Signed   By: Delbert Phenix M.D.   On: 05/30/2018 10:48   Dg Abd Portable 1v  Result Date: 05/30/2018 CLINICAL DATA:  OG tube placement EXAM: PORTABLE ABDOMEN - 1 VIEW COMPARISON:  None. FINDINGS: OG tube tip is in the mid to distal stomach. Nonobstructive bowel gas pattern. IMPRESSION: OG tube tip in the mid to distal stomach. Electronically Signed   By: Charlett Nose M.D.   On: 05/30/2018 20:15   Ct Angio Chest Aorta W/cm &/or Wo/cm  Result Date: 06/03/2018 CLINICAL DATA:  Abnormal CT at admission. EXAM: CT ANGIOGRAPHY CHEST WITH CONTRAST TECHNIQUE: Multidetector CT imaging of the chest was performed using the standard protocol during bolus administration of intravenous contrast. Multiplanar CT image reconstructions and MIPs were obtained to evaluate the vascular anatomy. CONTRAST:  80mL OMNIPAQUE IOHEXOL 350 MG/ML SOLN COMPARISON:  05/30/2018 FINDINGS: Cardiovascular: Again noted is abnormal soft tissue surrounding the distal ascending thoracic aorta and proximal aortic arch. This measures fluid density on today's study, question pericardial effusion in a  recess. Small amount of pericardial fluid noted posterior to the left heart. There is cardiomegaly. Densely calcified right coronary artery. Scattered calcifications in the left anterior descending coronary artery. Aortic atherosclerosis. No evidence of aortic aneurysm. Scattered aortic calcifications. Mediastinum/Nodes: Soft tissue/fluid density adjacent to the distal ascending thoracic aorta and proximal aortic arch. This also extends adjacent to the main pulmonary artery raising the possibility of fluid within a pericardial recess. No adenopathy. Lungs/Pleura: Bilateral lower lobe airspace opacities posteriorly, likely atelectasis. Scattered patchy ground-glass opacities within the lungs, new since prior study could reflect areas of atelectasis or early edema or alveolitis. Trace bilateral effusions. Upper Abdomen: Imaging into the upper abdomen shows no acute findings. Musculoskeletal: Chest wall soft tissues are unremarkable. No acute bony abnormality. Review of the MIP images confirms the above findings. IMPRESSION: Soft tissue again noted surrounding the distal ascending thoracic aorta and proximal aortic arch. This soft tissue now extends adjacent to the main pulmonary artery and measures fluid density. This raises the possibility of fluid within a pericardial recess. There is a small amount of pericardial fluid posterior to the left heart border. Cardiomegaly. Coronary artery disease. Trace bilateral effusions.  Bibasilar atelectasis. Aortic Atherosclerosis (ICD10-I70.0). Electronically Signed   By: Charlett Nose M.D.   On: 06/03/2018 01:18   Ct Angio Chest/abd/pel For Dissection W And/or Wo Contrast  Result Date: 05/30/2018 CLINICAL DATA:  Severe chest pain radiating into the back with nausea and vomiting. Hypotension. EXAM: CT ANGIOGRAPHY CHEST, ABDOMEN AND PELVIS TECHNIQUE: Multidetector CT imaging through the chest, abdomen and pelvis was performed using the standard protocol during bolus  administration of intravenous contrast. Multiplanar reconstructed images and MIPs were obtained and reviewed to evaluate the vascular anatomy. CONTRAST:  OMNIPAQUE IOHEXOL 350 MG/ML SOLN COMPARISON:  Chest radiograph from earlier today. 03/06/2018 screening chest CT. 12/13/2015 CT abdomen/pelvis. FINDINGS: CTA CHEST FINDINGS Cardiovascular: Mild cardiomegaly. No significant pericardial effusion/thickening. Left anterior descending and right coronary atherosclerosis. Atherosclerotic nonaneurysmal thoracic aorta. There is new wall thickening in the ascending thoracic aorta compared to 03/06/2018 screening  chest CT study, which extends to the junction with the aortic arch and which measures complex fluid density, which may indicate acute intramural hematoma. No dissection flap within the thoracic aorta. No pseudoaneurysm. No penetrating atherosclerotic ulcer. Aortic arch branch vessels are patent. Normal caliber pulmonary arteries. No central pulmonary emboli. Mediastinum/Nodes: No discrete thyroid nodules. Unremarkable esophagus. No pathologically enlarged axillary, mediastinal or hilar lymph nodes. Lungs/Pleura: No pneumothorax. No pleural effusion. Mild centrilobular and paraseptal emphysema with mild diffuse bronchial wall thickening. Low-attenuation material obstructs the left lower lobe bronchus. Subsegmental scarring versus atelectasis in the left lower lobe. No acute consolidative airspace disease, lung masses or significant pulmonary nodules. Musculoskeletal: No aggressive appearing focal osseous lesions. Mild thoracic spondylosis. Review of the MIP images confirms the above findings. CTA ABDOMEN AND PELVIS FINDINGS VASCULAR Aorta: Normal caliber aorta without aneurysm, dissection, vasculitis or significant stenosis. Celiac: Patent without evidence of aneurysm, dissection, vasculitis or significant stenosis. SMA: Patent without evidence of aneurysm, dissection, vasculitis or significant stenosis. Renals:  Both renal arteries are patent without evidence of aneurysm, dissection, vasculitis, fibromuscular dysplasia or significant stenosis. IMA: Patent without evidence of aneurysm, dissection, vasculitis or significant stenosis. Inflow: Patent without evidence of aneurysm, dissection, vasculitis or significant stenosis. Veins: No obvious venous abnormality within the limitations of this arterial phase study. Review of the MIP images confirms the above findings. NON-VASCULAR Hepatobiliary: Normal liver with no liver mass. Normal gallbladder with no radiopaque cholelithiasis. No biliary ductal dilatation. Pancreas: Normal, with no mass or duct dilation. Spleen: Normal size. No mass. Adrenals/Urinary Tract: Normal adrenals. Simple 8.6 cm anterior lower right renal cyst. No hydronephrosis. Normal bladder. Stomach/Bowel: Normal non-distended stomach. Normal caliber small bowel with no small bowel wall thickening. Normal appendix. Normal large bowel with no diverticulosis, large bowel wall thickening or pericolonic fat stranding. Vascular/Lymphatic: Atherosclerotic nonaneurysmal abdominal aorta. No pathologically enlarged lymph nodes in the abdomen or pelvis. Reproductive: Mildly enlarged myomatous uterus with calcified uterine fibroids. No adnexal masses. Other: No pneumoperitoneum, ascites or focal fluid collection. Musculoskeletal: No aggressive appearing focal osseous lesions. Moderate lower lumbar spondylosis. Review of the MIP images confirms the above findings. IMPRESSION: 1. New wall thickening in the ascending thoracic aorta compared to 03/06/2018 screening chest CT study, measuring complex fluid density, favored to represent acute aortic syndrome due to type A acute intramural hematoma. No visible dissection flap. No aneurysm or pseudoaneurysm. 2. Mild cardiomegaly.  Two-vessel coronary atherosclerosis. 3. Mild centrilobular and paraseptal emphysema with mild diffuse bronchial wall thickening, suggesting COPD. 4.  Mildly enlarged calcified myomatous uterus. 5. Aortic Atherosclerosis (ICD10-I70.0) and Emphysema (ICD10-J43.9). Critical Value/emergent results were called by telephone at the time of interpretation on 05/30/2018 at 11:36 am to Dr. Raeford Razor and to Dr. Camillo Flaming in the cath lab, who verbally acknowledged these results. Electronically Signed   By: Delbert Phenix M.D.   On: 05/30/2018 11:42     LOS: 1 day   Jeoffrey Massed, MD  [facility]  If 7PM-7AM, please contact night-coverage  Please page via [email or web address]  Go to [email or web address] and use [facility] universal password to access. If you do not have the password, please contact the hospital operator.  Locate the [facility] provider you are looking for under [facility] and page to a number that you can be directly reached. If you still have difficulty reaching the provider, please page the [facility] (Director on Call) for the Hospitalists listed on amion for assistance.  06/14/2018, 11:06 AM

## 2018-06-15 ENCOUNTER — Encounter (HOSPITAL_COMMUNITY): Payer: Medicare Other

## 2018-06-15 LAB — CBC
HCT: 30.5 % — ABNORMAL LOW (ref 36.0–46.0)
Hemoglobin: 9.5 g/dL — ABNORMAL LOW (ref 12.0–15.0)
MCH: 27.5 pg (ref 26.0–34.0)
MCHC: 31.1 g/dL (ref 30.0–36.0)
MCV: 88.2 fL (ref 80.0–100.0)
Platelets: 248 10*3/uL (ref 150–400)
RBC: 3.46 MIL/uL — ABNORMAL LOW (ref 3.87–5.11)
RDW: 14.1 % (ref 11.5–15.5)
WBC: 8.9 10*3/uL (ref 4.0–10.5)
nRBC: 0 % (ref 0.0–0.2)

## 2018-06-15 LAB — BASIC METABOLIC PANEL
Anion gap: 14 (ref 5–15)
BUN: <5 mg/dL — ABNORMAL LOW (ref 6–20)
CO2: 18 mmol/L — ABNORMAL LOW (ref 22–32)
Calcium: 7.9 mg/dL — ABNORMAL LOW (ref 8.9–10.3)
Chloride: 103 mmol/L (ref 98–111)
Creatinine, Ser: 1.02 mg/dL — ABNORMAL HIGH (ref 0.44–1.00)
GFR calc Af Amer: >60 mL/min (ref >60)
GFR calc non Af Amer: >60 mL/min (ref >60)
Glucose, Bld: 81 mg/dL (ref 70–99)
Potassium: 3.4 mmol/L — ABNORMAL LOW (ref 3.5–5.1)
Sodium: 135 mmol/L (ref 135–145)

## 2018-06-15 LAB — TROPONIN I: Troponin I: 0.16 ng/mL (ref <0.03)

## 2018-06-15 LAB — MAGNESIUM: Magnesium: 2.1 mg/dL (ref 1.7–2.4)

## 2018-06-15 MED ORDER — DIPHENHYDRAMINE HCL 50 MG/ML IJ SOLN
25.0000 mg | Freq: Once | INTRAMUSCULAR | Status: AC
  Administered 2018-06-15: 25 mg via INTRAVENOUS
  Filled 2018-06-15: qty 1

## 2018-06-15 MED ORDER — METOCLOPRAMIDE HCL 5 MG/ML IJ SOLN
10.0000 mg | Freq: Once | INTRAMUSCULAR | Status: AC
  Administered 2018-06-15: 10 mg via INTRAVENOUS
  Filled 2018-06-15: qty 2

## 2018-06-15 MED ORDER — ENOXAPARIN SODIUM 40 MG/0.4ML ~~LOC~~ SOLN
40.0000 mg | SUBCUTANEOUS | Status: DC
  Administered 2018-06-15: 40 mg via SUBCUTANEOUS
  Filled 2018-06-15: qty 0.4

## 2018-06-15 MED ORDER — POTASSIUM CHLORIDE CRYS ER 20 MEQ PO TBCR
40.0000 meq | EXTENDED_RELEASE_TABLET | Freq: Two times a day (BID) | ORAL | Status: AC
  Administered 2018-06-15 (×2): 40 meq via ORAL
  Filled 2018-06-15 (×2): qty 2

## 2018-06-15 MED ORDER — KETOROLAC TROMETHAMINE 30 MG/ML IJ SOLN
30.0000 mg | Freq: Once | INTRAMUSCULAR | Status: AC
  Administered 2018-06-15: 30 mg via INTRAVENOUS
  Filled 2018-06-15: qty 1

## 2018-06-15 NOTE — Progress Notes (Signed)
PROGRESS NOTE    Samantha [name]  WUJ:[id number] DOB: February 06, 1960 DOA: 06/13/2018 PCP: [name], DO   Brief Narrative:  Per HPI: Patient is a 58 y.o. female with history of STEMI s/p PCI with DES on 5/2, lupus on Plaquenil and CellCept, HTN, dyslipidemia presented with nausea, vomiting, diarrhea and fever, found to have colitis in the ascending colon and subsequently found to have gram-negative bacteremia.    Assessment & Plan:   Principal Problem:   Colitis Active Problems:   Hypokalemia   Lupus (systemic lupus erythematosus) (HCC)   HTN (hypertension)   Hyperlipidemia LDL goal <70   CAD (coronary artery disease)   Cyst of right kidney   Sepsis secondary to to colitis with E. coli bacteremia: Sepsis physiology is improving-continue empiric Zosyn.  GI pathogen panel is negative.  Await final culture sensitivities and advance diet to full liquid today.  Anticipate discharge hopefully by a.m. if tolerating soft diet.  Appreciate GI evaluation and it appears she will require colonoscopic evaluation at some point likely in the outpatient setting.  Recent inferior wall STEMI s/p PCI with DES to RCA on 5/2: No chest pain or shortness of breath-troponins only minimally elevated as compared to before.  Continue aspirin/Brilinta/statin/beta-blocker (at a lower dose due to soft BP).    Cardiologist reviewed chart and patient does not require IV heparin.  Troponin downtrending with no chest pain noted.  Maintained on heparin subcu.  Hypokalemia-persistent: Secondary to GI loss-we will replete orally for 2 doses and recheck in a.m. along with magnesium.  Hypertension: Blood pressure now improved-starting low-dose metoprolol.  Dyslipidemia: Continue statin  Lupus: No evidence of flare-continue Plaquenil and CellCept  COVID 19 screen: negative   DVT prophylaxis: Heparin Code Status: Full Family Communication: None, lives alone Disposition Plan: Anticipate discharge by  5/19 pending clinical improvement and further sensitivities.   Consultants:   GI  Cardiology  Procedures:   None  Antimicrobials:  Anti-infectives (From admission, onward)   Start     Dose/Rate Route Frequency Ordered Stop   06/15/18 1000  hydroxychloroquine (PLAQUENIL) tablet 400 mg    Note to Pharmacy:  Take 1 tablet one day and 2 the next day.     400 mg Oral Every other day 06/13/18 2346     06/14/18 1000  hydroxychloroquine (PLAQUENIL) tablet 200 mg    Note to Pharmacy:  Take 1 tablet one day and 2 the next day.     200 mg Oral Every other day 06/13/18 2339     06/14/18 0000  piperacillin-tazobactam (ZOSYN) IVPB 3.375 g  Status:  Discontinued     3.375 g 100 mL/hr over 30 Minutes Intravenous Every 6 hours 06/13/18 2331 06/13/18 2340   06/14/18 0000  piperacillin-tazobactam (ZOSYN) IVPB 3.375 g     3.375 g 12.5 mL/hr over 240 Minutes Intravenous Every 8 hours 06/13/18 2340     06/13/18 1630  piperacillin-tazobactam (ZOSYN) IVPB 3.375 g     3.375 g 100 mL/hr over 30 Minutes Intravenous  Once 06/13/18 1615 06/13/18 1713        Subjective: Patient seen and evaluated today with no new acute complaints or concerns. No acute concerns or events noted overnight.  She has had one very mild episode of loose stool this a.m., but this is much improved.  She has no crampy abdominal pain and is overall starting to feel much better.  She is tolerating her clear liquid diet and would like to advance.  No further nausea  or vomiting.  Objective: Vitals:   06/14/18 2333 06/15/18 0004 06/15/18 0432 06/15/18 0628  BP:      Pulse:      Resp:      Temp: (!) 100.5 F (38.1 C) 99.6 F (37.6 C) 98.6 F (37 C)   TempSrc: Oral Oral Oral   SpO2:      Weight:    72.1 kg  Height:        Intake/Output Summary (Last 24 hours) at 06/15/2018 0726 Last data filed at 06/15/2018 0611 Gross per 24 hour  Intake 1010 ml  Output 3 ml  Net 1007 ml   Filed Weights   06/13/18 1837 06/13/18 2248  06/15/18 0628  Weight: 65.8 kg 68.7 kg 72.1 kg    Examination:  General exam: Appears calm and comfortable  Respiratory system: Clear to auscultation. Respiratory effort normal. Cardiovascular system: S1 & S2 heard, RRR. No JVD, murmurs, rubs, gallops or clicks. No pedal edema. Gastrointestinal system: Abdomen is nondistended, soft and nontender. No organomegaly or masses felt. Normal bowel sounds heard. Central nervous system: Alert and oriented. No focal neurological deficits. Extremities: Symmetric 5 x 5 power. Skin: No rashes, lesions or ulcers Psychiatry: Judgement and insight appear normal. Mood & affect appropriate.     Data Reviewed: I have personally reviewed following labs and imaging studies  CBC: Recent Labs  Lab 06/11/18 1022 06/13/18 1319 06/14/18 0015 06/15/18 0221  WBC 8.5 10.4 12.0* 8.9  NEUTROABS  --  9.0*  --   --   HGB 12.2 11.2* 10.0* 9.5*  HCT 36.8 35.2* 31.7* 30.5*  MCV 84 87.1 86.8 88.2  PLT 430 333 280 010   Basic Metabolic Panel: Recent Labs  Lab 06/11/18 1022 06/13/18 1319 06/14/18 0015 06/15/18 0221  NA 142 135 143 135  K 4.4 2.6* 3.0* 3.4*  CL 101 102 113* 103  CO2 20 17* 21* 18*  GLUCOSE 90 138* 104* 81  BUN 8 5* <5* <5*  CREATININE 0.61 0.93 0.72 1.02*  CALCIUM 9.5 8.5* 7.8* 7.9*  MG  --  1.5*  --  2.1   GFR: Estimated Creatinine Clearance: 62.1 mL/min (A) (by C-G formula based on SCr of 1.02 mg/dL (H)). Liver Function Tests: Recent Labs  Lab 06/11/18 1022 06/13/18 1319 06/14/18 0015  AST 38 31 28  ALT 30 25 23   ALKPHOS 98 86 77  BILITOT 0.3 1.0 0.9  PROT 7.6 6.7 6.0*  ALBUMIN 4.0 2.5* 2.2*   Recent Labs  Lab 06/14/18 0015  LIPASE 27   No results for input(s): AMMONIA in the last 168 hours. Coagulation Profile: No results for input(s): INR, PROTIME in the last 168 hours. Cardiac Enzymes: Recent Labs  Lab 06/14/18 0015 06/15/18 0221  TROPONINI 0.22* 0.16*   BNP (last 3 results) No results for input(s):  PROBNP in the last 8760 hours. HbA1C: No results for input(s): HGBA1C in the last 72 hours. CBG: No results for input(s): GLUCAP in the last 168 hours. Lipid Profile: No results for input(s): CHOL, HDL, LDLCALC, TRIG, CHOLHDL, LDLDIRECT in the last 72 hours. Thyroid Function Tests: No results for input(s): TSH, T4TOTAL, FREET4, T3FREE, THYROIDAB in the last 72 hours. Anemia Panel: No results for input(s): VITAMINB12, FOLATE, FERRITIN, TIBC, IRON, RETICCTPCT in the last 72 hours. Sepsis Labs: Recent Labs  Lab 06/13/18 1319 06/13/18 1841  LATICACIDVEN 1.9 0.9    Recent Results (from the past 240 hour(s))  Blood Culture (routine x 2)     Status: None (Preliminary result)  Collection Time: 06/13/18  1:19 PM  Result Value Ref Range Status   Specimen Description BLOOD LEFT ANTECUBITAL  Final   Special Requests   Final    BOTTLES DRAWN AEROBIC AND ANAEROBIC Blood Culture results may not be optimal due to an excessive volume of blood received in culture bottles   Culture  Setup Time   Final    IN BOTH AEROBIC AND ANAEROBIC BOTTLES GRAM NEGATIVE RODS Organism ID to follow CRITICAL RESULT CALLED TO, READ BACK BY AND VERIFIED WITH: JOHN ORIET PHARMACIST @ 0600 ON 06/14/18 BY Mahnke Z.  Performed at [facility], [place] [address] [place], [place] [address]    Culture GRAM NEGATIVE RODS  Final   Report Status PENDING  Incomplete  Blood Culture ID Panel (Reflexed)     Status: Abnormal   Collection Time: 06/13/18  1:19 PM  Result Value Ref Range Status   Enterococcus species NOT DETECTED NOT DETECTED Final   Listeria monocytogenes NOT DETECTED NOT DETECTED Final   Staphylococcus species NOT DETECTED NOT DETECTED Final   Staphylococcus aureus (BCID) NOT DETECTED NOT DETECTED Final   Streptococcus species NOT DETECTED NOT DETECTED Final   Streptococcus agalactiae NOT DETECTED NOT DETECTED Final   Streptococcus pneumoniae NOT DETECTED NOT DETECTED Final   Streptococcus pyogenes NOT  DETECTED NOT DETECTED Final   Acinetobacter baumannii NOT DETECTED NOT DETECTED Final   Enterobacteriaceae species DETECTED (A) NOT DETECTED Final    Comment: Enterobacteriaceae represent a large family of gram-negative bacteria, not a single organism. CRITICAL RESULT CALLED TO, READ BACK BY AND VERIFIED WITH: JOHN ORIET PHARMACIST @ 0600 ON 06/14/18 BY Dula Z.     Enterobacter cloacae complex NOT DETECTED NOT DETECTED Final   Escherichia coli DETECTED (A) NOT DETECTED Final    Comment: CRITICAL RESULT CALLED TO, READ BACK BY AND VERIFIED WITH: JOHN ORIET PHARMACIST @ 0600 ON 06/14/18 BY [place] Z.     Klebsiella oxytoca NOT DETECTED NOT DETECTED Final   Klebsiella pneumoniae NOT DETECTED NOT DETECTED Final   Proteus species NOT DETECTED NOT DETECTED Final   Serratia marcescens NOT DETECTED NOT DETECTED Final   Carbapenem resistance NOT DETECTED NOT DETECTED Final   Haemophilus influenzae NOT DETECTED NOT DETECTED Final   Neisseria meningitidis NOT DETECTED NOT DETECTED Final   Pseudomonas aeruginosa NOT DETECTED NOT DETECTED Final   Candida albicans NOT DETECTED NOT DETECTED Final   Candida glabrata NOT DETECTED NOT DETECTED Final   Candida krusei NOT DETECTED NOT DETECTED Final   Candida parapsilosis NOT DETECTED NOT DETECTED Final   Candida tropicalis NOT DETECTED NOT DETECTED Final    Comment: Performed at [facility], [place] [address] [place], [place] [address]  Blood Culture (routine x 2)     Status: None (Preliminary result)   Collection Time: 06/13/18  1:45 PM  Result Value Ref Range Status   Specimen Description BLOOD RIGHT ANTECUBITAL  Final   Special Requests   Final    BOTTLES DRAWN AEROBIC AND ANAEROBIC Blood Culture results may not be optimal due to an excessive volume of blood received in culture bottles   Culture  Setup Time   Final    GRAM NEGATIVE RODS ANAEROBIC BOTTLE ONLY CRITICAL VALUE NOTED.  VALUE IS CONSISTENT WITH PREVIOUSLY REPORTED AND CALLED  VALUE. Performed at [facility], [place] [address], [place], [place] [address]    Culture GRAM NEGATIVE RODS  Final   Report Status PENDING  Incomplete  SARS Coronavirus 2 (CEPHEID-  Performed in [facility] hospital lab), Hosp Order     Status: None   Collection Time: 06/13/18  1:50 PM  Result Value Ref Range Status   SARS Coronavirus 2 NEGATIVE NEGATIVE Final    Comment: (NOTE) If result is NEGATIVE SARS-CoV-2 target nucleic acids are NOT DETECTED. The SARS-CoV-2 RNA is generally detectable in upper and lower  respiratory specimens during the acute phase of infection. The lowest  concentration of SARS-CoV-2 viral copies this assay can detect is 250  copies / mL. A negative result does not preclude SARS-CoV-2 infection  and should not be used as the sole basis for treatment or other  patient management decisions.  A negative result may occur with  improper specimen collection / handling, submission of specimen other  than nasopharyngeal swab, presence of viral mutation(s) within the  areas targeted by this assay, and inadequate number of viral copies  (<250 copies / mL). A negative result must be combined with clinical  observations, patient history, and epidemiological information. If result is POSITIVE SARS-CoV-2 target nucleic acids are DETECTED. The SARS-CoV-2 RNA is generally detectable in upper and lower  respiratory specimens dur ing the acute phase of infection.  Positive  results are indicative of active infection with SARS-CoV-2.  Clinical  correlation with patient history and other diagnostic information is  necessary to determine patient infection status.  Positive results do  not rule out bacterial infection or co-infection with other viruses. If result is PRESUMPTIVE POSTIVE SARS-CoV-2 nucleic acids MAY BE PRESENT.   A presumptive positive result was obtained on the submitted specimen  and confirmed on repeat testing.  While 2019 novel coronavirus  (SARS-CoV-2)  nucleic acids may be present in the submitted sample  additional confirmatory testing may be necessary for epidemiological  and / or clinical management purposes  to differentiate between  SARS-CoV-2 and other Sarbecovirus currently known to infect humans.  If clinically indicated additional testing with an alternate test  methodology [phone]) is advised. The SARS-CoV-2 RNA is generally  detectable in upper and lower respiratory sp ecimens during the acute  phase of infection. The expected result is Negative. Fact Sheet for Patients:  StrictlyIdeas.no Fact Sheet for Healthcare Providers: BankingDealers.co.za This test is not yet approved or cleared by the [place] FDA and has been authorized for detection and/or diagnosis of SARS-CoV-2 by FDA under an Emergency Use Authorization (EUA).  This EUA will remain in effect (meaning this test can be used) for the duration of the COVID-19 declaration under Section 564(b)(1) of the Act, 21 U.S.C. section 360bbb-3(b)(1), unless the authorization is terminated or revoked sooner. Performed at [facility], [place] [address], [place], [place] [address]   MRSA PCR Screening     Status: None   Collection Time: 06/14/18  3:39 AM  Result Value Ref Range Status   MRSA by PCR NEGATIVE NEGATIVE Final    Comment:        The GeneXpert MRSA Assay (FDA approved for NASAL specimens only), is one component of a comprehensive MRSA colonization surveillance program. It is not intended to diagnose MRSA infection nor to guide or monitor treatment for MRSA infections. Performed at [facility], [place] [address], [place], [place] [address]   C difficile quick scan w PCR reflex     Status: None   Collection Time: 06/14/18 10:27 AM  Result Value Ref Range Status   C Diff antigen NEGATIVE NEGATIVE Final   C Diff toxin NEGATIVE NEGATIVE Final  C Diff interpretation No C. difficile detected.  Final      Comment: Performed at [facility], [place] [address], [place], [place] [address]  Gastrointestinal Panel by PCR , Stool     Status: None   Collection Time: 06/14/18 10:28 AM  Result Value Ref Range Status   Campylobacter species NOT DETECTED NOT DETECTED Final   Plesimonas shigelloides NOT DETECTED NOT DETECTED Final   Salmonella species NOT DETECTED NOT DETECTED Final   Yersinia enterocolitica NOT DETECTED NOT DETECTED Final   Vibrio species NOT DETECTED NOT DETECTED Final   Vibrio cholerae NOT DETECTED NOT DETECTED Final   Enteroaggregative E coli (EAEC) NOT DETECTED NOT DETECTED Final   Enteropathogenic E coli (EPEC) NOT DETECTED NOT DETECTED Final   Enterotoxigenic E coli (ETEC) NOT DETECTED NOT DETECTED Final   Shiga like toxin producing E coli (STEC) NOT DETECTED NOT DETECTED Final   Shigella/Enteroinvasive E coli (EIEC) NOT DETECTED NOT DETECTED Final   Cryptosporidium NOT DETECTED NOT DETECTED Final   Cyclospora cayetanensis NOT DETECTED NOT DETECTED Final   Entamoeba histolytica NOT DETECTED NOT DETECTED Final   Giardia lamblia NOT DETECTED NOT DETECTED Final   Adenovirus F40/41 NOT DETECTED NOT DETECTED Final   Astrovirus NOT DETECTED NOT DETECTED Final   Norovirus GI/GII NOT DETECTED NOT DETECTED Final   Rotavirus A NOT DETECTED NOT DETECTED Final   Sapovirus (I, II, IV, and V) NOT DETECTED NOT DETECTED Final    Comment: Performed at [facility], [address] [place], [place] [address]         Radiology Studies: Ct Abdomen Pelvis W Contrast  Result Date: 06/13/2018 CLINICAL DATA:  Acute onset of generalized abdominal pain, nausea and vomiting, productive cough, shortness of breath and fever up to 105 degrees Fahrenheit. EXAM: CT ABDOMEN AND PELVIS WITH CONTRAST TECHNIQUE: Multidetector CT imaging of the abdomen and pelvis was performed using the standard protocol following bolus administration of intravenous contrast. CONTRAST:  159mL OMNIPAQUE  IOHEXOL 300 MG/ML IV. COMPARISON:  12/13/2015. FINDINGS: Lower chest: Linear atelectasis involving the LEFT LOWER LOBE. Visualized lung bases otherwise clear. Heart size upper normal to slightly enlarged. RIGHT coronary artery stent. Hepatobiliary: Liver normal in size and appearance. Gallbladder normal in appearance without calcified gallstones. No biliary ductal dilation. Pancreas: Normal in appearance without evidence of mass, ductal dilation, or inflammation. Spleen: Normal in size and appearance. Adrenals/Urinary Tract: Normal appearing adrenal glands. Large benign simple cyst arising from the LOWER pole of the RIGHT kidney measuring maximally 9.5 cm, increased in size since 2017 where it measured approximately 8 cm. No solid mass involving either kidney. No hydronephrosis. No urinary tract calculi. Normal appearing urinary bladder. Stomach/Bowel: Stomach normal in appearance for the degree of distention. Normal-appearing small bowel. Entire colon decompressed, containing liquid stool. Opaque ingested material within the cecum and ascending colon. Mild edema in the fat adjacent to the cecum and ascending colon. Sigmoid colon diverticulosis without evidence of acute diverticulitis. Normal-appearing appendix low in the RIGHT side of the pelvis. Vascular/Lymphatic: Moderate aortoiliac atherosclerosis without evidence of aneurysm. No pathologic lymphadenopathy. Reproductive: Degenerating, partially calcified fibroid arising from the uterine fundus measuring approximately 4 cm. No adnexal masses. Other: None. 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 Facet degenerative changes at L4-5 and L5-S1. No acute findings. IMPRESSION: 1. Mild colitis involving the cecum and ascending colon. 2. Sigmoid colon diverticulosis without evidence of acute diverticulitis. 3. Approximate 4 cm degenerating, partially calcified uterine fundal fibroid.  4. Large approximate 9.5  cm benign simple cyst arising from the LOWER pole the RIGHT kidney. Cysts that are this large can occasionally be symptomatic-does the patient have RIGHT flank pain? Aortic Atherosclerosis (ICD10-170.0) Electronically Signed   By: Evangeline Dakin M.D.   On: 06/13/2018 16:33   Dg Chest Port 1 View  Result Date: 06/13/2018 CLINICAL DATA:  Acute onset of shortness of breath. EXAM: PORTABLE CHEST 1 VIEW COMPARISON:  CTA chest 06/03/2018 and earlier. Chest x-rays 06/01/2018 and earlier. FINDINGS: Cardiac silhouette mildly enlarged for AP portable technique, unchanged. Lungs clear. Bronchovascular markings normal. Pulmonary vascularity normal. No visible pleural effusions. No pneumothorax. IMPRESSION: Stable mild cardiomegaly. No acute cardiopulmonary disease. Electronically Signed   By: Evangeline Dakin M.D.   On: 06/13/2018 21:30        Scheduled Meds:  aspirin EC  81 mg Oral Daily   atorvastatin  80 mg Oral q1800   Chlorhexidine Gluconate Cloth  6 each Topical Q0600   hydroxychloroquine  200 mg Oral QODAY   hydroxychloroquine  400 mg Oral QODAY   metoprolol tartrate  12.5 mg Oral BID   mycophenolate  1,500 mg Oral BID   potassium chloride  40 mEq Oral BID   sodium chloride flush  3 mL Intravenous Q12H   ticagrelor  90 mg Oral BID   Continuous Infusions:  piperacillin-tazobactam (ZOSYN)  IV 3.375 g (06/14/18 2307)     LOS: 2 days    Time spent: 30 minutes    Ernie Kasler Darleen Crocker, DO [facility] Pager [phone]  If 7PM-7AM, please contact night-coverage [email or web address] Password [facility] 06/15/2018, 7:26 AM

## 2018-06-15 NOTE — Progress Notes (Signed)
Daily Rounding Note  06/15/2018, 9:22 AM  LOS: 2 days   SUBJECTIVE:   Chief complaint:  Right sided colitis    The worst of her symptoms with the nausea and vomiting, this is resolved.  Stools are liquid, greenish brown-colored.  Abdominal pain much improved.  Hungry.  Feels like she can eat something more than clear liquids which she is tolerating.  No dizziness, no chest pain, no shortness of breath.  OBJECTIVE:         Vital signs in last 24 hours:    Temp:  [98.4 F (36.9 C)-100.5 F (38.1 C)] 98.4 F (36.9 C) (05/18 0835) Resp:  [15-24] 15 (05/18 0708) BP: (96-112)/(67-75) 109/75 (05/18 0708) Weight:  [72.1 kg] 72.1 kg (05/18 0628) Last BM Date: 06/14/18 Filed Weights   06/13/18 1837 06/13/18 2248 06/15/18 3016  Weight: 65.8 kg 68.7 kg 72.1 kg   General: Pleasant, well-appearing, comfortable Heart: RRR. Chest: Clear bilaterally.  No labored breathing, no cough. Abdomen: Soft.  Minimal mid abdominal, right abdomen tenderness.  No guarding, no rebound.  Active bowel sounds. Extremities: No CCE.  Feet are warm and well-perfused. Neuro/Psych: Alert.  Oriented x3.  Moves all 4 limbs, strength not tested.  No gross deficits.  Calm and pleasant.  Intake/Output from previous day: 05/17 0701 - 05/18 0700 In: 1010 [P.O.:240; I.V.:720; IV Piggyback:50] Out: 3 [Urine:3]  Intake/Output this shift: No intake/output data recorded.  Lab Results: Recent Labs    06/13/18 1319 06/14/18 0015 06/15/18 0221  WBC 10.4 12.0* 8.9  HGB 11.2* 10.0* 9.5*  HCT 35.2* 31.7* 30.5*  PLT 333 280 248   BMET Recent Labs    06/13/18 1319 06/14/18 0015 06/15/18 0221  NA 135 143 135  K 2.6* 3.0* 3.4*  CL 102 113* 103  CO2 17* 21* 18*  GLUCOSE 138* 104* 81  BUN 5* <5* <5*  CREATININE 0.93 0.72 1.02*  CALCIUM 8.5* 7.8* 7.9*   LFT Recent Labs    06/13/18 1319 06/14/18 0015  PROT 6.7 6.0*  ALBUMIN 2.5* 2.2*  AST 31 28    ALT 25 23  ALKPHOS 86 77  BILITOT 1.0 0.9   PT/INR No results for input(s): LABPROT, INR in the last 72 hours.  Studies/Results: Ct Abdomen Pelvis W Contrast  Result Date: 06/13/2018 CLINICAL DATA:  Acute onset of generalized abdominal pain, nausea and vomiting, productive cough, shortness of breath and fever up to 105 degrees Fahrenheit. EXAM: CT ABDOMEN AND PELVIS WITH CONTRAST TECHNIQUE: Multidetector CT imaging of the abdomen and pelvis was performed using the standard protocol following bolus administration of intravenous contrast. CONTRAST:  143mL OMNIPAQUE IOHEXOL 300 MG/ML IV. COMPARISON:  12/13/2015. FINDINGS: Lower chest: Linear atelectasis involving the LEFT LOWER LOBE. Visualized lung bases otherwise clear. Heart size upper normal to slightly enlarged. RIGHT coronary artery stent. Hepatobiliary: Liver normal in size and appearance. Gallbladder normal in appearance without calcified gallstones. No biliary ductal dilation. Pancreas: Normal in appearance without evidence of mass, ductal dilation, or inflammation. Spleen: Normal in size and appearance. Adrenals/Urinary Tract: Normal appearing adrenal glands. Large benign simple cyst arising from the LOWER pole of the RIGHT kidney measuring maximally 9.5 cm, increased in size since 2017 where it measured approximately 8 cm. No solid mass involving either kidney. No hydronephrosis. No urinary tract calculi. Normal appearing urinary bladder. Stomach/Bowel: Stomach normal in appearance for the degree of distention. Normal-appearing small bowel. Entire colon decompressed, containing liquid stool. Opaque ingested material within  the cecum and ascending colon. Mild edema in the fat adjacent to the cecum and ascending colon. Sigmoid colon diverticulosis without evidence of acute diverticulitis. Normal-appearing appendix low in the RIGHT side of the pelvis. Vascular/Lymphatic: Moderate aortoiliac atherosclerosis without evidence of aneurysm. No pathologic  lymphadenopathy. Reproductive: Degenerating, partially calcified fibroid arising from the uterine fundus measuring approximately 4 cm. No adnexal masses. Other: None. 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 Facet degenerative changes at L4-5 and L5-S1. No acute findings. IMPRESSION: 1. Mild colitis involving the cecum and ascending colon. 2. Sigmoid colon diverticulosis without evidence of acute diverticulitis. 3. Approximate 4 cm degenerating, partially calcified uterine fundal fibroid. 4. Large approximate 9.5 cm benign simple cyst arising from the LOWER pole the RIGHT kidney. Cysts that are this large can occasionally be symptomatic-does the patient have RIGHT flank pain? Aortic Atherosclerosis (ICD10-170.0) Electronically Signed   By: Evangeline Dakin M.D.   On: 06/13/2018 16:33   Dg Chest Port 1 View  Result Date: 06/13/2018 CLINICAL DATA:  Acute onset of shortness of breath. EXAM: PORTABLE CHEST 1 VIEW COMPARISON:  CTA chest 06/03/2018 and earlier. Chest x-rays 06/01/2018 and earlier. FINDINGS: Cardiac silhouette mildly enlarged for AP portable technique, unchanged. Lungs clear. Bronchovascular markings normal. Pulmonary vascularity normal. No visible pleural effusions. No pneumothorax. IMPRESSION: Stable mild cardiomegaly. No acute cardiopulmonary disease. Electronically Signed   By: Evangeline Dakin M.D.   On: 06/13/2018 21:30   Scheduled Meds:  aspirin EC  81 mg Oral Daily   atorvastatin  80 mg Oral q1800   Chlorhexidine Gluconate Cloth  6 each Topical Q0600   hydroxychloroquine  200 mg Oral QODAY   hydroxychloroquine  400 mg Oral QODAY   metoprolol tartrate  12.5 mg Oral BID   mycophenolate  1,500 mg Oral BID   potassium chloride  40 mEq Oral BID   sodium chloride flush  3 mL Intravenous Q12H   ticagrelor  90 mg Oral BID   Continuous Infusions:  piperacillin-tazobactam (ZOSYN)  IV 3.375 g (06/15/18 0831)   PRN  Meds:.acetaminophen **OR** acetaminophen, ondansetron **OR** ondansetron (ZOFRAN) IV   ASSESMENT:   *   Acute cecal, ascending colitis.  Symptoms of watery, non-bloody diarrhea, generalized abdominal pain, nonbloody emesis. 2016 colonoscopy with adenomatous and hyperplastic polyps, diverticulosis.  *     STEMI admission 05/30/18 with cardiogenic shock requiring vent support.  Also experienced shock liver with elevated LFTs...  S/p DES x 2 to RCA.  Discharged on Brilinta, added to 81 mg ASA.  By echo EF 50 to 55%, impaired left ventricular relaxation, mild reduction in right ventricular systolic function.Kary Kos and aspirin or not on hold.  *   Normocytic anemia.  Baseline Hgb 9.5 to 11.  Since arrival Hgb 12.2 >> 9.5.    *   Hypokalemia.  *   Lupus.  Chronic hydroxychloroquine for this.   PLAN   *   Advance diet to cardiac.  At present patient has no indications for acute/urgent colonoscopy. Could possibly go home later today or tomorrow if continues to do well and tolerates solid food. Arrange GI follow-up with Dr. Rush Landmark.      Azucena Freed  06/15/2018, 9:22 AM Phone [phone]

## 2018-06-15 NOTE — Progress Notes (Signed)
Progress Note  Patient Name: Samantha [name] Date of Encounter: 06/15/2018  Primary Cardiologist: Lauree Chandler, MD   Subjective   NO chest pain or abd pain. Feels much better.   Inpatient Medications    Scheduled Meds:  aspirin EC  81 mg Oral Daily   atorvastatin  80 mg Oral q1800   Chlorhexidine Gluconate Cloth  6 each Topical Q0600   hydroxychloroquine  200 mg Oral QODAY   hydroxychloroquine  400 mg Oral QODAY   metoprolol tartrate  12.5 mg Oral BID   mycophenolate  1,500 mg Oral BID   potassium chloride  40 mEq Oral BID   sodium chloride flush  3 mL Intravenous Q12H   ticagrelor  90 mg Oral BID   Continuous Infusions:  piperacillin-tazobactam (ZOSYN)  IV 3.375 g (06/14/18 2307)   PRN Meds: acetaminophen **OR** acetaminophen, ondansetron **OR** ondansetron (ZOFRAN) IV   Vital Signs    Vitals:   06/14/18 2333 06/15/18 0004 06/15/18 0432 06/15/18 0628  BP:      Pulse:      Resp:      Temp: (!) 100.5 F (38.1 C) 99.6 F (37.6 C) 98.6 F (37 C)   TempSrc: Oral Oral Oral   SpO2:      Weight:    72.1 kg  Height:        Intake/Output Summary (Last 24 hours) at 06/15/2018 0748 Last data filed at 06/15/2018 6222 Gross per 24 hour  Intake 1010 ml  Output 3 ml  Net 1007 ml   Last 3 Weights 06/15/2018 06/13/2018 06/13/2018  Weight (lbs) 158 lb 15.2 oz 151 lb 7.3 oz 145 lb  Weight (kg) 72.1 kg 68.7 kg 65.772 kg      Telemetry    Sinus- Personally Reviewed  ECG    NO AM EKG - Personally Reviewed  Physical Exam   GEN: No acute distress.   Neck: No JVD Cardiac: RRR, no murmurs, rubs, or gallops.  Respiratory: Clear to auscultation bilaterally. GI: Soft, nontender, non-distended  MS: No edema; No deformity. Neuro:  Nonfocal  Psych: Normal affect   Labs    Chemistry Recent Labs  Lab 06/11/18 1022 06/13/18 1319 06/14/18 0015 06/15/18 0221  NA 142 135 143 135  K 4.4 2.6* 3.0* 3.4*  CL 101 102 113* 103  CO2 20 17* 21* 18*    GLUCOSE 90 138* 104* 81  BUN 8 5* <5* <5*  CREATININE 0.61 0.93 0.72 1.02*  CALCIUM 9.5 8.5* 7.8* 7.9*  PROT 7.6 6.7 6.0*  --   ALBUMIN 4.0 2.5* 2.2*  --   AST 38 31 28  --   ALT 30 25 23   --   ALKPHOS 98 86 77  --   BILITOT 0.3 1.0 0.9  --   GFRNONAA 102 >60 >60 >60  GFRAA 117 >60 >60 >60  ANIONGAP  --  16* 9 14     Hematology Recent Labs  Lab 06/13/18 1319 06/14/18 0015 06/15/18 0221  WBC 10.4 12.0* 8.9  RBC 4.04 3.65* 3.46*  HGB 11.2* 10.0* 9.5*  HCT 35.2* 31.7* 30.5*  MCV 87.1 86.8 88.2  MCH 27.7 27.4 27.5  MCHC 31.8 31.5 31.1  RDW 13.3 13.5 14.1  PLT 333 280 248    Cardiac Enzymes Recent Labs  Lab 06/14/18 0015 06/15/18 0221  TROPONINI 0.22* 0.16*   No results for input(s): TROPIPOC in the last 168 hours.   BNPNo results for input(s): BNP, PROBNP in the last 168 hours.  DDimer No results for input(s): DDIMER in the last 168 hours.   Radiology    Ct Abdomen Pelvis W Contrast  Result Date: 06/13/2018 CLINICAL DATA:  Acute onset of generalized abdominal pain, nausea and vomiting, productive cough, shortness of breath and fever up to 105 degrees Fahrenheit. EXAM: CT ABDOMEN AND PELVIS WITH CONTRAST TECHNIQUE: Multidetector CT imaging of the abdomen and pelvis was performed using the standard protocol following bolus administration of intravenous contrast. CONTRAST:  137mL OMNIPAQUE IOHEXOL 300 MG/ML IV. COMPARISON:  12/13/2015. FINDINGS: Lower chest: Linear atelectasis involving the LEFT LOWER LOBE. Visualized lung bases otherwise clear. Heart size upper normal to slightly enlarged. RIGHT coronary artery stent. Hepatobiliary: Liver normal in size and appearance. Gallbladder normal in appearance without calcified gallstones. No biliary ductal dilation. Pancreas: Normal in appearance without evidence of mass, ductal dilation, or inflammation. Spleen: Normal in size and appearance. Adrenals/Urinary Tract: Normal appearing adrenal glands. Large benign simple cyst  arising from the LOWER pole of the RIGHT kidney measuring maximally 9.5 cm, increased in size since 2017 where it measured approximately 8 cm. No solid mass involving either kidney. No hydronephrosis. No urinary tract calculi. Normal appearing urinary bladder. Stomach/Bowel: Stomach normal in appearance for the degree of distention. Normal-appearing small bowel. Entire colon decompressed, containing liquid stool. Opaque ingested material within the cecum and ascending colon. Mild edema in the fat adjacent to the cecum and ascending colon. Sigmoid colon diverticulosis without evidence of acute diverticulitis. Normal-appearing appendix low in the RIGHT side of the pelvis. Vascular/Lymphatic: Moderate aortoiliac atherosclerosis without evidence of aneurysm. No pathologic lymphadenopathy. Reproductive: Degenerating, partially calcified fibroid arising from the uterine fundus measuring approximately 4 cm. No adnexal masses. Other: None. 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 Facet degenerative changes at L4-5 and L5-S1. No acute findings. IMPRESSION: 1. Mild colitis involving the cecum and ascending colon. 2. Sigmoid colon diverticulosis without evidence of acute diverticulitis. 3. Approximate 4 cm degenerating, partially calcified uterine fundal fibroid. 4. Large approximate 9.5 cm benign simple cyst arising from the LOWER pole the RIGHT kidney. Cysts that are this large can occasionally be symptomatic-does the patient have RIGHT flank pain? Aortic Atherosclerosis (ICD10-170.0) Electronically Signed   By: Evangeline Dakin M.D.   On: 06/13/2018 16:33   Dg Chest Port 1 View  Result Date: 06/13/2018 CLINICAL DATA:  Acute onset of shortness of breath. EXAM: PORTABLE CHEST 1 VIEW COMPARISON:  CTA chest 06/03/2018 and earlier. Chest x-rays 06/01/2018 and earlier. FINDINGS: Cardiac silhouette mildly enlarged for AP portable technique, unchanged. Lungs clear.  Bronchovascular markings normal. Pulmonary vascularity normal. No visible pleural effusions. No pneumothorax. IMPRESSION: Stable mild cardiomegaly. No acute cardiopulmonary disease. Electronically Signed   By: Evangeline Dakin M.D.   On: 06/13/2018 21:30    Cardiac Studies     Patient Profile     58 y.o. female with history of CAD s/p inferior STEMI May 30, 2018 with PCI/DES of theRCA, HLD, lupus and tobacco abuse who as re-admitted with abdominal pain, fever, chills, nausea, vomiting and diarrhea on 06/13/18 and found to be hypertensive and tachycardic. She has been found to have sepsis with E.Coli bacteremia likely from colitis.   Assessment & Plan    1. CAD: No chest pain. BP is now normal. Troponin trend downward from large MI 2 weeks ago. No need for IV heparin. Continue ASA, Brilinta, statin and beta blocker. No further cardiac workup.   2. Hyperlipidemia: Continue statin.   [facility] will sign off.  Medication Recommendations:  Continue current medications.  Other recommendations (labs, testing, etc):  None Follow up as an outpatient:  07/08/18 as planned previously  For questions or updates, please contact [place] Please consult [email or web address] for contact info under        Signed, Lauree Chandler, MD  06/15/2018, 7:48 AM

## 2018-06-15 NOTE — Care Management Important Message (Signed)
Important Message  Patient Details  Name: Samantha [name] MRN: [id number] Date of Birth: 09-07-1960   Medicare Important Message Given:    signed and given    Sharin Mons, RN 06/15/2018, 9:27 AM

## 2018-06-16 ENCOUNTER — Encounter (HOSPITAL_COMMUNITY): Payer: Self-pay

## 2018-06-16 ENCOUNTER — Inpatient Hospital Stay (HOSPITAL_COMMUNITY): Admission: RE | Admit: 2018-06-16 | Payer: Medicare Other | Source: Ambulatory Visit

## 2018-06-16 DIAGNOSIS — K529 Noninfective gastroenteritis and colitis, unspecified: Secondary | ICD-10-CM

## 2018-06-16 LAB — MAGNESIUM: Magnesium: 2 mg/dL (ref 1.7–2.4)

## 2018-06-16 LAB — BASIC METABOLIC PANEL
Anion gap: 11 (ref 5–15)
BUN: <5 mg/dL — ABNORMAL LOW (ref 6–20)
CO2: 22 mmol/L (ref 22–32)
Calcium: 8.3 mg/dL — ABNORMAL LOW (ref 8.9–10.3)
Chloride: 104 mmol/L (ref 98–111)
Creatinine, Ser: 0.73 mg/dL (ref 0.44–1.00)
GFR calc Af Amer: >60 mL/min (ref >60)
GFR calc non Af Amer: >60 mL/min (ref >60)
Glucose, Bld: 118 mg/dL — ABNORMAL HIGH (ref 70–99)
Potassium: 3.5 mmol/L (ref 3.5–5.1)
Sodium: 137 mmol/L (ref 135–145)

## 2018-06-16 LAB — CBC
HCT: 31.7 % — ABNORMAL LOW (ref 36.0–46.0)
Hemoglobin: 9.9 g/dL — ABNORMAL LOW (ref 12.0–15.0)
MCH: 27.2 pg (ref 26.0–34.0)
MCHC: 31.2 g/dL (ref 30.0–36.0)
MCV: 87.1 fL (ref 80.0–100.0)
Platelets: 286 10*3/uL (ref 150–400)
RBC: 3.64 MIL/uL — ABNORMAL LOW (ref 3.87–5.11)
RDW: 14.1 % (ref 11.5–15.5)
WBC: 4.1 10*3/uL (ref 4.0–10.5)
nRBC: 0 % (ref 0.0–0.2)

## 2018-06-16 LAB — CULTURE, BLOOD (ROUTINE X 2)

## 2018-06-16 MED ORDER — METOPROLOL TARTRATE 25 MG PO TABS: 12.5000 mg | ORAL_TABLET | Freq: Two times a day (BID) | ORAL | 3 refills | Status: DC

## 2018-06-16 MED ORDER — LOPERAMIDE HCL 2 MG PO TABS: 2.0000 mg | ORAL_TABLET | Freq: Four times a day (QID) | ORAL | 0 refills | Status: AC | PRN

## 2018-06-16 MED ORDER — CEFDINIR 300 MG PO CAPS: 300.0000 mg | ORAL_CAPSULE | Freq: Two times a day (BID) | ORAL | 0 refills | Status: AC

## 2018-06-16 NOTE — Consult Note (Signed)
   [facility] CM Inpatient Consult   06/16/2018  Samantha [name] 09/19/1960 [id number]   Patient screened forhigh risk score of 23% for unplanned readmissionwith 2 hospitalizations in the past 6 months and 30 day readmission, and to check if potential [facility] services are neededwith her Medicare/ [employer] benefits.  Per MD's history and physical on 06/13/18 reveals as follows: Samantha [name] is a 58 y.o. female with medical history significant for inferior STEMI, CAD s/p PCI to proximal and distal RCA w/ DES x2, hypertension, hyperlipidemia, and SLE, tobacco abuse- Chart review shows that patient presented to the ED with a day of sudden onset nausea, vomiting, diarrhea, fevers, chills, and right flank pain, found to have colitis in the ascending colon and subsequently found to have E. coli bacteremia from colitis.   Called to speak to patient to identify possible discharge needs but unit secretary reports that she had been discharged home.  Spoke with transition of care CM and states that patient lives alone- was independent with ADL's prior to admission, but has a supportive daughter who can provide needed assistance. Patient has transportation to home and has no further needs that have been identified.  Patient may benefit from EMMI calls to follow-up her continued recovery after discharge. Referral made for [facility] General calls post discharge.  Patient's primary care provider is Dr. Roma Schanz with [facility] at [facility], listed as providing transition of care follow-up.   For questions and additional information, please call:  Mina Babula A. Jazzma Neidhardt, BSN, RN-BC [facility] Liaison Cell: [phone]

## 2018-06-16 NOTE — TOC Transition Note (Addendum)
Transition of Care [facility]) - CM/SW Discharge Note   Patient Details  Name: Samantha [name] MRN: [id number] Date of Birth: 07/12/1960  Transition of Care [facility]) CM/SW Contact:  Sharin Mons, RN Phone Number: 06/16/2018, 9:12 AM   Clinical Narrative:    Readmitted with Colitis. Hx of CAD s/p inferior STEMI May 30, 2018 with PCI/DES of theRCA, HLD, lupus and tobacco abuse  Transition to home today. Lives alone. States has supportive daughter. PTA independent with ADL's, no DME usage. Pt has transportation to home.  Jewel Dot Lanes (Daughter)     [phone]              PCP: Roma Schanz R     Barriers to Discharge: Continued Medical Work up   Patient Goals and CMS Choice Patient states their goals for this hospitalization and ongoing recovery are:: " to get better"      Discharge Placement                       Discharge Plan and Services   Discharge Planning Services: CM Consult                                 Social Determinants of Health (SDOH) Interventions     Readmission Risk Interventions No flowsheet data found.

## 2018-06-16 NOTE — Discharge Summary (Signed)
Physician Discharge Summary  Samantha [name] UYQ:[id number] DOB: September 26, 1960 DOA: 06/13/2018  PCP: [name], DO  Admit date: 06/13/2018  Discharge date: 06/16/2018  Admitted From:Home  Disposition:  Home  Recommendations for Outpatient Follow-up:  1. Follow up with PCP in 1-2 weeks 2. Continue on Omnicef to finish the course of treatment for the next 10 days 3. Use Imodium as needed for diarrhea 4. Follow-up with cardiology outpatient on 07/08/2018 as planned previously  Home Health: None  Equipment/Devices: None  Discharge Condition: Stable  CODE STATUS: Full  Diet recommendation: Heart Healthy  Brief/Interim Summary: Per HPI: Patient is a57 y.o.femalewith history of STEMI s/p PCI with DES on 5/2, lupus on Plaquenil and CellCept, HTN, dyslipidemia presented with nausea, vomiting, diarrhea and fever, found to have colitis in the ascending colon and subsequently found to have E. coli bacteremia from colitis.  She was initially started on IV Zosyn empirically until cultures finally returned.  She is noted to be pansensitive.  GI pathogen panel has returned negative and C. difficile testing is also negative.  GI has evaluated the patient and she will require colonoscopic evaluation at some point in the near future and will follow-up with GI for this.  She was additionally noted to have some minimally elevated troponins in the setting of prior STEMI and CAD and therefore cardiology had reviewed the chart concerning the need for heparin which was never started.  She was recommended to maintain on her aspirin, statin, Brilinta, beta-blocker.  No further cardiac work-up was indicated at this time and she will follow-up outpatient with cardiology as noted above.  No other acute events have been noted during the course of this admission.  She is now tolerating her diet without any further complaints or concerns and has very minimal liquidy stools noted.  This, however is much  improved.  She will finish course of Omnicef as prescribed and use Imodium as needed.  Return to hospital if symptoms worsen or persist.  Discharge Diagnoses:  Principal Problem:   Colitis Active Problems:   Hypokalemia   Lupus (systemic lupus erythematosus) (HCC)   HTN (hypertension)   Hyperlipidemia LDL goal <70   CAD (coronary artery disease)   Cyst of right kidney    Discharge Instructions  Discharge Instructions    Diet - low sodium heart healthy   Complete by:  As directed    Increase activity slowly   Complete by:  As directed      Allergies as of 06/16/2018      Reactions   Tramadol Nausea Only   Feels weirded out      Medication List    STOP taking these medications   metoprolol succinate 100 MG 24 hr tablet Commonly known as:  TOPROL-XL     TAKE these medications   acetaminophen 500 MG tablet Commonly known as:  TYLENOL Take 500 mg by mouth every 6 (six) hours as needed for moderate pain or headache.   aspirin EC 81 MG tablet Take 1 tablet (81 mg total) by mouth daily.   atorvastatin 80 MG tablet Commonly known as:  LIPITOR Take 1 tablet (80 mg total) by mouth daily at 6 PM.   cefdinir 300 MG capsule Commonly known as:  OMNICEF Take 1 capsule (300 mg total) by mouth 2 (two) times daily for 10 days.   CellCept 500 MG tablet Generic drug:  mycophenolate Take 1,500 mg by mouth 2 (two) times daily. For lupus   cyclobenzaprine 10 MG tablet  Commonly known as:  FLEXERIL TAKE 1 TABLET(10 MG) BY MOUTH THREE TIMES DAILY AS NEEDED What changed:    how much to take  how to take this  when to take this  reasons to take this  additional instructions   hydroxychloroquine 200 MG tablet Commonly known as:  PLAQUENIL Take 200 mg by mouth as directed. Take 1 tablet one day and 2 the next day.   loperamide 2 MG tablet Commonly known as:  Imodium A-D Take 1 tablet (2 mg total) by mouth 4 (four) times daily as needed for diarrhea or loose stools.    metoprolol tartrate 25 MG tablet Commonly known as:  LOPRESSOR Take 0.5 tablets (12.5 mg total) by mouth 2 (two) times daily for 30 days. What changed:    medication strength  how much to take   multivitamin tablet Take 1 tablet by mouth daily.   nitroGLYCERIN 0.4 MG SL tablet Commonly known as:  NITROSTAT Place 1 tablet (0.4 mg total) under the tongue every 5 (five) minutes as needed for chest pain.   pantoprazole 20 MG tablet Commonly known as:  PROTONIX Take 1 tablet (20 mg total) by mouth daily.   spironolactone 25 MG tablet Commonly known as:  ALDACTONE Take 1 tablet (25 mg total) by mouth every other day.   ticagrelor 90 MG Tabs tablet Commonly known as:  BRILINTA Take 1 tablet (90 mg total) by mouth 2 (two) times daily.      Follow-up Information    Roma Schanz R, DO Follow up in 1 week(s).   Specialty:  Family Medicine Contact information: [phone] [place] [address] [phone]        Burnell Blanks, MD .   Specialty:  Cardiology Contact information: [address] [place] [address] [phone]          Allergies  Allergen Reactions  . Tramadol Nausea Only    Feels weirded out    Consultations:  GI  Cardiology   Procedures/Studies: Ct Abdomen Pelvis W Contrast  Result Date: 06/13/2018 CLINICAL DATA:  Acute onset of generalized abdominal pain, nausea and vomiting, productive cough, shortness of breath and fever up to 105 degrees Fahrenheit. EXAM: CT ABDOMEN AND PELVIS WITH CONTRAST TECHNIQUE: Multidetector CT imaging of the abdomen and pelvis was performed using the standard protocol following bolus administration of intravenous contrast. CONTRAST:  132mL OMNIPAQUE IOHEXOL 300 MG/ML IV. COMPARISON:  12/13/2015. FINDINGS: Lower chest: Linear atelectasis involving the LEFT LOWER LOBE. Visualized lung bases otherwise clear. Heart size upper normal to slightly enlarged. RIGHT coronary artery  stent. Hepatobiliary: Liver normal in size and appearance. Gallbladder normal in appearance without calcified gallstones. No biliary ductal dilation. Pancreas: Normal in appearance without evidence of mass, ductal dilation, or inflammation. Spleen: Normal in size and appearance. Adrenals/Urinary Tract: Normal appearing adrenal glands. Large benign simple cyst arising from the LOWER pole of the RIGHT kidney measuring maximally 9.5 cm, increased in size since 2017 where it measured approximately 8 cm. No solid mass involving either kidney. No hydronephrosis. No urinary tract calculi. Normal appearing urinary bladder. Stomach/Bowel: Stomach normal in appearance for the degree of distention. Normal-appearing small bowel. Entire colon decompressed, containing liquid stool. Opaque ingested material within the cecum and ascending colon. Mild edema in the fat adjacent to the cecum and ascending colon. Sigmoid colon diverticulosis without evidence of acute diverticulitis. Normal-appearing appendix low in the RIGHT side of the pelvis. Vascular/Lymphatic: Moderate aortoiliac atherosclerosis without evidence of aneurysm. No  pathologic lymphadenopathy. Reproductive: Degenerating, partially calcified fibroid arising from the uterine fundus measuring approximately 4 cm. No adnexal masses. Other: None. 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 Facet degenerative changes at L4-5 and L5-S1. No acute findings. IMPRESSION: 1. Mild colitis involving the cecum and ascending colon. 2. Sigmoid colon diverticulosis without evidence of acute diverticulitis. 3. Approximate 4 cm degenerating, partially calcified uterine fundal fibroid. 4. Large approximate 9.5 cm benign simple cyst arising from the LOWER pole the RIGHT kidney. Cysts that are this large can occasionally be symptomatic-does the patient have RIGHT flank pain? Aortic Atherosclerosis (ICD10-170.0) Electronically Signed    By: Evangeline Dakin M.D.   On: 06/13/2018 16:33   Dg Chest Port 1 View  Result Date: 06/13/2018 CLINICAL DATA:  Acute onset of shortness of breath. EXAM: PORTABLE CHEST 1 VIEW COMPARISON:  CTA chest 06/03/2018 and earlier. Chest x-rays 06/01/2018 and earlier. FINDINGS: Cardiac silhouette mildly enlarged for AP portable technique, unchanged. Lungs clear. Bronchovascular markings normal. Pulmonary vascularity normal. No visible pleural effusions. No pneumothorax. IMPRESSION: Stable mild cardiomegaly. No acute cardiopulmonary disease. Electronically Signed   By: Evangeline Dakin M.D.   On: 06/13/2018 21:30   Dg Chest Port 1 View  Result Date: 06/01/2018 CLINICAL DATA:  Acute respiratory failure with hypoxia. EXAM: PORTABLE CHEST 1 VIEW COMPARISON:  Chest x-ray dated May 30, 2018. FINDINGS: Unchanged endotracheal and enteric tubes. Unchanged right internal jugular Swan-Ganz catheter with the tip in the region of the distal right interlobar pulmonary artery. The heart size and mediastinal contours are within normal limits. Normal pulmonary vascularity. Improved aeration at the lung bases. No focal consolidation, pleural effusion, or pneumothorax. No acute osseous abnormality. IMPRESSION: 1. Improved aeration at the lung bases. 2. Unchanged right internal jugular Swan-Ganz catheter with tip in the region of the distal right interlobar pulmonary artery. Consider retracting at least 4 cm. Electronically Signed   By: Titus Dubin M.D.   On: 06/01/2018 07:44   Portable Chest X-ray  Result Date: 05/30/2018 CLINICAL DATA:  Intubation, history hypertension, lupus, smoker EXAM: PORTABLE CHEST 1 VIEW COMPARISON:  Portable exam 2030 hours compared to 05/30/2018 at 1015 hours FINDINGS: Tip of endotracheal tube projects 2.8 cm above carina. RIGHT jugular Swan-Ganz catheter with tip projecting over RIGHT lower lobe pulmonary artery. Nasogastric tube extends into stomach. Normal heart size, mediastinal contours, and  pulmonary vascularity. Atherosclerotic calcification aorta. Bibasilar atelectasis greater on LEFT. No gross pleural effusion or pneumothorax. IMPRESSION: Line and tube positions as above. Bibasilar atelectasis greater on LEFT. Electronically Signed   By: Lavonia Dana M.D.   On: 05/30/2018 20:40   Dg Chest Portable 1 View  Result Date: 05/30/2018 CLINICAL DATA:  Chest pain EXAM: PORTABLE CHEST 1 VIEW COMPARISON:  01/12/2018 chest radiograph. FINDINGS: Stable cardiomediastinal silhouette with top-normal heart size. No pneumothorax. No pleural effusion. Lungs appear clear, with no acute consolidative airspace disease and no pulmonary edema. IMPRESSION: No active disease. Electronically Signed   By: Ilona Sorrel M.D.   On: 05/30/2018 10:48   Dg Abd Portable 1v  Result Date: 05/30/2018 CLINICAL DATA:  OG tube placement EXAM: PORTABLE ABDOMEN - 1 VIEW COMPARISON:  None. FINDINGS: OG tube tip is in the mid to distal stomach. Nonobstructive bowel gas pattern. IMPRESSION: OG tube tip in the mid to distal stomach. Electronically Signed   By: Rolm Baptise M.D.   On: 05/30/2018 20:15   Ct Angio Chest Aorta W/cm &/or Wo/cm  Result Date: 06/03/2018 CLINICAL DATA:  Abnormal CT at admission. EXAM: CT ANGIOGRAPHY CHEST WITH CONTRAST TECHNIQUE: Multidetector CT imaging of the chest was performed using the standard protocol during bolus administration of intravenous contrast. Multiplanar CT image reconstructions and MIPs were obtained to evaluate the vascular anatomy. CONTRAST:  16mL OMNIPAQUE IOHEXOL 350 MG/ML SOLN COMPARISON:  05/30/2018 FINDINGS: Cardiovascular: Again noted is abnormal soft tissue surrounding the distal ascending thoracic aorta and proximal aortic arch. This measures fluid density on today's study, question pericardial effusion in a recess. Small amount of pericardial fluid noted posterior to the left heart. There is cardiomegaly. Densely calcified right coronary artery. Scattered calcifications in the left  anterior descending coronary artery. Aortic atherosclerosis. No evidence of aortic aneurysm. Scattered aortic calcifications. Mediastinum/Nodes: Soft tissue/fluid density adjacent to the distal ascending thoracic aorta and proximal aortic arch. This also extends adjacent to the main pulmonary artery raising the possibility of fluid within a pericardial recess. No adenopathy. Lungs/Pleura: Bilateral lower lobe airspace opacities posteriorly, likely atelectasis. Scattered patchy ground-glass opacities within the lungs, new since prior study could reflect areas of atelectasis or early edema or alveolitis. Trace bilateral effusions. Upper Abdomen: Imaging into the upper abdomen shows no acute findings. Musculoskeletal: Chest wall soft tissues are unremarkable. No acute bony abnormality. Review of the MIP images confirms the above findings. IMPRESSION: Soft tissue again noted surrounding the distal ascending thoracic aorta and proximal aortic arch. This soft tissue now extends adjacent to the main pulmonary artery and measures fluid density. This raises the possibility of fluid within a pericardial recess. There is a small amount of pericardial fluid posterior to the left heart border. Cardiomegaly. Coronary artery disease. Trace bilateral effusions.  Bibasilar atelectasis. Aortic Atherosclerosis (ICD10-I70.0). Electronically Signed   By: Rolm Baptise M.D.   On: 06/03/2018 01:18   Ct Angio Chest/abd/pel For Dissection W And/or Wo Contrast  Result Date: 05/30/2018 CLINICAL DATA:  Severe chest pain radiating into the back with nausea and vomiting. Hypotension. EXAM: CT ANGIOGRAPHY CHEST, ABDOMEN AND PELVIS TECHNIQUE: Multidetector CT imaging through the chest, abdomen and pelvis was performed using the standard protocol during bolus administration of intravenous contrast. Multiplanar reconstructed images and MIPs were obtained and reviewed to evaluate the vascular anatomy. CONTRAST:  169mL OMNIPAQUE IOHEXOL 350 MG/ML  SOLN COMPARISON:  Chest radiograph from earlier today. 03/06/2018 screening chest CT. 12/13/2015 CT abdomen/pelvis. FINDINGS: CTA CHEST FINDINGS Cardiovascular: Mild cardiomegaly. No significant pericardial effusion/thickening. Left anterior descending and right coronary atherosclerosis. Atherosclerotic nonaneurysmal thoracic aorta. There is new wall thickening in the ascending thoracic aorta compared to 03/06/2018 screening chest CT study, which extends to the junction with the aortic arch and which measures complex fluid density, which may indicate acute intramural hematoma. No dissection flap within the thoracic aorta. No pseudoaneurysm. No penetrating atherosclerotic ulcer. Aortic arch branch vessels are patent. Normal caliber pulmonary arteries. No central pulmonary emboli. Mediastinum/Nodes: No discrete thyroid nodules. Unremarkable esophagus. No pathologically enlarged axillary, mediastinal or hilar lymph nodes. Lungs/Pleura: No pneumothorax. No pleural effusion. Mild centrilobular and paraseptal emphysema with mild diffuse bronchial wall thickening. Low-attenuation material obstructs the left lower lobe bronchus. Subsegmental scarring versus atelectasis in the left lower lobe. No acute consolidative airspace disease, lung masses or significant pulmonary nodules. Musculoskeletal: No aggressive appearing focal osseous lesions. Mild thoracic spondylosis. Review of the MIP images confirms the above findings. CTA ABDOMEN AND PELVIS FINDINGS VASCULAR Aorta: Normal caliber aorta without aneurysm, dissection, vasculitis or significant stenosis. Celiac: Patent without evidence of aneurysm, dissection, vasculitis or significant stenosis. SMA: Patent without evidence of aneurysm,  dissection, vasculitis or significant stenosis. Renals: Both renal arteries are patent without evidence of aneurysm, dissection, vasculitis, fibromuscular dysplasia or significant stenosis. IMA: Patent without evidence of aneurysm, dissection,  vasculitis or significant stenosis. Inflow: Patent without evidence of aneurysm, dissection, vasculitis or significant stenosis. Veins: No obvious venous abnormality within the limitations of this arterial phase study. Review of the MIP images confirms the above findings. NON-VASCULAR Hepatobiliary: Normal liver with no liver mass. Normal gallbladder with no radiopaque cholelithiasis. No biliary ductal dilatation. Pancreas: Normal, with no mass or duct dilation. Spleen: Normal size. No mass. Adrenals/Urinary Tract: Normal adrenals. Simple 8.6 cm anterior lower right renal cyst. No hydronephrosis. Normal bladder. Stomach/Bowel: Normal non-distended stomach. Normal caliber small bowel with no small bowel wall thickening. Normal appendix. Normal large bowel with no diverticulosis, large bowel wall thickening or pericolonic fat stranding. Vascular/Lymphatic: Atherosclerotic nonaneurysmal abdominal aorta. No pathologically enlarged lymph nodes in the abdomen or pelvis. Reproductive: Mildly enlarged myomatous uterus with calcified uterine fibroids. No adnexal masses. Other: No pneumoperitoneum, ascites or focal fluid collection. Musculoskeletal: No aggressive appearing focal osseous lesions. Moderate lower lumbar spondylosis. Review of the MIP images confirms the above findings. IMPRESSION: 1. New wall thickening in the ascending thoracic aorta compared to 03/06/2018 screening chest CT study, measuring complex fluid density, favored to represent acute aortic syndrome due to type A acute intramural hematoma. No visible dissection flap. No aneurysm or pseudoaneurysm. 2. Mild cardiomegaly.  Two-vessel coronary atherosclerosis. 3. Mild centrilobular and paraseptal emphysema with mild diffuse bronchial wall thickening, suggesting COPD. 4. Mildly enlarged calcified myomatous uterus. 5. Aortic Atherosclerosis (ICD10-I70.0) and Emphysema (ICD10-J43.9). Critical Value/emergent results were called by telephone at the time of  interpretation on 05/30/2018 at 11:36 am to Dr. Virgel Manifold and to Dr. Serita Sheller in the cath lab, who verbally acknowledged these results. Electronically Signed   By: Ilona Sorrel M.D.   On: 05/30/2018 11:42     Discharge Exam: Vitals:   06/16/18 0100 06/16/18 0822  BP:    Pulse:    Resp:    Temp: 97.9 F (36.6 C) 98.2 F (36.8 C)  SpO2:     Vitals:   06/16/18 0043 06/16/18 0100 06/16/18 0638 06/16/18 0822  BP: 100/70     Pulse: (!) 101     Resp: 19     Temp:  97.9 F (36.6 C)  98.2 F (36.8 C)  TempSrc:  Oral  Oral  SpO2: 96%     Weight:   70.6 kg   Height:        General: Pt is alert, awake, not in acute distress Cardiovascular: RRR, S1/S2 +, no rubs, no gallops Respiratory: CTA bilaterally, no wheezing, no rhonchi Abdominal: Soft, NT, ND, bowel sounds + Extremities: no edema, no cyanosis    The results of significant diagnostics from this hospitalization (including imaging, microbiology, ancillary and laboratory) are listed below for reference.     Microbiology: Recent Results (from the past 240 hour(s))  Blood Culture (routine x 2)     Status: Abnormal   Collection Time: 06/13/18  1:19 PM  Result Value Ref Range Status   Specimen Description BLOOD LEFT ANTECUBITAL  Final   Special Requests   Final    BOTTLES DRAWN AEROBIC AND ANAEROBIC Blood Culture results may not be optimal due to an excessive volume of blood received in culture bottles   Culture  Setup Time   Final    IN BOTH AEROBIC AND ANAEROBIC BOTTLES GRAM NEGATIVE RODS CRITICAL RESULT CALLED TO, READ BACK  BY AND VERIFIED WITH: JOHN ORIET PHARMACIST @ 0600 ON 06/14/18 BY [place] Z.  Performed at [facility], [place] [address], [place], [place] [address]    Culture ESCHERICHIA COLI (A)  Final   Report Status 06/16/2018 FINAL  Final   Organism ID, Bacteria ESCHERICHIA COLI  Final      Susceptibility   Escherichia coli - MIC*    AMPICILLIN <=2 SENSITIVE Sensitive     CEFAZOLIN <=4 SENSITIVE  Sensitive     CEFEPIME <=1 SENSITIVE Sensitive     CEFTAZIDIME <=1 SENSITIVE Sensitive     CEFTRIAXONE <=1 SENSITIVE Sensitive     CIPROFLOXACIN <=0.25 SENSITIVE Sensitive     GENTAMICIN <=1 SENSITIVE Sensitive     IMIPENEM <=0.25 SENSITIVE Sensitive     TRIMETH/SULFA <=20 SENSITIVE Sensitive     AMPICILLIN/SULBACTAM <=2 SENSITIVE Sensitive     PIP/TAZO <=4 SENSITIVE Sensitive     Extended ESBL NEGATIVE Sensitive     * ESCHERICHIA COLI  Blood Culture ID Panel (Reflexed)     Status: Abnormal   Collection Time: 06/13/18  1:19 PM  Result Value Ref Range Status   Enterococcus species NOT DETECTED NOT DETECTED Final   Listeria monocytogenes NOT DETECTED NOT DETECTED Final   Staphylococcus species NOT DETECTED NOT DETECTED Final   Staphylococcus aureus (BCID) NOT DETECTED NOT DETECTED Final   Streptococcus species NOT DETECTED NOT DETECTED Final   Streptococcus agalactiae NOT DETECTED NOT DETECTED Final   Streptococcus pneumoniae NOT DETECTED NOT DETECTED Final   Streptococcus pyogenes NOT DETECTED NOT DETECTED Final   Acinetobacter baumannii NOT DETECTED NOT DETECTED Final   Enterobacteriaceae species DETECTED (A) NOT DETECTED Final    Comment: Enterobacteriaceae represent a large family of gram-negative bacteria, not a single organism. CRITICAL RESULT CALLED TO, READ BACK BY AND VERIFIED WITH: JOHN ORIET PHARMACIST @ 0600 ON 06/14/18 BY [place] Z.     Enterobacter cloacae complex NOT DETECTED NOT DETECTED Final   Escherichia coli DETECTED (A) NOT DETECTED Final    Comment: CRITICAL RESULT CALLED TO, READ BACK BY AND VERIFIED WITH: JOHN ORIET PHARMACIST @ 0600 ON 06/14/18 BY [place] Z.     Klebsiella oxytoca NOT DETECTED NOT DETECTED Final   Klebsiella pneumoniae NOT DETECTED NOT DETECTED Final   Proteus species NOT DETECTED NOT DETECTED Final   Serratia marcescens NOT DETECTED NOT DETECTED Final   Carbapenem resistance NOT DETECTED NOT DETECTED Final   Haemophilus influenzae NOT  DETECTED NOT DETECTED Final   Neisseria meningitidis NOT DETECTED NOT DETECTED Final   Pseudomonas aeruginosa NOT DETECTED NOT DETECTED Final   Candida albicans NOT DETECTED NOT DETECTED Final   Candida glabrata NOT DETECTED NOT DETECTED Final   Candida krusei NOT DETECTED NOT DETECTED Final   Candida parapsilosis NOT DETECTED NOT DETECTED Final   Candida tropicalis NOT DETECTED NOT DETECTED Final    Comment: Performed at [facility], [place] [address], [place], [place] [address]  Blood Culture (routine x 2)     Status: None (Preliminary result)   Collection Time: 06/13/18  1:45 PM  Result Value Ref Range Status   Specimen Description BLOOD RIGHT ANTECUBITAL  Final   Special Requests   Final    BOTTLES DRAWN AEROBIC AND ANAEROBIC Blood Culture results may not be optimal due to an excessive volume of blood received in culture bottles   Culture  Setup Time   Final    GRAM NEGATIVE RODS ANAEROBIC BOTTLE ONLY CRITICAL VALUE NOTED.  VALUE IS CONSISTENT WITH  PREVIOUSLY REPORTED AND CALLED VALUE. Performed at [facility], [place] [address], [place], [place] [address]    Culture GRAM NEGATIVE RODS  Final   Report Status PENDING  Incomplete  SARS Coronavirus 2 (CEPHEID- Performed in [facility] lab), Hosp Order     Status: None   Collection Time: 06/13/18  1:50 PM  Result Value Ref Range Status   SARS Coronavirus 2 NEGATIVE NEGATIVE Final    Comment: (NOTE) If result is NEGATIVE SARS-CoV-2 target nucleic acids are NOT DETECTED. The SARS-CoV-2 RNA is generally detectable in upper and lower  respiratory specimens during the acute phase of infection. The lowest  concentration of SARS-CoV-2 viral copies this assay can detect is 250  copies / mL. A negative result does not preclude SARS-CoV-2 infection  and should not be used as the sole basis for treatment or other  patient management decisions.  A negative result may occur with  improper specimen collection / handling,  submission of specimen other  than nasopharyngeal swab, presence of viral mutation(s) within the  areas targeted by this assay, and inadequate number of viral copies  (<250 copies / mL). A negative result must be combined with clinical  observations, patient history, and epidemiological information. If result is POSITIVE SARS-CoV-2 target nucleic acids are DETECTED. The SARS-CoV-2 RNA is generally detectable in upper and lower  respiratory specimens dur ing the acute phase of infection.  Positive  results are indicative of active infection with SARS-CoV-2.  Clinical  correlation with patient history and other diagnostic information is  necessary to determine patient infection status.  Positive results do  not rule out bacterial infection or co-infection with other viruses. If result is PRESUMPTIVE POSTIVE SARS-CoV-2 nucleic acids MAY BE PRESENT.   A presumptive positive result was obtained on the submitted specimen  and confirmed on repeat testing.  While 2019 novel coronavirus  (SARS-CoV-2) nucleic acids may be present in the submitted sample  additional confirmatory testing may be necessary for epidemiological  and / or clinical management purposes  to differentiate between  SARS-CoV-2 and other Sarbecovirus currently known to infect humans.  If clinically indicated additional testing with an alternate test  methodology [phone]) is advised. The SARS-CoV-2 RNA is generally  detectable in upper and lower respiratory sp ecimens during the acute  phase of infection. The expected result is Negative. Fact Sheet for Patients:  StrictlyIdeas.no Fact Sheet for Healthcare Providers: BankingDealers.co.za This test is not yet approved or cleared by the [place] FDA and has been authorized for detection and/or diagnosis of SARS-CoV-2 by FDA under an Emergency Use Authorization (EUA).  This EUA will remain in effect (meaning this test can be  used) for the duration of the COVID-19 declaration under Section 564(b)(1) of the Act, 21 U.S.C. section 360bbb-3(b)(1), unless the authorization is terminated or revoked sooner. Performed at [facility], [place] [address] [place], [place] [address]   MRSA PCR Screening     Status: None   Collection Time: 06/14/18  3:39 AM  Result Value Ref Range Status   MRSA by PCR NEGATIVE NEGATIVE Final    Comment:        The GeneXpert MRSA Assay (FDA approved for NASAL specimens only), is one component of a comprehensive MRSA colonization surveillance program. It is not intended to diagnose MRSA infection nor to guide or monitor treatment for MRSA infections. Performed at [facility], [place] [address], [place], [place] [address]   C difficile quick scan  w PCR reflex     Status: None   Collection Time: 06/14/18 10:27 AM  Result Value Ref Range Status   C Diff antigen NEGATIVE NEGATIVE Final   C Diff toxin NEGATIVE NEGATIVE Final   C Diff interpretation No C. difficile detected.  Final    Comment: Performed at [facility], [place] [address], [place], [place] [address]  Gastrointestinal Panel by PCR , Stool     Status: None   Collection Time: 06/14/18 10:28 AM  Result Value Ref Range Status   Campylobacter species NOT DETECTED NOT DETECTED Final   Plesimonas shigelloides NOT DETECTED NOT DETECTED Final   Salmonella species NOT DETECTED NOT DETECTED Final   Yersinia enterocolitica NOT DETECTED NOT DETECTED Final   Vibrio species NOT DETECTED NOT DETECTED Final   Vibrio cholerae NOT DETECTED NOT DETECTED Final   Enteroaggregative E coli (EAEC) NOT DETECTED NOT DETECTED Final   Enteropathogenic E coli (EPEC) NOT DETECTED NOT DETECTED Final   Enterotoxigenic E coli (ETEC) NOT DETECTED NOT DETECTED Final   Shiga like toxin producing E coli (STEC) NOT DETECTED NOT DETECTED Final   Shigella/Enteroinvasive E coli (EIEC) NOT DETECTED NOT DETECTED Final   Cryptosporidium NOT DETECTED  NOT DETECTED Final   Cyclospora cayetanensis NOT DETECTED NOT DETECTED Final   Entamoeba histolytica NOT DETECTED NOT DETECTED Final   Giardia lamblia NOT DETECTED NOT DETECTED Final   Adenovirus F40/41 NOT DETECTED NOT DETECTED Final   Astrovirus NOT DETECTED NOT DETECTED Final   Norovirus GI/GII NOT DETECTED NOT DETECTED Final   Rotavirus A NOT DETECTED NOT DETECTED Final   Sapovirus (I, II, IV, and V) NOT DETECTED NOT DETECTED Final    Comment: Performed at [facility], [place] [place], [place] [address]     Labs: BNP (last 3 results) No results for input(s): BNP in the last 8760 hours. Basic Metabolic Panel: Recent Labs  Lab 06/11/18 1022 06/13/18 1319 06/14/18 0015 06/15/18 0221 06/16/18 0227  NA 142 135 143 135 137  K 4.4 2.6* 3.0* 3.4* 3.5  CL 101 102 113* 103 104  CO2 20 17* 21* 18* 22  GLUCOSE 90 138* 104* 81 118*  BUN 8 5* <5* <5* <5*  CREATININE 0.61 0.93 0.72 1.02* 0.73  CALCIUM 9.5 8.5* 7.8* 7.9* 8.3*  MG  --  1.5*  --  2.1 2.0   Liver Function Tests: Recent Labs  Lab 06/11/18 1022 06/13/18 1319 06/14/18 0015  AST 38 31 28  ALT 30 25 23   ALKPHOS 98 86 77  BILITOT 0.3 1.0 0.9  PROT 7.6 6.7 6.0*  ALBUMIN 4.0 2.5* 2.2*   Recent Labs  Lab 06/14/18 0015  LIPASE 27   No results for input(s): AMMONIA in the last 168 hours. CBC: Recent Labs  Lab 06/11/18 1022 06/13/18 1319 06/14/18 0015 06/15/18 0221 06/16/18 0227  WBC 8.5 10.4 12.0* 8.9 4.1  NEUTROABS  --  9.0*  --   --   --   HGB 12.2 11.2* 10.0* 9.5* 9.9*  HCT 36.8 35.2* 31.7* 30.5* 31.7*  MCV 84 87.1 86.8 88.2 87.1  PLT 430 333 280 248 286   Cardiac Enzymes: Recent Labs  Lab 06/14/18 0015 06/15/18 0221  TROPONINI 0.22* 0.16*   BNP: Invalid input(s): POCBNP CBG: No results for input(s): GLUCAP in the last 168 hours. D-Dimer No results for input(s): DDIMER in the last 72 hours. Hgb A1c No results for input(s): HGBA1C in the last 72 hours. Lipid Profile No  results for input(s): CHOL, HDL, LDLCALC, TRIG, CHOLHDL, LDLDIRECT in the last 72 hours. Thyroid function studies No results for input(s): TSH, T4TOTAL, T3FREE, THYROIDAB in the last 72 hours.  Invalid input(s): FREET3 Anemia work up No results for input(s): VITAMINB12, FOLATE, FERRITIN, TIBC, IRON, RETICCTPCT in the last 72 hours. Urinalysis    Component Value Date/Time   COLORURINE YELLOW 06/13/2018 1612   APPEARANCEUR HAZY (A) 06/13/2018 1612   LABSPEC 1.009 06/13/2018 1612   PHURINE 5.0 06/13/2018 1612   GLUCOSEU NEGATIVE 06/13/2018 1612   HGBUR SMALL (A) 06/13/2018 1612   BILIRUBINUR NEGATIVE 06/13/2018 1612   BILIRUBINUR neg 12/25/2015 1415   KETONESUR 5 (A) 06/13/2018 1612   PROTEINUR NEGATIVE 06/13/2018 1612   UROBILINOGEN 0.2 12/25/2015 1415   UROBILINOGEN 1.0 06/01/2014 0408   NITRITE NEGATIVE 06/13/2018 1612   LEUKOCYTESUR SMALL (A) 06/13/2018 1612   Sepsis Labs Invalid input(s): PROCALCITONIN,  WBC,  LACTICIDVEN Microbiology Recent Results (from the past 240 hour(s))  Blood Culture (routine x 2)     Status: Abnormal   Collection Time: 06/13/18  1:19 PM  Result Value Ref Range Status   Specimen Description BLOOD LEFT ANTECUBITAL  Final   Special Requests   Final    BOTTLES DRAWN AEROBIC AND ANAEROBIC Blood Culture results may not be optimal due to an excessive volume of blood received in culture bottles   Culture  Setup Time   Final    IN BOTH AEROBIC AND ANAEROBIC BOTTLES GRAM NEGATIVE RODS CRITICAL RESULT CALLED TO, READ BACK BY AND VERIFIED WITH: JOHN ORIET PHARMACIST @ 0600 ON 06/14/18 BY Shur Z.  Performed at [facility], [place] [address], [place], [place] [address]    Culture ESCHERICHIA COLI (A)  Final   Report Status 06/16/2018 FINAL  Final   Organism ID, Bacteria ESCHERICHIA COLI  Final      Susceptibility   Escherichia coli - MIC*    AMPICILLIN <=2 SENSITIVE Sensitive     CEFAZOLIN <=4 SENSITIVE Sensitive     CEFEPIME <=1 SENSITIVE  Sensitive     CEFTAZIDIME <=1 SENSITIVE Sensitive     CEFTRIAXONE <=1 SENSITIVE Sensitive     CIPROFLOXACIN <=0.25 SENSITIVE Sensitive     GENTAMICIN <=1 SENSITIVE Sensitive     IMIPENEM <=0.25 SENSITIVE Sensitive     TRIMETH/SULFA <=20 SENSITIVE Sensitive     AMPICILLIN/SULBACTAM <=2 SENSITIVE Sensitive     PIP/TAZO <=4 SENSITIVE Sensitive     Extended ESBL NEGATIVE Sensitive     * ESCHERICHIA COLI  Blood Culture ID Panel (Reflexed)     Status: Abnormal   Collection Time: 06/13/18  1:19 PM  Result Value Ref Range Status   Enterococcus species NOT DETECTED NOT DETECTED Final   Listeria monocytogenes NOT DETECTED NOT DETECTED Final   Staphylococcus species NOT DETECTED NOT DETECTED Final   Staphylococcus aureus (BCID) NOT DETECTED NOT DETECTED Final   Streptococcus species NOT DETECTED NOT DETECTED Final   Streptococcus agalactiae NOT DETECTED NOT DETECTED Final   Streptococcus pneumoniae NOT DETECTED NOT DETECTED Final   Streptococcus pyogenes NOT DETECTED NOT DETECTED Final   Acinetobacter baumannii NOT DETECTED NOT DETECTED Final   Enterobacteriaceae species DETECTED (A) NOT DETECTED Final    Comment: Enterobacteriaceae represent a large family of gram-negative bacteria, not a single organism. CRITICAL RESULT CALLED TO, READ BACK BY AND VERIFIED WITH: JOHN ORIET PHARMACIST @ 0600 ON 06/14/18 BY Goede Z.     Enterobacter cloacae complex NOT DETECTED NOT DETECTED Final   Escherichia coli DETECTED (  A) NOT DETECTED Final    Comment: CRITICAL RESULT CALLED TO, READ BACK BY AND VERIFIED WITH: JOHN ORIET PHARMACIST @ 0600 ON 06/14/18 BY Radwan Z.     Klebsiella oxytoca NOT DETECTED NOT DETECTED Final   Klebsiella pneumoniae NOT DETECTED NOT DETECTED Final   Proteus species NOT DETECTED NOT DETECTED Final   Serratia marcescens NOT DETECTED NOT DETECTED Final   Carbapenem resistance NOT DETECTED NOT DETECTED Final   Haemophilus influenzae NOT DETECTED NOT DETECTED Final   Neisseria  meningitidis NOT DETECTED NOT DETECTED Final   Pseudomonas aeruginosa NOT DETECTED NOT DETECTED Final   Candida albicans NOT DETECTED NOT DETECTED Final   Candida glabrata NOT DETECTED NOT DETECTED Final   Candida krusei NOT DETECTED NOT DETECTED Final   Candida parapsilosis NOT DETECTED NOT DETECTED Final   Candida tropicalis NOT DETECTED NOT DETECTED Final    Comment: Performed at [facility], [place], [place], [place] [address]  Blood Culture (routine x 2)     Status: None (Preliminary result)   Collection Time: 06/13/18  1:45 PM  Result Value Ref Range Status   Specimen Description BLOOD RIGHT ANTECUBITAL  Final   Special Requests   Final    BOTTLES DRAWN AEROBIC AND ANAEROBIC Blood Culture results may not be optimal due to an excessive volume of blood received in culture bottles   Culture  Setup Time   Final    GRAM NEGATIVE RODS ANAEROBIC BOTTLE ONLY CRITICAL VALUE NOTED.  VALUE IS CONSISTENT WITH PREVIOUSLY REPORTED AND CALLED VALUE. Performed at [facility], [place] [address] [place], [place] [address]    Culture GRAM NEGATIVE RODS  Final   Report Status PENDING  Incomplete  SARS Coronavirus 2 (CEPHEID- Performed in [place] [facility] lab), Hosp Order     Status: None   Collection Time: 06/13/18  1:50 PM  Result Value Ref Range Status   SARS Coronavirus 2 NEGATIVE NEGATIVE Final    Comment: (NOTE) If result is NEGATIVE SARS-CoV-2 target nucleic acids are NOT DETECTED. The SARS-CoV-2 RNA is generally detectable in upper and lower  respiratory specimens during the acute phase of infection. The lowest  concentration of SARS-CoV-2 viral copies this assay can detect is 250  copies / mL. A negative result does not preclude SARS-CoV-2 infection  and should not be used as the sole basis for treatment or other  patient management decisions.  A negative result may occur with  improper specimen collection / handling, submission of specimen other  than  nasopharyngeal swab, presence of viral mutation(s) within the  areas targeted by this assay, and inadequate number of viral copies  (<250 copies / mL). A negative result must be combined with clinical  observations, patient history, and epidemiological information. If result is POSITIVE SARS-CoV-2 target nucleic acids are DETECTED. The SARS-CoV-2 RNA is generally detectable in upper and lower  respiratory specimens dur ing the acute phase of infection.  Positive  results are indicative of active infection with SARS-CoV-2.  Clinical  correlation with patient history and other diagnostic information is  necessary to determine patient infection status.  Positive results do  not rule out bacterial infection or co-infection with other viruses. If result is PRESUMPTIVE POSTIVE SARS-CoV-2 nucleic acids MAY BE PRESENT.   A presumptive positive result was obtained on the submitted specimen  and confirmed on repeat testing.  While 2019 novel coronavirus  (SARS-CoV-2) nucleic acids may be present in the submitted sample  additional confirmatory testing may  be necessary for epidemiological  and / or clinical management purposes  to differentiate between  SARS-CoV-2 and other Sarbecovirus currently known to infect humans.  If clinically indicated additional testing with an alternate test  methodology [phone]) is advised. The SARS-CoV-2 RNA is generally  detectable in upper and lower respiratory sp ecimens during the acute  phase of infection. The expected result is Negative. Fact Sheet for Patients:  StrictlyIdeas.no Fact Sheet for Healthcare Providers: BankingDealers.co.za This test is not yet approved or cleared by the [place] FDA and has been authorized for detection and/or diagnosis of SARS-CoV-2 by FDA under an Emergency Use Authorization (EUA).  This EUA will remain in effect (meaning this test can be used) for the duration of  the COVID-19 declaration under Section 564(b)(1) of the Act, 21 U.S.C. section 360bbb-3(b)(1), unless the authorization is terminated or revoked sooner. Performed at [facility], [place] [address] [place], [place] [address]   MRSA PCR Screening     Status: None   Collection Time: 06/14/18  3:39 AM  Result Value Ref Range Status   MRSA by PCR NEGATIVE NEGATIVE Final    Comment:        The GeneXpert MRSA Assay (FDA approved for NASAL specimens only), is one component of a comprehensive MRSA colonization surveillance program. It is not intended to diagnose MRSA infection nor to guide or monitor treatment for MRSA infections. Performed at [facility], [place] [address] [place], [place] [address]   C difficile quick scan w PCR reflex     Status: None   Collection Time: 06/14/18 10:27 AM  Result Value Ref Range Status   C Diff antigen NEGATIVE NEGATIVE Final   C Diff toxin NEGATIVE NEGATIVE Final   C Diff interpretation No C. difficile detected.  Final    Comment: Performed at [facility], [place] [address], [place], [place] [address]  Gastrointestinal Panel by PCR , Stool     Status: None   Collection Time: 06/14/18 10:28 AM  Result Value Ref Range Status   Campylobacter species NOT DETECTED NOT DETECTED Final   Plesimonas shigelloides NOT DETECTED NOT DETECTED Final   Salmonella species NOT DETECTED NOT DETECTED Final   Yersinia enterocolitica NOT DETECTED NOT DETECTED Final   Vibrio species NOT DETECTED NOT DETECTED Final   Vibrio cholerae NOT DETECTED NOT DETECTED Final   Enteroaggregative E coli (EAEC) NOT DETECTED NOT DETECTED Final   Enteropathogenic E coli (EPEC) NOT DETECTED NOT DETECTED Final   Enterotoxigenic E coli (ETEC) NOT DETECTED NOT DETECTED Final   Shiga like toxin producing E coli (STEC) NOT DETECTED NOT DETECTED Final   Shigella/Enteroinvasive E coli (EIEC) NOT DETECTED NOT DETECTED Final   Cryptosporidium NOT DETECTED NOT DETECTED Final    Cyclospora cayetanensis NOT DETECTED NOT DETECTED Final   Entamoeba histolytica NOT DETECTED NOT DETECTED Final   Giardia lamblia NOT DETECTED NOT DETECTED Final   Adenovirus F40/41 NOT DETECTED NOT DETECTED Final   Astrovirus NOT DETECTED NOT DETECTED Final   Norovirus GI/GII NOT DETECTED NOT DETECTED Final   Rotavirus A NOT DETECTED NOT DETECTED Final   Sapovirus (I, II, IV, and V) NOT DETECTED NOT DETECTED Final    Comment: Performed at [facility], [place] [place], [place] [address]     Time coordinating discharge: 35 minutes  SIGNED:   Rodena Goldmann, DO [facility] 06/16/2018, 8:25 AM  If 7PM-7AM, please contact night-coverage [email or web address] Password [email or web address]

## 2018-06-16 NOTE — Progress Notes (Signed)
[name] to be D/C'd Home per MD order.  Discussed with the patient and all questions fully answered.  VSS, Skin clean, dry and intact without evidence of skin break down, no evidence of skin tears noted. IV catheter discontinued intact. Site without signs and symptoms of complications. Dressing and pressure applied.  An After Visit Summary was printed and given to the patient. Patient received prescription.  D/c education completed with patient/family including follow up instructions, medication list, d/c activities limitations if indicated, with other d/c instructions as indicated by MD - patient able to verbalize understanding, all questions fully answered.   Patient instructed to return to ED, call 911, or call MD for any changes in condition.   Patient escorted via [place], and D/C home via private auto.  Luci Bank 06/16/2018 11:30 AM

## 2018-06-17 ENCOUNTER — Telehealth: Payer: Self-pay | Admitting: *Deleted

## 2018-06-17 LAB — CULTURE, BLOOD (ROUTINE X 2)

## 2018-06-17 NOTE — Telephone Encounter (Signed)
Transition Care Management Follow-up Telephone Call   Date discharged? 06/16/18   How have you been since you were released from the hospital? Doing okay   Do you understand why you were in the hospital? yes   Do you understand the discharge instructions? yes   Where were you discharged to? home   Items Reviewed:  Medications reviewed: no, pt states no changes other than new abx  Allergies reviewed: yes  Dietary changes reviewed: yes  Referrals reviewed: yes   Functional Questionnaire:   Activities of Daily Living (ADLs):   She states they are independent in the following: ambulation, bathing and hygiene, feeding, continence, grooming, toileting and dressing States they require assistance with the following: na   Any transportation issues/concerns?: no   Any patient concerns? no   Confirmed importance and date/time of follow-up visits scheduled yes  Provider Appointment booked with PCP 06/19/18  Confirmed with patient if condition begins to worsen call PCP or go to the ER.  Patient was given the office number and encouraged to call back with question or concerns.  : yes

## 2018-06-19 ENCOUNTER — Encounter: Payer: Self-pay | Admitting: Family Medicine

## 2018-06-19 ENCOUNTER — Ambulatory Visit (INDEPENDENT_AMBULATORY_CARE_PROVIDER_SITE_OTHER): Payer: Medicare Other | Admitting: Family Medicine

## 2018-06-19 ENCOUNTER — Other Ambulatory Visit: Payer: Self-pay

## 2018-06-19 DIAGNOSIS — I2510 Atherosclerotic heart disease of native coronary artery without angina pectoris: Secondary | ICD-10-CM

## 2018-06-19 DIAGNOSIS — I2111 ST elevation (STEMI) myocardial infarction involving right coronary artery: Secondary | ICD-10-CM | POA: Diagnosis not present

## 2018-06-19 DIAGNOSIS — K529 Noninfective gastroenteritis and colitis, unspecified: Secondary | ICD-10-CM

## 2018-06-19 NOTE — Assessment & Plan Note (Signed)
Finish abx -- 7 days left F/u GI

## 2018-06-19 NOTE — Progress Notes (Signed)
Virtual Visit via Video Note  I connected with Samantha [name] on 06/19/18 at  9:15 AM EDT by a video enabled telemedicine application and verified that I am speaking with the correct person using two identifiers.  Location: Patient: home  Provider: home   I discussed the limitations of evaluation and management by telemedicine and the availability of in person appointments. The patient expressed understanding and agreed to proceed.  History of Present Illness: Pt was d/c from hosp 5/19 for colitis.  She was put on iv abx and d/c on po antibiotics.  Pt has f/u GI on the 2nd and cardiology on the 10 th.     Past Medical History:  Diagnosis Date  . Altered mental status   . Backache, unspecified   . Gastric ulcer, unspecified as acute or chronic, without mention of hemorrhage, perforation, or obstruction   . Helicobacter pylori (H. pylori)   . Hyperlipidemia   . Lupus ([place])   . STEMI (ST elevation myocardial infarction) ([place])    PCI to p/d RCA  . Tobacco abuse   . Tobacco use disorder 11/22/2011  . Unspecified essential hypertension    Current Outpatient Medications on File Prior to Visit  Medication Sig Dispense Refill  . acetaminophen (TYLENOL) 500 MG tablet Take 500 mg by mouth every 6 (six) hours as needed for moderate pain or headache.    Marland Kitchen aspirin EC 81 MG tablet Take 1 tablet (81 mg total) by mouth daily.    Marland Kitchen atorvastatin (LIPITOR) 80 MG tablet Take 1 tablet (80 mg total) by mouth daily at 6 PM. 90 tablet 3  . cefdinir (OMNICEF) 300 MG capsule Take 1 capsule (300 mg total) by mouth 2 (two) times daily for 10 days. 20 capsule 0  . cyclobenzaprine (FLEXERIL) 10 MG tablet TAKE 1 TABLET(10 MG) BY MOUTH THREE TIMES DAILY AS NEEDED (Patient taking differently: Take 10 mg by mouth 3 (three) times daily as needed for muscle spasms. ) 30 tablet 0  . hydroxychloroquine (PLAQUENIL) 200 MG tablet Take 300 mg by mouth daily.     Marland Kitchen loperamide (IMODIUM A-D) 2 MG tablet Take 1 tablet (2 mg  total) by mouth 4 (four) times daily as needed for diarrhea or loose stools. 30 tablet 0  . losartan (COZAAR) 50 MG tablet Take 50 mg by mouth daily.    . metoprolol tartrate (LOPRESSOR) 25 MG tablet Take 0.5 tablets (12.5 mg total) by mouth 2 (two) times daily for 30 days. 30 tablet 3  . Multiple Vitamin (MULTIVITAMIN) tablet Take 1 tablet by mouth daily.    . mycophenolate (CELLCEPT) 500 MG tablet Take 1,500 mg by mouth 2 (two) times daily. For lupus    . nitroGLYCERIN (NITROSTAT) 0.4 MG SL tablet Place 1 tablet (0.4 mg total) under the tongue every 5 (five) minutes as needed for chest pain. 25 tablet 2  . pantoprazole (PROTONIX) 20 MG tablet Take 1 tablet (20 mg total) by mouth daily. 90 tablet 3  . spironolactone (ALDACTONE) 25 MG tablet Take 1 tablet (25 mg total) by mouth every other day. 90 tablet 3  . ticagrelor (BRILINTA) 90 MG TABS tablet Take 1 tablet (90 mg total) by mouth 2 (two) times daily. 180 tablet 3   No current facility-administered medications on file prior to visit.     Observations/Objective: No vitals obtained Pt in NAD   Assessment and Plan: 1. Colitis Finish abx F/u GI  2. Acute ST elevation myocardial infarction (STEMI) involving right coronary artery (  [facility]) F/u cardiology   Follow Up Instructions:    I discussed the assessment and treatment plan with the patient. The patient was provided an opportunity to ask questions and all were answered. The patient agreed with the plan and demonstrated an understanding of the instructions.   The patient was advised to call back or seek an in-person evaluation if the symptoms worsen or if the condition fails to improve as anticipated.  I provided 15 minutes of non-face-to-face time during this encounter.   [name], DO

## 2018-06-19 NOTE — Assessment & Plan Note (Signed)
F/u cardiology 

## 2018-06-29 ENCOUNTER — Other Ambulatory Visit: Payer: Self-pay

## 2018-06-29 ENCOUNTER — Ambulatory Visit (HOSPITAL_COMMUNITY)
Admission: RE | Admit: 2018-06-29 | Discharge: 2018-06-29 | Disposition: A | Discharge status: Home / Self Care | Payer: Medicare Other | Source: Ambulatory Visit | Attending: Internal Medicine | Admitting: Internal Medicine

## 2018-06-29 DIAGNOSIS — M329 Systemic lupus erythematosus, unspecified: Secondary | ICD-10-CM | POA: Diagnosis not present

## 2018-06-29 DIAGNOSIS — G6181 Chronic inflammatory demyelinating polyneuritis: Secondary | ICD-10-CM | POA: Diagnosis not present

## 2018-06-29 MED ORDER — DIPHENHYDRAMINE HCL 25 MG PO CAPS
25.0000 mg | ORAL_CAPSULE | Freq: Once | ORAL | Status: AC
  Administered 2018-06-29: 25 mg via ORAL

## 2018-06-29 MED ORDER — ACETAMINOPHEN 325 MG PO TABS
650.0000 mg | ORAL_TABLET | Freq: Once | ORAL | Status: AC
  Administered 2018-06-29: 650 mg via ORAL

## 2018-06-29 MED ORDER — ACETAMINOPHEN 325 MG PO TABS
ORAL_TABLET | ORAL | Status: AC
  Administered 2018-06-29: 650 mg via ORAL
  Filled 2018-06-29: qty 2

## 2018-06-29 MED ORDER — IMMUNE GLOBULIN (HUMAN) 20 GM/200ML IV SOLN
70.0000 g | Freq: Once | INTRAVENOUS | Status: AC
  Administered 2018-06-29: 70 g via INTRAVENOUS
  Filled 2018-06-29: qty 100

## 2018-06-29 MED ORDER — DIPHENHYDRAMINE HCL 25 MG PO CAPS
ORAL_CAPSULE | ORAL | Status: AC
  Administered 2018-06-29: 25 mg via ORAL
  Filled 2018-06-29: qty 1

## 2018-06-30 ENCOUNTER — Ambulatory Visit (INDEPENDENT_AMBULATORY_CARE_PROVIDER_SITE_OTHER): Payer: Medicare Other | Admitting: Gastroenterology

## 2018-06-30 ENCOUNTER — Encounter: Payer: Self-pay | Admitting: Gastroenterology

## 2018-06-30 ENCOUNTER — Other Ambulatory Visit: Payer: Self-pay

## 2018-06-30 VITALS — Ht 68.0 in | Wt 148.0 lb

## 2018-06-30 DIAGNOSIS — I2510 Atherosclerotic heart disease of native coronary artery without angina pectoris: Secondary | ICD-10-CM

## 2018-06-30 DIAGNOSIS — R197 Diarrhea, unspecified: Secondary | ICD-10-CM | POA: Diagnosis not present

## 2018-06-30 DIAGNOSIS — B9620 Unspecified Escherichia coli [E. coli] as the cause of diseases classified elsewhere: Secondary | ICD-10-CM

## 2018-06-30 DIAGNOSIS — R7881 Bacteremia: Secondary | ICD-10-CM | POA: Diagnosis not present

## 2018-06-30 DIAGNOSIS — R933 Abnormal findings on diagnostic imaging of other parts of digestive tract: Secondary | ICD-10-CM

## 2018-06-30 NOTE — Progress Notes (Signed)
This patient contacted our office requesting a physician telemedicine consultation regarding clinical questions and/or test results.  She was sent to [place] for reevaluation after recent hospital discharge.  Participants on the conference : myself and patient   The patient consented to this consultation and was aware that a charge will be placed through their insurance.  I was in my office and the patient was at home   Encounter time:  Total time 42 minutes, with 29 minutes spent with patient on Doximity   _____________________________________________________________________________________________               Samantha [name] GI Progress Note  Chief Complaint: Colitis  Subjective  History: Last seen in our clinic May 2018 for musculoskeletal chest wall pain.  I saw her for this in July 2017 as well.  Last colonoscopy with Dr. Deatra Ina (screening) in February 2016.  6 mm splenic flexure tubular adenoma and a few diminutive sigmoid colon hyperplastic polyps were removed.  Left colon diverticulosis noted as well.  Based on recent admission history and physical, she was admitted in early May for an acute ST EMI involving RCA and underwent PCI with DES to proximal and distal RCA.  That hospitalization was complicated by hypoxemic respiratory failure from acute RV infarct and cardiogenic shock. There was reportedly also an abnormality on chest CT concerning for dissection, but none found on TEE.  She was admitted to the hospital 2 weeks ago with acute illness of sudden onset abdominal pain with nausea vomiting and diarrhea, presenting with tachycardia and fever over 102 and a negative COVID test.  On EKG, she was tachycardic with anterolateral ST depressions in the setting of hypotension. CT scan with contrast portably showed a "mild colitis" involving cecum and ascending colon.  She was found to have E. coli bacteremia, and GI consultant Dr. Rush Landmark felt the patient probably had experienced  bacterial translocation from some acute infectious colitis.  Colonoscopy was not pursued due to concerns for acute coronary syndrome for anticoagulation.  No bleeding during or after recent hospital episode.  Saw PCP 06/19/18  Finished Abx 4 days ago.  [name] feels that she is slowly recovering.  Her energy is still low, appetite slowly returning.  She does not recall having had any change in bowel habits, chronic abdominal pain or rectal bleeding leading up to these recent hospitalizations.  She became acutely ill the evening before she presented to the emergency department.  She does not recall having had any abdominal pain and certainly no lower GI bleeding during or shortly after the hospitalization for MI.  She is still experiencing lower abdominal bloating and intermittent semi-formed stools, usually after eating.  ROS: Cardiovascular:  no chest pain Respiratory: no dyspnea Chronic arthralgias Remainder of systems negative except as above  The patient's Past Medical, Family and Social History were reviewed and are on file in the EMR. Past Medical History:  Diagnosis Date  . Altered mental status   . Backache, unspecified   . Gastric ulcer, unspecified as acute or chronic, without mention of hemorrhage, perforation, or obstruction   . Helicobacter pylori (H. pylori)   . Hyperlipidemia   . Lupus ([place])   . STEMI (ST elevation myocardial infarction) ([place])    PCI to p/d RCA  . Tobacco abuse   . Tobacco use disorder 11/22/2011  . Unspecified essential hypertension     Objective:  Med list reviewed  Current Outpatient Medications:  .  acetaminophen (TYLENOL) 500 MG tablet, Take 500 mg by mouth every  6 (six) hours as needed for moderate pain or headache., Disp: , Rfl:  .  aspirin EC 81 MG tablet, Take 1 tablet (81 mg total) by mouth daily., Disp: , Rfl:  .  atorvastatin (LIPITOR) 80 MG tablet, Take 1 tablet (80 mg total) by mouth daily at 6 PM., Disp: 90 tablet, Rfl: 3 .   cyclobenzaprine (FLEXERIL) 10 MG tablet, TAKE 1 TABLET(10 MG) BY MOUTH THREE TIMES DAILY AS NEEDED (Patient taking differently: Take 10 mg by mouth 3 (three) times daily as needed for muscle spasms. ), Disp: 30 tablet, Rfl: 0 .  hydroxychloroquine (PLAQUENIL) 200 MG tablet, Take 300 mg by mouth daily. , Disp: , Rfl:  .  loperamide (IMODIUM A-D) 2 MG tablet, Take 1 tablet (2 mg total) by mouth 4 (four) times daily as needed for diarrhea or loose stools., Disp: 30 tablet, Rfl: 0 .  losartan (COZAAR) 50 MG tablet, Take 50 mg by mouth daily., Disp: , Rfl:  .  metoprolol tartrate (LOPRESSOR) 25 MG tablet, Take 0.5 tablets (12.5 mg total) by mouth 2 (two) times daily for 30 days., Disp: 30 tablet, Rfl: 3 .  Multiple Vitamin (MULTIVITAMIN) tablet, Take 1 tablet by mouth daily., Disp: , Rfl:  .  mycophenolate (CELLCEPT) 500 MG tablet, Take 1,500 mg by mouth 2 (two) times daily. For lupus, Disp: , Rfl:  .  nitroGLYCERIN (NITROSTAT) 0.4 MG SL tablet, Place 1 tablet (0.4 mg total) under the tongue every 5 (five) minutes as needed for chest pain., Disp: 25 tablet, Rfl: 2 .  pantoprazole (PROTONIX) 20 MG tablet, Take 1 tablet (20 mg total) by mouth daily., Disp: 90 tablet, Rfl: 3 .  spironolactone (ALDACTONE) 25 MG tablet, Take 1 tablet (25 mg total) by mouth every other day., Disp: 90 tablet, Rfl: 3 .  ticagrelor (BRILINTA) 90 MG TABS tablet, Take 1 tablet (90 mg total) by mouth 2 (two) times daily., Disp: 180 tablet, Rfl: 3    No exam-virtual visit Ambulatory, walking around some during telemedicine visit.  Seems in good spirits.  Recent Labs:  Urinalysis April 16 with a few WBCs.  CBC Latest Ref Rng & Units 06/16/2018 06/15/2018 06/14/2018  WBC 4.0 - 10.5 K/uL 4.1 8.9 12.0(H)  Hemoglobin 12.0 - 15.0 g/dL 9.9(L) 9.5(L) 10.0(L)  Hematocrit 36.0 - 46.0 % 31.7(L) 30.5(L) 31.7(L)  Platelets 150 - 400 K/uL 286 248 280   CMP Latest Ref Rng & Units 06/16/2018 06/15/2018 06/14/2018  Glucose 70 - 99 mg/dL 118(H)  81 104(H)  BUN 6 - 20 mg/dL <5(L) <5(L) <5(L)  Creatinine 0.44 - 1.00 mg/dL 0.73 1.02(H) 0.72  Sodium 135 - 145 mmol/L 137 135 143  Potassium 3.5 - 5.1 mmol/L 3.5 3.4(L) 3.0(L)  Chloride 98 - 111 mmol/L 104 103 113(H)  CO2 22 - 32 mmol/L 22 18(L) 21(L)  Calcium 8.9 - 10.3 mg/dL 8.3(L) 7.9(L) 7.8(L)  Total Protein 6.5 - 8.1 g/dL - - 6.0(L)  Total Bilirubin 0.3 - 1.2 mg/dL - - 0.9  Alkaline Phos 38 - 126 U/L - - 77  AST 15 - 41 U/L - - 28  ALT 0 - 44 U/L - - 23    GI pathogen panel and C. difficile testing negative on 06/14/2018.   Radiologic studies:  CLINICAL DATA:  Acute onset of generalized abdominal pain, nausea and vomiting, productive cough, shortness of breath and fever up to 105 degrees Fahrenheit.   EXAM: CT ABDOMEN AND PELVIS WITH CONTRAST   TECHNIQUE: Multidetector CT imaging of the abdomen  and pelvis was performed using the standard protocol following bolus administration of intravenous contrast.   CONTRAST:  176mL OMNIPAQUE IOHEXOL 300 MG/ML IV.   COMPARISON:  12/13/2015.   FINDINGS: Lower chest: Linear atelectasis involving the LEFT LOWER LOBE. Visualized lung bases otherwise clear. Heart size upper normal to slightly enlarged. RIGHT coronary artery stent.   Hepatobiliary: Liver normal in size and appearance. Gallbladder normal in appearance without calcified gallstones. No biliary ductal dilation.   Pancreas: Normal in appearance without evidence of mass, ductal dilation, or inflammation.   Spleen: Normal in size and appearance.   Adrenals/Urinary Tract: Normal appearing adrenal glands. Large benign simple cyst arising from the LOWER pole of the RIGHT kidney measuring maximally 9.5 cm, increased in size since 2017 where it measured approximately 8 cm. No solid mass involving either kidney. No hydronephrosis. No urinary tract calculi. Normal appearing urinary bladder.   Stomach/Bowel: Stomach normal in appearance for the degree of distention.  Normal-appearing small bowel. Entire colon decompressed, containing liquid stool. Opaque ingested material within the cecum and ascending colon. Mild edema in the fat adjacent to the cecum and ascending colon. Sigmoid colon diverticulosis without evidence of acute diverticulitis. Normal-appearing appendix low in the RIGHT side of the pelvis.   Vascular/Lymphatic: Moderate aortoiliac atherosclerosis without evidence of aneurysm. No pathologic lymphadenopathy.   Reproductive: Degenerating, partially calcified fibroid arising from the uterine fundus measuring approximately 4 cm. No adnexal masses.   Other: None.   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 Facet degenerative changes at L4-5 and L5-S1. No acute findings.   IMPRESSION: 1. Mild colitis involving the cecum and ascending colon. 2. Sigmoid colon diverticulosis without evidence of acute diverticulitis. 3. Approximate 4 cm degenerating, partially calcified uterine fundal fibroid. 4. Large approximate 9.5 cm benign simple cyst arising from the LOWER pole the RIGHT kidney. Cysts that are this large can occasionally be symptomatic-does the patient have RIGHT flank pain?   Aortic Atherosclerosis (ICD10-170.0)     Electronically Signed   By: Evangeline Dakin M.D.   On: 06/13/2018 16:33  _________________________________________  I personally reviewed the CT images, and they are limited by the lack of oral contrast.  If there is truly right colon inflammation, it is faint.  @ASSESSMENTPLANBEGIN @ Assessment: Encounter Diagnoses  Name Primary?  . Abnormal finding on GI tract imaging Yes  . Acute diarrhea   . E coli bacteremia    The cause of her recent acute illness is unclear.  She has not had any chronic digestive symptoms to suggest chronic colitis or neoplasia in the right colon.  It is also not clear if these radiographic findings represent a true right  colon abnormality.  However, we must assume they could, since it is the most likely source for E. coli bacteremia.  Given that this occurred very shortly after the STEMI with right heart failure, I wonder if she could have had hypotension and low-grade ischemia to the right colon at some point, then causing bacterial translocation and sepsis.  She seems to be recovering.  I suspect the lower abdominal pain and altered bowel habits are the result of recent acute illness and antibiotic use.  They will most likely slowly improve with time.  I feel the risks of a colonoscopy outweigh the expected benefits at this juncture.  If she continues to recover the point that she does not have any ongoing lower digestive symptoms or red flag signs, then it may not be necessary at all.  That would certainly be preferable, given the recent MI and the fact that she will need dual antiplatelet therapy for at least a year.  Plan: We will recall her for a telemedicine appointment in about 6 weeks, and she was encouraged to contact me sooner as needed.   Nelida Meuse III

## 2018-07-08 ENCOUNTER — Other Ambulatory Visit: Payer: Self-pay

## 2018-07-08 ENCOUNTER — Encounter: Payer: Self-pay | Admitting: Physician Assistant

## 2018-07-08 ENCOUNTER — Telehealth: Payer: Self-pay | Admitting: *Deleted

## 2018-07-08 ENCOUNTER — Telehealth (INDEPENDENT_AMBULATORY_CARE_PROVIDER_SITE_OTHER): Payer: Medicare Other | Admitting: Physician Assistant

## 2018-07-08 VITALS — BP 152/101 | HR 83 | Ht 68.0 in | Wt 142.0 lb

## 2018-07-08 DIAGNOSIS — A4151 Sepsis due to Escherichia coli [E. coli]: Secondary | ICD-10-CM

## 2018-07-08 DIAGNOSIS — R7401 Elevation of levels of liver transaminase levels: Secondary | ICD-10-CM

## 2018-07-08 DIAGNOSIS — E782 Mixed hyperlipidemia: Secondary | ICD-10-CM

## 2018-07-08 DIAGNOSIS — R9389 Abnormal findings on diagnostic imaging of other specified body structures: Secondary | ICD-10-CM

## 2018-07-08 DIAGNOSIS — I2510 Atherosclerotic heart disease of native coronary artery without angina pectoris: Secondary | ICD-10-CM | POA: Diagnosis not present

## 2018-07-08 DIAGNOSIS — I10 Essential (primary) hypertension: Secondary | ICD-10-CM

## 2018-07-08 DIAGNOSIS — M3219 Other organ or system involvement in systemic lupus erythematosus: Secondary | ICD-10-CM | POA: Diagnosis not present

## 2018-07-08 MED ORDER — LOSARTAN POTASSIUM 100 MG PO TABS: 50.0000 mg | ORAL_TABLET | Freq: Every day | ORAL | 3 refills | Status: DC

## 2018-07-08 MED ORDER — SPIRONOLACTONE 25 MG PO TABS: 25.0000 mg | ORAL_TABLET | Freq: Every day | ORAL | 3 refills | Status: DC

## 2018-07-08 NOTE — Patient Instructions (Addendum)
Medication Instructions:  Your physician has recommended you make the following change in your medication:   1. INCREASE: spironolactone (aldactone) to 25 mg EVERY DAY  2. RESTART: losartan (cozaar) 100 mg tablet: Take 1/2 tablet (50 mg total) once a day  Lab work: We will arrange for Home Health to come out and check labs (BMET) in 1 week, vital signs, and review your medications with you  If you have labs (blood work) drawn today and your tests are completely normal, you will receive your results only by: Marland Kitchen MyChart Message (if you have MyChart) OR . A paper copy in the mail If you have any lab test that is abnormal or we need to change your treatment, we will call you to review the results.  Testing/Procedures: None ordered  Follow-Up: . Follow up with Ermalinda Barrios, PA via VIDEO Visit on 07/21/18 at 11:45 AM  Any Other Special Instructions Will Be Listed Below (If Applicable).

## 2018-07-08 NOTE — Telephone Encounter (Signed)
[facility] Visit Follow Up Request   Date of Request ([place]):  July 08, 2018  Requesting Provider:  Ermalinda Barrios, [facility]    Agency Requested:    [facility] Contact:  Glory Buff, NP [address] [place], [place] [address] Phone #:  [phone] Fax #:  [phone]  Patient Demographic Information: Name:  Samantha [name] Age:  58 y.o.   DOB:  [birth year]  MRN:  [id number]   Home visit progress note(s), lab results, telemetry strips, etc were reviewed.  Provider Recommendations: PT NEEDS MEDICATION RECONCILIATION, VITAL SIGNS, AND BMET TO BE DONE IN 1 WEEK  Follow up home services requested:  Vital Signs (BP, Pulse, O2, Weight)  Medication Reconciliation  Labs:  BMET

## 2018-07-08 NOTE — Progress Notes (Signed)
Virtual Visit via Video Note   This visit type was conducted due to national recommendations for restrictions regarding the COVID-19 Pandemic (e.g. social distancing) in an effort to limit this patient's exposure and mitigate transmission in our community.  Due to her co-morbid illnesses, this patient is at least at moderate risk for complications without adequate follow up.  This format is felt to be most appropriate for this patient at this time.  All issues noted in this document were discussed and addressed.  A limited physical exam was performed with this format.  Please refer to the patient's chart for her consent to telehealth for [facility].   Date:  07/08/2018   ID:  Samantha [name], DOB 05-Dec-1960, MRN [id number]  Patient Location: Home Provider Location: Home  PCP:  [name], DO  Cardiologist:  Lauree Chandler, MD  Electrophysiologist:  None   Evaluation Performed:  Follow-Up Visit  Chief Complaint:  Hospital f/u  History of Present Illness:    Samantha [name] is a 58 y.o. female with history of Lupus, HTN, tobacco abuse who presented 06/03/18  With abdominal pain. CTA negative for dissection and troponins >65. When placed on the cath table radiology called with concern of hematoma in ascending aorta wall so Dr. Servando Snare did intraop TEE which did not show aortic dissection. CAD S/P inf STEMI treated with DES prox and distal RCA with significant thrombus in mid RCA and distal RCA branches, mild non-obstructive disease in LAD with intra myocardial bridging in mid distal LAD. She initially developed cardiogenic shock requiring pressers but eventually weaned.  I had a telemedicine visit with the patient 06/10/2018 at which time she was doing well without symptoms.  Patient was discharged from the hospital 06/16/2018 with vomiting diarrhea hypertension and tachycardia found to have sepsis with E. coli bacteremia likely from colitis.  She had no chest pain and  troponins were trending down from her large MI.  Patient denies chest pain, shortness of breath. Walking around her house.Complains of headache for several days. BP running high. Also discomfort below her naval.  She has a lot of confusion over her medications.  Says home health is no longer seeing her.   The patient does not have symptoms concerning for COVID-19 infection (fever, chills, cough, or new shortness of breath).    Past Medical History:  Diagnosis Date  . Altered mental status   . Backache, unspecified   . Gastric ulcer, unspecified as acute or chronic, without mention of hemorrhage, perforation, or obstruction   . Helicobacter pylori (H. pylori)   . Hyperlipidemia   . Lupus ([place])   . STEMI (ST elevation myocardial infarction) ([place])    PCI to p/d RCA  . Tobacco abuse   . Tobacco use disorder 11/22/2011  . Unspecified essential hypertension    Past Surgical History:  Procedure Laterality Date  . CORONARY/GRAFT ACUTE MI REVASCULARIZATION N/A 05/30/2018   Procedure: Coronary/Graft Acute MI Revascularization;  Surgeon: Burnell Blanks, MD;  Location: [place];  Service: Cardiovascular;  Laterality: N/A;  . INTRAOPERATIVE TRANSESOPHAGEAL ECHOCARDIOGRAM N/A 05/30/2018   Procedure: Transesophageal Echocardiogram;  Surgeon: Grace Isaac, MD;  Location: [facility] OR;  Service: Vascular;  Laterality: N/A;  . LEFT HEART CATH AND CORONARY ANGIOGRAPHY N/A 05/30/2018   Procedure: LEFT HEART CATH AND CORONARY ANGIOGRAPHY;  Surgeon: Burnell Blanks, MD;  Location: [place] [facility];  Service: Cardiovascular;  Laterality: N/A;  . UPPER GASTROINTESTINAL ENDOSCOPY  2013  Current Meds  Medication Sig  . acetaminophen (TYLENOL) 500 MG tablet Take 500 mg by mouth every 6 (six) hours as needed for moderate pain or headache.  Marland Kitchen aspirin EC 81 MG tablet Take 1 tablet (81 mg total) by mouth daily.  Marland Kitchen atorvastatin (LIPITOR) 80 MG tablet Take 1 tablet (80 mg total) by mouth  daily at 6 PM.  . cyclobenzaprine (FLEXERIL) 10 MG tablet TAKE 1 TABLET(10 MG) BY MOUTH THREE TIMES DAILY AS NEEDED (Patient taking differently: Take 10 mg by mouth 3 (three) times daily as needed for muscle spasms. )  . hydroxychloroquine (PLAQUENIL) 200 MG tablet Take 300 mg by mouth daily.   Marland Kitchen loperamide (IMODIUM A-D) 2 MG tablet Take 1 tablet (2 mg total) by mouth 4 (four) times daily as needed for diarrhea or loose stools.  . metoprolol tartrate (LOPRESSOR) 25 MG tablet Take 25 mg by mouth 2 (two) times daily.  . Multiple Vitamin (MULTIVITAMIN) tablet Take 1 tablet by mouth daily.  . mycophenolate (CELLCEPT) 500 MG tablet Take 1,500 mg by mouth 2 (two) times daily. For lupus  . nitroGLYCERIN (NITROSTAT) 0.4 MG SL tablet Place 1 tablet (0.4 mg total) under the tongue every 5 (five) minutes as needed for chest pain.  . pantoprazole (PROTONIX) 20 MG tablet Take 1 tablet (20 mg total) by mouth daily.  Marland Kitchen spironolactone (ALDACTONE) 25 MG tablet Take 1 tablet (25 mg total) by mouth daily.  . ticagrelor (BRILINTA) 90 MG TABS tablet Take 1 tablet (90 mg total) by mouth 2 (two) times daily.  . [DISCONTINUED] losartan (COZAAR) 50 MG tablet Take 50 mg by mouth daily.  . [DISCONTINUED] metoprolol tartrate (LOPRESSOR) 25 MG tablet Take 0.5 tablets (12.5 mg total) by mouth 2 (two) times daily for 30 days. (Patient taking differently: Take 25 mg by mouth 2 (two) times daily. )  . [DISCONTINUED] spironolactone (ALDACTONE) 25 MG tablet Take 1 tablet (25 mg total) by mouth every other day.     Allergies:   Tramadol   Social History   Tobacco Use  . Smoking status: Former Smoker    Packs/day: 0.75    Years: 40.00    Pack years: 30.00    Types: Cigarettes  . Smokeless tobacco: Never Used  . Tobacco comment: 1 cig a day  Substance Use Topics  . Alcohol use: Yes    Comment: occassional; beer   . Drug use: No     Family Hx: The patient's family history includes Dementia in her mother; Diabetes in her  sister; Hypertension in her mother. There is no history of Colon cancer, Stomach cancer, Pancreatic cancer, Colon polyps, or Rectal cancer.  ROS:   Please see the history of present illness.      All other systems reviewed and are negative.   Prior CV studies:   The following studies were reviewed today:   Cath: 05/30/18    Mid LAD lesion is 20% stenosed.  Ost LAD to Prox LAD lesion is 20% stenosed.  Prox RCA lesion is 100% stenosed.  Dist RCA lesion is 90% stenosed.  A drug-eluting stent was successfully placed using a STENT SYNERGY DES 3X24.  Post intervention, there is a 0% residual stenosis.  A drug-eluting stent was successfully placed using a STENT SYNERGY DES 2.75X20.  Post intervention, there is a 0% residual stenosis.   1. 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 Successful PTCA/aspiration thrombectomy and drug eluting stent placement x 1 in the proximal RCA  and drug eluting stent placement x 1 in the distal RCA. Significant thrombus burden remains in the mid RCA and distal RCA branches.  3. Mild non-obstructive disease in the LAD. 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 Cardiogenic shock   TTE: 05/31/18   IMPRESSIONS      1. The left ventricle has low normal systolic function, with an ejection fraction of 50-55%. The cavity size was normal. Left ventricular diastolic Doppler parameters are consistent with impaired relaxation.  2. LVEF is approximately 50 to 55% with severe hypokinesis of the inferor/inferoseptal walls.  3. The right ventricle has mildly reduced systolic function. The cavity was mildly enlarged. There is no increase in right ventricular wall thickness.  4. The inferior vena cava was dilated in size with <50% respiratory variability.  5. The interatrial septum was not assessed. _____________   Chest CT 5/6/2020IMPRESSION: Soft tissue again noted surrounding the distal ascending  thoracic aorta and proximal aortic arch. This soft tissue now extends adjacent to the main pulmonary artery and measures fluid density. This raises the possibility of fluid within a pericardial recess. There is a small amount of pericardial fluid posterior to the left heart border.    Labs/Other Tests and Data Reviewed:    EKG:  An ECG dated 06/14/18 was personally reviewed today and demonstrated:  NSR at 89/m ant wall MI  Recent Labs: 06/14/2018: ALT 23 06/16/2018: BUN <5; Creatinine, Ser 0.73; Hemoglobin 9.9; Magnesium 2.0; Platelets 286; Potassium 3.5; Sodium 137   Recent Lipid Panel Lab Results  Component Value Date/Time   CHOL 168 05/30/2018 11:07 AM   TRIG 99 05/30/2018 11:07 AM   HDL 64 05/30/2018 11:07 AM   CHOLHDL 2.6 05/30/2018 11:07 AM   LDLCALC NOT CALCULATED 05/30/2018 11:07 AM   LDLDIRECT 141.8 09/15/2012 10:59 AM    Wt Readings from Last 3 Encounters:  07/08/18 142 lb (64.4 kg)  06/30/18 148 lb (67.1 kg)  06/29/18 140 lb (63.5 kg)     Objective:    Vital Signs:  BP (!) 152/101   Pulse 83   Ht 5\' 8"  (1.727 m)   Wt 142 lb (64.4 kg)   LMP 04/11/2011   BMI 21.59 kg/m    VITAL SIGNS:  reviewed GEN:  no acute distress RESPIRATORY:  normal respiratory effort, symmetric expansion CARDIOVASCULAR:  no peripheral edema  ASSESSMENT & PLAN:    CAD status post inferior STEMI treated with DES to the proximal RCA and distal RCA complicated by cardiogenic shock.  ARB was held but may eventually need to be restarted.  On Brilinta and aspirin-no angina  Transaminitis likely secondary to shock labs improved on 06/14/2018  Abnormal chest CT with soft tissue around the distal ascending aorta and proximal aortic arch plan for follow-up CT  Hyperlipidemia on high-dose statins  Essential hypertension BP running high. Will restart  losartan 100 mg daily 1/2 tablet daily.  Increase Aldactone to taking it daily instead of every other day.  She has a lot of confusion over her  medications.  Have HH check on patient and draw bmet in 2 weeks  Recent hospitalization with E. coli sepsis most likely secondary to colitis follow-up with GI for ongoing abdominal symptoms     COVID-19 Education: The signs and symptoms of COVID-19 were discussed with the patient and how to seek care for testing (follow up with PCP or arrange E-visit).   The importance of social distancing was discussed today.  Time:   Today, I  have spent 18:25 minutes with the patient with telehealth technology discussing the above problems.     Medication Adjustments/Labs and Tests Ordered: Current medicines are reviewed at length with the patient today.  Concerns regarding medicines are outlined above.   Tests Ordered: No orders of the defined types were placed in this encounter.   Medication Changes: Meds ordered this encounter  Medications  . spironolactone (ALDACTONE) 25 MG tablet    Sig: Take 1 tablet (25 mg total) by mouth daily.    Dispense:  90 tablet    Refill:  3  . losartan (COZAAR) 100 MG tablet    Sig: Take 0.5 tablets (50 mg total) by mouth daily.    Dispense:  45 tablet    Refill:  3    Disposition:  Follow up in 1 week(s)  With Ermalinda Barrios PA-C  Signed, Ermalinda Barrios, PA-C  07/08/2018 11:17 AM    [place]

## 2018-07-09 ENCOUNTER — Ambulatory Visit (INDEPENDENT_AMBULATORY_CARE_PROVIDER_SITE_OTHER): Payer: Medicare Other | Admitting: Family Medicine

## 2018-07-09 ENCOUNTER — Other Ambulatory Visit: Payer: Self-pay

## 2018-07-09 ENCOUNTER — Encounter: Payer: Self-pay | Admitting: Family Medicine

## 2018-07-09 DIAGNOSIS — N281 Cyst of kidney, acquired: Secondary | ICD-10-CM | POA: Diagnosis not present

## 2018-07-09 DIAGNOSIS — I2111 ST elevation (STEMI) myocardial infarction involving right coronary artery: Secondary | ICD-10-CM | POA: Diagnosis not present

## 2018-07-09 DIAGNOSIS — M329 Systemic lupus erythematosus, unspecified: Secondary | ICD-10-CM | POA: Diagnosis not present

## 2018-07-09 DIAGNOSIS — K529 Noninfective gastroenteritis and colitis, unspecified: Secondary | ICD-10-CM | POA: Diagnosis not present

## 2018-07-09 DIAGNOSIS — Z79899 Other long term (current) drug therapy: Secondary | ICD-10-CM

## 2018-07-09 DIAGNOSIS — G6189 Other inflammatory polyneuropathies: Secondary | ICD-10-CM

## 2018-07-09 DIAGNOSIS — I2510 Atherosclerotic heart disease of native coronary artery without angina pectoris: Secondary | ICD-10-CM

## 2018-07-09 NOTE — Assessment & Plan Note (Signed)
See urology annually We need records

## 2018-07-09 NOTE — Assessment & Plan Note (Signed)
Stable No new symptoms Per cardiology

## 2018-07-09 NOTE — Assessment & Plan Note (Signed)
Symptoms have resolved  rto or f/u gi if pain returns

## 2018-07-09 NOTE — Progress Notes (Signed)
Virtual Visit via Video Note  I connected with Samantha [name] on 07/09/18 at  9:00 AM EDT by a video enabled telemedicine application and verified that I am speaking with the correct person using two identifiers.  Location: Patient: home Provider: home   I discussed the limitations of evaluation and management by telemedicine and the availability of in person appointments. The patient expressed understanding and agreed to proceed.  History of Present Illness: Pt is home with no complaints.  Her pain is pretty much completely gone.  She has had f/u with Gi-- abx are finished She saw the "kidney" dr and they are watching the cyst on her R kidney.  She knows to f/u with them if she has increased Pain in R flank.       Observations/Objective: No vitals obtained Pt in NAD   Assessment and Plan: 1. Acute ST elevation myocardial infarction (STEMI) involving right coronary artery [facility]) Per cardiology Pt with no acute/ new symptoms   2. High risk medication use  - CBC with Differential/Platelet; Future - AST; Future - ALT; Future - Creatinine; Future  3. Other inflammatory polyneuropathies ([place]) Per rheum - CBC with Differential/Platelet; Future - AST; Future - ALT; Future - Creatinine; Future  4. Systemic lupus erythematosus, unspecified SLE type, unspecified organ involvement status ([place]) Pt see [facility] - CBC with Differential/Platelet; Future - AST; Future - ALT; Future - Creatinine; Future  5. Colitis Symptoms have resolved See GI notes   6. Cyst of right kidney Per urology We need to get notes    Follow Up Instructions:    I discussed the assessment and treatment plan with the patient. The patient was provided an opportunity to ask questions and all were answered. The patient agreed with the plan and demonstrated an understanding of the instructions.   The patient was advised to call back or seek an in-person evaluation if the symptoms worsen or if the  condition fails to improve as anticipated.  I provided 25 minutes of non-face-to-face time during this encounter.   [name], DO

## 2018-07-15 NOTE — Progress Notes (Addendum)
Virtual Visit via Video Note   This visit type was conducted due to national recommendations for restrictions regarding the COVID-19 Pandemic (e.g. social distancing) in an effort to limit this patient's exposure and mitigate transmission in our community.  Due to her co-morbid illnesses, this patient is at least at moderate risk for complications without adequate follow up.  This format is felt to be most appropriate for this patient at this time.  All issues noted in this document were discussed and addressed.  A limited physical exam was performed with this format.  Please refer to the patient's chart for her consent to tele[facility]   Date:  07/21/2018   ID:  Samantha [name], DOB 1960/10/07, MRN [id number]  Patient Location: Home Provider Location: Home  PCP:  [name], DO  Cardiologist:  Lauree Chandler, MD   Electrophysiologist:  None   Evaluation Performed:  Follow-Up Visit  Chief Complaint: F/U  History of Present Illness:    Samantha [name] is a 57 y.o. female with history of Lupus, HTN, tobacco abuse who presented 06/03/18  With abdominal pain. CTA negative for dissection and troponins >65. When placed on the cath table radiology called with concern of hematoma in ascending aorta wall so Dr. Servando Snare did intraop TEE which did not show aortic dissection. CAD S/P inf STEMI treated with DES prox and distal RCA with significant thrombus in mid RCA and distal RCA branches, mild non-obstructive disease in LAD with intra myocardial bridging in mid distal LAD. She initially developed cardiogenic shock requiring pressers but eventually weaned.   I had a telemedicine visit with the patient 06/10/2018 at which time she was doing well without symptoms.   Patient was discharged from the hospital 06/16/2018 with vomiting diarrhea hypertension and tachycardia found to have sepsis with E. coli bacteremia likely from colitis.  She had no chest pain and troponins  were trending down from her large MI.   I had telemedicine visit with the patient 6/10 at which time BP was running high and she had a lot of confusion over her meds. I restarted losartan 100 mg 1/2 daily and increased aldactone to daily, and arranged for [facility] to see and draw labs. 5/19 Crt 0.73 Hgb 9.9.  Patient says she feels much better. BP 134-140's/88-91. Feels like she needs to take a deep breath at times after walking. No chest pain  The patient does not have symptoms concerning for COVID-19 infection (fever, chills, cough, or new shortness of breath).    Past Medical History:  Diagnosis Date  . Altered mental status   . Backache, unspecified   . Gastric ulcer, unspecified as acute or chronic, without mention of hemorrhage, perforation, or obstruction   . Helicobacter pylori (H. pylori)   . Hyperlipidemia   . Lupus ([place])   . STEMI (ST elevation myocardial infarction) ([place])    PCI to p/d RCA  . Tobacco abuse   . Tobacco use disorder 11/22/2011  . Unspecified essential hypertension    Past Surgical History:  Procedure Laterality Date  . CORONARY/GRAFT ACUTE MI REVASCULARIZATION N/A 05/30/2018   Procedure: Coronary/Graft Acute MI Revascularization;  Surgeon: Burnell Blanks, MD;  Location: [facility];  Service: Cardiovascular;  Laterality: N/A;  . INTRAOPERATIVE TRANSESOPHAGEAL ECHOCARDIOGRAM N/A 05/30/2018   Procedure: Transesophageal Echocardiogram;  Surgeon: Grace Isaac, MD;  Location: [facility];  Service: Vascular;  Laterality: N/A;  . LEFT HEART CATH AND CORONARY ANGIOGRAPHY N/A 05/30/2018   Procedure:  LEFT HEART CATH AND CORONARY ANGIOGRAPHY;  Surgeon: Burnell Blanks, MD;  Location: [facility];  Service: Cardiovascular;  Laterality: N/A;  . UPPER GASTROINTESTINAL ENDOSCOPY  2013     Current Meds  Medication Sig  . acetaminophen (TYLENOL) 500 MG tablet Take 500 mg by mouth every 6 (six) hours as needed for moderate pain or headache.  Marland Kitchen aspirin  EC 81 MG tablet Take 1 tablet (81 mg total) by mouth daily.  Marland Kitchen atorvastatin (LIPITOR) 80 MG tablet Take 1 tablet (80 mg total) by mouth daily at 6 PM.  . cyclobenzaprine (FLEXERIL) 10 MG tablet TAKE 1 TABLET(10 MG) BY MOUTH THREE TIMES DAILY AS NEEDED (Patient taking differently: Take 10 mg by mouth 3 (three) times daily as needed for muscle spasms. )  . hydroxychloroquine (PLAQUENIL) 200 MG tablet Take 300 mg by mouth daily.   Marland Kitchen loperamide (IMODIUM A-D) 2 MG tablet Take 1 tablet (2 mg total) by mouth 4 (four) times daily as needed for diarrhea or loose stools.  Marland Kitchen losartan (COZAAR) 100 MG tablet Take 0.5 tablets (50 mg total) by mouth daily.  . metoprolol tartrate (LOPRESSOR) 25 MG tablet Take 1 tablet (25 mg total) by mouth 2 (two) times daily.  . Multiple Vitamin (MULTIVITAMIN) tablet Take 1 tablet by mouth daily.  . nitroGLYCERIN (NITROSTAT) 0.4 MG SL tablet Place 1 tablet (0.4 mg total) under the tongue every 5 (five) minutes as needed for chest pain.  . pantoprazole (PROTONIX) 20 MG tablet Take 1 tablet (20 mg total) by mouth daily.  Marland Kitchen spironolactone (ALDACTONE) 25 MG tablet Take 1 tablet (25 mg total) by mouth daily.  . ticagrelor (BRILINTA) 90 MG TABS tablet Take 1 tablet (90 mg total) by mouth 2 (two) times daily.  . [DISCONTINUED] metoprolol tartrate (LOPRESSOR) 25 MG tablet Take 25 mg by mouth 2 (two) times daily.     Allergies:   Tramadol   Social History   Tobacco Use  . Smoking status: Former Smoker    Packs/day: 0.75    Years: 40.00    Pack years: 30.00    Types: Cigarettes  . Smokeless tobacco: Never Used  . Tobacco comment: 1 cig a day  Substance Use Topics  . Alcohol use: Yes    Comment: occassional; beer   . Drug use: No     Family Hx: The patient's family history includes Dementia in her mother; Diabetes in her sister; Hypertension in her mother. There is no history of Colon cancer, Stomach cancer, Pancreatic cancer, Colon polyps, or Rectal cancer.  ROS:    Please see the history of present illness.      All other systems reviewed and are negative.   Prior CV studies:   The following studies were reviewed today:   Cath: 05/30/18    Mid LAD lesion is 20% stenosed.  Ost LAD to Prox LAD lesion is 20% stenosed.  Prox RCA lesion is 100% stenosed.  Dist RCA lesion is 90% stenosed.  A drug-eluting stent was successfully placed using a STENT SYNERGY DES 3X24.  Post intervention, there is a 0% residual stenosis.  A drug-eluting stent was successfully placed using a STENT SYNERGY DES 2.75X20.  Post intervention, there is a 0% residual stenosis.   1. 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 Successful PTCA/aspiration thrombectomy and drug eluting stent placement x 1 in the proximal RCA and drug eluting stent placement x 1 in the distal RCA. Significant thrombus burden remains in  the mid RCA and distal RCA branches.  3. Mild non-obstructive disease in the LAD. 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 Cardiogenic shock   TTE: 05/31/18   IMPRESSIONS      1. The left ventricle has low normal systolic function, with an ejection fraction of 50-55%. The cavity size was normal. Left ventricular diastolic Doppler parameters are consistent with impaired relaxation.  2. LVEF is approximately 50 to 55% with severe hypokinesis of the inferor/inferoseptal walls.  3. The right ventricle has mildly reduced systolic function. The cavity was mildly enlarged. There is no increase in right ventricular wall thickness.  4. The inferior vena cava was dilated in size with <50% respiratory variability.  5. The interatrial septum was not assessed. _____________Cath: 05/30/18    Mid LAD lesion is 20% stenosed.  Ost LAD to Prox LAD lesion is 20% stenosed.  Prox RCA lesion is 100% stenosed.  Dist RCA lesion is 90% stenosed.  A drug-eluting stent was successfully placed using a STENT SYNERGY DES  3X24.  Post intervention, there is a 0% residual stenosis.  A drug-eluting stent was successfully placed using a STENT SYNERGY DES 2.75X20.  Post intervention, there is a 0% residual stenosis.   1. 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 Successful PTCA/aspiration thrombectomy and drug eluting stent placement x 1 in the proximal RCA and drug eluting stent placement x 1 in the distal RCA. Significant thrombus burden remains in the mid RCA and distal RCA branches.  3. Mild non-obstructive disease in the LAD. 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 Cardiogenic shock   TTE: 05/31/18   IMPRESSIONS      1. The left ventricle has low normal systolic function, with an ejection fraction of 50-55%. The cavity size was normal. Left ventricular diastolic Doppler parameters are consistent with impaired relaxation.  2. LVEF is approximately 50 to 55% with severe hypokinesis of the inferor/inferoseptal walls.  3. The right ventricle has mildly reduced systolic function. The cavity was mildly enlarged. There is no increase in right ventricular wall thickness.  4. The inferior vena cava was dilated in size with <50% respiratory variability.  5. The interatrial septum was not assessed. _____________   Chest CT 5/6/2020IMPRESSION: Soft tissue again noted surrounding the distal ascending thoracic aorta and proximal aortic arch. This soft tissue now extends adjacent to the main pulmonary artery and measures fluid density. This raises the possibility of fluid within a pericardial recess. There is a small amount of pericardial fluid posterior to the left heart border.     Chest CT 5/6/2020IMPRESSION: Soft tissue again noted surrounding the distal ascending thoracic aorta and proximal aortic arch. This soft tissue now extends adjacent to the main pulmonary artery and measures fluid density. This raises the possibility of fluid within a  pericardial recess. There is a small amount of pericardial fluid posterior to the left heart border.    Labs/Other Tests and Data Reviewed:    EKG:  No ECG reviewed.  Recent Labs: 06/14/2018: ALT 23 06/16/2018: BUN <5; Creatinine, Ser 0.73; Hemoglobin 9.9; Magnesium 2.0; Platelets 286; Potassium 3.5; Sodium 137   Recent Lipid Panel Lab Results  Component Value Date/Time   CHOL 168 05/30/2018 11:07 AM   TRIG 99 05/30/2018 11:07 AM   HDL 64 05/30/2018 11:07 AM   CHOLHDL 2.6 05/30/2018 11:07 AM   LDLCALC NOT CALCULATED 05/30/2018 11:07 AM   LDLDIRECT 141.8 09/15/2012 10:59 AM  Wt Readings from Last 3 Encounters:  07/21/18 142 lb (64.4 kg)  07/08/18 142 lb (64.4 kg)  06/30/18 148 lb (67.1 kg)     Objective:    Vital Signs:  BP (!) 142/88   Pulse 79   Ht 5\' 8"  (1.727 m)   Wt 142 lb (64.4 kg)   LMP 04/11/2011   BMI 21.59 kg/m    VITAL SIGNS:  reviewed GEN:  no acute distress RESPIRATORY:  normal respiratory effort, symmetric expansion CARDIOVASCULAR:  no peripheral edema  ASSESSMENT & PLAN:    CAD status post inferior STEMI 05/2018 treated with DES to the proximal RCA and distal RCA complicated by cardiogenic shock.  ARB restarted at last visit for HTN.  On Brilinta and aspirin- needs to take a deep breath-asked her to drip caffeine with brilinta to see if it helps.  Transaminitis likely secondary to shock labs 06/14/2018  Abnormal chest CT with soft tissue around the distal ascending aorta and proximal aortic arch plan for follow-up CT  Hyperlipidemia on high-dose statins FLP   Essential hypertension BP was running high so I restarted losartan and increased aldactone to daily.-BP doing better but still a little high. Will readjust after labs are checked   COVID-19 Education: The signs and symptoms of COVID-19 were discussed with the patient and how to seek care for testing (follow up with PCP or arrange E-visit).   The importance of social distancing was discussed  today.  Time:   Today, I have spent 9 minutes with the patient with telehealth technology discussing the above problems.     Medication Adjustments/Labs and Tests Ordered: Current medicines are reviewed at length with the patient today.  Concerns regarding medicines are outlined above.   Tests Ordered: No orders of the defined types were placed in this encounter.   Medication Changes: Meds ordered this encounter  Medications  . metoprolol tartrate (LOPRESSOR) 25 MG tablet    Sig: Take 1 tablet (25 mg total) by mouth 2 (two) times daily.    Dispense:  180 tablet    Refill:  3    Follow Up:  In Person in 4 week(s) Ermalinda Barrios 2-3 months Dr. Angelena Form  Signed, Ermalinda Barrios, PA-C  07/21/2018 1:52 PM    [facility]

## 2018-07-16 ENCOUNTER — Other Ambulatory Visit: Payer: Medicare Other

## 2018-07-17 ENCOUNTER — Telehealth (HOSPITAL_COMMUNITY): Payer: Self-pay

## 2018-07-17 NOTE — Telephone Encounter (Signed)
Phone call to Pt to provide information about virtual CR. Pt did not answer. Message was left for Pt to return call.

## 2018-07-21 ENCOUNTER — Telehealth: Payer: Self-pay

## 2018-07-21 ENCOUNTER — Telehealth (INDEPENDENT_AMBULATORY_CARE_PROVIDER_SITE_OTHER): Payer: Medicare Other | Admitting: Physician Assistant

## 2018-07-21 ENCOUNTER — Encounter: Payer: Self-pay | Admitting: Physician Assistant

## 2018-07-21 ENCOUNTER — Other Ambulatory Visit (INDEPENDENT_AMBULATORY_CARE_PROVIDER_SITE_OTHER): Payer: Medicare Other

## 2018-07-21 ENCOUNTER — Other Ambulatory Visit: Payer: Self-pay

## 2018-07-21 VITALS — BP 142/88 | HR 79 | Ht 68.0 in | Wt 142.0 lb

## 2018-07-21 DIAGNOSIS — R7401 Elevation of levels of liver transaminase levels: Secondary | ICD-10-CM

## 2018-07-21 DIAGNOSIS — R740 Nonspecific elevation of levels of transaminase and lactic acid dehydrogenase [LDH]: Secondary | ICD-10-CM | POA: Diagnosis not present

## 2018-07-21 DIAGNOSIS — M329 Systemic lupus erythematosus, unspecified: Secondary | ICD-10-CM | POA: Diagnosis not present

## 2018-07-21 DIAGNOSIS — E785 Hyperlipidemia, unspecified: Secondary | ICD-10-CM

## 2018-07-21 DIAGNOSIS — I2510 Atherosclerotic heart disease of native coronary artery without angina pectoris: Secondary | ICD-10-CM

## 2018-07-21 DIAGNOSIS — R9389 Abnormal findings on diagnostic imaging of other specified body structures: Secondary | ICD-10-CM | POA: Diagnosis not present

## 2018-07-21 DIAGNOSIS — G6189 Other inflammatory polyneuropathies: Secondary | ICD-10-CM | POA: Diagnosis not present

## 2018-07-21 DIAGNOSIS — I10 Essential (primary) hypertension: Secondary | ICD-10-CM | POA: Diagnosis not present

## 2018-07-21 DIAGNOSIS — Z79899 Other long term (current) drug therapy: Secondary | ICD-10-CM

## 2018-07-21 LAB — CBC WITH DIFFERENTIAL/PLATELET
Basophils Absolute: 0 10*3/uL (ref 0.0–0.1)
Basophils Relative: 1.3 % (ref 0.0–3.0)
Eosinophils Absolute: 0.1 10*3/uL (ref 0.0–0.7)
Eosinophils Relative: 3.5 % (ref 0.0–5.0)
HCT: 37.5 % (ref 36.0–46.0)
Hemoglobin: 12 g/dL (ref 12.0–15.0)
Lymphocytes Relative: 40.7 % (ref 12.0–46.0)
Lymphs Abs: 1.4 10*3/uL (ref 0.7–4.0)
MCHC: 32.1 g/dL (ref 30.0–36.0)
MCV: 88.2 fl (ref 78.0–100.0)
Monocytes Absolute: 0.4 10*3/uL (ref 0.1–1.0)
Monocytes Relative: 11.2 % (ref 3.0–12.0)
Neutro Abs: 1.5 10*3/uL (ref 1.4–7.7)
Neutrophils Relative %: 43.3 % (ref 43.0–77.0)
Platelets: 214 10*3/uL (ref 150.0–400.0)
RBC: 4.25 Mil/uL (ref 3.87–5.11)
RDW: 15.1 % (ref 11.5–15.5)
WBC: 3.5 10*3/uL — ABNORMAL LOW (ref 4.0–10.5)

## 2018-07-21 LAB — CREATININE, SERUM: Creatinine, Ser: 0.92 mg/dL (ref 0.40–1.20)

## 2018-07-21 LAB — ALT: ALT: 38 U/L — ABNORMAL HIGH (ref 0–35)

## 2018-07-21 LAB — AST: AST: 33 U/L (ref 0–37)

## 2018-07-21 MED ORDER — METOPROLOL TARTRATE 25 MG PO TABS: 25.0000 mg | ORAL_TABLET | Freq: Two times a day (BID) | ORAL | 3 refills | Status: DC

## 2018-07-21 NOTE — Patient Instructions (Signed)
Medication Instructions:  Your physician recommends that you continue on your current medications as directed. Please refer to the Current Medication list given to you today.  If you need a refill on your cardiac medications before your next appointment, please call your pharmacy.   Lab work: We will arrange for Home Health to come out and draw a BMET and CBC  If you have labs (blood work) drawn today and your tests are completely normal, you will receive your results only by: Marland Kitchen MyChart Message (if you have MyChart) OR . A paper copy in the mail If you have any lab test that is abnormal or we need to change your treatment, we will call you to review the results.  Testing/Procedures: None ordered  Follow-Up: . Follow up with Ermalinda Barrios, PA on 08/25/18 at 12:30 PM . Follow up with Dr. Angelena Form in 2-3 months. Dr. Camillia Herter nurse will contact you to arrange.  Any Other Special Instructions Will Be Listed Below (If Applicable).

## 2018-07-21 NOTE — Telephone Encounter (Signed)
[place] Visit Follow Up Request   Date of Request ([place]):  July 21, 2018  Requesting Provider:  Ermalinda Barrios, PA    Agency Requested:    [facility] Contact:  Glory Buff, NP [address] [place], [place] [address] Phone #:  [phone] Fax #:  [phone]  Patient Demographic Information: Name:  Samantha [name] Age:  58 y.o.   DOB:  1960-03-25  MRN:  [id number]   Home visit progress note(s), lab results, telemetry strips, etc were reviewed.  Provider Recommendations: Draw BMET, CBC this week  Follow up home services requested:  Vital Signs (BP, Pulse, O2, Weight)  Medication Reconciliation  Labs:  CBC, BMET

## 2018-07-22 ENCOUNTER — Telehealth: Payer: Self-pay | Admitting: *Deleted

## 2018-07-22 DIAGNOSIS — I10 Essential (primary) hypertension: Secondary | ICD-10-CM | POA: Diagnosis not present

## 2018-07-22 NOTE — Telephone Encounter (Signed)
Per last office visit with Ermalinda Barrios, PA pt needs appt with Dr.McAlhany in 2-3 months. I placed call to pt to schedule this appointment and left message to call back

## 2018-07-23 ENCOUNTER — Telehealth (HOSPITAL_COMMUNITY): Payer: Self-pay | Admitting: *Deleted

## 2018-07-23 ENCOUNTER — Encounter (HOSPITAL_COMMUNITY)
Admission: RE | Admit: 2018-07-23 | Discharge: 2018-07-23 | Disposition: A | Discharge status: Home / Self Care | Payer: Medicare Other | Source: Ambulatory Visit | Attending: Cardiovascular Disease | Admitting: Cardiovascular Disease

## 2018-07-23 ENCOUNTER — Telehealth: Payer: Self-pay | Admitting: Physician Assistant

## 2018-07-23 ENCOUNTER — Telehealth: Payer: Self-pay

## 2018-07-23 DIAGNOSIS — I10 Essential (primary) hypertension: Secondary | ICD-10-CM

## 2018-07-23 LAB — BASIC METABOLIC PANEL
BUN/Creatinine Ratio: 13 (ref 9–23)
BUN: 9 mg/dL (ref 6–24)
CO2: 19 mmol/L — ABNORMAL LOW (ref 20–29)
Calcium: 9.7 mg/dL (ref 8.7–10.2)
Chloride: 110 mmol/L — ABNORMAL HIGH (ref 96–106)
Creatinine, Ser: 0.7 mg/dL (ref 0.57–1.00)
GFR calc Af Amer: 111 mL/min/{1.73_m2} (ref >59)
GFR calc non Af Amer: 96 mL/min/{1.73_m2} (ref >59)
Glucose: 128 mg/dL — ABNORMAL HIGH (ref 65–99)
Potassium: 4.3 mmol/L (ref 3.5–5.2)
Sodium: 143 mmol/L (ref 134–144)

## 2018-07-23 LAB — CBC
Hematocrit: 38.9 % (ref 34.0–46.6)
Hemoglobin: 12.2 g/dL (ref 11.1–15.9)
MCH: 27.7 pg (ref 26.6–33.0)
MCHC: 31.4 g/dL — ABNORMAL LOW (ref 31.5–35.7)
MCV: 88 fL (ref 79–97)
Platelets: 220 10*3/uL (ref 150–450)
RBC: 4.41 x10E6/uL (ref 3.77–5.28)
RDW: 13.7 % (ref 11.7–15.4)
WBC: 2.9 10*3/uL — ABNORMAL LOW (ref 3.4–10.8)

## 2018-07-23 MED ORDER — LOSARTAN POTASSIUM 100 MG PO TABS: 100.0000 mg | ORAL_TABLET | Freq: Every day | ORAL | 3 refills | Status: DC

## 2018-07-23 NOTE — Progress Notes (Signed)
HH reports a golf ball size mass right jaw line with pain. She never complained of it when I had the telemedicine visit and I couldn't see it on exam-house was dark. Can you ask her to f/u with PCP? If she doesn't have one, may need to go to urgent care. Thanks

## 2018-07-23 NOTE — Telephone Encounter (Signed)
Patient made aware of results and recommendations to increase losartan 100 mg QD. BMET ordered and appt made for 7/13. She verbalized understanding and thanked me for the call.

## 2018-07-23 NOTE — Telephone Encounter (Signed)
New Message ° ° ° °Patient returning your call for lab results. °

## 2018-07-23 NOTE — Telephone Encounter (Signed)
Editor: Imogene Burn, PA-C (Physician Assistant)     Added by: [x] Lenze, Michele M, PA-C   [facility] reports a golf ball size mass right jaw line with pain. She never complained of it when I had the telemedicine visit and I couldn't see it on exam-house was dark. Can you ask her to f/u with PCP? If she doesn't have one, may need to go to urgent care. Thanks

## 2018-07-23 NOTE — Telephone Encounter (Signed)
-----   Message from Imogene Burn, [place] sent at 07/23/2018  4:55 PM EDT -----   ----- Message ----- From: Merleen Milliner Sent: 07/23/2018  12:05 PM EDT To: Imogene Burn, PA-C

## 2018-07-23 NOTE — Telephone Encounter (Signed)
Patient made aware she will follow up with her PCP. She still sees [facility], [place]. Will forward information.

## 2018-07-23 NOTE — Telephone Encounter (Addendum)
Called and spoke to pt regarding Virtual Cardiac Rehab.  Pt  was able to download the [employer] app on their smart device with no issues. Pt set up their account and received the following welcome message -"Welcome to the [facility] and Pulmonary Rehabilitation program. We hope that you will find the exercise program beneficial in your recovery process. Our staff is available to assist with any questions/concerns about your exercise routine. Best wishes". Brief orientation provided to with the advisement to watch the "Intro to Rehab" series located under the Resource tab. Pt verbalized understanding. Will continue to follow and monitor pt progress with feedback as needed. Cheria says that she has stopped smoking since her discharge from the hospital. Patient was congratulated on her success! Barnet Pall, RN,BSN 07/23/2018 11:32 AM

## 2018-07-23 NOTE — Telephone Encounter (Signed)
Pt is interested in participating in Virtual Cardiac Rehab. Pt advised that Virtual Cardiac Rehab is provided at no cost to the patient.  Checklist:  1. Pt has smart device  ie smartphone and/or ipad for downloading an app  Yes 2. Reliable internet/wifi service    Yes 3. Understands how to use their smartphone and navigate within an app.  Yes   Reviewed with pt the scheduling process for virtual cardiac rehab.  Pt verbalized understanding.            Confirm Consent - In the setting of the current Covid19 crisis, you are scheduled for a phone visit with your Cardiac or Pulmonary team member.  Just as we do with many in-gym visits, in order for you to participate in this visit, we must obtain consent.  If you'd like, I can send this to your mychart (if signed up) or email for you to review.  Otherwise, I can obtain your verbal consent now.  By agreeing to a telephone visit, we'd like you to understand that the technology does not allow for your Cardiac or Pulmonary Rehab team member to perform a physical assessment, and thus may limit their ability to fully assess your ability to perform exercise programs. If your provider identifies any concerns that need to be evaluated in person, we will make arrangements to do so.  Finally, though the technology is pretty good, we cannot assure that it will always work on either your or our end and we cannot ensure that we have a secure connection.  Cardiac and Pulmonary Rehab Telehealth visits and "At Home" cardiac and pulmonary rehab are provided at no cost to you.        Are you willing to proceed?"        STAFF: Did the patient verbally acknowledge consent to telehealth visit? Document YES/NO here: Yes     Barnet Pall RN  Cardiac and Pulmonary Rehab Staff        Date June 25    @ Time 11:30 AM

## 2018-07-23 NOTE — Telephone Encounter (Signed)
As long as pt has no covid symptoms she can have ov

## 2018-07-27 NOTE — Telephone Encounter (Signed)
Pt can be scheduled to see Dr. Angelena Form on August 24,2020 at 8:00.  I placed call to pt and left message to call office.

## 2018-08-03 ENCOUNTER — Other Ambulatory Visit: Payer: Self-pay | Admitting: Physician Assistant

## 2018-08-10 ENCOUNTER — Other Ambulatory Visit: Payer: Self-pay

## 2018-08-10 ENCOUNTER — Other Ambulatory Visit: Payer: Medicare Other | Admitting: *Deleted

## 2018-08-10 DIAGNOSIS — I10 Essential (primary) hypertension: Secondary | ICD-10-CM

## 2018-08-10 LAB — BASIC METABOLIC PANEL
BUN/Creatinine Ratio: 18 (ref 9–23)
BUN: 13 mg/dL (ref 6–24)
CO2: 20 mmol/L (ref 20–29)
Calcium: 9.5 mg/dL (ref 8.7–10.2)
Chloride: 106 mmol/L (ref 96–106)
Creatinine, Ser: 0.74 mg/dL (ref 0.57–1.00)
GFR calc Af Amer: 104 mL/min/{1.73_m2} (ref >59)
GFR calc non Af Amer: 90 mL/min/{1.73_m2} (ref >59)
Glucose: 89 mg/dL (ref 65–99)
Potassium: 4.1 mmol/L (ref 3.5–5.2)
Sodium: 139 mmol/L (ref 134–144)

## 2018-08-11 ENCOUNTER — Ambulatory Visit (INDEPENDENT_AMBULATORY_CARE_PROVIDER_SITE_OTHER): Payer: Medicare Other | Admitting: Family Medicine

## 2018-08-11 ENCOUNTER — Encounter (HOSPITAL_COMMUNITY): Payer: Medicare Other

## 2018-08-11 ENCOUNTER — Encounter: Payer: Self-pay | Admitting: Family Medicine

## 2018-08-11 VITALS — BP 123/87 | HR 70 | Temp 98.1°F | Resp 18 | Ht 68.0 in | Wt 139.8 lb

## 2018-08-11 DIAGNOSIS — I2510 Atherosclerotic heart disease of native coronary artery without angina pectoris: Secondary | ICD-10-CM | POA: Diagnosis not present

## 2018-08-11 DIAGNOSIS — R591 Generalized enlarged lymph nodes: Secondary | ICD-10-CM

## 2018-08-11 MED ORDER — CEPHALEXIN 500 MG PO CAPS: 500.0000 mg | ORAL_CAPSULE | Freq: Two times a day (BID) | ORAL | 0 refills | Status: DC

## 2018-08-11 NOTE — Progress Notes (Signed)
Patient ID: Samantha [name], female    DOB: 08-19-1960  Age: 58 y.o. MRN: [id number]    Subjective:  Subjective  HPI Samantha [name] presents for soreness in her throat on the r side since she was intubated in the hospital   It has not gotten any worse but is not improving at all  No fever No congestion or sore throat.    Review of Systems  Constitutional: Negative for appetite change, diaphoresis, fatigue and unexpected weight change.  HENT: Negative for congestion, ear discharge, ear pain, facial swelling, hearing loss, mouth sores, nosebleeds, postnasal drip, rhinorrhea, sinus pressure, sinus pain, sneezing, sore throat, tinnitus, trouble swallowing and voice change.   Eyes: Negative for pain, redness and visual disturbance.  Respiratory: Negative for cough, chest tightness, shortness of breath and wheezing.   Cardiovascular: Negative for chest pain, palpitations and leg swelling.  Endocrine: Negative for cold intolerance, heat intolerance, polydipsia, polyphagia and polyuria.  Genitourinary: Negative for difficulty urinating, dysuria and frequency.  Musculoskeletal: Positive for neck pain. Negative for neck stiffness.  Neurological: Negative for dizziness, light-headedness, numbness and headaches.    History Past Medical History:  Diagnosis Date  . Altered mental status   . Backache, unspecified   . Gastric ulcer, unspecified as acute or chronic, without mention of hemorrhage, perforation, or obstruction   . Helicobacter pylori (H. pylori)   . Hyperlipidemia   . Lupus ([place])   . STEMI (ST elevation myocardial infarction) ([place])    PCI to p/d RCA  . Tobacco abuse   . Tobacco use disorder 11/22/2011  . Unspecified essential hypertension     She has a past surgical history that includes Upper gastrointestinal endoscopy (2013); Coronary/Graft Acute MI Revascularization (N/A, 05/30/2018); LEFT HEART CATH AND CORONARY ANGIOGRAPHY (N/A, 05/30/2018); and Intraoprative transesophageal  echocardiogram (N/A, 05/30/2018).   Her family history includes Dementia in her mother; Diabetes in her sister; Hypertension in her mother.She reports that she has quit smoking. Her smoking use included cigarettes. She has a 30.00 pack-year smoking history. She has never used smokeless tobacco. She reports current alcohol use. She reports that she does not use drugs.  Current Outpatient Medications on File Prior to Visit  Medication Sig Dispense Refill  . acetaminophen (TYLENOL) 500 MG tablet Take 500 mg by mouth every 6 (six) hours as needed for moderate pain or headache.    Marland Kitchen aspirin EC 81 MG tablet Take 1 tablet (81 mg total) by mouth daily.    Marland Kitchen atorvastatin (LIPITOR) 80 MG tablet Take 1 tablet (80 mg total) by mouth daily at 6 PM. 90 tablet 3  . cyclobenzaprine (FLEXERIL) 10 MG tablet TAKE 1 TABLET(10 MG) BY MOUTH THREE TIMES DAILY AS NEEDED (Patient taking differently: Take 10 mg by mouth 3 (three) times daily as needed for muscle spasms. ) 30 tablet 0  . hydroxychloroquine (PLAQUENIL) 200 MG tablet Take 300 mg by mouth daily.     Marland Kitchen loperamide (IMODIUM A-D) 2 MG tablet Take 1 tablet (2 mg total) by mouth 4 (four) times daily as needed for diarrhea or loose stools. 30 tablet 0  . losartan (COZAAR) 100 MG tablet Take 1 tablet (100 mg total) by mouth daily. 90 tablet 3  . metoprolol tartrate (LOPRESSOR) 25 MG tablet Take 1 tablet (25 mg total) by mouth 2 (two) times daily. 180 tablet 3  . Multiple Vitamin (MULTIVITAMIN) tablet Take 1 tablet by mouth daily.    . nitroGLYCERIN (NITROSTAT) 0.4 MG SL tablet PLACE 1 TABLET (0.4 MG  TOTAL) UNDER THE TONGUE EVERY 5 (FIVE) MINUTES AS NEEDED FOR CHEST PAIN. 25 tablet 2  . pantoprazole (PROTONIX) 20 MG tablet Take 1 tablet (20 mg total) by mouth daily. 90 tablet 3  . spironolactone (ALDACTONE) 25 MG tablet Take 1 tablet (25 mg total) by mouth daily. 90 tablet 3  . ticagrelor (BRILINTA) 90 MG TABS tablet Take 1 tablet (90 mg total) by mouth 2 (two) times  daily. 180 tablet 3   No current facility-administered medications on file prior to visit.      Objective:  Objective  Physical Exam Vitals signs and nursing note reviewed.  Constitutional:      Appearance: She is well-developed.  HENT:     Head: Normocephalic and atraumatic.  Eyes:     Conjunctiva/sclera: Conjunctivae normal.  Neck:     Musculoskeletal: Normal range of motion and neck supple.     Thyroid: No thyromegaly.     Vascular: No carotid bruit or JVD.   Cardiovascular:     Rate and Rhythm: Normal rate and regular rhythm.     Heart sounds: Normal heart sounds. No murmur.  Pulmonary:     Effort: Pulmonary effort is normal. No respiratory distress.     Breath sounds: Normal breath sounds. No wheezing or rales.  Chest:     Chest wall: No tenderness.  Lymphadenopathy:     Cervical: Cervical adenopathy present.     Right cervical: Superficial cervical adenopathy present.  Neurological:     Mental Status: She is alert and oriented to person, place, and time.    BP 123/87 (BP Location: Left Arm, Patient Position: Sitting, Cuff Size: Normal)   Pulse 70   Temp 98.1 F (36.7 C) (Oral)   Resp 18   Ht 5\' 8"  (1.727 m)   Wt 139 lb 12.8 oz (63.4 kg)   LMP 04/11/2011   SpO2 100%   BMI 21.26 kg/m  Wt Readings from Last 3 Encounters:  08/11/18 139 lb 12.8 oz (63.4 kg)  07/21/18 142 lb (64.4 kg)  07/08/18 142 lb (64.4 kg)     Lab Results  Component Value Date   WBC 2.9 (L) 07/22/2018   HGB 12.2 07/22/2018   HCT 38.9 07/22/2018   PLT 220 07/22/2018   GLUCOSE 89 08/10/2018   CHOL 168 05/30/2018   TRIG 99 05/30/2018   HDL 64 05/30/2018   LDLDIRECT 141.8 09/15/2012   LDLCALC NOT CALCULATED 05/30/2018   ALT 38 (H) 07/21/2018   AST 33 07/21/2018   NA 139 08/10/2018   K 4.1 08/10/2018   CL 106 08/10/2018   CREATININE 0.74 08/10/2018   BUN 13 08/10/2018   CO2 20 08/10/2018   TSH 0.83 09/15/2012   INR 1.1 05/30/2018   HGBA1C 5.6 03/13/2011    No results  found.   Assessment & Plan:  Plan  I am having Samantha [name] start on cephALEXin. I am also having her maintain her multivitamin, aspirin EC, acetaminophen, atorvastatin, pantoprazole, ticagrelor, hydroxychloroquine, cyclobenzaprine, loperamide, spironolactone, metoprolol tartrate, losartan, and nitroGLYCERIN.  Meds ordered this encounter  Medications  . cephALEXin (KEFLEX) 500 MG capsule    Sig: Take 1 capsule (500 mg total) by mouth 2 (two) times daily.    Dispense:  20 capsule    Refill:  0    Problem List Items Addressed This Visit    None    Visit Diagnoses    Lymphadenopathy of head and neck    -  Primary   Relevant Medications  cephALEXin (KEFLEX) 500 MG capsule   Other Relevant Orders   CBC with Differential/Platelet    if no better in 48-72 hours consider US/ CT   Follow-up: Return in about 2 weeks (around 08/25/2018) for lymphadenopathy.  [name], DO

## 2018-08-11 NOTE — Patient Instructions (Signed)

## 2018-08-12 ENCOUNTER — Ambulatory Visit (HOSPITAL_COMMUNITY)
Admission: EM | Admit: 2018-08-12 | Discharge: 2018-08-12 | Disposition: A | Discharge status: Home / Self Care | Payer: Medicare Other | Attending: Family Medicine | Admitting: Family Medicine

## 2018-08-12 ENCOUNTER — Encounter (HOSPITAL_COMMUNITY): Payer: Self-pay | Admitting: Emergency Medicine

## 2018-08-12 ENCOUNTER — Other Ambulatory Visit: Payer: Self-pay

## 2018-08-12 DIAGNOSIS — T63461A Toxic effect of venom of wasps, accidental (unintentional), initial encounter: Secondary | ICD-10-CM | POA: Diagnosis not present

## 2018-08-12 DIAGNOSIS — T7840XA Allergy, unspecified, initial encounter: Secondary | ICD-10-CM

## 2018-08-12 LAB — CBC WITH DIFFERENTIAL/PLATELET
Basophils Absolute: 0 10*3/uL (ref 0.0–0.1)
Basophils Relative: 0.7 % (ref 0.0–3.0)
Eosinophils Absolute: 0.1 10*3/uL (ref 0.0–0.7)
Eosinophils Relative: 3.2 % (ref 0.0–5.0)
HCT: 39.6 % (ref 36.0–46.0)
Hemoglobin: 12.6 g/dL (ref 12.0–15.0)
Lymphocytes Relative: 41.9 % (ref 12.0–46.0)
Lymphs Abs: 1.9 10*3/uL (ref 0.7–4.0)
MCHC: 31.7 g/dL (ref 30.0–36.0)
MCV: 87.5 fl (ref 78.0–100.0)
Monocytes Absolute: 0.5 10*3/uL (ref 0.1–1.0)
Monocytes Relative: 10.4 % (ref 3.0–12.0)
Neutro Abs: 2 10*3/uL (ref 1.4–7.7)
Neutrophils Relative %: 43.8 % (ref 43.0–77.0)
Platelets: 173 10*3/uL (ref 150.0–400.0)
RBC: 4.53 Mil/uL (ref 3.87–5.11)
RDW: 14.4 % (ref 11.5–15.5)
WBC: 4.5 10*3/uL (ref 4.0–10.5)

## 2018-08-12 MED ORDER — CETIRIZINE HCL 10 MG PO CAPS: 10.0000 mg | ORAL_CAPSULE | Freq: Every day | ORAL | 0 refills | Status: DC

## 2018-08-12 MED ORDER — CEPHALEXIN 500 MG PO CAPS: 500.0000 mg | ORAL_CAPSULE | Freq: Four times a day (QID) | ORAL | 0 refills | Status: DC

## 2018-08-12 MED ORDER — PREDNISONE 50 MG PO TABS: 50.0000 mg | ORAL_TABLET | Freq: Every day | ORAL | 0 refills | Status: AC

## 2018-08-12 NOTE — Discharge Instructions (Addendum)
Begin Prednisone daily for 5 days with food in the morning to help with swelling, itching and inflammation Daily cetirizine in AM, may use benadryl at night time Alternate ice and heat  If developing increased redness or pain begin keflex 4 times daily for 5 days  Follow up if symptoms continuing to worsen or do not improve with the above

## 2018-08-12 NOTE — ED Provider Notes (Addendum)
[facility]    CSN: [id number] Arrival date & time: 08/12/18  1105     History   Chief Complaint Chief Complaint  Patient presents with  . Insect Bite    HPI Samantha [name] is a 58 y.o. female history of tobacco use, hypertension, CAD, lupus, presenting today for evaluation of an wasps sting.  Patient was stung Monday night.  She has had itching and swelling on her hand/wrist.  She is worried as the swelling has spread to her hand as well as forearm.  She has not had much pain associated with this.  She has taken some Benadryl.  She denies rash or swelling elsewhere.  Denies difficulty breathing or shortness of breath.  Denies history of diabetes.  HPI  Past Medical History:  Diagnosis Date  . Altered mental status   . Backache, unspecified   . Gastric ulcer, unspecified as acute or chronic, without mention of hemorrhage, perforation, or obstruction   . Helicobacter pylori (H. pylori)   . Hyperlipidemia   . Lupus ([place])   . STEMI (ST elevation myocardial infarction) ([place])    PCI to p/d RCA  . Tobacco abuse   . Tobacco use disorder 11/22/2011  . Unspecified essential hypertension     Patient Active Problem List   Diagnosis Date Noted  . Colitis 06/13/2018  . Cyst of right kidney 06/13/2018  . CAD (coronary artery disease) 06/09/2018  . Abnormal chest CT 06/09/2018  . Transaminitis 06/09/2018  . Cardiogenic shock ([place]) 06/03/2018  . Acute respiratory failure ([place]) 06/03/2018  . Acute ST elevation myocardial infarction (STEMI) of inferior wall ([place]) 05/30/2018  . Acute ST elevation myocardial infarction (STEMI) involving right coronary artery ([place])   . Chronic bilateral low back pain without sciatica 12/08/2017  . Hyperlipidemia LDL goal <70 02/11/2017  . Gastroesophageal reflux disease without esophagitis 02/11/2017  . HTN (hypertension) 06/23/2015  . Abdominal pain, epigastric 06/23/2015  . Polypharmacy 06/30/2012  . Achilles tendinitis 11/20/2011  .  Lupus (systemic lupus erythematosus) ([place]) 08/22/2011  . Disseminated lupus erythematosus ([place]) 08/21/2011  . 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 Headache(784.0) 03/09/2011  . Back pain 03/09/2011  . Hyponatremia 03/09/2011  . Hypokalemia 03/09/2011    Past Surgical History:  Procedure Laterality Date  . CORONARY/GRAFT ACUTE MI REVASCULARIZATION N/A 05/30/2018   Procedure: Coronary/Graft Acute MI Revascularization;  Surgeon: Burnell Blanks, MD;  Location: [facility];  Service: Cardiovascular;  Laterality: N/A;  . INTRAOPERATIVE TRANSESOPHAGEAL ECHOCARDIOGRAM N/A 05/30/2018   Procedure: Transesophageal Echocardiogram;  Surgeon: Grace Isaac, MD;  Location: [facility] OR;  Service: Vascular;  Laterality: N/A;  . LEFT HEART CATH AND CORONARY ANGIOGRAPHY N/A 05/30/2018   Procedure: LEFT HEART CATH AND CORONARY ANGIOGRAPHY;  Surgeon: Burnell Blanks, MD;  Location: [facility];  Service: Cardiovascular;  Laterality: N/A;  . UPPER GASTROINTESTINAL ENDOSCOPY  2013    OB History   No obstetric history on file.      Home Medications    Prior to Admission medications   Medication Sig Start Date End Date Taking? Authorizing Provider  aspirin EC 81 MG tablet Take 1 tablet (81 mg total) by mouth daily. 08/05/16  Yes [name], DO  atorvastatin (LIPITOR) 80 MG tablet Take 1 tablet (80 mg total) by mouth daily at 6 PM. 06/10/18  Yes Imogene Burn, PA-C  cyclobenzaprine (FLEXERIL) 10 MG tablet TAKE 1 TABLET(10 MG) BY MOUTH THREE TIMES DAILY AS NEEDED Patient taking differently:  Take 10 mg by mouth 3 (three) times daily as needed for muscle spasms.  06/11/18  Yes [name], DO  hydroxychloroquine (PLAQUENIL) 200 MG tablet Take 300 mg by mouth daily.    Yes [provider]  losartan (COZAAR) 100 MG tablet Take 1 tablet (100 mg total) by mouth daily. 07/23/18  Yes Imogene Burn, PA-C  metoprolol tartrate (LOPRESSOR) 25 MG  tablet Take 1 tablet (25 mg total) by mouth 2 (two) times daily. 07/21/18  Yes Imogene Burn, PA-C  Multiple Vitamin (MULTIVITAMIN) tablet Take 1 tablet by mouth daily.   Yes [provider]  pantoprazole (PROTONIX) 20 MG tablet Take 1 tablet (20 mg total) by mouth daily. 06/10/18  Yes Imogene Burn, PA-C  spironolactone (ALDACTONE) 25 MG tablet Take 1 tablet (25 mg total) by mouth daily. 07/08/18  Yes Imogene Burn, PA-C  ticagrelor (BRILINTA) 90 MG TABS tablet Take 1 tablet (90 mg total) by mouth 2 (two) times daily. 06/10/18  Yes Imogene Burn, PA-C  acetaminophen (TYLENOL) 500 MG tablet Take 500 mg by mouth every 6 (six) hours as needed for moderate pain or headache.    [provider]  cephALEXin (KEFLEX) 500 MG capsule Take 1 capsule (500 mg total) by mouth 4 (four) times daily. 08/12/18   Wieters, Hallie C, PA-C  Cetirizine HCl 10 MG CAPS Take 1 capsule (10 mg total) by mouth daily for 10 days. 08/12/18 08/22/18  Wieters, Hallie C, PA-C  loperamide (IMODIUM A-D) 2 MG tablet Take 1 tablet (2 mg total) by mouth 4 (four) times daily as needed for diarrhea or loose stools. 06/16/18   Manuella Ghazi, Pratik D, DO  nitroGLYCERIN (NITROSTAT) 0.4 MG SL tablet PLACE 1 TABLET (0.4 MG TOTAL) UNDER THE TONGUE EVERY 5 (FIVE) MINUTES AS NEEDED FOR CHEST PAIN. 08/03/18   Imogene Burn, PA-C  predniSONE (DELTASONE) 50 MG tablet Take 1 tablet (50 mg total) by mouth daily for 5 days. 08/12/18 08/17/18  Wieters, Elesa Hacker, PA-C    Family History Family History  Problem Relation Age of Onset  . Diabetes Sister   . Hypertension Mother   . Dementia Mother   . Colon cancer Neg Hx   . Stomach cancer Neg Hx   . Pancreatic cancer Neg Hx   . Colon polyps Neg Hx   . Rectal cancer Neg Hx     Social History Social History   Tobacco Use  . Smoking status: Former Smoker    Packs/day: 0.75    Years: 40.00    Pack years: 30.00    Types: Cigarettes  . Smokeless tobacco: Never Used  . Tobacco  comment: 1 cig a day  Substance Use Topics  . Alcohol use: Yes    Comment: occassional; beer   . Drug use: No     Allergies   Tramadol   Review of Systems Review of Systems  Constitutional: Negative for fatigue and fever.  HENT: Negative for mouth sores.   Eyes: Negative for visual disturbance.  Respiratory: Negative for shortness of breath.   Cardiovascular: Negative for chest pain.  Gastrointestinal: Negative for abdominal pain, nausea and vomiting.  Musculoskeletal: Positive for joint swelling. Negative for arthralgias.  Skin: Negative for color change, rash and wound.  Neurological: Negative for dizziness, weakness, light-headedness and headaches.     Physical Exam Triage Vital Signs ED Triage Vitals  Enc Vitals Group     BP      Pulse  Resp      Temp      Temp src      SpO2      Weight      Height      Head Circumference      Peak Flow      Pain Score      Pain Loc      Pain Edu?      Excl. in [place]?    No data found.  Updated Vital Signs BP 119/81 (BP Location: Right Arm)   Pulse 69   Temp 97.8 F (36.6 C) (Oral)   Resp 18   LMP 04/11/2011   SpO2 98%   Visual Acuity Right Eye Distance:   Left Eye Distance:   Bilateral Distance:    Right Eye Near:   Left Eye Near:    Bilateral Near:     Physical Exam Vitals signs and nursing note reviewed.  Constitutional:      Appearance: She is well-developed.     Comments: No acute distress  HENT:     Head: Normocephalic and atraumatic.     Nose: Nose normal.  Eyes:     Conjunctiva/sclera: Conjunctivae normal.  Neck:     Musculoskeletal: Neck supple.  Cardiovascular:     Rate and Rhythm: Normal rate.  Pulmonary:     Effort: Pulmonary effort is normal. No respiratory distress.  Abdominal:     General: There is no distension.  Musculoskeletal: Normal range of motion.  Skin:    General: Skin is warm and dry.     Comments: Left hand and distal forearm with swelling, no significant overlying  erythema, nontender to touch. Radial pulse 2+  Neurological:     Mental Status: She is alert and oriented to person, place, and time.      UC Treatments / Results  Labs (all labs ordered are listed, but only abnormal results are displayed) Labs Reviewed - No data to display  EKG   Radiology No results found.  Procedures Procedures (including critical care time)  Medications Ordered in UC Medications - No data to display  Initial Impression / Assessment and Plan / UC Course  I have reviewed the triage vital signs and the nursing notes.  Pertinent labs & imaging results that were available during my care of the patient were reviewed by me and considered in my medical decision making (see chart for details).     Patient appears to have local allergic reaction secondary to wasp sting.  No signs of cellulitis at this time.  Will treat with prednisone, antihistamines.  Will provide prescription of Keflex to fill if developing any pain or redness.  Continue to monitor,Discussed strict return precautions. Patient verbalized understanding and is agreeable with plan.  Final Clinical Impressions(s) / UC Diagnoses   Final diagnoses:  Wasp sting, accidental or unintentional, initial encounter  Allergic reaction, initial encounter     Discharge Instructions     Begin Prednisone daily for 5 days with food in the morning to help with swelling, itching and inflammation Daily cetirizine in AM, may use benadryl at night time Alternate ice and heat  If developing increased redness or pain begin keflex 4 times daily for 5 days  Follow up if symptoms continuing to worsen or do not improve with the above    ED Prescriptions    Medication Sig Dispense Auth. Provider   predniSONE (DELTASONE) 50 MG tablet Take 1 tablet (50 mg total) by mouth daily for 5 days.  5 tablet Wieters, Hallie C, PA-C   Cetirizine HCl 10 MG CAPS Take 1 capsule (10 mg total) by mouth daily for 10 days. 10 capsule  Wieters, Hallie C, PA-C   cephALEXin (KEFLEX) 500 MG capsule Take 1 capsule (500 mg total) by mouth 4 (four) times daily. 20 capsule Wieters, Hallie C, PA-C     Controlled Substance Prescriptions [place] Controlled Substance Registry consulted? Not Applicable   Janith Lima, PA-C 08/12/18 1242    Wieters, [place] C, [place] 08/12/18 1244

## 2018-08-12 NOTE — ED Triage Notes (Signed)
Patient stung by a wasp on Monday.  Sit located on left hand.  Patient has swelling in left hand and all fingers and patient reports swelling spreading up left wrist

## 2018-08-17 ENCOUNTER — Telehealth (HOSPITAL_COMMUNITY): Payer: Self-pay | Admitting: *Deleted

## 2018-08-17 NOTE — Progress Notes (Signed)
Virtual Visit via Video Note  I connected with patient on 08/18/18 at  9:00 AM EDT by audio enabled telemedicine application and verified that I am speaking with the correct person using two identifiers.   THIS ENCOUNTER IS A VIRTUAL VISIT DUE TO COVID-19 - PATIENT WAS NOT SEEN IN THE OFFICE. PATIENT HAS CONSENTED TO VIRTUAL VISIT / TELEMEDICINE VISIT   Location of patient: home  Location of provider: office  I discussed the limitations of evaluation and management by telemedicine and the availability of in person appointments. The patient expressed understanding and agreed to proceed.   Subjective:   Samantha [name] is a 58 y.o. female who presents for Medicare Annual (Subsequent) preventive examination.  Review of Systems: No ROS.  Medicare Wellness Virtual Visit.  Visual/audio telehealth visit, UTA vital signs.   See social history for additional risk factors. Cardiac Risk Factors include: advanced age (>9men, >38 women);dyslipidemia;hypertension Sleep patterns: no issues Home Safety/Smoke Alarms: Feels safe in home. Smoke alarms in place.  Lives in 1 story home with mother.    Female:   Pap- 12/25/15 Pt declines at this time due to covid.        Mammo- 01/31/17. Pt declines at this time due to covid.          CCS- 03/04/14. Next due Feb. 2021    Objective:     Vitals: BP 134/88 Comment: pt reported vitals  Pulse 79   LMP 04/11/2011     Advanced Directives 08/18/2018 06/13/2018 05/30/2018 08/12/2017 12/25/2015 12/13/2015 03/14/2014  Does Patient Have a Medical Advance Directive? Yes No Yes Yes No No No  Type of Academic librarian - Living will [place];Living will - - -  Does patient want to make changes to medical advance directive? No - Patient declined - - - - - -  Copy of Uncertain in Chart? Yes - validated most recent copy scanned in chart (See row information) - - Yes - - -  Would patient like information  on creating a medical advance directive? - No - Patient declined - - Yes (MAU/Ambulatory/Procedural Areas - Information given) No - patient declined information No - patient declined information  Pre-existing out of facility DNR order (yellow form or pink MOST form) - - - - - - -    Tobacco Social History   Tobacco Use  Smoking Status Former Smoker  . Packs/day: 0.75  . Years: 40.00  . Pack years: 30.00  . Types: Cigarettes  Smokeless Tobacco Never Used  Tobacco Comment   quit 05/2018     Counseling given: Not Answered Comment: quit 05/2018   Clinical Intake: Pain : No/denies pain    Past Medical History:  Diagnosis Date  . Altered mental status   . Backache, unspecified   . Gastric ulcer, unspecified as acute or chronic, without mention of hemorrhage, perforation, or obstruction   . Helicobacter pylori (H. pylori)   . Hyperlipidemia   . Lupus ([place])   . STEMI (ST elevation myocardial infarction) ([place])    PCI to p/d RCA  . Tobacco abuse   . Tobacco use disorder 11/22/2011  . Unspecified essential hypertension    Past Surgical History:  Procedure Laterality Date  . CORONARY/GRAFT ACUTE MI REVASCULARIZATION N/A 05/30/2018   Procedure: Coronary/Graft Acute MI Revascularization;  Surgeon: Burnell Blanks, MD;  Location: [facility];  Service: Cardiovascular;  Laterality: N/A;  . INTRAOPERATIVE TRANSESOPHAGEAL ECHOCARDIOGRAM N/A 05/30/2018  Procedure: Transesophageal Echocardiogram;  Surgeon: Grace Isaac, MD;  Location: [facility];  Service: Vascular;  Laterality: N/A;  . LEFT HEART CATH AND CORONARY ANGIOGRAPHY N/A 05/30/2018   Procedure: LEFT HEART CATH AND CORONARY ANGIOGRAPHY;  Surgeon: Burnell Blanks, MD;  Location: [facility];  Service: Cardiovascular;  Laterality: N/A;  . UPPER GASTROINTESTINAL ENDOSCOPY  2013   Family History  Problem Relation Age of Onset  . Diabetes Sister   . Hypertension Mother   . Dementia Mother   . Colon  cancer Neg Hx   . Stomach cancer Neg Hx   . Pancreatic cancer Neg Hx   . Colon polyps Neg Hx   . Rectal cancer Neg Hx    Social History   Socioeconomic History  . Marital status: Single    Spouse name: Not on file  . Number of children: Not on file  . Years of education: Not on file  . Highest education level: Not on file  Occupational History  . Occupation: permanant disability    Employer: [employer]  Social Needs  . Financial resource strain: Not on file  . Food insecurity    Worry: Not on file    Inability: Not on file  . Transportation needs    Medical: Not on file    Non-medical: Not on file  Tobacco Use  . Smoking status: Former Smoker    Packs/day: 0.75    Years: 40.00    Pack years: 30.00    Types: Cigarettes  . Smokeless tobacco: Never Used  . Tobacco comment: quit 05/2018  Substance and Sexual Activity  . Alcohol use: Yes    Comment: occassional; beer   . Drug use: No  . Sexual activity: Not Currently    Partners: Male  Lifestyle  . Physical activity    Days per week: Not on file    Minutes per session: Not on file  . Stress: Not on file  Relationships  . Social Herbalist on phone: Not on file    Gets together: Not on file    Attends religious service: Not on file    Active member of club or organization: Not on file    Attends meetings of clubs or organizations: Not on file    Relationship status: Not on file  Other Topics Concern  . Not on file  Social History Narrative   Exercise--- walking    Outpatient Encounter Medications as of 08/18/2018  Medication Sig  . acetaminophen (TYLENOL) 500 MG tablet Take 500 mg by mouth every 6 (six) hours as needed for moderate pain or headache.  Marland Kitchen aspirin EC 81 MG tablet Take 1 tablet (81 mg total) by mouth daily.  Marland Kitchen atorvastatin (LIPITOR) 80 MG tablet Take 1 tablet (80 mg total) by mouth daily at 6 PM.  . Cetirizine HCl 10 MG CAPS Take 1 capsule (10 mg total) by mouth daily for 10 days.  .  cyclobenzaprine (FLEXERIL) 10 MG tablet TAKE 1 TABLET(10 MG) BY MOUTH THREE TIMES DAILY AS NEEDED (Patient taking differently: Take 10 mg by mouth 3 (three) times daily as needed for muscle spasms. )  . hydroxychloroquine (PLAQUENIL) 200 MG tablet Take 300 mg by mouth daily.   Marland Kitchen loperamide (IMODIUM A-D) 2 MG tablet Take 1 tablet (2 mg total) by mouth 4 (four) times daily as needed for diarrhea or loose stools.  Marland Kitchen losartan (COZAAR) 100 MG tablet Take 1 tablet (100 mg total) by mouth daily.  Marland Kitchen  metoprolol tartrate (LOPRESSOR) 25 MG tablet Take 1 tablet (25 mg total) by mouth 2 (two) times daily.  . Multiple Vitamin (MULTIVITAMIN) tablet Take 1 tablet by mouth daily.  . nitroGLYCERIN (NITROSTAT) 0.4 MG SL tablet PLACE 1 TABLET (0.4 MG TOTAL) UNDER THE TONGUE EVERY 5 (FIVE) MINUTES AS NEEDED FOR CHEST PAIN.  Marland Kitchen pantoprazole (PROTONIX) 20 MG tablet Take 1 tablet (20 mg total) by mouth daily.  Marland Kitchen spironolactone (ALDACTONE) 25 MG tablet Take 1 tablet (25 mg total) by mouth daily.  . ticagrelor (BRILINTA) 90 MG TABS tablet Take 1 tablet (90 mg total) by mouth 2 (two) times daily.  . [EXPIRED] predniSONE (DELTASONE) 50 MG tablet Take 1 tablet (50 mg total) by mouth daily for 5 days.  . [DISCONTINUED] cephALEXin (KEFLEX) 500 MG capsule Take 1 capsule (500 mg total) by mouth 4 (four) times daily.   No facility-administered encounter medications on file as of 08/18/2018.     Activities of Daily Living In your present state of health, do you have any difficulty performing the following activities: 08/18/2018 06/14/2018  Hearing? N N  Vision? N N  Difficulty concentrating or making decisions? N N  Walking or climbing stairs? N Y  Dressing or bathing? N N  Doing errands, shopping? N Y  Conservation officer, nature and eating ? N -  Using the Toilet? N -  In the past six months, have you accidently leaked urine? N -  Do you have problems with loss of bowel control? N -  Managing your Medications? N -  Managing your  Finances? N -  Housekeeping or managing your Housekeeping? N -  Some recent data might be hidden    Patient Care Team: Carollee Herter, Alferd Apa, DO as PCP - General (Family Medicine) Burnell Blanks, MD as PCP - Cardiology (Cardiology) Candee Furbish, MD as Referring Physician (Rheumatology) Inda Castle, MD (Inactive) as Consulting Physician (Gastroenterology) Melissa Noon, [place] as Referring Physician (Optometry)    Assessment:   This is a routine wellness examination for Ra[place]. Physical assessment deferred to PCP.  Exercise Activities and Dietary recommendations Current Exercise Habits: Home exercise routine, Type of exercise: stretching, Time (Minutes): 10, Frequency (Times/Week): 3, Weekly Exercise (Minutes/Week): 30, Intensity: Mild, Exercise limited by: None identified   Diet (meal preparation, eat out, water intake, caffeinated beverages, dairy products, fruits and vegetables): in general, an "unhealthy" diet, on average, 3 meals per day Educated on proper nutrition.   Goals    . Quit smoking / using tobacco       Fall Risk Fall Risk  08/18/2018 08/12/2017 07/25/2014  Falls in the past year? 0 No No    Depression Screen PHQ 2/9 Scores 08/18/2018 08/12/2017 02/11/2017 01/26/2016  PHQ - 2 Score 0 0 0 0     Cognitive Function Ad8 score reviewed for issues:  Issues making decisions:no  Less interest in hobbies / activities:no  Repeats questions, stories (family complaining):no  Trouble using ordinary gadgets (microwave, computer, phone):no  Forgets the month or year: no  Mismanaging finances: no  Remembering appts:no  Daily problems with thinking and/or memory:no Ad8 score is=0   MMSE - Mini Mental State Exam 08/12/2017  Orientation to time 5  Orientation to Place 5  Registration 3  Attention/ Calculation 5  Recall 1  Language- name 2 objects 2  Language- repeat 1  Language- follow 3 step command 3  Language- read & follow direction 1  Write a  sentence 1  Copy design 1  Total score  28        Immunization History  Administered Date(s) Administered  . Tdap 09/15/2012    Screening Tests Health Maintenance  Topic Date Due  . COLONOSCOPY  03/14/2017  . PAP SMEAR-Modifier  12/25/2018  . MAMMOGRAM  02/01/2019  . TETANUS/TDAP  09/16/2022  . Hepatitis C Screening  Completed  . HIV Screening  Completed       Plan:   See you next year!  Eat heart healthy diet (full of fruits, vegetables, whole grains, lean protein, water--limit salt, fat, and sugar intake) and increase physical activity as tolerated.  Continue doing brain stimulating activities (puzzles, reading, adult coloring books, staying active) to keep memory sharp.   Let us know when you are ready to schedule mammogram and bone density scan.  I have personally reviewed and noted the following in the patient's chart:   . Medical and social history . Use of alcohol, tobacco or illicit drugs  . Current medications and supplements . Functional ability and status . Nutritional status . Physical activity . Advanced directives . List of other physicians . Hospitalizations, surgeries, and ER visits in previous 12 months . Vitals . Screenings to include cognitive, depression, and falls . Referrals and appointments  In addition, I have reviewed and discussed with patient certain preventive protocols, quality metrics, and best practice recommendations. A written personalized care plan for preventive services as well as general preventive health recommendations were provided to patient.     Naaman Plummer [place], [place]  08/18/2018

## 2018-08-17 NOTE — Telephone Encounter (Signed)
Pt inactive in [facility].  Letter mailed requesting patient to return call regarding this by 08/21/2018. If no response, will discharge from cardiac rehab program.  

## 2018-08-18 ENCOUNTER — Encounter: Payer: Self-pay | Admitting: *Deleted

## 2018-08-18 ENCOUNTER — Ambulatory Visit: Payer: Medicare Other | Admitting: *Deleted

## 2018-08-18 ENCOUNTER — Encounter: Payer: Medicare Other | Admitting: Family Medicine

## 2018-08-18 ENCOUNTER — Other Ambulatory Visit: Payer: Self-pay

## 2018-08-18 ENCOUNTER — Ambulatory Visit (INDEPENDENT_AMBULATORY_CARE_PROVIDER_SITE_OTHER): Payer: Medicare Other | Admitting: *Deleted

## 2018-08-18 VITALS — BP 134/88 | HR 79

## 2018-08-18 DIAGNOSIS — Z0000 Encounter for general adult medical examination without abnormal findings: Secondary | ICD-10-CM

## 2018-08-18 NOTE — Patient Instructions (Signed)
See you next year!  Eat heart healthy diet (full of fruits, vegetables, whole grains, lean protein, water--limit salt, fat, and sugar intake) and increase physical activity as tolerated.  Continue doing brain stimulating activities (puzzles, reading, adult coloring books, staying active) to keep memory sharp.   Let us know when you are ready to schedule mammogram and bone density scan.   Samantha [name] , Thank you for taking time to come for your Medicare Wellness Visit. I appreciate your ongoing commitment to your health goals. Please review the following plan we discussed and let me know if I can assist you in the future.   These are the goals we discussed: Goals    . Quit smoking / using tobacco       This is a list of the screening recommended for you and due dates:  Health Maintenance  Topic Date Due  . Colon Cancer Screening  03/14/2017  . Pap Smear  12/25/2018  . Mammogram  02/01/2019  . Tetanus Vaccine  09/16/2022  .  Hepatitis C: One time screening is recommended by Center for Disease Control  (CDC) for  adults born from [birth year] through 1964.   Completed  . HIV Screening  Completed    Health Maintenance After Age 42 After age 39, you are at a higher risk for certain long-term diseases and infections as well as injuries from falls. Falls are a major cause of broken bones and head injuries in people who are older than age 26. Getting regular preventive care can help to keep you healthy and well. Preventive care includes getting regular testing and making lifestyle changes as recommended by your health care provider. Talk with your health care provider about:  Which screenings and tests you should have. A screening is a test that checks for a disease when you have no symptoms.  A diet and exercise plan that is right for you. What should I know about screenings and tests to prevent falls? Screening and testing are the best ways to find a health problem early. Early diagnosis and  treatment give you the best chance of managing medical conditions that are common after age 51. Certain conditions and lifestyle choices may make you more likely to have a fall. Your health care provider may recommend:  Regular vision checks. Poor vision and conditions such as cataracts can make you more likely to have a fall. If you wear glasses, make sure to get your prescription updated if your vision changes.  Medicine review. Work with your health care provider to regularly review all of the medicines you are taking, including over-the-counter medicines. Ask your health care provider about any side effects that may make you more likely to have a fall. Tell your health care provider if any medicines that you take make you feel dizzy or sleepy.  Osteoporosis screening. Osteoporosis is a condition that causes the bones to get weaker. This can make the bones weak and cause them to break more easily.  Blood pressure screening. Blood pressure changes and medicines to control blood pressure can make you feel dizzy.  Strength and balance checks. Your health care provider may recommend certain tests to check your strength and balance while standing, walking, or changing positions.  Foot health exam. Foot pain and numbness, as well as not wearing proper footwear, can make you more likely to have a fall.  Depression screening. You may be more likely to have a fall if you have a fear of falling, feel emotionally low,  or feel unable to do activities that you used to do.  Alcohol use screening. Using too much alcohol can affect your balance and may make you more likely to have a fall. What actions can I take to lower my risk of falls? General instructions  Talk with your health care provider about your risks for falling. Tell your health care provider if: ? You fall. Be sure to tell your health care provider about all falls, even ones that seem minor. ? You feel dizzy, sleepy, or off-balance.  Take  over-the-counter and prescription medicines only as told by your health care provider. These include any supplements.  Eat a healthy diet and maintain a healthy weight. A healthy diet includes low-fat dairy products, low-fat (lean) meats, and fiber from whole grains, beans, and lots of fruits and vegetables. Home safety  Remove any tripping hazards, such as rugs, cords, and clutter.  Install safety equipment such as grab bars in bathrooms and safety rails on stairs.  Keep rooms and walkways well-lit. Activity   Follow a regular exercise program to stay fit. This will help you maintain your balance. Ask your health care provider what types of exercise are appropriate for you.  If you need a cane or walker, use it as recommended by your health care provider.  Wear supportive shoes that have nonskid soles. Lifestyle  Do not drink alcohol if your health care provider tells you not to drink.  If you drink alcohol, limit how much you have: ? 0-1 drink a day for women. ? 0-2 drinks a day for men.  Be aware of how much alcohol is in your drink. In the [place] one drink equals one typical bottle of beer (12 oz), one-half glass of wine (5 oz), or one shot of hard liquor (1 oz).  Do not use any products that contain nicotine or tobacco, such as cigarettes and e-cigarettes. If you need help quitting, ask your health care provider. Summary  Having a healthy lifestyle and getting preventive care can help to protect your health and wellness after age 50.  Screening and testing are the best way to find a health problem early and help you avoid having a fall. Early diagnosis and treatment give you the best chance for managing medical conditions that are more common for people who are older than age 15.  Falls are a major cause of broken bones and head injuries in people who are older than age 85. Take precautions to prevent a fall at home.  Work with your health care provider to learn what changes  you can make to improve your health and wellness and to prevent falls. This information is not intended to replace advice given to you by your health care provider. Make sure you discuss any questions you have with your health care provider. Document Released: 11/27/2016 Document Revised: 05/07/2018 Document Reviewed: 11/27/2016 Elsevier Patient Education  2020 [employer].

## 2018-08-19 ENCOUNTER — Telehealth: Payer: Self-pay

## 2018-08-19 NOTE — Telephone Encounter (Signed)
Covid-19 screening questions   Do you now or have you had a fever in the last 14 days? No   Do you have any respiratory symptoms of shortness of breath or cough now or in the last 14 days? no  Do you have any family members or close contacts with diagnosed or suspected Covid-19 in the past 14 days? No   Have you been tested for Covid-19 and found to be positive? no

## 2018-08-20 ENCOUNTER — Ambulatory Visit (INDEPENDENT_AMBULATORY_CARE_PROVIDER_SITE_OTHER): Payer: Medicare Other | Admitting: Gastroenterology

## 2018-08-20 ENCOUNTER — Encounter: Payer: Self-pay | Admitting: Gastroenterology

## 2018-08-20 VITALS — BP 98/62 | HR 71 | Ht 68.0 in | Wt 146.2 lb

## 2018-08-20 DIAGNOSIS — R933 Abnormal findings on diagnostic imaging of other parts of digestive tract: Secondary | ICD-10-CM

## 2018-08-20 DIAGNOSIS — R1031 Right lower quadrant pain: Secondary | ICD-10-CM

## 2018-08-20 NOTE — Progress Notes (Signed)
[facility] Progress Note  Chief Complaint: Abnormal GI imaging  Subjective  History: My detailed telemedicine note from early June outlines this patient's hospitalizations in the last few months.  MI with right-sided cardiogenic shock, catheterization with drug-eluting stent placement, subsequent admission for E. coli bacteremia with question of right colon abnormality on noncontrast CT scan as possible source.  No colonoscopy done due to high risk, and it was felt she may have had ischemia to the right colon in the setting of cardiogenic shock, leading to bacterial translocation.  Patient was steadily improving at the time of her telemedicine appointment with me.  She has continued to improve since hospital discharge.  She has occasional "hunger pains" that are brief and usually in the mid abdomen.  Bowel habits are regular without rectal bleeding.  Appetite has turned to normal and weight is stable.   ROS: Cardiovascular:  no chest pain Respiratory: no dyspnea  The patient's Past Medical, Family and Social History were reviewed and are on file in the EMR.  Objective:  Med list reviewed  Current Outpatient Medications:  .  acetaminophen (TYLENOL) 500 MG tablet, Take 500 mg by mouth every 6 (six) hours as needed for moderate pain or headache., Disp: , Rfl:  .  aspirin EC 81 MG tablet, Take 1 tablet (81 mg total) by mouth daily., Disp: , Rfl:  .  atorvastatin (LIPITOR) 80 MG tablet, Take 1 tablet (80 mg total) by mouth daily at 6 PM., Disp: 90 tablet, Rfl: 3 .  Cetirizine HCl 10 MG CAPS, Take 1 capsule (10 mg total) by mouth daily for 10 days., Disp: 10 capsule, Rfl: 0 .  cyclobenzaprine (FLEXERIL) 10 MG tablet, TAKE 1 TABLET(10 MG) BY MOUTH THREE TIMES DAILY AS NEEDED (Patient taking differently: Take 10 mg by mouth 3 (three) times daily as needed for muscle spasms. ), Disp: 30 tablet, Rfl: 0 .  hydroxychloroquine (PLAQUENIL) 200 MG tablet, Take 300 mg by mouth daily. , Disp:  , Rfl:  .  loperamide (IMODIUM A-D) 2 MG tablet, Take 1 tablet (2 mg total) by mouth 4 (four) times daily as needed for diarrhea or loose stools., Disp: 30 tablet, Rfl: 0 .  losartan (COZAAR) 100 MG tablet, Take 1 tablet (100 mg total) by mouth daily., Disp: 90 tablet, Rfl: 3 .  metoprolol tartrate (LOPRESSOR) 25 MG tablet, Take 1 tablet (25 mg total) by mouth 2 (two) times daily., Disp: 180 tablet, Rfl: 3 .  Multiple Vitamin (MULTIVITAMIN) tablet, Take 1 tablet by mouth daily., Disp: , Rfl:  .  nitroGLYCERIN (NITROSTAT) 0.4 MG SL tablet, PLACE 1 TABLET (0.4 MG TOTAL) UNDER THE TONGUE EVERY 5 (FIVE) MINUTES AS NEEDED FOR CHEST PAIN., Disp: 25 tablet, Rfl: 2 .  pantoprazole (PROTONIX) 20 MG tablet, Take 1 tablet (20 mg total) by mouth daily., Disp: 90 tablet, Rfl: 3 .  spironolactone (ALDACTONE) 25 MG tablet, Take 1 tablet (25 mg total) by mouth daily., Disp: 90 tablet, Rfl: 3 .  ticagrelor (BRILINTA) 90 MG TABS tablet, Take 1 tablet (90 mg total) by mouth 2 (two) times daily., Disp: 180 tablet, Rfl: 3   Vital signs in last 24 hrs: Vitals:   08/20/18 1000  BP: 98/62  Pulse: 71    Physical Exam   HEENT: sclera anicteric, oral mucosa moist without lesions  Neck: supple, no thyromegaly, JVD or lymphadenopathy  Cardiac: RRR without murmurs, S1S2 heard, no peripheral edema  Pulm: clear to auscultation bilaterally, normal RR and effort noted  Abdomen: soft, RLQ and RUQ tenderness, with active bowel sounds. No guarding or palpable hepatosplenomegaly.  Skin; warm and dry, no jaundice or rash   @ASSESSMENTPLANBEGIN @ Assessment: Encounter Diagnoses  Name Primary?  . Abnormal finding on GI tract imaging Yes  . RLQ abdominal pain    I explained to her what we thought may have happened, with transient right colon ischemia in the setting of hypotension and then bacterial translocation with bacteremia.  She seems to be feeling well overall.  She does not really seem to be having chronic  abdominal pain, but was somewhat tender in the right lower quadrant on exam.  I reviewed the original CT scan, which was noncontrast, thus any questionable right colon findings were subtle at best.  Nevertheless, I would like to follow-up on it to make sure there is nothing requiring colonoscopy at this point.  Plan: CT abdomen and pelvis with oral and IV contrast.   Total time 21 minutes, over half spent face-to-face with patient in counseling and coordination of care.   Nelida Meuse III

## 2018-08-20 NOTE — Patient Instructions (Signed)
If you are age 57 or older, your body mass index should be between 23-30. Your Body mass index is 22.24 kg/m. If this is out of the aforementioned range listed, please consider follow up with your Primary Care Provider.  If you are age 64 or younger, your body mass index should be between 19-25. Your Body mass index is 22.24 kg/m. If this is out of the aformentioned range listed, please consider follow up with your Primary Care Provider.   You have been scheduled for a CT scan of the abdomen and pelvis at [facility] ([address]---this is in the same building as [facility]).   You are scheduled on 09/03/2018 at 1:00. You should arrive 15 minutes prior to your appointment time for registration. Please follow the written instructions below on the day of your exam:  WARNING: IF YOU ARE ALLERGIC TO IODINE/X-RAY DYE, PLEASE NOTIFY RADIOLOGY IMMEDIATELY AT [phone]! YOU WILL BE GIVEN A 13 HOUR PREMEDICATION PREP.  1) Do not eat or drink anything after 9:00 am (4 hours prior to your test) 2) You have been given 2 bottles of oral contrast to drink. The solution may taste better if refrigerated, but do NOT add ice or any other liquid to this solution. Shake well before drinking.    Drink 1 bottle of contrast @ 11:00 am (2 hours prior to your exam)  Drink 1 bottle of contrast @ 12:00 pm (1 hour prior to your exam)  You may take any medications as prescribed with a small amount of water, if necessary. If you take any of the following medications: METFORMIN, GLUCOPHAGE, GLUCOVANCE, AVANDAMET, RIOMET, FORTAMET, ACTOPLUS MET, JANUMET, GLUMETZA or METAGLIP, you MAY be asked to HOLD this medication 48 hours AFTER the exam.  The purpose of you drinking the oral contrast is to aid in the visualization of your intestinal tract. The contrast solution may cause some diarrhea. Depending on your individual set of symptoms, you may also receive an intravenous injection of x-ray contrast/dye.  Plan on being at  [facility]Care for 30 minutes or longer, depending on the type of exam you are having performed.  This test typically takes 30-45 minutes to complete.  If you have any questions regarding your exam or if you need to reschedule, you may call the CT department at [phone] between the hours of 8:00 am and 5:00 pm, Monday-Friday.  It was a pleasure to see you today!  Dr. Danis ________________________________________________________________________   

## 2018-08-21 ENCOUNTER — Other Ambulatory Visit (HOSPITAL_COMMUNITY): Payer: Self-pay | Admitting: *Deleted

## 2018-08-24 ENCOUNTER — Encounter (HOSPITAL_COMMUNITY): Payer: Medicare Other

## 2018-08-24 ENCOUNTER — Other Ambulatory Visit: Payer: Self-pay

## 2018-08-24 ENCOUNTER — Telehealth: Payer: Self-pay | Admitting: Physician Assistant

## 2018-08-24 ENCOUNTER — Ambulatory Visit (HOSPITAL_COMMUNITY)
Admission: RE | Admit: 2018-08-24 | Discharge: 2018-08-24 | Disposition: A | Discharge status: Home / Self Care | Payer: Medicare Other | Source: Ambulatory Visit | Attending: Internal Medicine | Admitting: Internal Medicine

## 2018-08-24 DIAGNOSIS — G6181 Chronic inflammatory demyelinating polyneuritis: Secondary | ICD-10-CM | POA: Diagnosis not present

## 2018-08-24 DIAGNOSIS — G609 Hereditary and idiopathic neuropathy, unspecified: Secondary | ICD-10-CM | POA: Diagnosis not present

## 2018-08-24 DIAGNOSIS — M329 Systemic lupus erythematosus, unspecified: Secondary | ICD-10-CM | POA: Diagnosis not present

## 2018-08-24 MED ORDER — ACETAMINOPHEN 325 MG PO TABS
ORAL_TABLET | ORAL | Status: AC
  Filled 2018-08-24: qty 2

## 2018-08-24 MED ORDER — DIPHENHYDRAMINE HCL 25 MG PO CAPS
ORAL_CAPSULE | ORAL | Status: AC
  Filled 2018-08-24: qty 1

## 2018-08-24 MED ORDER — IMMUNE GLOBULIN (HUMAN) 20 GM/200ML IV SOLN
70.0000 g | Freq: Once | INTRAVENOUS | Status: DC
  Administered 2018-08-24: 70 g via INTRAVENOUS
  Filled 2018-08-24: qty 100

## 2018-08-24 MED ORDER — ACETAMINOPHEN 325 MG PO TABS
650.0000 mg | ORAL_TABLET | Freq: Once | ORAL | Status: DC
  Administered 2018-08-24: 650 mg via ORAL

## 2018-08-24 MED ORDER — DIPHENHYDRAMINE HCL 25 MG PO CAPS
25.0000 mg | ORAL_CAPSULE | Freq: Once | ORAL | Status: DC
  Administered 2018-08-24: 25 mg via ORAL

## 2018-08-24 NOTE — Telephone Encounter (Signed)
New Message ° ° ° °Left message to confirm appt and answer covid questions  °

## 2018-08-24 NOTE — Progress Notes (Signed)
Pt completed IVIG infusion at 1145.  Instructed pt need tostay for 30 minutes after infusion per protocol.  She did not want to stay and requested to leave.  She left the unit at 1200

## 2018-08-24 NOTE — Progress Notes (Signed)
Cardiology Office Note    Date:  08/25/2018   ID:  Samantha [name], DOB [birth year], MRN [id number]  PCP:  Samantha [name], Grayling Congress, DO  Cardiologist: Samantha Carrow, MD EPS: None  Chief Complaint  Patient presents with  . Follow-up    History of Present Illness:  Samantha [name] is a 58 y.o. female with history of Lupus, HTN, tobacco abuse who presented 06/03/18  With abdominal pain. CTA negative for dissection and troponins >65. When placed on the cath table radiology called with concern of hematoma in ascending aorta wall so Dr. Tyrone [name] did intraop TEE which did not show aortic dissection. CAD S/P inf STEMI treated with DES prox and distal RCA with significant thrombus in mid RCA and distal RCA branches, mild non-obstructive disease in LAD with intra myocardial bridging in mid distal LAD. She initially developed cardiogenic shock requiring pressers but eventually weaned.   Patient had sepsis with E. coli bacteremia likely secondary to colitis 05/2018.  I had a telemedicine visit with the patient 6/10 at which time she had a lot of confusion over her medications and I restarted losartan for elevated blood pressures and increased Aldactone. Last visit 07/21/18 having to take a deep breath with brilinta  Patient comes in today for f/u. Feeling much better. Doing house work. Walking a little at night but not much.  Denies chest pain, shortness of breath, palpitations, dizziness or presyncope.  Scheduled for a CT of the abdomen 8/6 and a CT of her chest 09/15/2018.   Past Medical History:  Diagnosis Date  . Altered mental status   . Backache, unspecified   . Gastric ulcer, unspecified as acute or chronic, without mention of hemorrhage, perforation, or obstruction   . Helicobacter pylori (H. pylori)   . Hyperlipidemia   . Lupus (HCC)   . STEMI (ST elevation myocardial infarction) (HCC)    PCI to p/d RCA  . Tobacco abuse   . Tobacco use disorder 11/22/2011  . Unspecified essential  hypertension     Past Surgical History:  Procedure Laterality Date  . CORONARY/GRAFT ACUTE MI REVASCULARIZATION N/A 05/30/2018   Procedure: Coronary/Graft Acute MI Revascularization;  Surgeon: Samantha Hazel, MD;  Location: [facility];  Service: Cardiovascular;  Laterality: N/A;  . INTRAOPERATIVE TRANSESOPHAGEAL ECHOCARDIOGRAM N/A 05/30/2018   Procedure: Transesophageal Echocardiogram;  Surgeon: Samantha Ovens, MD;  Location: [facility] OR;  Service: Vascular;  Laterality: N/A;  . LEFT HEART CATH AND CORONARY ANGIOGRAPHY N/A 05/30/2018   Procedure: LEFT HEART CATH AND CORONARY ANGIOGRAPHY;  Surgeon: Samantha Hazel, MD;  Location: [facility];  Service: Cardiovascular;  Laterality: N/A;  . UPPER GASTROINTESTINAL ENDOSCOPY  2013    Current Medications: Current Meds  Medication Sig  . acetaminophen (TYLENOL) 500 MG tablet Take 500 mg by mouth every 6 (six) hours as needed for moderate pain or headache.  Samantha [name] aspirin EC 81 MG tablet Take 1 tablet (81 mg total) by mouth daily.  Samantha [name] atorvastatin (LIPITOR) 80 MG tablet Take 1 tablet (80 mg total) by mouth daily at 6 PM.  . Cetirizine HCl 10 MG CAPS Take 1 capsule (10 mg total) by mouth daily for 10 days.  . cyclobenzaprine (FLEXERIL) 10 MG tablet TAKE 1 TABLET(10 MG) BY MOUTH THREE TIMES DAILY AS NEEDED (Patient taking differently: Take 10 mg by mouth 3 (three) times daily as needed for muscle spasms. )  . hydroxychloroquine (PLAQUENIL) 200 MG tablet Take 300 mg by mouth daily.   Samantha [name]  loperamide (IMODIUM A-D) 2 MG tablet Take 1 tablet (2 mg total) by mouth 4 (four) times daily as needed for diarrhea or loose stools.  Samantha [name] losartan (COZAAR) 100 MG tablet Take 1 tablet (100 mg total) by mouth daily.  . metoprolol tartrate (LOPRESSOR) 25 MG tablet Take 1 tablet (25 mg total) by mouth 2 (two) times daily.  . Multiple Vitamin (MULTIVITAMIN) tablet Take 1 tablet by mouth daily.  . nitroGLYCERIN (NITROSTAT) 0.4 MG SL tablet PLACE 1 TABLET  (0.4 MG TOTAL) UNDER THE TONGUE EVERY 5 (FIVE) MINUTES AS NEEDED FOR CHEST PAIN.  Samantha [name] pantoprazole (PROTONIX) 20 MG tablet Take 1 tablet (20 mg total) by mouth daily.  Samantha [name] spironolactone (ALDACTONE) 25 MG tablet Take 1 tablet (25 mg total) by mouth daily.  . ticagrelor (BRILINTA) 90 MG TABS tablet Take 1 tablet (90 mg total) by mouth 2 (two) times daily.     Allergies:   Tramadol   Social History   Socioeconomic History  . Marital status: Single    Spouse name: Not on file  . Number of children: Not on file  . Years of education: Not on file  . Highest education level: Not on file  Occupational History  . Occupation: permanant disability    Employer: [employer]  Social Needs  . Financial resource strain: Not on file  . Food insecurity    Worry: Not on file    Inability: Not on file  . Transportation needs    Medical: Not on file    Non-medical: Not on file  Tobacco Use  . Smoking status: Former Smoker    Packs/day: 0.75    Years: 40.00    Pack years: 30.00    Types: Cigarettes  . Smokeless tobacco: Never Used  . Tobacco comment: quit 05/2018  Substance and Sexual Activity  . Alcohol use: Yes    Comment: occassional; beer   . Drug use: No  . Sexual activity: Not Currently    Partners: Male  Lifestyle  . Physical activity    Days per week: Not on file    Minutes per session: Not on file  . Stress: Not on file  Relationships  . Social Musician on phone: Not on file    Gets together: Not on file    Attends religious service: Not on file    Active member of club or organization: Not on file    Attends meetings of clubs or organizations: Not on file    Relationship status: Not on file  Other Topics Concern  . Not on file  Social History Narrative   Exercise--- walking     Family History:  The patient's family history includes Dementia in her mother; Diabetes in her sister; Hypertension in her mother.   ROS:   Please see the history of present illness.     ROS All other systems reviewed and are negative.   PHYSICAL EXAM:   VS:  BP 90/62   Pulse 82   Ht 5\' 8"  (1.727 m)   Wt 143 lb (64.9 kg)   LMP 04/11/2011   SpO2 98%   BMI 21.74 kg/m   Physical Exam  GEN: Well nourished, well developed, in no acute distress  Neck: no JVD, carotid bruits, or masses Cardiac:RRR; no murmurs, rubs, or gallops  Respiratory:  clear to auscultation bilaterally, normal work of breathing GI: soft, nontender, nondistended, + BS Ext: without cyanosis, clubbing, or edema, Good distal pulses bilaterally Neuro:  Alert and  Oriented x 3 Psych: euthymic mood, full affect  Wt Readings from Last 3 Encounters:  08/25/18 143 lb (64.9 kg)  08/24/18 140 lb (63.5 kg)  08/20/18 146 lb 4 oz (66.3 kg)      Studies/Labs Reviewed:   EKG:  EKG is not ordered today.   Recent Labs: 06/16/2018: Magnesium 2.0 07/21/2018: ALT 38 08/10/2018: BUN 13; Creatinine, Ser 0.74; Potassium 4.1; Sodium 139 08/11/2018: Hemoglobin 12.6; Platelets 173.0   Lipid Panel    Component Value Date/Time   CHOL 168 05/30/2018 1107   TRIG 99 05/30/2018 1107   HDL 64 05/30/2018 1107   CHOLHDL 2.6 05/30/2018 1107   VLDL 20 05/30/2018 1107   LDLCALC NOT CALCULATED 05/30/2018 1107   LDLDIRECT 141.8 09/15/2012 1059    Additional studies/ records that were reviewed today include:    Cath: 05/30/18    Mid LAD lesion is 20% stenosed.  Ost LAD to Prox LAD lesion is 20% stenosed.  Prox RCA lesion is 100% stenosed.  Dist RCA lesion is 90% stenosed.  A drug-eluting stent was successfully placed using a STENT SYNERGY DES 3X24.  Post intervention, there is a 0% residual stenosis.  A drug-eluting stent was successfully placed using a STENT SYNERGY DES 2.75X20.  Post intervention, there is a 0% residual stenosis.   1. 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 Successful PTCA/aspiration thrombectomy and drug eluting stent placement x 1 in the proximal RCA  and drug eluting stent placement x 1 in the distal RCA. Significant thrombus burden remains in the mid RCA and distal RCA branches.  3. Mild non-obstructive disease in the LAD. 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 Cardiogenic shock   TTE: 05/31/18   IMPRESSIONS      1. The left ventricle has low normal systolic function, with an ejection fraction of 50-55%. The cavity size was normal. Left ventricular diastolic Doppler parameters are consistent with impaired relaxation.  2. LVEF is approximately 50 to 55% with severe hypokinesis of the inferor/inferoseptal walls.  3. The right ventricle has mildly reduced systolic function. The cavity was mildly enlarged. There is no increase in right ventricular wall thickness.  4. The inferior vena cava was dilated in size with <50% respiratory variability.  5. The interatrial septum was not assessed. _____________Cath: 05/30/18    Mid LAD lesion is 20% stenosed.  Ost LAD to Prox LAD lesion is 20% stenosed.  Prox RCA lesion is 100% stenosed.  Dist RCA lesion is 90% stenosed.  A drug-eluting stent was successfully placed using a STENT SYNERGY DES 3X24.  Post intervention, there is a 0% residual stenosis.  A drug-eluting stent was successfully placed using a STENT SYNERGY DES 2.75X20.  Post intervention, there is a 0% residual stenosis.   1. 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 Successful PTCA/aspiration thrombectomy and drug eluting stent placement x 1 in the proximal RCA and drug eluting stent placement x 1 in the distal RCA. Significant thrombus burden remains in the mid RCA and distal RCA branches.  3. Mild non-obstructive disease in the LAD. 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 Cardiogenic shock   TTE: 05/31/18   IMPRESSIONS      1. The left ventricle has low normal systolic function, with an ejection fraction of 50-55%. The cavity size was  normal. Left ventricular diastolic Doppler parameters are consistent with impaired relaxation.  2. LVEF is approximately 50  to 55% with severe hypokinesis of the inferor/inferoseptal walls.  3. The right ventricle has mildly reduced systolic function. The cavity was mildly enlarged. There is no increase in right ventricular wall thickness.  4. The inferior vena cava was dilated in size with <50% respiratory variability.  5. The interatrial septum was not assessed. _____________   Chest CT 5/6/2020IMPRESSION: Soft tissue again noted surrounding the distal ascending thoracic aorta and proximal aortic arch. This soft tissue now extends adjacent to the main pulmonary artery and measures fluid density. This raises the possibility of fluid within a pericardial recess. There is a small amount of pericardial fluid posterior to the left heart border.     Chest CT 5/6/2020IMPRESSION: Soft tissue again noted surrounding the distal ascending thoracic aorta and proximal aortic arch. This soft tissue now extends adjacent to the main pulmonary artery and measures fluid density. This raises the possibility of fluid within a pericardial recess. There is a small amount of pericardial fluid posterior to the left heart border.     ASSESSMENT:    1. Coronary artery disease involving native coronary artery of native heart without angina pectoris   2. Abnormal chest CT   3. Essential hypertension   4. Hyperlipidemia LDL goal <70      PLAN:  In order of problems listed above:  CAD status post inferior STEMI 05/2018 treated with DES to the proximal RCA and distal RCA complicated by cardiogenic shock.  On Brilinta and aspirin.gradually increase activity. F/u with Dr. Clifton James 8/27 3:20  Abnormal chest CT with soft tissue around the distal ascending aorta and proximal aortic arch.  Plan for follow-up CT 08/2018 with Dr. Tyrone [name]  Essential hypertension blood pressure running low here but has been in the  118-130 range at home.  Continue current medication for now but if she continues to run below 100 we can decrease losartan.  Hyperlipidemia on high-dose statin f/u FLP in August  Medication Adjustments/Labs and Tests Ordered: Current medicines are reviewed at length with the patient today.  Concerns regarding medicines are outlined above.  Medication changes, Labs and Tests ordered today are listed in the Patient Instructions below. Patient Instructions  Medication Instructions:  Your physician recommends that you continue on your current medications as directed. Please refer to the Current Medication list given to you today.  If you need a refill on your cardiac medications before your next appointment, please call your pharmacy.   Lab work: You are schedule for labs on 09/15/2018 (Our lab is open from 7:30 AM to 4:30 PM)  If you have labs (blood work) drawn today and your tests are completely normal, you will receive your results only by: Samantha [name] MyChart Message (if you have MyChart) OR . A paper copy in the mail If you have any lab test that is abnormal or we need to change your treatment, we will call you to review the results.  Testing/Procedures: None   Follow-Up: You are scheduled to see Dr. Clifton James on 09/24/2018  Any Other Special Instructions Will Be Listed Below (If Applicable).       Signed, Jacolyn Reedy, PA-C  08/25/2018 1:01 PM    [facility] [address] [place], [place], [place]  [address] Phone: [phone]; Fax: [phone]

## 2018-08-25 ENCOUNTER — Encounter (HOSPITAL_COMMUNITY): Payer: Medicare Other

## 2018-08-25 ENCOUNTER — Ambulatory Visit (INDEPENDENT_AMBULATORY_CARE_PROVIDER_SITE_OTHER): Payer: Medicare Other | Admitting: Physician Assistant

## 2018-08-25 ENCOUNTER — Encounter: Payer: Self-pay | Admitting: Physician Assistant

## 2018-08-25 ENCOUNTER — Ambulatory Visit: Payer: Medicare Other | Admitting: Family Medicine

## 2018-08-25 ENCOUNTER — Other Ambulatory Visit: Payer: Self-pay

## 2018-08-25 VITALS — BP 90/62 | HR 82 | Ht 68.0 in | Wt 143.0 lb

## 2018-08-25 DIAGNOSIS — I2510 Atherosclerotic heart disease of native coronary artery without angina pectoris: Secondary | ICD-10-CM

## 2018-08-25 DIAGNOSIS — E785 Hyperlipidemia, unspecified: Secondary | ICD-10-CM

## 2018-08-25 DIAGNOSIS — R9389 Abnormal findings on diagnostic imaging of other specified body structures: Secondary | ICD-10-CM | POA: Diagnosis not present

## 2018-08-25 DIAGNOSIS — I10 Essential (primary) hypertension: Secondary | ICD-10-CM

## 2018-08-25 NOTE — Patient Instructions (Addendum)
Medication Instructions:  Your physician recommends that you continue on your current medications as directed. Please refer to the Current Medication list given to you today.  If you need a refill on your cardiac medications before your next appointment, please call your pharmacy.   Lab work: You are schedule for labs on 09/15/2018 (Our lab is open from 7:30 AM to 4:30 PM)  If you have labs (blood work) drawn today and your tests are completely normal, you will receive your results only by: Marland Kitchen MyChart Message (if you have MyChart) OR . A paper copy in the mail If you have any lab test that is abnormal or we need to change your treatment, we will call you to review the results.  Testing/Procedures: None   Follow-Up: You are scheduled to see Dr. Angelena Form on 09/24/2018  Any Other Special Instructions Will Be Listed Below (If Applicable).

## 2018-08-25 NOTE — Telephone Encounter (Signed)

## 2018-08-27 ENCOUNTER — Other Ambulatory Visit: Payer: Medicare Other

## 2018-08-27 NOTE — Telephone Encounter (Signed)
Appointment has been made for 8/27

## 2018-08-31 ENCOUNTER — Other Ambulatory Visit: Payer: Self-pay

## 2018-08-31 ENCOUNTER — Ambulatory Visit (INDEPENDENT_AMBULATORY_CARE_PROVIDER_SITE_OTHER): Payer: Medicare Other | Admitting: Family Medicine

## 2018-08-31 ENCOUNTER — Encounter: Payer: Self-pay | Admitting: Family Medicine

## 2018-08-31 VITALS — BP 102/67 | HR 72 | Temp 98.4°F | Resp 18 | Ht 68.0 in | Wt 142.8 lb

## 2018-08-31 DIAGNOSIS — T63461A Toxic effect of venom of wasps, accidental (unintentional), initial encounter: Secondary | ICD-10-CM | POA: Insufficient documentation

## 2018-08-31 DIAGNOSIS — I2510 Atherosclerotic heart disease of native coronary artery without angina pectoris: Secondary | ICD-10-CM

## 2018-08-31 DIAGNOSIS — R591 Generalized enlarged lymph nodes: Secondary | ICD-10-CM | POA: Insufficient documentation

## 2018-08-31 NOTE — Patient Instructions (Signed)
Bee, Wasp, or [employer], Adult Bees, wasps, and hornets are part of a family of insects that can sting people. These stings can cause pain and inflammation, but they are usually not serious. However, some people may have an allergic reaction to a sting. This can cause the symptoms to be more severe. What increases the risk? You may be at a greater risk of getting stung if you:  Provoke a stinging insect by swatting or disturbing it.  Wear strong-smelling soaps, deodorants, or body sprays.  Spend time outdoors near gardens with flowers or fruit trees or in clothes that expose skin.  Eat or drink outside. What are the signs or symptoms? Common symptoms of this condition include:  A red lump in the skin that sometimes has a tiny hole in the center. In some cases, a stinger may be in the center of the wound.  Pain and itching at the sting site.  Redness and swelling around the sting site. If you have an allergic reaction (localized allergic reaction), the swelling and redness may spread out from the sting site. In some cases, this reaction can continue to develop over the next 24-48 hours. In rare cases, a person may have a severe allergic reaction (anaphylactic reaction) to a sting. Symptoms of an anaphylactic reaction may include:  Wheezing or difficulty breathing.  Raised, itchy, red patches on the skin (hives).  Nausea or vomiting.  Abdominal cramping.  Diarrhea.  Tightness in the chest or chest pain.  Dizziness or fainting.  Redness of the face (flushing).  Hoarse voice.  Swollen tongue, lips, or face. How is this diagnosed? This condition is usually diagnosed based on your symptoms and medical history as well as a physical exam. You may have an allergy test to determine if you are allergic to the substance that the insect injected during the sting (venom). How is this treated? If you were stung by a bee, the stinger and a small sac of venom may be in the wound. It is  important to remove the stinger as soon as possible. You can do this by brushing across the wound with gauze, a fingernail, or a flat card such as a credit card. Removing the stinger can help reduce the severity of your body's reaction to the sting. Most stings can be treated with:  Icing to reduce swelling in the area.  Medicines (antihistamines) to treat itching or an allergic reaction.  Medicines to help reduce pain. These may be medicines that you take by mouth, or medicated creams or lotions that you apply to your skin. Pay close attention to your symptoms after you have been stung. If possible, have someone stay with you to make sure you do not have an allergic reaction. If you have any signs of an allergic reaction, call your health care provider. If you have ever had a severe allergic reaction, your health care provider may give you an inhaler or injectable medicine (epinephrine auto-injector) to use if necessary. Follow these instructions at home:   Wash the sting site 2-3 times each day with soap and water as told by your health care provider.  Apply or take over-the-counter and prescription medicines only as told by your health care provider.  If directed, apply ice to the sting area. ? Put ice in a plastic bag. ? Place a towel between your skin and the bag. ? Leave the ice on for 20 minutes, 2-3 times a day.  Do not scratch the sting area.  If  you had a severe allergic reaction to a sting, you may need: ? To wear a medical bracelet or necklace that lists the allergy. ? To learn when and how to use an anaphylaxis kit or epinephrine injection. Your family members and coworkers may also need to learn this. ? To carry an anaphylaxis kit or epinephrine injection with you at all times. How is this prevented?  Avoid swatting at stinging insects and disturbing insect nests.  Do not use fragrant soaps or lotions.  Wear shoes, pants, and long sleeves when spending time outdoors,  especially in grassy areas where stinging insects are common.  Keep outdoor areas free from nests or hives.  Keep food and drink containers covered when eating outdoors.  Avoid working or sitting near flowering plants, if possible.  Wear gloves if you are gardening or working outdoors.  If an attack by a stinging insect or a swarm seems likely in the moment, move away from the area or find a barrier between you and the insect(s), such as a door. Contact a health care provider if:  Your symptoms do not get better in 2-3 days.  You have redness, swelling, or pain that spreads beyond the area of the sting.  You have a fever. Get help right away if: You have symptoms of a severe allergic reaction. These include:  Wheezing or difficulty breathing.  Tightness in the chest or chest pain.  Light-headedness or fainting.  Itchy, raised, red patches on the skin.  Nausea or vomiting.  Abdominal cramping.  Diarrhea.  A swollen tongue or lips, or trouble swallowing.  Dizziness or fainting. Summary  Stings from bees, wasps, and hornets can cause pain and inflammation, but they are usually not serious. However, some people may have an allergic reaction to a sting. This can cause the symptoms to be more severe.  Pay close attention to your symptoms after you have been stung. If possible, have someone stay with you to make sure you do not have an allergic reaction.  Call your health care provider if you have any signs of an allergic reaction. This information is not intended to replace advice given to you by your health care provider. Make sure you discuss any questions you have with your health care provider. Document Released: 01/14/2005 Document Revised: 01/09/2017 Document Reviewed: 03/21/2016 Elsevier Patient Education  2020 [employer]  

## 2018-08-31 NOTE — Assessment & Plan Note (Signed)
May have been from wasp sting abx finished and it has resolved rto prn

## 2018-08-31 NOTE — Progress Notes (Signed)
Patient ID: Samantha [name], female    DOB: [birth year]  Age: 58 y.o. MRN: [id number]    Subjective:  Subjective  HPI Samantha [name] presents for f/u wasp sting and lymphadenopathy.  Symptoms have completely resolved.  No complaints and abx are finished.    Review of Systems  Constitutional: Negative for appetite change, diaphoresis, fatigue and unexpected weight change.  Eyes: Negative for pain, redness and visual disturbance.  Respiratory: Negative for cough, chest tightness, shortness of breath and wheezing.   Cardiovascular: Negative for chest pain, palpitations and leg swelling.  Endocrine: Negative for cold intolerance, heat intolerance, polydipsia, polyphagia and polyuria.  Genitourinary: Negative for difficulty urinating, dysuria and frequency.  Skin: Negative for color change and rash.  Neurological: Negative for dizziness, light-headedness, numbness and headaches.    History Past Medical History:  Diagnosis Date  . Altered mental status   . Backache, unspecified   . Gastric ulcer, unspecified as acute or chronic, without mention of hemorrhage, perforation, or obstruction   . Helicobacter pylori (H. pylori)   . Hyperlipidemia   . Lupus ([place])   . STEMI (ST elevation myocardial infarction) ([place])    PCI to p/d RCA  . Tobacco abuse   . Tobacco use disorder 11/22/2011  . Unspecified essential hypertension     She has a past surgical history that includes Upper gastrointestinal endoscopy (2013); Coronary/Graft Acute MI Revascularization (N/A, 05/30/2018); LEFT HEART CATH AND CORONARY ANGIOGRAPHY (N/A, 05/30/2018); and Intraoprative transesophageal echocardiogram (N/A, 05/30/2018).   Her family history includes Dementia in her mother; Diabetes in her sister; Hypertension in her mother.She reports that she has quit smoking. Her smoking use included cigarettes. She has a 30.00 pack-year smoking history. She has never used smokeless tobacco. She reports current alcohol use. She  reports that she does not use drugs.  Current Outpatient Medications on File Prior to Visit  Medication Sig Dispense Refill  . acetaminophen (TYLENOL) 500 MG tablet Take 500 mg by mouth every 6 (six) hours as needed for moderate pain or headache.    Marland Kitchen aspirin EC 81 MG tablet Take 1 tablet (81 mg total) by mouth daily.    Marland Kitchen atorvastatin (LIPITOR) 80 MG tablet Take 1 tablet (80 mg total) by mouth daily at 6 PM. 90 tablet 3  . cyclobenzaprine (FLEXERIL) 10 MG tablet TAKE 1 TABLET(10 MG) BY MOUTH THREE TIMES DAILY AS NEEDED (Patient taking differently: Take 10 mg by mouth 3 (three) times daily as needed for muscle spasms. ) 30 tablet 0  . hydroxychloroquine (PLAQUENIL) 200 MG tablet Take 300 mg by mouth daily.     Marland Kitchen loperamide (IMODIUM A-D) 2 MG tablet Take 1 tablet (2 mg total) by mouth 4 (four) times daily as needed for diarrhea or loose stools. 30 tablet 0  . losartan (COZAAR) 100 MG tablet Take 1 tablet (100 mg total) by mouth daily. 90 tablet 3  . metoprolol tartrate (LOPRESSOR) 25 MG tablet Take 1 tablet (25 mg total) by mouth 2 (two) times daily. 180 tablet 3  . Multiple Vitamin (MULTIVITAMIN) tablet Take 1 tablet by mouth daily.    . nitroGLYCERIN (NITROSTAT) 0.4 MG SL tablet PLACE 1 TABLET (0.4 MG TOTAL) UNDER THE TONGUE EVERY 5 (FIVE) MINUTES AS NEEDED FOR CHEST PAIN. 25 tablet 2  . pantoprazole (PROTONIX) 20 MG tablet Take 1 tablet (20 mg total) by mouth daily. 90 tablet 3  . spironolactone (ALDACTONE) 25 MG tablet Take 1 tablet (25 mg total) by mouth daily. 90 tablet 3  .  ticagrelor (BRILINTA) 90 MG TABS tablet Take 1 tablet (90 mg total) by mouth 2 (two) times daily. 180 tablet 3  . Cetirizine HCl 10 MG CAPS Take 1 capsule (10 mg total) by mouth daily for 10 days. 10 capsule 0   No current facility-administered medications on file prior to visit.      Objective:  Objective  Physical Exam Vitals signs and nursing note reviewed.  Constitutional:      Appearance: She is  well-developed.  HENT:     Head: Normocephalic and atraumatic.  Eyes:     Conjunctiva/sclera: Conjunctivae normal.  Neck:     Musculoskeletal: Normal range of motion and neck supple.     Thyroid: No thyromegaly.     Vascular: No carotid bruit or JVD.  Cardiovascular:     Rate and Rhythm: Normal rate and regular rhythm.     Heart sounds: Normal heart sounds. No murmur.  Pulmonary:     Effort: Pulmonary effort is normal. No respiratory distress.     Breath sounds: Normal breath sounds. No wheezing or rales.  Chest:     Chest wall: No tenderness.  Lymphadenopathy:     Cervical: No cervical adenopathy.  Skin:    General: Skin is warm and dry.     Findings: No erythema or rash.  Neurological:     Mental Status: She is alert and oriented to person, place, and time.    BP 102/67 (BP Location: Left Arm, Patient Position: Sitting, Cuff Size: Normal)   Pulse 72   Temp 98.4 F (36.9 C) (Oral)   Resp 18   Ht 5\' 8"  (1.727 m)   Wt 142 lb 12.8 oz (64.8 kg)   LMP 04/11/2011   SpO2 99%   BMI 21.71 kg/m  Wt Readings from Last 3 Encounters:  08/31/18 142 lb 12.8 oz (64.8 kg)  08/25/18 143 lb (64.9 kg)  08/24/18 140 lb (63.5 kg)     Lab Results  Component Value Date   WBC 4.5 08/11/2018   HGB 12.6 08/11/2018   HCT 39.6 08/11/2018   PLT 173.0 08/11/2018   GLUCOSE 89 08/10/2018   CHOL 168 05/30/2018   TRIG 99 05/30/2018   HDL 64 05/30/2018   LDLDIRECT 141.8 09/15/2012   LDLCALC NOT CALCULATED 05/30/2018   ALT 38 (H) 07/21/2018   AST 33 07/21/2018   NA 139 08/10/2018   K 4.1 08/10/2018   CL 106 08/10/2018   CREATININE 0.74 08/10/2018   BUN 13 08/10/2018   CO2 20 08/10/2018   TSH 0.83 09/15/2012   INR 1.1 05/30/2018   HGBA1C 5.6 03/13/2011    No results found.   Assessment & Plan:  Plan  I am having Samantha [name]. Samantha [name] maintain her multivitamin, aspirin EC, acetaminophen, atorvastatin, pantoprazole, ticagrelor, hydroxychloroquine, cyclobenzaprine, loperamide,  spironolactone, metoprolol tartrate, losartan, nitroGLYCERIN, and Cetirizine HCl.  No orders of the defined types were placed in this encounter.   Problem List Items Addressed This Visit      Unprioritized   Lymphadenopathy    May have been from wasp sting abx finished and it has resolved rto prn      Wasp sting - Primary    Resolved Finished abx rto prn          Follow-up: Return if symptoms worsen or fail to improve.  [name], DO

## 2018-08-31 NOTE — Assessment & Plan Note (Signed)
Resolved Finished abx rto prn

## 2018-09-03 ENCOUNTER — Ambulatory Visit (INDEPENDENT_AMBULATORY_CARE_PROVIDER_SITE_OTHER)
Admission: RE | Admit: 2018-09-03 | Discharge: 2018-09-03 | Disposition: A | Discharge status: Home / Self Care | Payer: Medicare Other | Source: Ambulatory Visit | Attending: Gastroenterology | Admitting: Gastroenterology

## 2018-09-03 ENCOUNTER — Other Ambulatory Visit: Payer: Self-pay

## 2018-09-03 DIAGNOSIS — R933 Abnormal findings on diagnostic imaging of other parts of digestive tract: Secondary | ICD-10-CM | POA: Diagnosis not present

## 2018-09-03 DIAGNOSIS — N281 Cyst of kidney, acquired: Secondary | ICD-10-CM | POA: Diagnosis not present

## 2018-09-03 DIAGNOSIS — N852 Hypertrophy of uterus: Secondary | ICD-10-CM | POA: Diagnosis not present

## 2018-09-03 DIAGNOSIS — R1031 Right lower quadrant pain: Secondary | ICD-10-CM | POA: Diagnosis not present

## 2018-09-03 DIAGNOSIS — K5730 Diverticulosis of large intestine without perforation or abscess without bleeding: Secondary | ICD-10-CM | POA: Diagnosis not present

## 2018-09-03 MED ORDER — IOHEXOL 300 MG/ML  SOLN
100.0000 mL | Freq: Once | INTRAMUSCULAR | Status: AC | PRN
  Administered 2018-09-03: 100 mL via INTRAVENOUS

## 2018-09-07 ENCOUNTER — Other Ambulatory Visit: Payer: Self-pay | Admitting: Family Medicine

## 2018-09-07 DIAGNOSIS — I10 Essential (primary) hypertension: Secondary | ICD-10-CM

## 2018-09-08 DIAGNOSIS — N281 Cyst of kidney, acquired: Secondary | ICD-10-CM | POA: Diagnosis not present

## 2018-09-09 ENCOUNTER — Ambulatory Visit: Payer: Medicare Other | Admitting: Physician Assistant

## 2018-09-14 ENCOUNTER — Telehealth: Payer: Self-pay | Admitting: Cardiovascular Disease

## 2018-09-14 DIAGNOSIS — E785 Hyperlipidemia, unspecified: Secondary | ICD-10-CM

## 2018-09-14 NOTE — Telephone Encounter (Signed)
New message:     Patient calling stating some one called her she is returning the call, did not see a note.

## 2018-09-15 ENCOUNTER — Other Ambulatory Visit: Payer: Self-pay

## 2018-09-15 ENCOUNTER — Other Ambulatory Visit: Payer: Medicare Other

## 2018-09-15 ENCOUNTER — Ambulatory Visit (INDEPENDENT_AMBULATORY_CARE_PROVIDER_SITE_OTHER)
Admission: RE | Admit: 2018-09-15 | Discharge: 2018-09-15 | Disposition: A | Discharge status: Home / Self Care | Payer: Medicare Other | Source: Ambulatory Visit | Attending: Physician Assistant | Admitting: Physician Assistant

## 2018-09-15 DIAGNOSIS — E785 Hyperlipidemia, unspecified: Secondary | ICD-10-CM

## 2018-09-15 DIAGNOSIS — I2510 Atherosclerotic heart disease of native coronary artery without angina pectoris: Secondary | ICD-10-CM

## 2018-09-15 DIAGNOSIS — R9389 Abnormal findings on diagnostic imaging of other specified body structures: Secondary | ICD-10-CM

## 2018-09-15 DIAGNOSIS — J439 Emphysema, unspecified: Secondary | ICD-10-CM | POA: Diagnosis not present

## 2018-09-15 DIAGNOSIS — R740 Nonspecific elevation of levels of transaminase and lactic acid dehydrogenase [LDH]: Secondary | ICD-10-CM | POA: Diagnosis not present

## 2018-09-15 DIAGNOSIS — R7401 Elevation of levels of liver transaminase levels: Secondary | ICD-10-CM

## 2018-09-15 LAB — BASIC METABOLIC PANEL
BUN/Creatinine Ratio: 23 (ref 9–23)
BUN: 22 mg/dL (ref 6–24)
CO2: 21 mmol/L (ref 20–29)
Calcium: 9.4 mg/dL (ref 8.7–10.2)
Chloride: 100 mmol/L (ref 96–106)
Creatinine, Ser: 0.97 mg/dL (ref 0.57–1.00)
GFR calc Af Amer: 75 mL/min/{1.73_m2} (ref >59)
GFR calc non Af Amer: 65 mL/min/{1.73_m2} (ref >59)
Glucose: 89 mg/dL (ref 65–99)
Potassium: 4.9 mmol/L (ref 3.5–5.2)
Sodium: 135 mmol/L (ref 134–144)

## 2018-09-15 LAB — LIPID PANEL
Chol/HDL Ratio: 2.4 ratio (ref 0.0–4.4)
Cholesterol, Total: 166 mg/dL (ref 100–199)
HDL: 68 mg/dL (ref >39)
LDL Calculated: 73 mg/dL (ref 0–99)
Triglycerides: 125 mg/dL (ref 0–149)
VLDL Cholesterol Cal: 25 mg/dL (ref 5–40)

## 2018-09-15 LAB — HEPATIC FUNCTION PANEL
ALT: 28 IU/L (ref 0–32)
AST: 30 IU/L (ref 0–40)
Albumin: 4.4 g/dL (ref 3.8–4.9)
Alkaline Phosphatase: 69 IU/L (ref 39–117)
Bilirubin Total: 0.4 mg/dL (ref 0.0–1.2)
Bilirubin, Direct: 0.15 mg/dL (ref 0.00–0.40)
Total Protein: 7.2 g/dL (ref 6.0–8.5)

## 2018-09-15 MED ORDER — IOHEXOL 350 MG/ML SOLN
100.0000 mL | Freq: Once | INTRAVENOUS | Status: AC | PRN
  Administered 2018-09-15: 100 mL via INTRAVENOUS

## 2018-09-16 NOTE — Telephone Encounter (Signed)
Follow Up   Patient calling back stating that she missed another call. Please give patient a call back about CT results.

## 2018-09-16 NOTE — Telephone Encounter (Signed)
Follow Up   Patient calling back stating that she missed another call from [place] yesterday around 6 pm. Please give patient a call back.

## 2018-09-16 NOTE — Telephone Encounter (Signed)
-----   Message from Imogene Burn, PA-C sent at 09/15/2018 11:37 AM EDT ----- Fluid seen on prior CT has resolved and is otherwise stable. No changes

## 2018-09-16 NOTE — Telephone Encounter (Signed)
Called and made patient aware of results. Lipids scheduled for 11/19. Patient verbalized understanding and thanked me for the call.

## 2018-09-16 NOTE — Telephone Encounter (Signed)
-----   Message from Imogene Burn, PA-C sent at 09/16/2018 10:16 AM EDT ----- Labs all stable LDL 73 almost at goal. Continue same meds and repeat lipids in 3 months

## 2018-09-16 NOTE — Telephone Encounter (Signed)
Left message for patient to call back regarding lab and CT results

## 2018-09-23 NOTE — Progress Notes (Signed)
Chief Complaint  Patient presents with  . Follow-up    CAD   History of Present Illness: 58 yo female with history of lupus, CAD, HLD, HTN and tobacco abuse here today for cardiac follow up. She was admitted to [facility] May 2020 with an inferior STEMI treated with drug eluting stents in the proximal and distal RCA. Her initial presentation was initially worrisome for an aortic dissection based on her CTA but TEE did not show an aortic dissection. Chest CTA August 2020 with resolution of aortic fluid/soft tissue abnormality. No evidence of aortic dissection. Echo May 2020 with normal LV size and function.   She is here today for follow up. The patient denies any chest pain, dyspnea, palpitations, lower extremity edema, orthopnea, PND, dizziness, near syncope or syncope.    Primary Care Physician: Carollee Herter, Alferd Apa, DO  Past Medical History:  Diagnosis Date  . Altered mental status   . Backache, unspecified   . Gastric ulcer, unspecified as acute or chronic, without mention of hemorrhage, perforation, or obstruction   . Helicobacter pylori (H. pylori)   . Hyperlipidemia   . Lupus ([place])   . STEMI (ST elevation myocardial infarction) ([place])    PCI to p/d RCA  . Tobacco abuse   . Tobacco use disorder 11/22/2011  . Unspecified essential hypertension     Past Surgical History:  Procedure Laterality Date  . CORONARY/GRAFT ACUTE MI REVASCULARIZATION N/A 05/30/2018   Procedure: Coronary/Graft Acute MI Revascularization;  Surgeon: Burnell Blanks, MD;  Location: [facility];  Service: Cardiovascular;  Laterality: N/A;  . INTRAOPERATIVE TRANSESOPHAGEAL ECHOCARDIOGRAM N/A 05/30/2018   Procedure: Transesophageal Echocardiogram;  Surgeon: Grace Isaac, MD;  Location[facility];  Service: Vascular;  Laterality: N/A;  . LEFT HEART CATH AND CORONARY ANGIOGRAPHY N/A 05/30/2018   Procedure: LEFT HEART CATH AND CORONARY ANGIOGRAPHY;  Surgeon: Burnell Blanks, MD;  Location: [facility];  Service: Cardiovascular;  Laterality: N/A;  . UPPER GASTROINTESTINAL ENDOSCOPY  2013    Current Outpatient Medications  Medication Sig Dispense Refill  . acetaminophen (TYLENOL) 500 MG tablet Take 500 mg by mouth every 6 (six) hours as needed for moderate pain or headache.    Marland Kitchen aspirin EC 81 MG tablet Take 1 tablet (81 mg total) by mouth daily.    Marland Kitchen atorvastatin (LIPITOR) 80 MG tablet Take 1 tablet (80 mg total) by mouth daily at 6 PM. 90 tablet 3  . cyclobenzaprine (FLEXERIL) 10 MG tablet TAKE 1 TABLET(10 MG) BY MOUTH THREE TIMES DAILY AS NEEDED (Patient taking differently: Take 10 mg by mouth 3 (three) times daily as needed for muscle spasms. ) 30 tablet 0  . hydroxychloroquine (PLAQUENIL) 200 MG tablet Take 300 mg by mouth daily.     Marland Kitchen loperamide (IMODIUM A-D) 2 MG tablet Take 1 tablet (2 mg total) by mouth 4 (four) times daily as needed for diarrhea or loose stools. 30 tablet 0  . metoprolol tartrate (LOPRESSOR) 25 MG tablet Take 1 tablet (25 mg total) by mouth 2 (two) times daily. 180 tablet 3  . Multiple Vitamin (MULTIVITAMIN) tablet Take 1 tablet by mouth daily.    . nitroGLYCERIN (NITROSTAT) 0.4 MG SL tablet PLACE 1 TABLET (0.4 MG TOTAL) UNDER THE TONGUE EVERY 5 (FIVE) MINUTES AS NEEDED FOR CHEST PAIN. 25 tablet 2  . pantoprazole (PROTONIX) 20 MG tablet Take 1 tablet (20 mg total) by mouth daily. 90 tablet 3  . spironolactone (ALDACTONE) 25 MG tablet Take  1 tablet (25 mg total) by mouth daily. 90 tablet 3  . ticagrelor (BRILINTA) 90 MG TABS tablet Take 1 tablet (90 mg total) by mouth 2 (two) times daily. 180 tablet 3  . Cetirizine HCl 10 MG CAPS Take 1 capsule (10 mg total) by mouth daily for 10 days. 10 capsule 0  . losartan (COZAAR) 50 MG tablet Take 1 tablet (50 mg total) by mouth daily. 90 tablet 3   No current facility-administered medications for this visit.     Allergies  Allergen Reactions  . Tramadol Nausea Only    Social History   Socioeconomic  History  . Marital status: Single    Spouse name: Not on file  . Number of children: Not on file  . Years of education: Not on file  . Highest education level: Not on file  Occupational History  . Occupation: permanant disability    Employer: [employer]  Social Needs  . Financial resource strain: Not on file  . Food insecurity    Worry: Not on file    Inability: Not on file  . Transportation needs    Medical: Not on file    Non-medical: Not on file  Tobacco Use  . Smoking status: Former Smoker    Packs/day: 0.75    Years: 40.00    Pack years: 30.00    Types: Cigarettes  . Smokeless tobacco: Never Used  . Tobacco comment: quit 05/2018  Substance and Sexual Activity  . Alcohol use: Yes    Comment: occassional; beer   . Drug use: No  . Sexual activity: Not Currently    Partners: Male  Lifestyle  . Physical activity    Days per week: Not on file    Minutes per session: Not on file  . Stress: Not on file  Relationships  . Social Herbalist on phone: Not on file    Gets together: Not on file    Attends religious service: Not on file    Active member of club or organization: Not on file    Attends meetings of clubs or organizations: Not on file    Relationship status: Not on file  . Intimate partner violence    Fear of current or ex partner: Not on file    Emotionally abused: Not on file    Physically abused: Not on file    Forced sexual activity: Not on file  Other Topics Concern  . Not on file  Social History Narrative   Exercise--- walking    Family History  Problem Relation Age of Onset  . Diabetes Sister   . Hypertension Mother   . Dementia Mother   . Colon cancer Neg Hx   . Stomach cancer Neg Hx   . Pancreatic cancer Neg Hx   . Colon polyps Neg Hx   . Rectal cancer Neg Hx     Review of Systems:  As stated in the HPI and otherwise negative.   BP (!) 86/60   Pulse 80   Ht 5\' 8"  (1.727 m)   Wt 144 lb 6.4 oz (65.5 kg)   LMP 04/11/2011    SpO2 98%   BMI 21.96 kg/m   Physical Examination: General: Well developed, well nourished, NAD  HEENT: OP clear, mucus membranes moist  SKIN: warm, dry. No rashes. Neuro: No focal deficits  Musculoskeletal: Muscle strength 5/5 all ext  Psychiatric: Mood and affect normal  Neck: No JVD, no carotid bruits, no thyromegaly, no lymphadenopathy.  Lungs:Clear bilaterally, no wheezes, rhonci, crackles Cardiovascular: Regular rate and rhythm. No murmurs, gallops or rubs. Abdomen:Soft. Bowel sounds present. Non-tender.  Extremities: No lower extremity edema. Pulses are 2 + in the bilateral DP/PT.  Echo 05/31/18:   1. The left ventricle has low normal systolic function, with an ejection fraction of 50-55%. The cavity size was normal. Left ventricular diastolic Doppler parameters are consistent with impaired relaxation.  2. LVEF is approximately 50 to 55% with severe hypokinesis of the inferor/inferoseptal walls.  3. The right ventricle has mildly reduced systolic function. The cavity was mildly enlarged. There is no increase in right ventricular wall thickness.  4. The inferior vena cava was dilated in size with <50% respiratory variability.  5. The interatrial septum was not assessed.  EKG:  EKG is not ordered today. The ekg ordered today demonstrates   Recent Labs: 06/16/2018: Magnesium 2.0 08/11/2018: Hemoglobin 12.6; Platelets 173.0 09/15/2018: ALT 28; BUN 22; Creatinine, Ser 0.97; Potassium 4.9; Sodium 135   Lipid Panel    Component Value Date/Time   CHOL 166 09/15/2018 0920   TRIG 125 09/15/2018 0920   HDL 68 09/15/2018 0920   CHOLHDL 2.4 09/15/2018 0920   CHOLHDL 2.6 05/30/2018 1107   VLDL 20 05/30/2018 1107   LDLCALC 73 09/15/2018 0920   LDLDIRECT 141.8 09/15/2012 1059     Wt Readings from Last 3 Encounters:  09/24/18 144 lb 6.4 oz (65.5 kg)  08/31/18 142 lb 12.8 oz (64.8 kg)  08/25/18 143 lb (64.9 kg)       Assessment and Plan:   1. CAD without angina: She has no  chest pain. Will continue ASA/Brilinta, statin, beta blocker and aldactone.   2. HTN: BP has been low over the last few visits. Will lower Cozaar to 50 mg daily.   3. HLD: Continue statin. LDL at goal.   Current medicines are reviewed at length with the patient today.  The patient does not have concerns regarding medicines.  The following changes have been made:  no change  Labs/ tests ordered today include:   Orders Placed This Encounter  Procedures  . EKG 12-Lead     Disposition:   FU with me in 6 months   Signed, Lauree Chandler, MD 09/24/2018 3:59 PM    [facility] [place], [place],   [address] Phone: [phone]; Fax: [phone]

## 2018-09-24 ENCOUNTER — Ambulatory Visit (INDEPENDENT_AMBULATORY_CARE_PROVIDER_SITE_OTHER): Payer: Medicare Other | Admitting: Cardiovascular Disease

## 2018-09-24 ENCOUNTER — Other Ambulatory Visit: Payer: Self-pay

## 2018-09-24 ENCOUNTER — Encounter: Payer: Self-pay | Admitting: Cardiovascular Disease

## 2018-09-24 VITALS — BP 86/60 | HR 80 | Ht 68.0 in | Wt 144.4 lb

## 2018-09-24 DIAGNOSIS — I10 Essential (primary) hypertension: Secondary | ICD-10-CM

## 2018-09-24 DIAGNOSIS — I2510 Atherosclerotic heart disease of native coronary artery without angina pectoris: Secondary | ICD-10-CM | POA: Diagnosis not present

## 2018-09-24 DIAGNOSIS — E782 Mixed hyperlipidemia: Secondary | ICD-10-CM | POA: Diagnosis not present

## 2018-09-24 MED ORDER — LOSARTAN POTASSIUM 50 MG PO TABS: 50.0000 mg | ORAL_TABLET | Freq: Every day | ORAL | 3 refills | Status: DC

## 2018-09-24 NOTE — Patient Instructions (Signed)
Medication Instructions:  1) DECREASE Losartan to 50mg  once daily  If you need a refill on your cardiac medications before your next appointment, please call your pharmacy.   Lab work: None If you have labs (blood work) drawn today and your tests are completely normal, you will receive your results only by: Marland Kitchen MyChart Message (if you have MyChart) OR . A paper copy in the mail If you have any lab test that is abnormal or we need to change your treatment, we will call you to review the results.  Testing/Procedures: None  Follow-Up: At [facility], you and your health needs are our priority.  As part of our continuing mission to provide you with exceptional heart care, we have created designated Provider Care Teams.  These Care Teams include your primary Cardiologist (physician) and Advanced Practice Providers (APPs -  Physician Assistants and Nurse Practitioners) who all work together to provide you with the care you need, when you need it. You will need a follow up appointment in 6 months.  Please call our office 2 months in advance to schedule this appointment.  You may see Lauree Chandler, MD or one of the following Advanced Practice Providers on your designated Care Team:   Holden [place], PA-C Melina Copa, PA-C . Ermalinda Barrios, PA-C  Any Other Special Instructions Will Be Listed Below (If Applicable).

## 2018-10-08 ENCOUNTER — Other Ambulatory Visit: Payer: Self-pay | Admitting: Family Medicine

## 2018-10-08 DIAGNOSIS — I10 Essential (primary) hypertension: Secondary | ICD-10-CM

## 2018-10-19 ENCOUNTER — Ambulatory Visit (HOSPITAL_COMMUNITY)
Admission: RE | Admit: 2018-10-19 | Discharge: 2018-10-19 | Disposition: A | Discharge status: Home / Self Care | Payer: Medicare Other | Source: Ambulatory Visit | Attending: Internal Medicine | Admitting: Internal Medicine

## 2018-10-19 ENCOUNTER — Other Ambulatory Visit: Payer: Self-pay

## 2018-10-19 DIAGNOSIS — G6181 Chronic inflammatory demyelinating polyneuritis: Secondary | ICD-10-CM | POA: Insufficient documentation

## 2018-10-19 DIAGNOSIS — M329 Systemic lupus erythematosus, unspecified: Secondary | ICD-10-CM | POA: Insufficient documentation

## 2018-10-19 MED ORDER — IMMUNE GLOBULIN (HUMAN) 10 GM/100ML IV SOLN
70.0000 g | Freq: Once | INTRAVENOUS | Status: AC
  Administered 2018-10-19: 70 g via INTRAVENOUS
  Filled 2018-10-19: qty 600

## 2018-10-19 MED ORDER — DIPHENHYDRAMINE HCL 25 MG PO CAPS
ORAL_CAPSULE | ORAL | Status: AC
  Filled 2018-10-19: qty 1

## 2018-10-19 MED ORDER — ACETAMINOPHEN 325 MG PO TABS
650.0000 mg | ORAL_TABLET | Freq: Once | ORAL | Status: AC
  Administered 2018-10-19: 650 mg via ORAL

## 2018-10-19 MED ORDER — ACETAMINOPHEN 325 MG PO TABS
ORAL_TABLET | ORAL | Status: AC
  Filled 2018-10-19: qty 2

## 2018-10-19 MED ORDER — DIPHENHYDRAMINE HCL 25 MG PO CAPS
25.0000 mg | ORAL_CAPSULE | Freq: Once | ORAL | Status: AC
  Administered 2018-10-19: 25 mg via ORAL

## 2018-10-29 ENCOUNTER — Other Ambulatory Visit: Payer: Self-pay | Admitting: Physician Assistant

## 2018-10-31 ENCOUNTER — Other Ambulatory Visit: Payer: Self-pay | Admitting: Family Medicine

## 2018-10-31 DIAGNOSIS — I10 Essential (primary) hypertension: Secondary | ICD-10-CM

## 2018-11-06 ENCOUNTER — Other Ambulatory Visit: Payer: Self-pay | Admitting: Family Medicine

## 2018-11-06 DIAGNOSIS — I10 Essential (primary) hypertension: Secondary | ICD-10-CM

## 2018-12-14 ENCOUNTER — Other Ambulatory Visit: Payer: Self-pay

## 2018-12-14 ENCOUNTER — Ambulatory Visit (HOSPITAL_COMMUNITY)
Admission: RE | Admit: 2018-12-14 | Discharge: 2018-12-14 | Disposition: A | Discharge status: Home / Self Care | Payer: Medicare Other | Source: Ambulatory Visit | Attending: Internal Medicine | Admitting: Internal Medicine

## 2018-12-14 DIAGNOSIS — G6181 Chronic inflammatory demyelinating polyneuritis: Secondary | ICD-10-CM | POA: Insufficient documentation

## 2018-12-14 DIAGNOSIS — M3219 Other organ or system involvement in systemic lupus erythematosus: Secondary | ICD-10-CM | POA: Diagnosis not present

## 2018-12-14 MED ORDER — DIPHENHYDRAMINE HCL 25 MG PO CAPS
25.0000 mg | ORAL_CAPSULE | Freq: Once | ORAL | Status: AC
  Administered 2018-12-14: 25 mg via ORAL

## 2018-12-14 MED ORDER — ACETAMINOPHEN 325 MG PO TABS
ORAL_TABLET | ORAL | Status: AC
  Filled 2018-12-14: qty 2

## 2018-12-14 MED ORDER — DIPHENHYDRAMINE HCL 25 MG PO CAPS
ORAL_CAPSULE | ORAL | Status: AC
  Filled 2018-12-14: qty 1

## 2018-12-14 MED ORDER — IMMUNE GLOBULIN (HUMAN) 10 GM/100ML IV SOLN
70.0000 g | Freq: Once | INTRAVENOUS | Status: AC
  Administered 2018-12-14: 70 g via INTRAVENOUS
  Filled 2018-12-14: qty 100

## 2018-12-14 MED ORDER — ACETAMINOPHEN 325 MG PO TABS
650.0000 mg | ORAL_TABLET | Freq: Once | ORAL | Status: AC
  Administered 2018-12-14: 650 mg via ORAL

## 2018-12-17 ENCOUNTER — Other Ambulatory Visit: Payer: Medicare Other | Admitting: *Deleted

## 2018-12-17 ENCOUNTER — Other Ambulatory Visit: Payer: Self-pay

## 2018-12-17 DIAGNOSIS — E785 Hyperlipidemia, unspecified: Secondary | ICD-10-CM | POA: Diagnosis not present

## 2018-12-17 LAB — LIPID PANEL
Chol/HDL Ratio: 2.3 ratio (ref 0.0–4.4)
Cholesterol, Total: 152 mg/dL (ref 100–199)
HDL: 65 mg/dL (ref >39)
LDL Chol Calc (NIH): 71 mg/dL (ref 0–99)
Triglycerides: 83 mg/dL (ref 0–149)
VLDL Cholesterol Cal: 16 mg/dL (ref 5–40)

## 2019-01-08 ENCOUNTER — Ambulatory Visit: Payer: Medicare Other | Admitting: Family Medicine

## 2019-02-02 ENCOUNTER — Encounter: Payer: Self-pay | Admitting: Family Medicine

## 2019-02-02 ENCOUNTER — Ambulatory Visit (INDEPENDENT_AMBULATORY_CARE_PROVIDER_SITE_OTHER): Payer: Medicare Other | Admitting: Family Medicine

## 2019-02-02 ENCOUNTER — Other Ambulatory Visit: Payer: Self-pay

## 2019-02-02 DIAGNOSIS — M329 Systemic lupus erythematosus, unspecified: Secondary | ICD-10-CM | POA: Diagnosis not present

## 2019-02-02 DIAGNOSIS — E785 Hyperlipidemia, unspecified: Secondary | ICD-10-CM | POA: Diagnosis not present

## 2019-02-02 DIAGNOSIS — I2510 Atherosclerotic heart disease of native coronary artery without angina pectoris: Secondary | ICD-10-CM

## 2019-02-02 DIAGNOSIS — Z86010 Personal history of colonic polyps: Secondary | ICD-10-CM

## 2019-02-02 NOTE — Progress Notes (Signed)
Virtual Visit via Video Note  I connected with Samantha [name] on 02/02/19 at 10:40 AM EST by a video enabled telemedicine application and verified that I am speaking with the correct person using two identifiers.  Location: Patient: home alone  Provider: home    I discussed the limitations of evaluation and management by telemedicine and the availability of in person appointments. The patient expressed understanding and agreed to proceed.  History of Present Illness: Pt is home with no complaints.   She is doing well--- cardiology follows bp and cholesterol  Past Medical History:  Diagnosis Date  . Altered mental status   . Backache, unspecified   . Gastric ulcer, unspecified as acute or chronic, without mention of hemorrhage, perforation, or obstruction   . Helicobacter pylori (H. pylori)   . Hyperlipidemia   . Lupus ([place])   . STEMI (ST elevation myocardial infarction) ([place])    PCI to p/d RCA  . Tobacco abuse   . Tobacco use disorder 11/22/2011  . Unspecified essential hypertension    Current Outpatient Medications on File Prior to Visit  Medication Sig Dispense Refill  . acetaminophen (TYLENOL) 500 MG tablet Take 500 mg by mouth every 6 (six) hours as needed for moderate pain or headache.    Marland Kitchen aspirin EC 81 MG tablet Take 1 tablet (81 mg total) by mouth daily.    Marland Kitchen atorvastatin (LIPITOR) 80 MG tablet Take 1 tablet (80 mg total) by mouth daily at 6 PM. 90 tablet 3  . Cetirizine HCl 10 MG CAPS Take 1 capsule (10 mg total) by mouth daily for 10 days. 10 capsule 0  . cyclobenzaprine (FLEXERIL) 10 MG tablet TAKE 1 TABLET(10 MG) BY MOUTH THREE TIMES DAILY AS NEEDED (Patient taking differently: Take 10 mg by mouth 3 (three) times daily as needed for muscle spasms. ) 30 tablet 0  . hydroxychloroquine (PLAQUENIL) 200 MG tablet Take 300 mg by mouth daily.     Marland Kitchen loperamide (IMODIUM A-D) 2 MG tablet Take 1 tablet (2 mg total) by mouth 4 (four) times daily as needed for diarrhea or loose  stools. 30 tablet 0  . losartan (COZAAR) 50 MG tablet Take 1 tablet (50 mg total) by mouth daily. 90 tablet 3  . metoprolol succinate (TOPROL-XL) 100 MG 24 hr tablet Take 1 tablet (100 mg total) by mouth daily. TAKE 1 TABLET BY MOUTH DAILY WITH OR IMMEDIATELY FOLLOWING A MEAL 90 tablet 1  . metoprolol tartrate (LOPRESSOR) 25 MG tablet Take 1 tablet (25 mg total) by mouth 2 (two) times daily. 180 tablet 3  . Multiple Vitamin (MULTIVITAMIN) tablet Take 1 tablet by mouth daily.    . nitroGLYCERIN (NITROSTAT) 0.4 MG SL tablet PLACE 1 TABLET (0.4 MG TOTAL) UNDER THE TONGUE EVERY 5 (FIVE) MINUTES AS NEEDED FOR CHEST PAIN. 75 tablet 2  . pantoprazole (PROTONIX) 20 MG tablet Take 1 tablet (20 mg total) by mouth daily. 90 tablet 3  . spironolactone (ALDACTONE) 25 MG tablet Take 1 tablet (25 mg total) by mouth daily. 90 tablet 3  . ticagrelor (BRILINTA) 90 MG TABS tablet Take 1 tablet (90 mg total) by mouth 2 (two) times daily. 180 tablet 3   No current facility-administered medications on file prior to visit.   Allergies  Allergen Reactions  . Tramadol Nausea Only      Observations/Objective: bp 126/86   p 76  Afebrile  Pt is in NAD   Assessment and Plan: 1. History of colon polyps F/u GI  -  Ambulatory referral to Gastroenterology Colonoscopy reviewed  Referral placed  2. Systemic lupus erythematosus, unspecified SLE type, unspecified organ involvement status ([place]) Per rheum / cardiology  3. Hyperlipidemia LDL goal <70 Tolerating statin, encouraged heart healthy diet, avoid trans fats, minimize simple carbs and saturated fats. Increase exercise as tolerated Labs reviewed  Lab Results  Component Value Date   CHOL 152 12/17/2018   HDL 65 12/17/2018   LDLCALC 71 12/17/2018   LDLDIRECT 141.8 09/15/2012   TRIG 83 12/17/2018   CHOLHDL 2.3 12/17/2018    4. Coronary artery disease involving native coronary artery of native heart without angina pectoris con't meds F/u cardiology Labs  reviewed    Follow Up Instructions:    I discussed the assessment and treatment plan with the patient. The patient was provided an opportunity to ask questions and all were answered. The patient agreed with the plan and demonstrated an understanding of the instructions.   The patient was advised to call back or seek an in-person evaluation if the symptoms worsen or if the condition fails to improve as anticipated.  I provided 15 minutes of non-face-to-face time during this encounter.   [name], DO

## 2019-02-02 NOTE — Assessment & Plan Note (Signed)
Tolerating statin, encouraged heart healthy diet, avoid trans fats, minimize simple carbs and saturated fats. Increase exercise as tolerated 

## 2019-02-02 NOTE — Assessment & Plan Note (Signed)
Per cardiology, rheum

## 2019-02-02 NOTE — Assessment & Plan Note (Signed)
Per cardiology 

## 2019-02-05 ENCOUNTER — Other Ambulatory Visit: Payer: Self-pay | Admitting: Family Medicine

## 2019-02-05 DIAGNOSIS — Z1231 Encounter for screening mammogram for malignant neoplasm of breast: Secondary | ICD-10-CM

## 2019-02-05 DIAGNOSIS — M3219 Other organ or system involvement in systemic lupus erythematosus: Secondary | ICD-10-CM | POA: Diagnosis not present

## 2019-02-05 DIAGNOSIS — Z79899 Other long term (current) drug therapy: Secondary | ICD-10-CM | POA: Diagnosis not present

## 2019-02-08 ENCOUNTER — Other Ambulatory Visit: Payer: Self-pay

## 2019-02-08 ENCOUNTER — Other Ambulatory Visit: Payer: Self-pay | Admitting: Family Medicine

## 2019-02-08 ENCOUNTER — Ambulatory Visit (HOSPITAL_COMMUNITY)
Admission: RE | Admit: 2019-02-08 | Discharge: 2019-02-08 | Disposition: A | Discharge status: Home / Self Care | Payer: Medicare Other | Source: Ambulatory Visit | Attending: Internal Medicine | Admitting: Internal Medicine

## 2019-02-08 DIAGNOSIS — M3219 Other organ or system involvement in systemic lupus erythematosus: Secondary | ICD-10-CM | POA: Insufficient documentation

## 2019-02-08 DIAGNOSIS — I10 Essential (primary) hypertension: Secondary | ICD-10-CM

## 2019-02-08 MED ORDER — DIPHENHYDRAMINE HCL 25 MG PO CAPS: 25.0000 mg | ORAL_CAPSULE | Freq: Once | ORAL | Status: AC

## 2019-02-08 MED ORDER — IMMUNE GLOBULIN (HUMAN) 10 GM/100ML IV SOLN
70.0000 g | Freq: Once | INTRAVENOUS | Status: AC
  Administered 2019-02-08: 70 g via INTRAVENOUS
  Filled 2019-02-08: qty 700

## 2019-02-08 MED ORDER — ACETAMINOPHEN 325 MG PO TABS
ORAL_TABLET | ORAL | Status: AC
  Administered 2019-02-08: 650 mg via ORAL
  Filled 2019-02-08: qty 2

## 2019-02-08 MED ORDER — ACETAMINOPHEN 325 MG PO TABS: 650.0000 mg | ORAL_TABLET | Freq: Once | ORAL | Status: AC

## 2019-02-08 MED ORDER — DIPHENHYDRAMINE HCL 25 MG PO CAPS
ORAL_CAPSULE | ORAL | Status: AC
  Administered 2019-02-08: 25 mg via ORAL
  Filled 2019-02-08: qty 1

## 2019-03-17 ENCOUNTER — Other Ambulatory Visit: Payer: Self-pay

## 2019-03-17 ENCOUNTER — Ambulatory Visit
Admission: RE | Admit: 2019-03-17 | Discharge: 2019-03-17 | Disposition: A | Discharge status: Home / Self Care | Payer: Medicare Other | Source: Ambulatory Visit | Attending: Family Medicine | Admitting: Family Medicine

## 2019-03-17 DIAGNOSIS — Z1231 Encounter for screening mammogram for malignant neoplasm of breast: Secondary | ICD-10-CM

## 2019-04-02 ENCOUNTER — Other Ambulatory Visit (HOSPITAL_COMMUNITY): Payer: Self-pay | Admitting: *Deleted

## 2019-04-05 ENCOUNTER — Other Ambulatory Visit: Payer: Self-pay

## 2019-04-05 ENCOUNTER — Ambulatory Visit (HOSPITAL_COMMUNITY)
Admission: RE | Admit: 2019-04-05 | Discharge: 2019-04-05 | Disposition: A | Discharge status: Home / Self Care | Payer: Medicare Other | Source: Ambulatory Visit | Attending: Internal Medicine | Admitting: Internal Medicine

## 2019-04-05 DIAGNOSIS — M3219 Other organ or system involvement in systemic lupus erythematosus: Secondary | ICD-10-CM | POA: Diagnosis not present

## 2019-04-05 MED ORDER — DIPHENHYDRAMINE HCL 25 MG PO CAPS
25.0000 mg | ORAL_CAPSULE | Freq: Once | ORAL | Status: AC
  Administered 2019-04-05: 08:00:00 25 mg via ORAL

## 2019-04-05 MED ORDER — DIPHENHYDRAMINE HCL 25 MG PO CAPS
ORAL_CAPSULE | ORAL | Status: AC
  Filled 2019-04-05: qty 1

## 2019-04-05 MED ORDER — ACETAMINOPHEN 325 MG PO TABS
ORAL_TABLET | ORAL | Status: AC
  Filled 2019-04-05: qty 2

## 2019-04-05 MED ORDER — IMMUNE GLOBULIN (HUMAN) 10 GM/100ML IV SOLN
70.0000 g | Freq: Once | INTRAVENOUS | Status: AC
  Administered 2019-04-05: 70 g via INTRAVENOUS
  Filled 2019-04-05: qty 600

## 2019-04-05 MED ORDER — ACETAMINOPHEN 325 MG PO TABS
650.0000 mg | ORAL_TABLET | Freq: Once | ORAL | Status: AC
  Administered 2019-04-05: 08:00:00 650 mg via ORAL

## 2019-04-12 ENCOUNTER — Telehealth: Payer: Self-pay | Admitting: Family Medicine

## 2019-04-12 NOTE — Telephone Encounter (Signed)
Please advise if patient should receive covid vaccine or not due to medication history.

## 2019-04-13 NOTE — Telephone Encounter (Signed)
Spoke with patient. Advised to get shot

## 2019-04-19 DIAGNOSIS — U071 COVID-19: Secondary | ICD-10-CM | POA: Diagnosis not present

## 2019-04-21 DIAGNOSIS — I10 Essential (primary) hypertension: Secondary | ICD-10-CM | POA: Diagnosis not present

## 2019-04-21 DIAGNOSIS — I700 Atherosclerosis of aorta: Secondary | ICD-10-CM | POA: Diagnosis not present

## 2019-04-21 DIAGNOSIS — I2510 Atherosclerotic heart disease of native coronary artery without angina pectoris: Secondary | ICD-10-CM | POA: Diagnosis not present

## 2019-04-29 ENCOUNTER — Other Ambulatory Visit: Payer: Self-pay | Admitting: Physician Assistant

## 2019-04-29 NOTE — Progress Notes (Signed)
Chief Complaint  Patient presents with  . Follow-up    CAD   History of Present Illness: 59 yo female with history of lupus, CAD, HLD, HTN and tobacco abuse here today for cardiac follow up. She was admitted to [facility] May 2020 with an inferior STEMI treated with drug eluting stents in the proximal and distal RCA. Her initial presentation was initially worrisome for an aortic dissection based on her CTA but TEE did not show an aortic dissection. Chest CTA August 2020 with resolution of aortic fluid/soft tissue abnormality. No evidence of aortic dissection. Echo May 2020 with normal LV size and function.   She is here today for follow up. The patient denies any chest pain, dyspnea, palpitations, lower extremity edema, orthopnea, PND, dizziness, near syncope or syncope.    Primary Care Physician: Carollee Herter, Alferd Apa, DO  Past Medical History:  Diagnosis Date  . Altered mental status   . Backache, unspecified   . Gastric ulcer, unspecified as acute or chronic, without mention of hemorrhage, perforation, or obstruction   . Helicobacter pylori (H. pylori)   . Hyperlipidemia   . Lupus ([place])   . STEMI (ST elevation myocardial infarction) ([place])    PCI to p/d RCA  . Tobacco abuse   . Tobacco use disorder 11/22/2011  . Unspecified essential hypertension     Past Surgical History:  Procedure Laterality Date  . CORONARY/GRAFT ACUTE MI REVASCULARIZATION N/A 05/30/2018   Procedure: Coronary/Graft Acute MI Revascularization;  Surgeon: Burnell Blanks, MD;  Location: [employer];  Service: Cardiovascular;  Laterality: N/A;  . INTRAOPERATIVE TRANSESOPHAGEAL ECHOCARDIOGRAM N/A 05/30/2018   Procedure: Transesophageal Echocardiogram;  Surgeon: Grace Isaac, MD;  Location: [facility] OR;  Service: Vascular;  Laterality: N/A;  . LEFT HEART CATH AND CORONARY ANGIOGRAPHY N/A 05/30/2018   Procedure: LEFT HEART CATH AND CORONARY ANGIOGRAPHY;  Surgeon: Burnell Blanks, MD;  Location: [place] CV LAB;  Service: Cardiovascular;  Laterality: N/A;  . UPPER GASTROINTESTINAL ENDOSCOPY  2013    Current Outpatient Medications  Medication Sig Dispense Refill  . acetaminophen (TYLENOL) 500 MG tablet Take 500 mg by mouth every 6 (six) hours as needed for moderate pain or headache.    Marland Kitchen aspirin EC 81 MG tablet Take 1 tablet (81 mg total) by mouth daily.    Marland Kitchen atorvastatin (LIPITOR) 80 MG tablet Take 1 tablet (80 mg total) by mouth daily at 6 PM. 90 tablet 3  . Cetirizine HCl 10 MG CAPS Take 1 capsule (10 mg total) by mouth daily for 10 days. 10 capsule 0  . cyclobenzaprine (FLEXERIL) 10 MG tablet TAKE 1 TABLET(10 MG) BY MOUTH THREE TIMES DAILY AS NEEDED (Patient taking differently: Take 10 mg by mouth 3 (three) times daily as needed for muscle spasms. ) 30 tablet 0  . hydroxychloroquine (PLAQUENIL) 200 MG tablet Take 300 mg by mouth daily.     Marland Kitchen loperamide (IMODIUM A-D) 2 MG tablet Take 1 tablet (2 mg total) by mouth 4 (four) times daily as needed for diarrhea or loose stools. 30 tablet 0  . losartan (COZAAR) 50 MG tablet Take 1 tablet (50 mg total) by mouth daily. 90 tablet 3  . metoprolol succinate (TOPROL-XL) 100 MG 24 hr tablet TAKE 1 TABLET BY MOUTH DAILY WITH OR IMMEDIATELY FOLLOWING A MEAL 90 tablet 1  . metoprolol tartrate (LOPRESSOR) 25 MG tablet Take 1 tablet (25 mg total) by mouth 2 (two) times daily. 180 tablet 3  . Multiple Vitamin (  MULTIVITAMIN) tablet Take 1 tablet by mouth daily.    . nitroGLYCERIN (NITROSTAT) 0.4 MG SL tablet PLACE 1 TABLET (0.4 MG TOTAL) UNDER THE TONGUE EVERY 5 (FIVE) MINUTES AS NEEDED FOR CHEST PAIN. 75 tablet 2  . pantoprazole (PROTONIX) 20 MG tablet TAKE 1 TABLET BY MOUTH EVERY DAY 90 tablet 1  . spironolactone (ALDACTONE) 25 MG tablet TAKE 1 TABLET BY MOUTH EVERY DAY 90 tablet 1  . clopidogrel (PLAVIX) 75 MG tablet Take 1 tablet (75 mg total) by mouth daily. 90 tablet 3   No current facility-administered medications for this visit.    Allergies   Allergen Reactions  . Tramadol Nausea Only    Social History   Socioeconomic History  . Marital status: Single    Spouse name: Not on file  . Number of children: Not on file  . Years of education: Not on file  . Highest education level: Not on file  Occupational History  . Occupation: permanant disability    Employer: [employer]  Tobacco Use  . Smoking status: Former Smoker    Packs/day: 0.75    Years: 40.00    Pack years: 30.00    Types: Cigarettes  . Smokeless tobacco: Never Used  . Tobacco comment: quit 05/2018  Substance and Sexual Activity  . Alcohol use: Yes    Comment: occassional; beer   . Drug use: No  . Sexual activity: Not Currently    Partners: Male  Other Topics Concern  . Not on file  Social History Narrative   Exercise--- walking   Social Determinants of Health   Financial Resource Strain:   . Difficulty of Paying Living Expenses:   Food Insecurity:   . Worried About Charity fundraiser in the Last Year:   . Arboriculturist in the Last Year:   Transportation Needs:   . Film/video editor (Medical):   Marland Kitchen Lack of Transportation (Non-Medical):   Physical Activity:   . Days of Exercise per Week:   . Minutes of Exercise per Session:   Stress:   . Feeling of Stress :   Social Connections:   . Frequency of Communication with Friends and Family:   . Frequency of Social Gatherings with Friends and Family:   . Attends Religious Services:   . Active Member of Clubs or Organizations:   . Attends Archivist Meetings:   Marland Kitchen Marital Status:   Intimate Partner Violence:   . Fear of Current or Ex-Partner:   . Emotionally Abused:   Marland Kitchen Physically Abused:   . Sexually Abused:     Family History  Problem Relation Age of Onset  . Diabetes Sister   . Hypertension Mother   . Dementia Mother   . Colon cancer Neg Hx   . Stomach cancer Neg Hx   . Pancreatic cancer Neg Hx   . Colon polyps Neg Hx   . Rectal cancer Neg Hx     Review of Systems:   As stated in the HPI and otherwise negative.   BP 100/66   Pulse 71   Ht 5\' 8"  (1.727 m)   Wt 162 lb (73.5 kg)   LMP 04/11/2011   SpO2 98%   BMI 24.63 kg/m   Physical Examination: General: Well developed, well nourished, NAD  HEENT: OP clear, mucus membranes moist  SKIN: warm, dry. No rashes. Neuro: No focal deficits  Musculoskeletal: Muscle strength 5/5 all ext  Psychiatric: Mood and affect normal  Neck: No JVD, no  carotid bruits, no thyromegaly, no lymphadenopathy.  Lungs:Clear bilaterally, no wheezes, rhonci, crackles Cardiovascular: Regular rate and rhythm. No murmurs, gallops or rubs. Abdomen:Soft. Bowel sounds present. Non-tender.  Extremities: No lower extremity edema. Pulses are 2 + in the bilateral DP/PT.  Echo 05/31/18: 1. The left ventricle has low normal systolic function, with an ejection fraction of 50-55%. The cavity size was normal. Left ventricular diastolic Doppler parameters are consistent with impaired relaxation.  2. LVEF is approximately 50 to 55% with severe hypokinesis of the inferor/inferoseptal walls.  3. The right ventricle has mildly reduced systolic function. The cavity was mildly enlarged. There is no increase in right ventricular wall thickness.  4. The inferior vena cava was dilated in size with <50% respiratory variability.  5. The interatrial septum was not assessed.  EKG:  EKG is ordered today. The ekg ordered today demonstrates NSR  Recent Labs: 06/16/2018: Magnesium 2.0 08/11/2018: Hemoglobin 12.6; Platelets 173.0 09/15/2018: ALT 28; BUN 22; Creatinine, Ser 0.97; Potassium 4.9; Sodium 135   Lipid Panel    Component Value Date/Time   CHOL 152 12/17/2018 0806   TRIG 83 12/17/2018 0806   HDL 65 12/17/2018 0806   CHOLHDL 2.3 12/17/2018 0806   CHOLHDL 2.6 05/30/2018 1107   VLDL 20 05/30/2018 1107   LDLCALC 71 12/17/2018 0806   LDLDIRECT 141.8 09/15/2012 1059     Wt Readings from Last 3 Encounters:  04/30/19 162 lb (73.5 kg)  04/05/19  162 lb (73.5 kg)  02/08/19 162 lb (73.5 kg)       Assessment and Plan:   1. CAD without angina: No chest pain. Continue ASA, statin, beta blocker and aldactone. Will change Brilinta to Plavix.    2. HTN: BP is controlled. No changes.   3. HLD: LDL at goal. Continue statin.   Current medicines are reviewed at length with the patient today.  The patient does not have concerns regarding medicines.  The following changes have been made:  no change  Labs/ tests ordered today include:   Orders Placed This Encounter  Procedures  . EKG 12-Lead   Disposition:   FU with me in 12 months  Signed, Lauree Chandler, MD 04/30/2019 8:32 AM    [facility] [address], [place], [place]  [address] Phone: [phone]; Fax: [phone]

## 2019-04-30 ENCOUNTER — Ambulatory Visit (INDEPENDENT_AMBULATORY_CARE_PROVIDER_SITE_OTHER): Payer: Medicare Other | Admitting: Cardiovascular Disease

## 2019-04-30 ENCOUNTER — Encounter: Payer: Self-pay | Admitting: Cardiovascular Disease

## 2019-04-30 ENCOUNTER — Other Ambulatory Visit: Payer: Self-pay

## 2019-04-30 VITALS — BP 100/66 | HR 71 | Ht 68.0 in | Wt 162.0 lb

## 2019-04-30 DIAGNOSIS — I10 Essential (primary) hypertension: Secondary | ICD-10-CM | POA: Diagnosis not present

## 2019-04-30 DIAGNOSIS — I2510 Atherosclerotic heart disease of native coronary artery without angina pectoris: Secondary | ICD-10-CM

## 2019-04-30 DIAGNOSIS — E785 Hyperlipidemia, unspecified: Secondary | ICD-10-CM

## 2019-04-30 MED ORDER — CLOPIDOGREL BISULFATE 75 MG PO TABS: 75.0000 mg | ORAL_TABLET | Freq: Every day | ORAL | 3 refills | Status: DC

## 2019-04-30 NOTE — Patient Instructions (Addendum)
Medication Instructions:  Your physician has recommended you make the following change in your medication:  STOP Brilinta  START Plavix (Clopidogrel) 75 mg once daily  *If you need a refill on your cardiac medications before your next appointment, please call your pharmacy*   Lab Work: None Ordered If you have labs (blood work) drawn today and your tests are completely normal, you will receive your results only by: Marland Kitchen MyChart Message (if you have MyChart) OR . A paper copy in the mail If you have any lab test that is abnormal or we need to change your treatment, we will call you to review the results.    Testing/Procedures: None Ordered    Follow-Up: At [facility], you and your health needs are our priority.  As part of our continuing mission to provide you with exceptional heart care, we have created designated Provider Care Teams.  These Care Teams include your primary Cardiologist (physician) and Advanced Practice Providers (APPs -  Physician Assistants and Nurse Practitioners) who all work together to provide you with the care you need, when you need it.   Your next appointment:   1 year(s)  The format for your next appointment:   In Person  Provider:   You may see Lauree Chandler, MD or one of the following Advanced Practice Providers on your designated Care Team:    Melina Copa, PA-C  Ermalinda Barrios, PA-C

## 2019-05-06 ENCOUNTER — Other Ambulatory Visit: Payer: Self-pay | Admitting: Physician Assistant

## 2019-05-17 DIAGNOSIS — Z23 Encounter for immunization: Secondary | ICD-10-CM | POA: Diagnosis not present

## 2019-05-28 ENCOUNTER — Other Ambulatory Visit (HOSPITAL_COMMUNITY): Payer: Self-pay | Admitting: *Deleted

## 2019-05-30 ENCOUNTER — Other Ambulatory Visit: Payer: Self-pay | Admitting: Physician Assistant

## 2019-05-31 ENCOUNTER — Ambulatory Visit (HOSPITAL_COMMUNITY)
Admission: RE | Admit: 2019-05-31 | Discharge: 2019-05-31 | Disposition: A | Discharge status: Home / Self Care | Payer: Medicare Other | Source: Ambulatory Visit | Attending: Internal Medicine | Admitting: Internal Medicine

## 2019-05-31 ENCOUNTER — Other Ambulatory Visit: Payer: Self-pay

## 2019-05-31 DIAGNOSIS — M329 Systemic lupus erythematosus, unspecified: Secondary | ICD-10-CM | POA: Insufficient documentation

## 2019-05-31 DIAGNOSIS — G629 Polyneuropathy, unspecified: Secondary | ICD-10-CM | POA: Diagnosis not present

## 2019-05-31 MED ORDER — ACETAMINOPHEN 325 MG PO TABS: 650.0000 mg | ORAL_TABLET | ORAL | Status: DC

## 2019-05-31 MED ORDER — DIPHENHYDRAMINE HCL 25 MG PO CAPS: 25.0000 mg | ORAL_CAPSULE | ORAL | Status: DC

## 2019-05-31 MED ORDER — ACETAMINOPHEN 325 MG PO TABS
ORAL_TABLET | ORAL | Status: AC
  Administered 2019-05-31: 650 mg
  Filled 2019-05-31: qty 2

## 2019-05-31 MED ORDER — DIPHENHYDRAMINE HCL 25 MG PO CAPS
ORAL_CAPSULE | ORAL | Status: AC
  Administered 2019-05-31: 25 mg
  Filled 2019-05-31: qty 1

## 2019-05-31 MED ORDER — IMMUNE GLOBULIN (HUMAN) 10 GM/100ML IV SOLN
70.0000 g | INTRAVENOUS | Status: DC
  Administered 2019-05-31: 70 g via INTRAVENOUS
  Filled 2019-05-31: qty 100

## 2019-06-07 DIAGNOSIS — Z23 Encounter for immunization: Secondary | ICD-10-CM | POA: Diagnosis not present

## 2019-07-26 ENCOUNTER — Other Ambulatory Visit: Payer: Self-pay | Admitting: Physician Assistant

## 2019-07-26 ENCOUNTER — Ambulatory Visit (HOSPITAL_COMMUNITY)
Admission: RE | Admit: 2019-07-26 | Discharge: 2019-07-26 | Disposition: A | Discharge status: Home / Self Care | Payer: Medicare Other | Source: Ambulatory Visit | Attending: Internal Medicine | Admitting: Internal Medicine

## 2019-07-26 ENCOUNTER — Other Ambulatory Visit: Payer: Self-pay

## 2019-07-26 DIAGNOSIS — M329 Systemic lupus erythematosus, unspecified: Secondary | ICD-10-CM | POA: Diagnosis present

## 2019-07-26 DIAGNOSIS — G629 Polyneuropathy, unspecified: Secondary | ICD-10-CM | POA: Insufficient documentation

## 2019-07-26 MED ORDER — ACETAMINOPHEN 325 MG PO TABS: 650.0000 mg | ORAL_TABLET | ORAL | Status: DC

## 2019-07-26 MED ORDER — ACETAMINOPHEN 325 MG PO TABS
ORAL_TABLET | ORAL | Status: AC
  Administered 2019-07-26: 650 mg via ORAL
  Filled 2019-07-26: qty 2

## 2019-07-26 MED ORDER — IMMUNE GLOBULIN (HUMAN) 10 GM/100ML IV SOLN
70.0000 g | INTRAVENOUS | Status: DC
  Administered 2019-07-26: 70 g via INTRAVENOUS
  Filled 2019-07-26: qty 600

## 2019-07-26 MED ORDER — DIPHENHYDRAMINE HCL 25 MG PO CAPS: 25.0000 mg | ORAL_CAPSULE | ORAL | Status: DC

## 2019-07-26 MED ORDER — DIPHENHYDRAMINE HCL 25 MG PO CAPS
ORAL_CAPSULE | ORAL | Status: AC
  Administered 2019-07-26: 25 mg via ORAL
  Filled 2019-07-26: qty 1

## 2019-07-29 ENCOUNTER — Other Ambulatory Visit: Payer: Self-pay | Admitting: Physician Assistant

## 2019-08-08 ENCOUNTER — Other Ambulatory Visit: Payer: Self-pay | Admitting: Family Medicine

## 2019-08-08 DIAGNOSIS — I10 Essential (primary) hypertension: Secondary | ICD-10-CM

## 2019-08-20 NOTE — Progress Notes (Signed)
I connected with Samantha [name] today by telephone and verified that I am speaking with the correct person using two identifiers. Location patient: home Location provider: work Persons participating in the virtual visit: patient, Marine scientist.    I discussed the limitations, risks, security and privacy concerns of performing an evaluation and management service by telephone and the availability of in person appointments. I also discussed with the patient that there may be a patient responsible charge related to this service. The patient expressed understanding and verbally consented to this telephonic visit.    Interactive audio and video telecommunications were attempted between this provider and patient, however failed, due to patient having technical difficulties OR patient did not have access to video capability.  We continued and completed visit with audio only.  Some vital signs may be absent or patient reported.    Subjective:   Samantha [name] is a 58 y.o. female who presents for Medicare Annual (Subsequent) preventive examination.  Review of Systems     Cardiac Risk Factors include: advanced age (>73men, >64 women);dyslipidemia     Objective:      Advanced Directives 08/23/2019 08/18/2018 06/13/2018 05/30/2018 08/12/2017 12/25/2015 12/13/2015  Does Patient Have a Medical Advance Directive? Yes Yes No Yes Yes No No  Type of Paramedic of [place];Living will [place] will [place];Living will - -  Does patient want to make changes to medical advance directive? No - Patient declined No - Patient declined - - - - -  Copy of [place] in Chart? Yes - validated most recent copy scanned in chart (See row information) Yes - validated most recent copy scanned in chart (See row information) - - Yes - -  Would patient like information on creating a medical advance directive? - - No - Patient declined - - Yes  (MAU/Ambulatory/Procedural Areas - Information given) No - patient declined information  Pre-existing out of facility DNR order (yellow form or pink MOST form) - - - - - - -    Current Medications (verified) Outpatient Encounter Medications as of 08/23/2019  Medication Sig  . acetaminophen (TYLENOL) 500 MG tablet Take 500 mg by mouth every 6 (six) hours as needed for moderate pain or headache.  Marland Kitchen aspirin EC 81 MG tablet Take 1 tablet (81 mg total) by mouth daily.  Marland Kitchen atorvastatin (LIPITOR) 80 MG tablet TAKE 1 TABLET (80 MG TOTAL) BY MOUTH DAILY AT 6 PM.  . clopidogrel (PLAVIX) 75 MG tablet Take 1 tablet (75 mg total) by mouth daily.  . cyclobenzaprine (FLEXERIL) 10 MG tablet TAKE 1 TABLET(10 MG) BY MOUTH THREE TIMES DAILY AS NEEDED (Patient taking differently: Take 10 mg by mouth 3 (three) times daily as needed for muscle spasms. )  . hydroxychloroquine (PLAQUENIL) 200 MG tablet Take 300 mg by mouth daily.   Marland Kitchen loperamide (IMODIUM A-D) 2 MG tablet Take 1 tablet (2 mg total) by mouth 4 (four) times daily as needed for diarrhea or loose stools.  Marland Kitchen losartan (COZAAR) 50 MG tablet Take 1 tablet (50 mg total) by mouth daily.  . metoprolol succinate (TOPROL-XL) 100 MG 24 hr tablet Take 1 tablet (100 mg total) by mouth daily. Immediately with or following a meal.  . metoprolol tartrate (LOPRESSOR) 25 MG tablet TAKE 1 TABLET BY MOUTH TWICE A DAY  . Multiple Vitamin (MULTIVITAMIN) tablet Take 1 tablet by mouth daily.  . pantoprazole (PROTONIX) 20 MG tablet TAKE 1 TABLET BY MOUTH EVERY  DAY  . spironolactone (ALDACTONE) 25 MG tablet TAKE 1 TABLET BY MOUTH EVERY DAY  . Cetirizine HCl 10 MG CAPS Take 1 capsule (10 mg total) by mouth daily for 10 days.  . nitroGLYCERIN (NITROSTAT) 0.4 MG SL tablet PLACE 1 TABLET (0.4 MG TOTAL) UNDER THE TONGUE EVERY 5 (FIVE) MINUTES AS NEEDED FOR CHEST PAIN. (Patient not taking: Reported on 08/23/2019)   No facility-administered encounter medications on file as of 08/23/2019.     Allergies (verified) Tramadol   History: Past Medical History:  Diagnosis Date  . Altered mental status   . Backache, unspecified   . Gastric ulcer, unspecified as acute or chronic, without mention of hemorrhage, perforation, or obstruction   . Helicobacter pylori (H. pylori)   . Hyperlipidemia   . Lupus ([place])   . STEMI (ST elevation myocardial infarction) ([place])    PCI to p/d RCA  . Tobacco abuse   . Tobacco use disorder 11/22/2011  . Unspecified essential hypertension    Past Surgical History:  Procedure Laterality Date  . CORONARY/GRAFT ACUTE MI REVASCULARIZATION N/A 05/30/2018   Procedure: Coronary/Graft Acute MI Revascularization;  Surgeon: Burnell Blanks, MD;  Location: [place] CV LAB;  Service: Cardiovascular;  Laterality: N/A;  . INTRAOPERATIVE TRANSESOPHAGEAL ECHOCARDIOGRAM N/A 05/30/2018   Procedure: Transesophageal Echocardiogram;  Surgeon: Grace Isaac, MD;  Location: [facility] OR;  Service: Vascular;  Laterality: N/A;  . LEFT HEART CATH AND CORONARY ANGIOGRAPHY N/A 05/30/2018   Procedure: LEFT HEART CATH AND CORONARY ANGIOGRAPHY;  Surgeon: Burnell Blanks, MD;  Location: [place] [facility] LAB;  Service: Cardiovascular;  Laterality: N/A;  . UPPER GASTROINTESTINAL ENDOSCOPY  2013   Family History  Problem Relation Age of Onset  . Diabetes Sister   . Hypertension Mother   . Dementia Mother   . Colon cancer Neg Hx   . Stomach cancer Neg Hx   . Pancreatic cancer Neg Hx   . Colon polyps Neg Hx   . Rectal cancer Neg Hx    Social History   Socioeconomic History  . Marital status: Single    Spouse name: Not on file  . Number of children: Not on file  . Years of education: Not on file  . Highest education level: Not on file  Occupational History  . Occupation: permanant disability    Employer: [employer]  Tobacco Use  . Smoking status: Former Smoker    Packs/day: 0.75    Years: 40.00    Pack years: 30.00    Types: Cigarettes  . Smokeless  tobacco: Never Used  . Tobacco comment: quit 05/2018  Vaping Use  . Vaping Use: Never used  Substance and Sexual Activity  . Alcohol use: Yes    Comment: occassional; beer   . Drug use: No  . Sexual activity: Not Currently    Partners: Male  Other Topics Concern  . Not on file  Social History Narrative   Exercise--- walking   Social Determinants of Health   Financial Resource Strain: Low Risk   . Difficulty of Paying Living Expenses: Not hard at all  Food Insecurity: No Food Insecurity  . Worried About Charity fundraiser in the Last Year: Never true  . Ran Out of Food in the Last Year: Never true  Transportation Needs: No Transportation Needs  . Lack of Transportation (Medical): No  . Lack of Transportation (Non-Medical): No  Physical Activity:   . Days of Exercise per Week:   . Minutes of Exercise per Session:  Stress:   . Feeling of Stress :   Social Connections:   . Frequency of Communication with Friends and Family:   . Frequency of Social Gatherings with Friends and Family:   . Attends Religious Services:   . Active Member of Clubs or Organizations:   . Attends Archivist Meetings:   Marland Kitchen Marital Status:     Tobacco Counseling Counseling given: Not Answered Comment: quit 05/2018   Clinical Intake: Pain : No/denies pain    Activities of Daily Living In your present state of health, do you have any difficulty performing the following activities: 08/23/2019  Hearing? N  Vision? N  Difficulty concentrating or making decisions? N  Walking or climbing stairs? N  Dressing or bathing? N  Doing errands, shopping? N  Preparing Food and eating ? N  Using the Toilet? N  In the past six months, have you accidently leaked urine? N  Do you have problems with loss of bowel control? N  Managing your Medications? N  Managing your Finances? N  Housekeeping or managing your Housekeeping? N  Some recent data might be hidden    Patient Care Team: Carollee Herter,  Alferd Apa, DO as PCP - General (Family Medicine) Burnell Blanks, MD as PCP - Cardiology (Cardiology) Candee Furbish, MD as Referring Physician (Rheumatology) Inda Castle, MD (Inactive) as Consulting Physician (Gastroenterology) Melissa Noon, [place] as Referring Physician (Optometry)  Indicate any recent Medical Services you may have received from other than [facility] providers in the past year (date may be approximate).     Assessment:   This is a routine wellness examination for Bryan.   Dietary issues and exercise activities discussed: Current Exercise Habits: The patient does not participate in regular exercise at present, Exercise limited by: None identified Diet (meal preparation, eat out, water intake, caffeinated beverages, dairy products, fruits and vegetables): well balanced    Goals    . Quit smoking / using tobacco      Depression Screen PHQ 2/9 Scores 08/23/2019 08/18/2018 08/12/2017 02/11/2017 01/26/2016 07/25/2014 09/15/2012  PHQ - 2 Score 0 0 0 0 0 0 0    Fall Risk Fall Risk  08/23/2019 08/18/2018 08/12/2017 07/25/2014  Falls in the past year? 0 0 No No  Number falls in past yr: 0 - - -  Injury with Fall? 0 - - -  Follow up Education provided;Falls prevention discussed - - -   Lives w/ mother in 1 story home. Any stairs in or around the home? Yes  If so, are there any without handrails? No  Home free of loose throw rugs in walkways, pet beds, electrical cords, etc? Yes  Adequate lighting in your home to reduce risk of falls? Yes    ASSISTIVE DEVICES UTILIZED TO PREVENT FALLS:  None   Cognitive Function: Ad8 score reviewed for issues:  Issues making decisions:no  Less interest in hobbies / activities:no  Repeats questions, stories (family complaining):no  Trouble using ordinary gadgets (microwave, computer, phone):no  Forgets the month or year: no  Mismanaging finances: no  Remembering appts:no  Daily problems with thinking and/or memory: no Ad8  score is=0     MMSE - Mini Mental State Exam 08/12/2017  Orientation to time 5  Orientation to Place 5  Registration 3  Attention/ Calculation 5  Recall 1  Language- name 2 objects 2  Language- repeat 1  Language- follow 3 step command 3  Language- read & follow direction 1  Write a sentence 1  Copy design 1  Total score 28        Immunizations Immunization History  Administered Date(s) Administered  . Tdap 09/15/2012    TDAP status: Up to date Pneumococcal vaccine status: Declined,  Education has been provided regarding the importance of this vaccine but patient still declined. Advised may receive this vaccine at local pharmacy or Health Dept. Aware to provide a copy of the vaccination record if obtained from local pharmacy or Health Dept. Verbalized acceptance and understanding.  Covid-19 vaccine status: Completed vaccines   Screening Tests Health Maintenance  Topic Date Due  . COVID-19 Vaccine (1) Never done  . COLONOSCOPY  03/14/2017  . PAP SMEAR-Modifier  12/25/2018  . MAMMOGRAM  03/16/2021  . TETANUS/TDAP  09/16/2022  . Hepatitis C Screening  Completed  . HIV Screening  Completed    Health Maintenance  Health Maintenance Due  Topic Date Due  . COVID-19 Vaccine (1) Never done  . COLONOSCOPY  03/14/2017  . PAP SMEAR-Modifier  12/25/2018    Colon cancer screening:pt has appt w/ Dr.Danis 08/31/19.  Mammogram status: Completed 03/17/19. Repeat every year   Additional Screening:  Vision Screening: Recommended annual ophthalmology exams for early detection of glaucoma and other disorders of the eye. Is the patient up to date with their annual eye exam?  Yes    Dental Screening: Recommended annual dental exams for proper oral hygiene  Community Resource Referral / Chronic Care Management: CRR required this visit?  No   CCM required this visit?  No      Plan:   .See you next year!  Continue to eat heart healthy diet (full of fruits, vegetables,  whole grains, lean protein, water--limit salt, fat, and sugar intake) and increase physical activity as tolerated.  Continue doing brain stimulating activities (puzzles, reading, adult coloring books, staying active) to keep memory sharp.     I have personally reviewed and noted the following in the patient's chart:   . Medical and social history . Use of alcohol, tobacco or illicit drugs  . Current medications and supplements . Functional ability and status . Nutritional status . Physical activity . Advanced directives . List of other physicians . Hospitalizations, surgeries, and ER visits in previous 12 months . Vitals . Screenings to include cognitive, depression, and falls . Referrals and appointments  In addition, I have reviewed and discussed with patient certain preventive protocols, quality metrics, and best practice recommendations. A written personalized care plan for preventive services as well as general preventive health recommendations were provided to patient.   Due to this being a telephonic visit, the after visit summary with patients personalized plan was offered to patient via mail or my-chart.Patient would like to access on my-chart.   Naaman Plummer [place], [place]   08/23/2019

## 2019-08-23 ENCOUNTER — Ambulatory Visit (INDEPENDENT_AMBULATORY_CARE_PROVIDER_SITE_OTHER): Payer: Medicare Other | Admitting: *Deleted

## 2019-08-23 ENCOUNTER — Other Ambulatory Visit: Payer: Self-pay

## 2019-08-23 ENCOUNTER — Encounter: Payer: Self-pay | Admitting: *Deleted

## 2019-08-23 DIAGNOSIS — Z0000 Encounter for general adult medical examination without abnormal findings: Secondary | ICD-10-CM

## 2019-08-23 NOTE — Patient Instructions (Signed)
.See you next year!  Continue to eat heart healthy diet (full of fruits, vegetables, whole grains, lean protein, water--limit salt, fat, and sugar intake) and increase physical activity as tolerated.  Continue doing brain stimulating activities (puzzles, reading, adult coloring books, staying active) to keep memory sharp.     Samantha [name] , Thank you for taking time to come for your Medicare Wellness Visit. I appreciate your ongoing commitment to your health goals. Please review the following plan we discussed and let me know if I can assist you in the future.   These are the goals we discussed: Goals    . Quit smoking / using tobacco       This is a list of the screening recommended for you and due dates:  Health Maintenance  Topic Date Due  . Colon Cancer Screening  03/14/2017  . Pap Smear  12/25/2018  . Mammogram  03/16/2021  . Tetanus Vaccine  09/16/2022  . COVID-19 Vaccine  Completed  .  Hepatitis C: One time screening is recommended by Center for Disease Control  (CDC) for  adults born from [birth year] through 1964.   Completed  . HIV Screening  Completed    Preventive Care 30-37 Years Old, Female Preventive care refers to visits with your health care provider and lifestyle choices that can promote health and wellness. This includes:  A yearly physical exam. This may also be called an annual well check.  Regular dental visits and eye exams.  Immunizations.  Screening for certain conditions.  Healthy lifestyle choices, such as eating a healthy diet, getting regular exercise, not using drugs or products that contain nicotine and tobacco, and limiting alcohol use. What can I expect for my preventive care visit? Physical exam Your health care provider will check your:  Height and weight. This may be used to calculate body mass index (BMI), which tells if you are at a healthy weight.  Heart rate and blood pressure.  Skin for abnormal spots. Counseling Your health care  provider may ask you questions about your:  Alcohol, tobacco, and drug use.  Emotional well-being.  Home and relationship well-being.  Sexual activity.  Eating habits.  Work and work Statistician.  Method of birth control.  Menstrual cycle.  Pregnancy history. What immunizations do I need?  Influenza (flu) vaccine  This is recommended every year. Tetanus, diphtheria, and pertussis (Tdap) vaccine  You may need a Td booster every 10 years. Varicella (chickenpox) vaccine  You may need this if you have not been vaccinated. Zoster (shingles) vaccine  You may need this after age 21. Measles, mumps, and rubella (MMR) vaccine  You may need at least one dose of MMR if you were born in 1957 or later. You may also need a second dose. Pneumococcal conjugate (PCV13) vaccine  You may need this if you have certain conditions and were not previously vaccinated. Pneumococcal polysaccharide (PPSV23) vaccine  You may need one or two doses if you smoke cigarettes or if you have certain conditions. Meningococcal conjugate (MenACWY) vaccine  You may need this if you have certain conditions. Hepatitis A vaccine  You may need this if you have certain conditions or if you travel or work in places where you may be exposed to hepatitis A. Hepatitis B vaccine  You may need this if you have certain conditions or if you travel or work in places where you may be exposed to hepatitis B. Haemophilus influenzae type b (Hib) vaccine  You may need this if  you have certain conditions. Human papillomavirus (HPV) vaccine  If recommended by your health care provider, you may need three doses over 6 months. You may receive vaccines as individual doses or as more than one vaccine together in one shot (combination vaccines). Talk with your health care provider about the risks and benefits of combination vaccines. What tests do I need? Blood tests  Lipid and cholesterol levels. These may be checked  every 5 years, or more frequently if you are over 10 years old.  Hepatitis C test.  Hepatitis B test. Screening  Lung cancer screening. You may have this screening every year starting at age 2 if you have a 30-pack-year history of smoking and currently smoke or have quit within the past 15 years.  Colorectal cancer screening. All adults should have this screening starting at age 28 and continuing until age 13. Your health care provider may recommend screening at age 50 if you are at increased risk. You will have tests every 1-10 years, depending on your results and the type of screening test.  Diabetes screening. This is done by checking your blood sugar (glucose) after you have not eaten for a while (fasting). You may have this done every 1-3 years.  Mammogram. This may be done every 1-2 years. Talk with your health care provider about when you should start having regular mammograms. This may depend on whether you have a family history of breast cancer.  BRCA-related cancer screening. This may be done if you have a family history of breast, ovarian, tubal, or peritoneal cancers.  Pelvic exam and Pap test. This may be done every 3 years starting at age 38. Starting at age 58, this may be done every 5 years if you have a Pap test in combination with an HPV test. Other tests  Sexually transmitted disease (STD) testing.  Bone density scan. This is done to screen for osteoporosis. You may have this scan if you are at high risk for osteoporosis. Follow these instructions at home: Eating and drinking  Eat a diet that includes fresh fruits and vegetables, whole grains, lean protein, and low-fat dairy.  Take vitamin and mineral supplements as recommended by your health care provider.  Do not drink alcohol if: ? Your health care provider tells you not to drink. ? You are pregnant, may be pregnant, or are planning to become pregnant.  If you drink alcohol: ? Limit how much you have to 0-1  drink a day. ? Be aware of how much alcohol is in your drink. In the [place] one drink equals one 12 oz bottle of beer (355 mL), one 5 oz glass of wine (148 mL), or one 1 oz glass of hard liquor (44 mL). Lifestyle  Take daily care of your teeth and gums.  Stay active. Exercise for at least 30 minutes on 5 or more days each week.  Do not use any products that contain nicotine or tobacco, such as cigarettes, e-cigarettes, and chewing tobacco. If you need help quitting, ask your health care provider.  If you are sexually active, practice safe sex. Use a condom or other form of birth control (contraception) in order to prevent pregnancy and STIs (sexually transmitted infections).  If told by your health care provider, take low-dose aspirin daily starting at age 44. What's next?  Visit your health care provider once a year for a well check visit.  Ask your health care provider how often you should have your eyes and teeth checked.  Stay  up to date on all vaccines. This information is not intended to replace advice given to you by your health care provider. Make sure you discuss any questions you have with your health care provider. Document Revised: 09/25/2017 Document Reviewed: 09/25/2017 Elsevier Patient Education  2020 [employer].

## 2019-08-31 ENCOUNTER — Encounter: Payer: Self-pay | Admitting: Gastroenterology

## 2019-08-31 ENCOUNTER — Telehealth: Payer: Self-pay

## 2019-08-31 ENCOUNTER — Ambulatory Visit (INDEPENDENT_AMBULATORY_CARE_PROVIDER_SITE_OTHER): Payer: Medicare Other | Admitting: Gastroenterology

## 2019-08-31 DIAGNOSIS — Z86010 Personal history of colonic polyps: Secondary | ICD-10-CM

## 2019-08-31 DIAGNOSIS — Z7902 Long term (current) use of antithrombotics/antiplatelets: Secondary | ICD-10-CM | POA: Diagnosis not present

## 2019-08-31 DIAGNOSIS — I2510 Atherosclerotic heart disease of native coronary artery without angina pectoris: Secondary | ICD-10-CM | POA: Diagnosis not present

## 2019-08-31 NOTE — Progress Notes (Signed)
[facility] Progress Note  Chief Complaint: History of colon polyp  Subjective  History:  Samantha [name] follows up after her last visit with me in July 2020.  She had a history of colon polyp, but saw me really for abnormal findings on CT scan of the right colon in the setting of acute illness with sepsis and hypotension and MI.  That abnormal finding resolved on follow-up CT scan.  Last colonoscopy [facility]) Feb 2016 with 19mm splenic flexure TA (reportedly 57mm, but does not appear that large on photo, and path specimen measured at 73mm) - few HPs as well. Complete exam to cecum with excellent prep  She denies change in bowel habits, abdominal pain or rectal bleeding.  ROS: Cardiovascular:  no chest pain Respiratory: no dyspnea Generally feels well other than arthralgias and fatigue Remainder of systems negative except as above The patient's Past Medical, Family and Social History were reviewed and are on file in the EMR.  Past Medical History:  Diagnosis Date  . Altered mental status   . Backache, unspecified   . Gastric ulcer, unspecified as acute or chronic, without mention of hemorrhage, perforation, or obstruction   . Helicobacter pylori (H. pylori)   . Hyperlipidemia   . Lupus ([place])   . STEMI (ST elevation myocardial infarction) ([place])    PCI to p/d RCA  . Tobacco abuse   . Tobacco use disorder 11/22/2011  . Unspecified essential hypertension    Most recent Cardiology note 04/30/19 reported no angina, changed brilinta to plavix as DAPT with ASA  Gets IVIG for SLE ([facility])  Objective:  Med list reviewed  Current Outpatient Medications:  .  acetaminophen (TYLENOL) 500 MG tablet, Take 500 mg by mouth every 6 (six) hours as needed for moderate pain or headache., Disp: , Rfl:  .  aspirin EC 81 MG tablet, Take 1 tablet (81 mg total) by mouth daily., Disp: , Rfl:  .  atorvastatin (LIPITOR) 80 MG tablet, TAKE 1 TABLET (80 MG TOTAL) BY MOUTH DAILY AT 6 PM.,  Disp: 90 tablet, Rfl: 3 .  clopidogrel (PLAVIX) 75 MG tablet, Take 1 tablet (75 mg total) by mouth daily., Disp: 90 tablet, Rfl: 3 .  cyclobenzaprine (FLEXERIL) 10 MG tablet, TAKE 1 TABLET(10 MG) BY MOUTH THREE TIMES DAILY AS NEEDED (Patient taking differently: Take 10 mg by mouth 3 (three) times daily as needed for muscle spasms. ), Disp: 30 tablet, Rfl: 0 .  hydroxychloroquine (PLAQUENIL) 200 MG tablet, Take 300 mg by mouth daily. , Disp: , Rfl:  .  loperamide (IMODIUM A-D) 2 MG tablet, Take 1 tablet (2 mg total) by mouth 4 (four) times daily as needed for diarrhea or loose stools., Disp: 30 tablet, Rfl: 0 .  losartan (COZAAR) 50 MG tablet, Take 1 tablet (50 mg total) by mouth daily., Disp: 90 tablet, Rfl: 3 .  metoprolol succinate (TOPROL-XL) 100 MG 24 hr tablet, Take 1 tablet (100 mg total) by mouth daily. Immediately with or following a meal., Disp: 90 tablet, Rfl: 1 .  metoprolol tartrate (LOPRESSOR) 25 MG tablet, TAKE 1 TABLET BY MOUTH TWICE A DAY, Disp: 180 tablet, Rfl: 3 .  Multiple Vitamin (MULTIVITAMIN) tablet, Take 1 tablet by mouth daily., Disp: , Rfl:  .  nitroGLYCERIN (NITROSTAT) 0.4 MG SL tablet, PLACE 1 TABLET (0.4 MG TOTAL) UNDER THE TONGUE EVERY 5 (FIVE) MINUTES AS NEEDED FOR CHEST PAIN., Disp: 75 tablet, Rfl: 2 .  pantoprazole (PROTONIX) 20 MG tablet, TAKE 1 TABLET BY  MOUTH EVERY DAY, Disp: 90 tablet, Rfl: 1 .  spironolactone (ALDACTONE) 25 MG tablet, TAKE 1 TABLET BY MOUTH EVERY DAY, Disp: 90 tablet, Rfl: 1 .  Cetirizine HCl 10 MG CAPS, Take 1 capsule (10 mg total) by mouth daily for 10 days., Disp: 10 capsule, Rfl: 0  Of note, at January 2021 clinic visit with [facility] rheumatology, the patient was also on CellCept.  Vital signs in last 24 hrs: Vitals:   08/31/19 0903  BP: 102/74  Pulse: 68    Physical Exam  Smells of cigarette smoke, but says that was because she rode in a car with a cousin earlier today.  She reports having stop smoking. Poor muscle mass  HEENT: sclera  anicteric, oral mucosa moist without lesions  Neck: supple, no thyromegaly, JVD or lymphadenopathy  Cardiac: RRR without murmurs, S1S2 heard, no peripheral edema  Pulm: clear to auscultation bilaterally, normal RR and effort noted  Abdomen: soft, no tenderness, with active bowel sounds. No guarding or palpable hepatosplenomegaly.  Skin; warm and dry, no jaundice or rash  Labs:   ___________________________________________ Radiologic studies:   ____________________________________________ Other: 05/2018 Echo:  IMPRESSIONS     1. The left ventricle has low normal systolic function, with an ejection  fraction of 50-55%. The cavity size was normal. Left ventricular diastolic  Doppler parameters are consistent with impaired relaxation.   2. LVEF is approximately 50 to 55% with severe hypokinesis of the  inferor/inferoseptal walls.   3. The right ventricle has mildly reduced systolic function. The cavity  was mildly enlarged. There is no increase in right ventricular wall  thickness.   4. The inferior vena cava was dilated in size with <50% respiratory  variability.   5. The interatrial septum was not assessed    _____________________________________________ Assessment & Plan  Assessment: Encounter Diagnoses  Name Primary?  . Personal history of colonic polyps   . Long term (current) use of antithrombotics/antiplatelets     History of colon polyp, due for surveillance colonoscopy.  She is recovered from her MI with critical illness in 2020. Long-term Plavix use, RCA stents were over a year ago.  Cardiology will likely be agreeable to a 5-day hold of the Plavix if we continue aspirin prior to colonoscopy.  We will check with them to be sure.   Plan:  Colonoscopy planned.  Patient agreeable after discussion of procedure and risks.  The benefits and risks of the planned procedure were described in detail with the patient or (when appropriate) their health care proxy.   Risks were outlined as including, but not limited to, bleeding, infection, perforation, adverse medication reaction leading to cardiac or pulmonary decompensation, pancreatitis (if ERCP).  The limitation of incomplete mucosal visualization was also discussed.  No guarantees or warranties were given.   30 minutes were spent on this encounter (including chart review, history/exam, counseling/coordination of care, and documentation)  Nelida Meuse III

## 2019-08-31 NOTE — Patient Instructions (Signed)
If you are age 58 or older, your body mass index should be between 23-30. Your Body mass index is 23.47 kg/m. If this is out of the aforementioned range listed, please consider follow up with your Primary Care Provider.  If you are age 66 or younger, your body mass index should be between 19-25. Your Body mass index is 23.47 kg/m. If this is out of the aformentioned range listed, please consider follow up with your Primary Care Provider.   You have been scheduled for a colonoscopy. Please follow written instructions given to you at your visit today.  Please pick up your prep supplies at the pharmacy within the next 1-3 days. If you use inhalers (even only as needed), please bring them with you on the day of your procedure.  You will be contacted by our office prior to your procedure for directions on holding your Plavix.  If you do not hear from our office 1 week prior to your scheduled procedure, please call [phone] to discuss.   Due to recent changes in healthcare laws, you may see the results of your imaging and laboratory studies on MyChart before your provider has had a chance to review them.  We understand that in some cases there may be results that are confusing or concerning to you. Not all laboratory results come back in the same time frame and the provider may be waiting for multiple results in order to interpret others.  Please give [place] 48 hours in order for your provider to thoroughly review all the results before contacting the office for clarification of your results.   It was a pleasure to see you today!  Dr. Loletha Carrow

## 2019-08-31 NOTE — Telephone Encounter (Signed)
Attempted to call the patient, she did not pick up.   Dr. Angelena Form to review. Patient had a inferior STEMI in May 2020, had 2 DES to RCA. She is >1 year out from her stent placement. Can the patient hold Plavix for 5 days prior to colonoscopy?  Please send your response to P [facility] PREOP

## 2019-08-31 NOTE — Telephone Encounter (Signed)
[facility] Pre-operative Risk Assessment     Request for surgical clearance:     Endoscopy Procedure  What type of surgery is being performed?     Colonoscopy  When is this surgery scheduled?     09-30-19  What type of clearance is required ?   Pharmacy  Are there any medications that need to be held prior to surgery and how long? Plavix x 5 days  Practice name and name of physician performing surgery?      [facility]  What is your office phone and fax number?      Phone- [phone]  [phone]  Anesthesia type (None, local, MAC, general) ?       MAC

## 2019-09-01 NOTE — Telephone Encounter (Signed)
Ok to hold Plavix.

## 2019-09-01 NOTE — Telephone Encounter (Signed)
   Primary Cardiologist: Lauree Chandler, MD  Chart reviewed as part of pre-operative protocol coverage. Patient was contacted 09/01/2019 in reference to pre-operative risk assessment for pending surgery as outlined below.  Samantha [name] was last seen on 04/30/19 by Dr. Angelena Form for CAD with MI 05/2018 with stent placement.  Since that day, Samantha [name] has done well with no chest pain or SOB.   Pt may hold plavix for 5 days prior to procedure and resume post procedure when safe.    Therefore, based on ACC/AHA guidelines, the patient would be at acceptable risk for the planned procedure without further cardiovascular testing.   I will route this recommendation to the requesting party via Epic fax function and remove from pre-op pool. Please call with questions.  Cecilie Kicks, NP 09/01/2019, 11:57 AM

## 2019-09-01 NOTE — Telephone Encounter (Signed)
Patient was originally scheduled for 09-30-19 for a colonoscopy.  Patient had to reschedule to 10-12-19 due to transportation issues.  Patient advised to hold Plavix starting on 10-07-19.  Her last dose on Plavix will be taken on 10-06-19.  Patient agreed to plan and verbalized understanding.  No further questions.

## 2019-09-10 ENCOUNTER — Other Ambulatory Visit: Payer: Self-pay | Admitting: Cardiovascular Disease

## 2019-09-13 ENCOUNTER — Other Ambulatory Visit: Payer: Self-pay | Admitting: *Deleted

## 2019-09-13 DIAGNOSIS — Z87891 Personal history of nicotine dependence: Secondary | ICD-10-CM

## 2019-09-13 DIAGNOSIS — F17210 Nicotine dependence, cigarettes, uncomplicated: Secondary | ICD-10-CM

## 2019-09-20 ENCOUNTER — Ambulatory Visit (HOSPITAL_COMMUNITY)
Admission: RE | Admit: 2019-09-20 | Discharge: 2019-09-20 | Disposition: A | Discharge status: Home / Self Care | Payer: Medicare Other | Source: Ambulatory Visit | Attending: Internal Medicine | Admitting: Internal Medicine

## 2019-09-20 ENCOUNTER — Other Ambulatory Visit: Payer: Self-pay

## 2019-09-20 DIAGNOSIS — M3219 Other organ or system involvement in systemic lupus erythematosus: Secondary | ICD-10-CM | POA: Insufficient documentation

## 2019-09-20 MED ORDER — ACETAMINOPHEN 325 MG PO TABS
650.0000 mg | ORAL_TABLET | ORAL | Status: DC
  Administered 2019-09-20: 650 mg via ORAL

## 2019-09-20 MED ORDER — IMMUNE GLOBULIN (HUMAN) 10 GM/100ML IV SOLN
70.0000 g | INTRAVENOUS | Status: DC
  Administered 2019-09-20: 70 g via INTRAVENOUS
  Filled 2019-09-20: qty 600

## 2019-09-20 MED ORDER — DIPHENHYDRAMINE HCL 25 MG PO CAPS
ORAL_CAPSULE | ORAL | Status: AC
  Filled 2019-09-20: qty 1

## 2019-09-20 MED ORDER — ACETAMINOPHEN 325 MG PO TABS
ORAL_TABLET | ORAL | Status: AC
  Filled 2019-09-20: qty 2

## 2019-09-20 MED ORDER — DIPHENHYDRAMINE HCL 25 MG PO CAPS
25.0000 mg | ORAL_CAPSULE | ORAL | Status: DC
  Administered 2019-09-20: 25 mg via ORAL

## 2019-09-21 DIAGNOSIS — N281 Cyst of kidney, acquired: Secondary | ICD-10-CM | POA: Diagnosis not present

## 2019-09-24 DIAGNOSIS — Z79899 Other long term (current) drug therapy: Secondary | ICD-10-CM | POA: Diagnosis not present

## 2019-09-24 DIAGNOSIS — G6189 Other inflammatory polyneuropathies: Secondary | ICD-10-CM | POA: Diagnosis not present

## 2019-09-24 DIAGNOSIS — M3219 Other organ or system involvement in systemic lupus erythematosus: Secondary | ICD-10-CM | POA: Diagnosis not present

## 2019-09-25 ENCOUNTER — Other Ambulatory Visit: Payer: Self-pay | Admitting: Cardiovascular Disease

## 2019-09-27 ENCOUNTER — Ambulatory Visit
Admission: RE | Admit: 2019-09-27 | Discharge: 2019-09-27 | Disposition: A | Discharge status: Home / Self Care | Payer: Medicare Other | Source: Ambulatory Visit | Attending: Acute Care | Admitting: Acute Care

## 2019-09-27 DIAGNOSIS — F17210 Nicotine dependence, cigarettes, uncomplicated: Secondary | ICD-10-CM

## 2019-09-27 DIAGNOSIS — Z87891 Personal history of nicotine dependence: Secondary | ICD-10-CM

## 2019-09-30 ENCOUNTER — Encounter: Payer: Medicare Other | Admitting: Gastroenterology

## 2019-10-05 DIAGNOSIS — Z23 Encounter for immunization: Secondary | ICD-10-CM | POA: Diagnosis not present

## 2019-10-05 NOTE — Progress Notes (Signed)
Please call patient and let them  know their  low dose Ct was read as a Lung RADS 1, negative study: no nodules or definitely benign nodules. Radiology recommendation is for a repeat LDCT in 12 months. .Please let them  know we will order and schedule their  annual screening scan for 08/2020. Please let them  know there was notation of CAD on their  scan.  Please remind the patient  that this is a non-gated exam therefore degree or severity of disease  cannot be determined. Please have them  follow up with their PCP regarding potential risk factor modification, dietary therapy or pharmacologic therapy if clinically indicated. Pt.  is  currently on statin therapy. Please place order for annual  screening scan for  08/2020 and fax results to PCP. Thanks so much.

## 2019-10-08 ENCOUNTER — Other Ambulatory Visit: Payer: Self-pay | Admitting: *Deleted

## 2019-10-08 DIAGNOSIS — F17210 Nicotine dependence, cigarettes, uncomplicated: Secondary | ICD-10-CM

## 2019-10-08 DIAGNOSIS — Z87891 Personal history of nicotine dependence: Secondary | ICD-10-CM

## 2019-10-12 ENCOUNTER — Encounter: Payer: Self-pay | Admitting: Gastroenterology

## 2019-10-12 ENCOUNTER — Other Ambulatory Visit: Payer: Self-pay

## 2019-10-12 ENCOUNTER — Ambulatory Visit (AMBULATORY_SURGERY_CENTER): Payer: Medicare Other | Admitting: Gastroenterology

## 2019-10-12 VITALS — BP 145/82 | HR 80 | Temp 97.5°F | Resp 22 | Ht 67.5 in | Wt 152.0 lb

## 2019-10-12 DIAGNOSIS — Z860100 Personal history of colon polyps, unspecified: Secondary | ICD-10-CM

## 2019-10-12 DIAGNOSIS — D123 Benign neoplasm of transverse colon: Secondary | ICD-10-CM

## 2019-10-12 DIAGNOSIS — K635 Polyp of colon: Secondary | ICD-10-CM | POA: Diagnosis not present

## 2019-10-12 DIAGNOSIS — D122 Benign neoplasm of ascending colon: Secondary | ICD-10-CM

## 2019-10-12 DIAGNOSIS — D124 Benign neoplasm of descending colon: Secondary | ICD-10-CM

## 2019-10-12 HISTORY — PX: COLONOSCOPY: SHX174

## 2019-10-12 MED ORDER — SODIUM CHLORIDE 0.9 % IV SOLN: 500.0000 mL | Freq: Once | INTRAVENOUS | Status: DC

## 2019-10-12 NOTE — Progress Notes (Signed)
pt tolerated well. VSS. awake and to recovery. Report given to RN.  

## 2019-10-12 NOTE — Progress Notes (Signed)
Pt's states no medical or surgical changes since previsit or office visit.  Vitals [place] 

## 2019-10-12 NOTE — Op Note (Signed)
[facility] Patient Name: Samantha [name] Procedure Date: 10/12/2019 12:29 PM MRN: [id number] Endoscopist: Samantha [name] , MD Age: 59 Referring MD:  Date of Birth: May 28, 1960 Gender: Female Account #: [id number] Procedure:                Colonoscopy Indications:              Surveillance: Personal history of adenomatous                            polyps on last colonoscopy > 5 years ago (TA x                            1;02/2014) Medicines:                Monitored Anesthesia Care Procedure:                Pre-Anesthesia Assessment:                           - Prior to the procedure, a History and Physical                            was performed, and patient medications and                            allergies were reviewed. The patient's tolerance of                            previous anesthesia was also reviewed. The risks                            and benefits of the procedure and the sedation                            options and risks were discussed with the patient.                            All questions were answered, and informed consent                            was obtained. Prior Anticoagulants: The patient has                            taken Plavix (clopidogrel), last dose was 5 days                            prior to procedure. ASA Grade Assessment: III - A                            patient with severe systemic disease. After                            reviewing the risks and benefits, the patient was  deemed in satisfactory condition to undergo the                            procedure.                           After obtaining informed consent, the colonoscope                            was passed under direct vision. Throughout the                            procedure, the patient's blood pressure, pulse, and                            oxygen saturations were monitored continuously. The                             Colonoscope was introduced through the anus and                            advanced to the the cecum, identified by                            appendiceal orifice and ileocecal valve. The                            colonoscopy was performed without difficulty. The                            patient tolerated the procedure well. The quality                            of the bowel preparation was excellent. The                            ileocecal valve, appendiceal orifice, and rectum                            were photographed. The bowel preparation used was                            Plenvu. Scope In: 1:39:47 PM Scope Out: 2:04:10 PM Scope Withdrawal Time: 0 hours 18 minutes 15 seconds  Total Procedure Duration: 0 hours 24 minutes 23 seconds  Findings:                 The perianal and digital rectal examinations were                            normal.                           Diverticula were found in the left colon.  A 5 mm polyp was found in the descending colon. The                            polyp was sessile. The polyp was removed with a                            cold snare. Resection and retrieval were complete.                           A 5 mm polyp was found in the ascending colon. The                            polyp was sessile. The polyp was removed with a                            cold snare. Resection and retrieval were complete.                           A 15 mm polyp was found in the proximal descending                            colon. The polyp was semi-sessile. The polyp was                            removed with a piecemeal technique using a cold                            snare. Resection and retrieval were complete. To                            prevent bleeding post-intervention, one hemostatic                            clip was successfully placed (MR conditional).                           Internal hemorrhoids were found. The  hemorrhoids                            were Grade I (internal hemorrhoids that do not                            prolapse).                           The exam was otherwise without abnormality on                            direct and retroflexion views. Complications:            No immediate complications. Estimated Blood Loss:     Estimated blood loss was minimal. Impression:               - Diverticulosis in the left  colon.                           - One 5 mm polyp in the descending colon, removed                            with a cold snare. Resected and retrieved.                           - One 5 mm polyp in the ascending colon, removed                            with a cold snare. Resected and retrieved.                           - One 15 mm polyp in the proximal descending colon,                            removed piecemeal using a cold snare. Resected and                            retrieved. Clip (MR conditional) was placed.                           - Internal hemorrhoids.                           - The examination was otherwise normal on direct                            and retroflexion views. Recommendation:           - Patient has a contact number available for                            emergencies. The signs and symptoms of potential                            delayed complications were discussed with the                            patient. Return to normal activities tomorrow.                            Written discharge instructions were provided to the                            patient.                           - Resume previous diet.                           - Resume Plavix (clopidogrel) at prior dose  tomorrow.                           - Await pathology results.                           - Repeat colonoscopy is recommended for                            surveillance. The colonoscopy date will be                            determined  after pathology results from today's                            exam become available for review. Samantha Pfund L. Loletha Carrow, MD 10/12/2019 2:12:33 PM This report has been signed electronically.

## 2019-10-12 NOTE — Patient Instructions (Signed)
You may take your plavix tomorrow at the previous dose.   Read all of the handouts given to you by your recovery room nurse.  Thank-you for choosing [place] for your healthcare needs today.  YOU HAD AN ENDOSCOPIC PROCEDURE TODAY AT [facility]:   Refer to the procedure report that was given to you for any specific questions about what was found during the examination.  If the procedure report does not answer your questions, please call your gastroenterologist to clarify.  If you requested that your care partner not be given the details of your procedure findings, then the procedure report has been included in a sealed envelope for you to review at your convenience later.  YOU SHOULD EXPECT: Some feelings of bloating in the abdomen. Passage of more gas than usual.  Walking can help get rid of the air that was put into your GI tract during the procedure and reduce the bloating. If you had a lower endoscopy (such as a colonoscopy or flexible sigmoidoscopy) you may notice spotting of blood in your stool or on the toilet paper. If you underwent a bowel prep for your procedure, you may not have a normal bowel movement for a few days.  Please Note:  You might notice some irritation and congestion in your nose or some drainage.  This is from the oxygen used during your procedure.  There is no need for concern and it should clear up in a day or so.  SYMPTOMS TO REPORT IMMEDIATELY:   Following lower endoscopy (colonoscopy or flexible sigmoidoscopy):  Excessive amounts of blood in the stool  Significant tenderness or worsening of abdominal pains  Swelling of the abdomen that is new, acute  Fever of 100F or higher   For urgent or emergent issues, a gastroenterologist can be reached at any hour by calling [phone]. Do not use MyChart messaging for urgent concerns.    DIET:  We do recommend a small meal at first, but then you may proceed to your regular diet.  Drink plenty of fluids but  you should avoid alcoholic beverages for 24 hours. Try to increase the fiber in your diet, and drink plenty of water.  ACTIVITY:  You should plan to take it easy for the rest of today and you should NOT DRIVE or use heavy machinery until tomorrow (because of the sedation medicines used during the test).    FOLLOW UP: Our staff will call the number listed on your records 48-72 hours following your procedure to check on you and address any questions or concerns that you may have regarding the information given to you following your procedure. If we do not reach you, we will leave a message.  We will attempt to reach you two times.  During this call, we will ask if you have developed any symptoms of COVID 19. If you develop any symptoms (ie: fever, flu-like symptoms, shortness of breath, cough etc.) before then, please call [phone].  If you test positive for Covid 19 in the 2 weeks post procedure, please call and report this information to [place].    If any biopsies were taken you will be contacted by phone or by letter within the next 1-3 weeks.  Please call us at [phone] if you have not heard about the biopsies in 3 weeks.    SIGNATURES/CONFIDENTIALITY: You and/or your care partner have signed paperwork which will be entered into your electronic medical record.  These signatures attest to the fact that  that the information above on your After Visit Summary has been reviewed and is understood.  Full responsibility of the confidentiality of this discharge information lies with you and/or your care-partner.

## 2019-10-14 ENCOUNTER — Telehealth: Payer: Self-pay

## 2019-10-14 NOTE — Telephone Encounter (Signed)
  Follow up Call-  Call back number 10/12/2019  Post procedure Call Back phone  # [phone]  Permission to leave phone message Yes  Some recent data might be hidden     Patient questions:  Do you have a fever, pain , or abdominal swelling? No. Pain Score  0 *  Have you tolerated food without any problems? Yes.    Have you been able to return to your normal activities? Yes.    Do you have any questions about your discharge instructions: Diet   No. Medications  No. Follow up visit  No.  Do you have questions or concerns about your Care? No.  Actions: * If pain score is 4 or above: 1. No action needed, pain <4.Have you developed a fever since your procedure? no  2.   Have you had an respiratory symptoms (SOB or cough) since your procedure? no  3.   Have you tested positive for COVID 19 since your procedure no  4.   Have you had any family members/close contacts diagnosed with the COVID 19 since your procedure?  no   If yes to any of these questions please route to Joylene John, RN and Joella Prince, RN

## 2019-10-19 ENCOUNTER — Encounter: Payer: Self-pay | Admitting: Gastroenterology

## 2019-11-15 ENCOUNTER — Ambulatory Visit (HOSPITAL_COMMUNITY)
Admission: RE | Admit: 2019-11-15 | Discharge: 2019-11-15 | Disposition: A | Discharge status: Home / Self Care | Payer: Medicare (Managed Care) | Source: Ambulatory Visit | Attending: Internal Medicine | Admitting: Internal Medicine

## 2019-11-15 ENCOUNTER — Other Ambulatory Visit: Payer: Self-pay

## 2019-11-15 DIAGNOSIS — G609 Hereditary and idiopathic neuropathy, unspecified: Secondary | ICD-10-CM | POA: Diagnosis not present

## 2019-11-15 DIAGNOSIS — M329 Systemic lupus erythematosus, unspecified: Secondary | ICD-10-CM | POA: Diagnosis present

## 2019-11-15 MED ORDER — DIPHENHYDRAMINE HCL 25 MG PO CAPS
25.0000 mg | ORAL_CAPSULE | ORAL | Status: DC
  Administered 2019-11-15: 25 mg via ORAL

## 2019-11-15 MED ORDER — DIPHENHYDRAMINE HCL 25 MG PO CAPS
ORAL_CAPSULE | ORAL | Status: AC
  Filled 2019-11-15: qty 1

## 2019-11-15 MED ORDER — ACETAMINOPHEN 325 MG PO TABS
ORAL_TABLET | ORAL | Status: AC
  Filled 2019-11-15: qty 2

## 2019-11-15 MED ORDER — IMMUNE GLOBULIN (HUMAN) 10 GM/100ML IV SOLN
70.0000 g | INTRAVENOUS | Status: DC
  Administered 2019-11-15: 70 g via INTRAVENOUS
  Filled 2019-11-15: qty 100

## 2019-11-15 MED ORDER — ACETAMINOPHEN 325 MG PO TABS
650.0000 mg | ORAL_TABLET | ORAL | Status: DC
  Administered 2019-11-15: 650 mg via ORAL

## 2020-01-10 ENCOUNTER — Ambulatory Visit (HOSPITAL_COMMUNITY)
Admission: RE | Admit: 2020-01-10 | Discharge: 2020-01-10 | Disposition: A | Discharge status: Home / Self Care | Payer: Medicare (Managed Care) | Source: Ambulatory Visit | Attending: Internal Medicine | Admitting: Internal Medicine

## 2020-01-10 ENCOUNTER — Other Ambulatory Visit: Payer: Self-pay

## 2020-01-10 DIAGNOSIS — G6189 Other inflammatory polyneuropathies: Secondary | ICD-10-CM | POA: Diagnosis not present

## 2020-01-10 DIAGNOSIS — Z79899 Other long term (current) drug therapy: Secondary | ICD-10-CM | POA: Diagnosis not present

## 2020-01-10 DIAGNOSIS — M3210 Systemic lupus erythematosus, organ or system involvement unspecified: Secondary | ICD-10-CM | POA: Insufficient documentation

## 2020-01-10 MED ORDER — DIPHENHYDRAMINE HCL 25 MG PO CAPS
ORAL_CAPSULE | ORAL | Status: AC
  Filled 2020-01-10: qty 1

## 2020-01-10 MED ORDER — ACETAMINOPHEN 325 MG PO TABS
ORAL_TABLET | ORAL | Status: AC
  Filled 2020-01-10: qty 2

## 2020-01-10 MED ORDER — IMMUNE GLOBULIN (HUMAN) 10 GM/100ML IV SOLN
70.0000 g | INTRAVENOUS | Status: DC
  Administered 2020-01-10: 70 g via INTRAVENOUS
  Filled 2020-01-10: qty 600

## 2020-01-10 MED ORDER — DIPHENHYDRAMINE HCL 25 MG PO CAPS
25.0000 mg | ORAL_CAPSULE | ORAL | Status: DC
  Administered 2020-01-10: 25 mg via ORAL

## 2020-01-10 MED ORDER — ACETAMINOPHEN 325 MG PO TABS
650.0000 mg | ORAL_TABLET | ORAL | Status: DC
  Administered 2020-01-10: 650 mg via ORAL

## 2020-01-19 ENCOUNTER — Other Ambulatory Visit: Payer: Self-pay | Admitting: Family Medicine

## 2020-01-19 DIAGNOSIS — I10 Essential (primary) hypertension: Secondary | ICD-10-CM

## 2020-03-06 ENCOUNTER — Other Ambulatory Visit: Payer: Self-pay

## 2020-03-06 ENCOUNTER — Ambulatory Visit (HOSPITAL_COMMUNITY)
Admission: RE | Admit: 2020-03-06 | Discharge: 2020-03-06 | Disposition: A | Discharge status: Home / Self Care | Payer: Medicare (Managed Care) | Source: Ambulatory Visit | Attending: Internal Medicine | Admitting: Internal Medicine

## 2020-03-06 DIAGNOSIS — M3219 Other organ or system involvement in systemic lupus erythematosus: Secondary | ICD-10-CM | POA: Insufficient documentation

## 2020-03-06 MED ORDER — DIPHENHYDRAMINE HCL 25 MG PO CAPS
25.0000 mg | ORAL_CAPSULE | ORAL | Status: DC
  Administered 2020-03-06: 25 mg via ORAL

## 2020-03-06 MED ORDER — DIPHENHYDRAMINE HCL 25 MG PO CAPS
ORAL_CAPSULE | ORAL | Status: AC
  Filled 2020-03-06: qty 1

## 2020-03-06 MED ORDER — IMMUNE GLOBULIN (HUMAN) 10 GM/100ML IV SOLN
70.0000 g | INTRAVENOUS | Status: DC
  Administered 2020-03-06: 70 g via INTRAVENOUS
  Filled 2020-03-06: qty 200

## 2020-03-06 MED ORDER — ACETAMINOPHEN 325 MG PO TABS
ORAL_TABLET | ORAL | Status: AC
  Filled 2020-03-06: qty 2

## 2020-03-06 MED ORDER — ACETAMINOPHEN 325 MG PO TABS
650.0000 mg | ORAL_TABLET | ORAL | Status: DC
  Administered 2020-03-06: 650 mg via ORAL

## 2020-04-01 ENCOUNTER — Other Ambulatory Visit: Payer: Self-pay | Admitting: Family Medicine

## 2020-04-01 DIAGNOSIS — I10 Essential (primary) hypertension: Secondary | ICD-10-CM

## 2020-04-04 ENCOUNTER — Other Ambulatory Visit: Payer: Self-pay | Admitting: Family Medicine

## 2020-04-04 DIAGNOSIS — I10 Essential (primary) hypertension: Secondary | ICD-10-CM

## 2020-04-04 MED ORDER — METOPROLOL SUCCINATE ER 100 MG PO TB24: 100.0000 mg | ORAL_TABLET | Freq: Every day | ORAL | 1 refills | Status: DC

## 2020-04-04 NOTE — Telephone Encounter (Signed)
Last one filled was 100 mg ---- refilled

## 2020-04-17 ENCOUNTER — Other Ambulatory Visit: Payer: Self-pay | Admitting: Cardiovascular Disease

## 2020-04-17 ENCOUNTER — Other Ambulatory Visit: Payer: Self-pay | Admitting: Physician Assistant

## 2020-05-01 ENCOUNTER — Encounter (HOSPITAL_COMMUNITY): Payer: Self-pay

## 2020-05-01 ENCOUNTER — Other Ambulatory Visit: Payer: Self-pay

## 2020-05-01 ENCOUNTER — Ambulatory Visit (HOSPITAL_COMMUNITY)
Admission: RE | Admit: 2020-05-01 | Discharge: 2020-05-01 | Disposition: A | Discharge status: Home / Self Care | Payer: Medicare (Managed Care) | Source: Ambulatory Visit | Attending: Internal Medicine | Admitting: Internal Medicine

## 2020-05-03 ENCOUNTER — Encounter (HOSPITAL_COMMUNITY)
Admission: RE | Admit: 2020-05-03 | Discharge: 2020-05-03 | Disposition: A | Discharge status: Home / Self Care | Payer: Medicare (Managed Care) | Source: Ambulatory Visit | Attending: Rheumatology | Admitting: Rheumatology

## 2020-05-03 ENCOUNTER — Other Ambulatory Visit: Payer: Self-pay

## 2020-05-03 ENCOUNTER — Inpatient Hospital Stay (HOSPITAL_COMMUNITY): Admission: RE | Admit: 2020-05-03 | Payer: Medicare (Managed Care) | Source: Ambulatory Visit

## 2020-05-03 DIAGNOSIS — M329 Systemic lupus erythematosus, unspecified: Secondary | ICD-10-CM | POA: Insufficient documentation

## 2020-05-03 DIAGNOSIS — G64 Other disorders of peripheral nervous system: Secondary | ICD-10-CM | POA: Insufficient documentation

## 2020-05-03 MED ORDER — DIPHENHYDRAMINE HCL 25 MG PO CAPS
25.0000 mg | ORAL_CAPSULE | ORAL | Status: DC
  Administered 2020-05-03: 25 mg via ORAL

## 2020-05-03 MED ORDER — ACETAMINOPHEN 325 MG PO TABS
650.0000 mg | ORAL_TABLET | ORAL | Status: DC
  Administered 2020-05-03: 650 mg via ORAL

## 2020-05-03 MED ORDER — DIPHENHYDRAMINE HCL 25 MG PO CAPS
ORAL_CAPSULE | ORAL | Status: AC
  Filled 2020-05-03: qty 1

## 2020-05-03 MED ORDER — ACETAMINOPHEN 325 MG PO TABS
ORAL_TABLET | ORAL | Status: AC
  Filled 2020-05-03: qty 2

## 2020-05-03 MED ORDER — IMMUNE GLOBULIN (HUMAN) 10 GM/100ML IV SOLN
70.0000 g | INTRAVENOUS | Status: DC
  Administered 2020-05-03: 70 g via INTRAVENOUS
  Filled 2020-05-03: qty 600

## 2020-05-04 ENCOUNTER — Encounter (HOSPITAL_COMMUNITY): Payer: Medicare (Managed Care)

## 2020-05-10 IMAGING — CR PORTABLE CHEST - 1 VIEW
1 series · 1 of 1 positions shown · non-contrast
Comparison: Portable exam 4797 hours compared to 05/30/2018 at 6765
hours

CLINICAL DATA: Intubation, history hypertension, lupus, smoker

EXAM:
PORTABLE CHEST 1 VIEW

[AP]
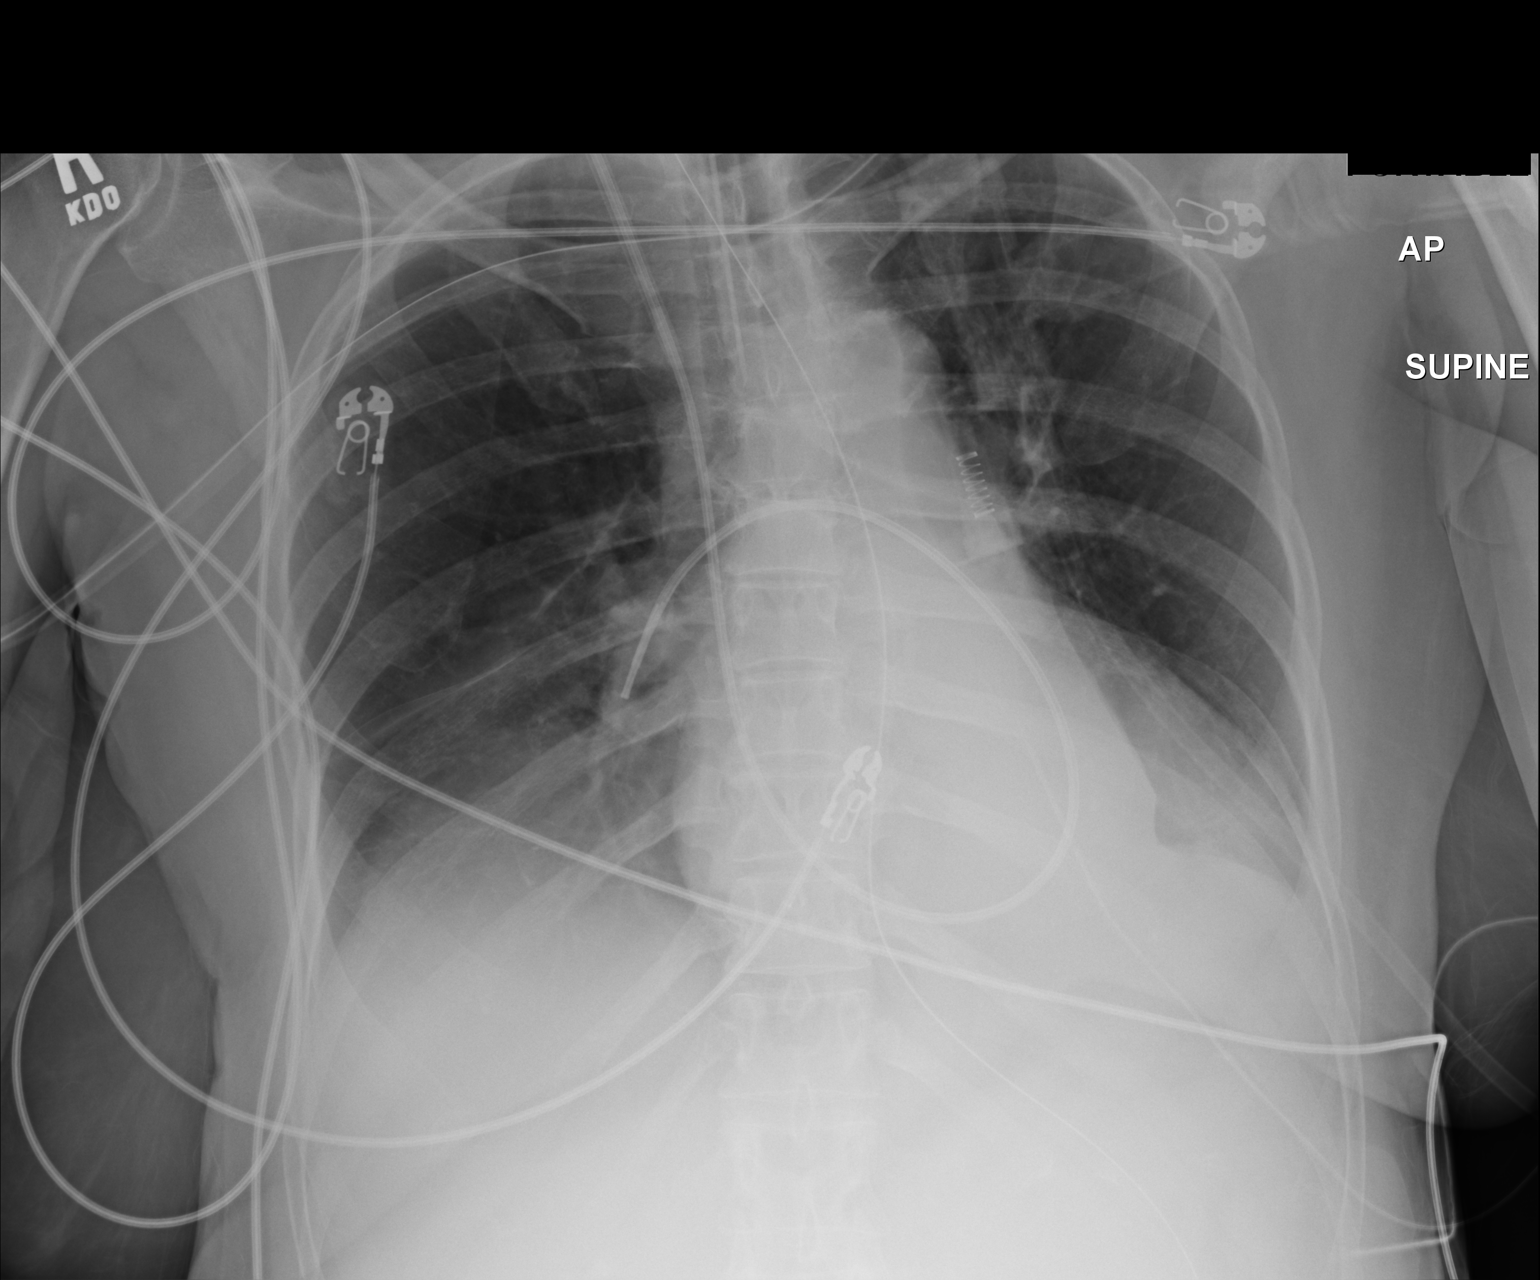

[1 of 1 positions shown; findings below may reference images not displayed]

FINDINGS: Tip of endotracheal tube projects 2.8 cm above carina.

RIGHT jugular Swan-Ganz catheter with tip projecting over RIGHT
lower lobe pulmonary artery.

Nasogastric tube extends into stomach.

Normal heart size, mediastinal contours, and pulmonary vascularity.

Atherosclerotic calcification aorta.

Bibasilar atelectasis greater on LEFT.

No gross pleural effusion or pneumothorax.
IMPRESSION: Line and tube positions as above.

Bibasilar atelectasis greater on LEFT.

## 2020-06-27 ENCOUNTER — Other Ambulatory Visit: Payer: Self-pay

## 2020-06-27 ENCOUNTER — Ambulatory Visit (HOSPITAL_COMMUNITY)
Admission: RE | Admit: 2020-06-27 | Discharge: 2020-06-27 | Disposition: A | Discharge status: Home / Self Care | Payer: Medicare (Managed Care) | Source: Ambulatory Visit | Attending: Internal Medicine | Admitting: Internal Medicine

## 2020-06-27 DIAGNOSIS — G6189 Other inflammatory polyneuropathies: Secondary | ICD-10-CM | POA: Diagnosis present

## 2020-06-27 MED ORDER — IMMUNE GLOBULIN (HUMAN) 10 GM/100ML IV SOLN
70.0000 g | INTRAVENOUS | Status: DC
  Administered 2020-06-27: 70 g via INTRAVENOUS
  Filled 2020-06-27: qty 600

## 2020-06-27 MED ORDER — DIPHENHYDRAMINE HCL 25 MG PO CAPS
ORAL_CAPSULE | ORAL | Status: AC
  Administered 2020-06-27: 25 mg via ORAL
  Filled 2020-06-27: qty 1

## 2020-06-27 MED ORDER — DIPHENHYDRAMINE HCL 25 MG PO CAPS: 25.0000 mg | ORAL_CAPSULE | ORAL | Status: DC

## 2020-06-27 MED ORDER — ACETAMINOPHEN 325 MG PO TABS: 650.0000 mg | ORAL_TABLET | ORAL | Status: DC

## 2020-06-27 MED ORDER — ACETAMINOPHEN 325 MG PO TABS
ORAL_TABLET | ORAL | Status: AC
  Administered 2020-06-27: 650 mg via ORAL
  Filled 2020-06-27: qty 2

## 2020-07-11 ENCOUNTER — Other Ambulatory Visit: Payer: Self-pay | Admitting: Cardiovascular Disease

## 2020-07-15 ENCOUNTER — Other Ambulatory Visit: Payer: Self-pay | Admitting: Cardiovascular Disease

## 2020-08-02 ENCOUNTER — Telehealth: Payer: Self-pay

## 2020-08-02 NOTE — Telephone Encounter (Signed)
Appt scheduled 08/04/20.

## 2020-08-02 NOTE — Telephone Encounter (Signed)
Caller states she is experiencing dizziness and low blood pressure. She would like to schedule an appt to discuss medication change. Caller declined triage.  Telephone: [phone]

## 2020-08-02 NOTE — Telephone Encounter (Signed)
Pt needs a appointment please. 

## 2020-08-04 ENCOUNTER — Ambulatory Visit: Payer: Medicare (Managed Care) | Admitting: Family Medicine

## 2020-08-04 ENCOUNTER — Other Ambulatory Visit: Payer: Self-pay

## 2020-08-04 ENCOUNTER — Other Ambulatory Visit: Payer: Self-pay | Admitting: Cardiovascular Disease

## 2020-08-04 ENCOUNTER — Encounter: Payer: Self-pay | Admitting: Family Medicine

## 2020-08-04 VITALS — BP 96/60 | HR 70 | Temp 99.0°F | Resp 18 | Ht 67.5 in | Wt 139.8 lb

## 2020-08-04 DIAGNOSIS — R634 Abnormal weight loss: Secondary | ICD-10-CM | POA: Diagnosis not present

## 2020-08-04 DIAGNOSIS — I952 Hypotension due to drugs: Secondary | ICD-10-CM | POA: Insufficient documentation

## 2020-08-04 NOTE — Patient Instructions (Signed)
Hypotension °As your heart beats, it forces blood through your body. Hypotension, commonly called low blood pressure, is when the force of blood pumping through your arteries is too weak. Arteries are blood vessels that carry blood from the heart throughout the body. Depending on the cause and severity, hypotension may be harmless (benign) or may cause serious problems (be critical). °When blood pressure is too low, you may not get enough blood to your brain or to the rest of your organs. This can cause weakness, light-headedness, rapid heartbeat, and fainting. °What are the causes? °This condition may be caused by: °Blood loss. °Loss of body fluids (dehydration). °Heart problems. °Hormone (endocrine) problems. °Pregnancy. °Severe infection. °Lack of certain nutrients. °Severe allergic reactions (anaphylaxis). °Certain medicines, such as blood pressure medicine or medicines that make the body lose excess fluids (diuretics). Sometimes, hypotension may be caused by not taking medicine as directed, such as taking too much of a certain medicine. °What increases the risk? °The following factors may make you more likely to develop this condition: °Age. Risk increases as you get older. °Conditions that affect the heart or the central nervous system. °Taking certain medicines, such as blood pressure medicine or diuretics. °Being pregnant. °What are the signs or symptoms? °Common symptoms of this condition include: °Weakness. °Light-headedness. °Dizziness. °Blurred vision. °Fatigue. °Rapid heartbeat. °Fainting, in severe cases. °How is this diagnosed? °This condition is diagnosed based on: °Your medical history. °Your symptoms. °Your blood pressure measurement. Your health care provider will check your blood pressure when you are: °Lying down. °Sitting. °Standing. °A blood pressure reading is recorded as two numbers, such as "120 over 80" (or 120/80). The first ("top") number is called the systolic pressure. It is a measure  of the pressure in your arteries as your heart beats. The second ("bottom") number is called the diastolic pressure. It is a measure of the pressure in your arteries when your heart relaxes between beats. Blood pressure is measured in a unit called mm Hg. Healthy blood pressure for most adults is 120/80. If your blood pressure is below 90/60, you may be diagnosed with hypotension. °Other information or tests that may be used to diagnose hypotension include: °Your other vital signs, such as your heart rate and temperature. °Blood tests. °Tilt table test. For this test, you will be safely secured to a table that moves you from a lying position to an upright position. Your heart rhythm and blood pressure will be monitored during the test. °How is this treated? °Treatment for this condition may include: °Changing your diet. This may involve eating more salt (sodium) or drinking more water. °Taking medicines to raise your blood pressure. °Changing the dosage of certain medicines you are taking that might be lowering your blood pressure. °Wearing compression stockings. These stockings help to prevent blood clots and reduce swelling in your legs. °In some cases, you may need to go to the hospital for: °Fluid replacement. This means you will receive fluids through an IV. °Blood replacement. This means you will receive donated blood through an IV (transfusion). °Treating an infection or heart problems, if this applies. °Monitoring. You may need to be monitored while medicines that you are taking wear off. °Follow these instructions at home: °Eating and drinking ° °Drink enough fluid to keep your urine pale yellow. °Eat a healthy diet, and follow instructions from your health care provider about eating or drinking restrictions. A healthy diet includes: °Fresh fruits and vegetables. °Whole grains. °Lean meats. °Low-fat dairy products. °Eat extra salt   only as directed. Do not add extra salt to your diet unless your health care  provider told you to do that. °Eat frequent, small meals. °Avoid standing up suddenly after eating. °Medicines °Take over-the-counter and prescription medicines only as told by your health care provider. °Follow instructions from your health care provider about changing the dosage of your current medicines, if this applies. °Do not stop or adjust any of your medicines on your own. °General instructions ° °Wear compression stockings as told by your health care provider. °Get up slowly from lying down or sitting positions. This gives your blood pressure a chance to adjust. °Avoid hot showers and excessive heat as directed by your health care provider. °Return to your normal activities as told by your health care provider. Ask your health care provider what activities are safe for you. °Do not use any products that contain nicotine or tobacco, such as cigarettes, e-cigarettes, and chewing tobacco. If you need help quitting, ask your health care provider. °Keep all follow-up visits as told by your health care provider. This is important. °Contact a health care provider if you: °Vomit. °Have diarrhea. °Have a fever for more than 2-3 days. °Feel more thirsty than usual. °Feel weak and tired. °Get help right away if you: °Have chest pain. °Have a fast or irregular heartbeat. °Develop numbness in any part of your body. °Cannot move your arms or your legs. °Have trouble speaking. °Become sweaty or feel light-headed. °Faint. °Feel short of breath. °Have trouble staying awake. °Feel confused. °Summary °Hypotension is when the force of blood pumping through your arteries is too weak. °Hypotension may be harmless (benign) or may cause serious problems (be critical). °Treatment for this condition may include changing your diet, changing your medicines, and wearing compression stockings. °In some cases, you may need to go to the hospital for fluid or blood replacement. °This information is not intended to replace advice given to  you by your health care provider. Make sure you discuss any questions you have with your health care provider. °Document Revised: 07/10/2017 Document Reviewed: 07/10/2017 °Elsevier Patient Education © 2022 [employer] ° °

## 2020-08-04 NOTE — Assessment & Plan Note (Signed)
Stop losartan and f/u in 2-3 weeks or sooner prn  Pt also needs cardiology f/u

## 2020-08-04 NOTE — Assessment & Plan Note (Signed)
Pt states she is eating 3 meals a day + some She will keep track of what she eats and will f/u in 2-3 weeks

## 2020-08-04 NOTE — Progress Notes (Signed)
Patient ID: Samantha [name], female    DOB: 04/13/1960  Age: 60 y.o. MRN: [id number]    Subjective:  Subjective  HPI CHE BELOW presents for an office visit today. She complains of low BP and dizziness. She notes that her BP has been running low. She reports taking 50 mg losartan PO Daily and 25 mg spironolactone  PO Daily for her dx of HTN.  BP Readings from Last 3 Encounters:  08/04/20 96/60  06/27/20 (!) 133/93  05/03/20 134/84  She notes that she isn't trying to lose weight and has been eating regularly and as normal. Pt had lost 23 lbs since her last office visit. She endorses eating 3 meals per day, however she had reduced her pork intake. She states that her GERD is well managed with 20 mg pantoprazole PO Daily. Wt Readings from Last 3 Encounters:  08/04/20 139 lb 12.8 oz (63.4 kg)  06/27/20 162 lb (73.5 kg)  05/03/20 162 lb 0.6 oz (73.5 kg)  She denies any chest pain, SOB, fever, abdominal pain, cough, chills, sore throat, dysuria, urinary incontinence, back pain, HA, or N/V/D at this time. Pt is UTD with her rheumatologist.    Review of Systems  Constitutional:  Positive for unexpected weight change (weight lost). Negative for fatigue and fever.  HENT:  Negative for ear pain, rhinorrhea, sinus pressure, sinus pain, sore throat and tinnitus.   Eyes:  Negative for pain.  Respiratory:  Negative for cough, shortness of breath and wheezing.   Cardiovascular:  Negative for chest pain.       (+)) hypotension    Gastrointestinal:  Negative for abdominal pain, anal bleeding, constipation, diarrhea, nausea and vomiting.  Genitourinary:  Negative for flank pain.  Musculoskeletal:  Negative for back pain and neck pain.  Skin:  Negative for rash.  Neurological:  Positive for dizziness (Secondary to hypotension). Negative for seizures, weakness, light-headedness, numbness and headaches.   History Past Medical History:  Diagnosis Date   Altered mental status    Backache,  unspecified    Gastric ulcer, unspecified as acute or chronic, without mention of hemorrhage, perforation, or obstruction    Helicobacter pylori (H. pylori)    Hyperlipidemia    Lupus (HCC)    STEMI (ST elevation myocardial infarction) ([facility])    PCI to p/d RCA   Tobacco abuse    Tobacco use disorder 11/22/2011   Unspecified essential hypertension     She has a past surgical history that includes Upper gastrointestinal endoscopy (2013); Coronary/Graft Acute MI Revascularization (N/A, 05/30/2018); LEFT HEART CATH AND CORONARY ANGIOGRAPHY (N/A, 05/30/2018); Intraoprative transesophageal echocardiogram (N/A, 05/30/2018); Colonoscopy (10/12/2019); and Polypectomy.   Her family history includes Dementia in her mother; Diabetes in her sister; Hypertension in her mother.She reports that she has quit smoking. Her smoking use included cigarettes. She has a 30.00 pack-year smoking history. She has never used smokeless tobacco. She reports previous alcohol use. She reports that she does not use drugs.  Current Outpatient Medications on File Prior to Visit  Medication Sig Dispense Refill   acetaminophen (TYLENOL) 500 MG tablet Take 500 mg by mouth every 6 (six) hours as needed for moderate pain or headache.     aspirin EC 81 MG tablet Take 1 tablet (81 mg total) by mouth daily.     atorvastatin (LIPITOR) 80 MG tablet TAKE 1 TABLET (80 MG TOTAL) BY MOUTH DAILY AT 6 PM. 30 tablet 0   clopidogrel (PLAVIX) 75 MG tablet TAKE 1 TABLET BY MOUTH EVERY  DAY 30 tablet 0   cyclobenzaprine (FLEXERIL) 10 MG tablet TAKE 1 TABLET(10 MG) BY MOUTH THREE TIMES DAILY AS NEEDED (Patient taking differently: Take 10 mg by mouth 3 (three) times daily as needed for muscle spasms.) 30 tablet 0   hydroxychloroquine (PLAQUENIL) 200 MG tablet Take 300 mg by mouth daily.      loperamide (IMODIUM A-D) 2 MG tablet Take 1 tablet (2 mg total) by mouth 4 (four) times daily as needed for diarrhea or loose stools. 30 tablet 0   metoprolol  succinate (TOPROL-XL) 100 MG 24 hr tablet Take 1 tablet (100 mg total) by mouth daily. Immediately with or following a meal. 90 tablet 1   Multiple Vitamin (MULTIVITAMIN) tablet Take 1 tablet by mouth daily.     mycophenolate (CELLCEPT) 500 MG tablet Take 1,000 mg by mouth 2 (two) times daily.     nitroGLYCERIN (NITROSTAT) 0.4 MG SL tablet PLACE 1 TABLET (0.4 MG TOTAL) UNDER THE TONGUE EVERY 5 (FIVE) MINUTES AS NEEDED FOR CHEST PAIN. 25 tablet 0   spironolactone (ALDACTONE) 25 MG tablet TAKE 1 TABLET BY MOUTH EVERY DAY 90 tablet 3   No current facility-administered medications on file prior to visit.     Objective:  Objective  Physical Exam Vitals and nursing note reviewed.  Constitutional:      General: She is not in acute distress.    Appearance: Normal appearance. She is well-developed. She is not ill-appearing.  HENT:     Head: Normocephalic and atraumatic.     Right Ear: External ear normal.     Left Ear: External ear normal.     Nose: Nose normal.  Eyes:     General:        Right eye: No discharge.        Left eye: No discharge.     Extraocular Movements: Extraocular movements intact.     Pupils: Pupils are equal, round, and reactive to light.  Cardiovascular:     Rate and Rhythm: Normal rate and regular rhythm.     Pulses: Normal pulses.     Heart sounds: Normal heart sounds. No murmur heard.   No friction rub. No gallop.  Pulmonary:     Effort: Pulmonary effort is normal. No respiratory distress.     Breath sounds: Normal breath sounds. No stridor. No wheezing, rhonchi or rales.  Chest:     Chest wall: No tenderness.  Abdominal:     General: Bowel sounds are normal. There is no distension.     Palpations: Abdomen is soft. There is no mass.     Tenderness: There is no abdominal tenderness. There is no guarding or rebound.     Hernia: No hernia is present.  Musculoskeletal:        General: Normal range of motion.     Cervical back: Normal range of motion and neck  supple.     Right lower leg: No edema.     Left lower leg: No edema.  Skin:    General: Skin is warm and dry.  Neurological:     Mental Status: She is alert and oriented to person, place, and time.  Psychiatric:        Behavior: Behavior normal.        Thought Content: Thought content normal.   BP 96/60 (BP Location: Right Arm, Patient Position: Sitting, Cuff Size: Normal)   Pulse 70   Temp 99 F (37.2 C) (Oral)   Resp 18   Ht 5' 7.5" (  1.715 m)   Wt 139 lb 12.8 oz (63.4 kg)   LMP 04/11/2011   SpO2 97%   BMI 21.57 kg/m  Wt Readings from Last 3 Encounters:  08/04/20 139 lb 12.8 oz (63.4 kg)  06/27/20 162 lb (73.5 kg)  05/03/20 162 lb 0.6 oz (73.5 kg)     Lab Results  Component Value Date   WBC 4.5 08/11/2018   HGB 12.6 08/11/2018   HCT 39.6 08/11/2018   PLT 173.0 08/11/2018   GLUCOSE 89 09/15/2018   CHOL 152 12/17/2018   TRIG 83 12/17/2018   HDL 65 12/17/2018   LDLDIRECT 141.8 09/15/2012   LDLCALC 71 12/17/2018   ALT 28 09/15/2018   AST 30 09/15/2018   NA 135 09/15/2018   K 4.9 09/15/2018   CL 100 09/15/2018   CREATININE 0.97 09/15/2018   BUN 22 09/15/2018   CO2 21 09/15/2018   TSH 0.83 09/15/2012   INR 1.1 05/30/2018   HGBA1C 5.6 03/13/2011    No results found.   Assessment & Plan:  Plan   No orders of the defined types were placed in this encounter.   Problem List Items Addressed This Visit       Unprioritized   Hypotension due to drugs    Stop losartan and f/u in 2-3 weeks or sooner prn  Pt also needs cardiology f/u       Weight loss - Primary    Pt states she is eating 3 meals a day + some She will keep track of what she eats and will f/u in 2-3 weeks        Relevant Orders   Thyroid Panel With TSH    Follow-up: Return in about 3 weeks (around 08/25/2020), or if symptoms worsen or fail to improve, for hypertension.   I,Gordon Zheng,acting as a Education administrator for [employer], DO.,have documented all relevant documentation on the  behalf of [name], DO,as directed by  [name], DO while in the presence of [place], DO, have reviewed all documentation for this visit. The documentation on 08/04/20 for the exam, diagnosis, procedures, and orders are all accurate and complete.

## 2020-08-05 ENCOUNTER — Other Ambulatory Visit: Payer: Self-pay | Admitting: Cardiovascular Disease

## 2020-08-05 LAB — THYROID PANEL WITH TSH
Free Thyroxine Index: 2.2 (ref 1.4–3.8)
T3 Uptake: 37 % — ABNORMAL HIGH (ref 22–35)
T4, Total: 6 ug/dL (ref 5.1–11.9)
TSH: 0.78 mIU/L (ref 0.40–4.50)

## 2020-08-10 ENCOUNTER — Other Ambulatory Visit: Payer: Self-pay | Admitting: Cardiovascular Disease

## 2020-08-15 ENCOUNTER — Other Ambulatory Visit: Payer: Self-pay | Admitting: Cardiovascular Disease

## 2020-08-15 ENCOUNTER — Other Ambulatory Visit: Payer: Self-pay | Admitting: Family Medicine

## 2020-08-15 ENCOUNTER — Other Ambulatory Visit: Payer: Self-pay | Admitting: Physician Assistant

## 2020-08-15 DIAGNOSIS — I10 Essential (primary) hypertension: Secondary | ICD-10-CM

## 2020-08-22 ENCOUNTER — Ambulatory Visit (HOSPITAL_COMMUNITY)
Admission: RE | Admit: 2020-08-22 | Discharge: 2020-08-22 | Disposition: A | Discharge status: Home / Self Care | Payer: Medicare (Managed Care) | Source: Ambulatory Visit | Attending: Internal Medicine | Admitting: Internal Medicine

## 2020-08-22 ENCOUNTER — Other Ambulatory Visit: Payer: Self-pay

## 2020-08-22 DIAGNOSIS — M3210 Systemic lupus erythematosus, organ or system involvement unspecified: Secondary | ICD-10-CM | POA: Diagnosis present

## 2020-08-22 DIAGNOSIS — Z79899 Other long term (current) drug therapy: Secondary | ICD-10-CM | POA: Insufficient documentation

## 2020-08-22 DIAGNOSIS — G6189 Other inflammatory polyneuropathies: Secondary | ICD-10-CM | POA: Diagnosis not present

## 2020-08-22 MED ORDER — ACETAMINOPHEN 325 MG PO TABS: 650.0000 mg | ORAL_TABLET | ORAL | Status: DC

## 2020-08-22 MED ORDER — DIPHENHYDRAMINE HCL 25 MG PO CAPS
ORAL_CAPSULE | ORAL | Status: AC
  Administered 2020-08-22: 25 mg via ORAL
  Filled 2020-08-22: qty 1

## 2020-08-22 MED ORDER — IMMUNE GLOBULIN (HUMAN) 10 GM/100ML IV SOLN
70.0000 g | INTRAVENOUS | Status: DC
  Administered 2020-08-22: 70 g via INTRAVENOUS
  Filled 2020-08-22: qty 100

## 2020-08-22 MED ORDER — DIPHENHYDRAMINE HCL 25 MG PO CAPS: 25.0000 mg | ORAL_CAPSULE | ORAL | Status: DC

## 2020-08-22 MED ORDER — ACETAMINOPHEN 325 MG PO TABS
ORAL_TABLET | ORAL | Status: AC
  Administered 2020-08-22: 650 mg via ORAL
  Filled 2020-08-22: qty 2

## 2020-08-23 ENCOUNTER — Ambulatory Visit: Payer: Medicare Other | Admitting: *Deleted

## 2020-08-23 NOTE — Progress Notes (Signed)
Subjective:   MERSEDES STRADER is a 60 y.o. female who presents for Medicare Annual (Subsequent) preventive examination.   Review of Systems     Cardiac Risk Factors include: hypertension;dyslipidemia;sedentary lifestyle;smoking/ tobacco exposure     Objective:    Today's Vitals   08/24/20 0937  BP: 102/60  Pulse: 89  Resp: 16  Temp: (!) 97 F (36.1 C)  TempSrc: Temporal  SpO2: 98%  Weight: 142 lb 9.6 oz (64.7 kg)  Height: 5' 7.5" (1.715 m)   Body mass index is 22 kg/m.  Advanced Directives 08/24/2020 08/23/2019 08/18/2018 06/13/2018 05/30/2018 08/12/2017 12/25/2015  Does Patient Have a Medical Advance Directive? Yes Yes Yes No Yes Yes No  Type of Paramedic of [place];Living will [place];Living will [place] will [place];Living will -  Does patient want to make changes to medical advance directive? - No - Patient declined No - Patient declined - - - -  Copy of [place] in Chart? Yes - validated most recent copy scanned in chart (See row information) Yes - validated most recent copy scanned in chart (See row information) Yes - validated most recent copy scanned in chart (See row information) - - Yes -  Would patient like information on creating a medical advance directive? - - - No - Patient declined - - Yes (MAU/Ambulatory/Procedural Areas - Information given)  Pre-existing out of facility DNR order (yellow form or pink MOST form) - - - - - - -    Current Medications (verified) Outpatient Encounter Medications as of 08/24/2020  Medication Sig   acetaminophen (TYLENOL) 500 MG tablet Take 500 mg by mouth every 6 (six) hours as needed for moderate pain or headache.   aspirin EC 81 MG tablet Take 1 tablet (81 mg total) by mouth daily.   atorvastatin (LIPITOR) 80 MG tablet TAKE 1 TABLET BY MOUTH DAILY AT 6 PM.   clopidogrel (PLAVIX) 75 MG tablet TAKE 1 TABLET BY MOUTH  EVERY DAY   cyclobenzaprine (FLEXERIL) 10 MG tablet TAKE 1 TABLET(10 MG) BY MOUTH THREE TIMES DAILY AS NEEDED (Patient taking differently: Take 10 mg by mouth 3 (three) times daily as needed for muscle spasms.)   hydroxychloroquine (PLAQUENIL) 200 MG tablet Take 300 mg by mouth daily.    loperamide (IMODIUM A-D) 2 MG tablet Take 1 tablet (2 mg total) by mouth 4 (four) times daily as needed for diarrhea or loose stools.   metoprolol succinate (TOPROL-XL) 100 MG 24 hr tablet TAKE 1 TABLET (100 MG TOTAL) BY MOUTH DAILY. IMMEDIATELY WITH OR FOLLOWING A MEAL.   Multiple Vitamin (MULTIVITAMIN) tablet Take 1 tablet by mouth daily.   mycophenolate (CELLCEPT) 500 MG tablet Take 1,000 mg by mouth 2 (two) times daily.   nitroGLYCERIN (NITROSTAT) 0.4 MG SL tablet PLACE 1 TABLET (0.4 MG TOTAL) UNDER THE TONGUE EVERY 5 (FIVE) MINUTES AS NEEDED FOR CHEST PAIN.   pantoprazole (PROTONIX) 20 MG tablet TAKE 1 TABLET BY MOUTH EVERY DAY   spironolactone (ALDACTONE) 25 MG tablet TAKE 1 TABLET BY MOUTH EVERY DAY   No facility-administered encounter medications on file as of 08/24/2020.    Allergies (verified) Tramadol   History: Past Medical History:  Diagnosis Date   Altered mental status    Backache, unspecified    Gastric ulcer, unspecified as acute or chronic, without mention of hemorrhage, perforation, or obstruction    Helicobacter pylori (H. pylori)    Hyperlipidemia  Lupus ([facility])    STEMI (ST elevation myocardial infarction) ([facility])    PCI to p/d RCA   Tobacco abuse    Tobacco use disorder 11/22/2011   Unspecified essential hypertension    Past Surgical History:  Procedure Laterality Date   COLONOSCOPY  10/12/2019   2016   CORONARY/GRAFT ACUTE MI REVASCULARIZATION N/A 05/30/2018   Procedure: Coronary/Graft Acute MI Revascularization;  Surgeon: Burnell Blanks, MD;  Location: [facility];  Service: Cardiovascular;  Laterality: N/A;   INTRAOPERATIVE TRANSESOPHAGEAL ECHOCARDIOGRAM N/A  05/30/2018   Procedure: Transesophageal Echocardiogram;  Surgeon: Grace Isaac, MD;  Location: [facility];  Service: Vascular;  Laterality: N/A;   LEFT HEART CATH AND CORONARY ANGIOGRAPHY N/A 05/30/2018   Procedure: LEFT HEART CATH AND CORONARY ANGIOGRAPHY;  Surgeon: Burnell Blanks, MD;  Location: [facility];  Service: Cardiovascular;  Laterality: N/A;   POLYPECTOMY     UPPER GASTROINTESTINAL ENDOSCOPY  2013   Family History  Problem Relation Age of Onset   Diabetes Sister    Hypertension Mother    Dementia Mother    Colon cancer Neg Hx    Stomach cancer Neg Hx    Pancreatic cancer Neg Hx    Colon polyps Neg Hx    Rectal cancer Neg Hx    Social History   Socioeconomic History   Marital status: Single    Spouse name: Not on file   Number of children: Not on file   Years of education: Not on file   Highest education level: Not on file  Occupational History   Occupation: permanant disability    Employer: [employer]  Tobacco Use   Smoking status: Every Day    Packs/day: 0.25    Years: 40.00    Pack years: 10.00    Types: Cigarettes   Smokeless tobacco: Never  Vaping Use   Vaping Use: Never used  Substance and Sexual Activity   Alcohol use: Not Currently    Comment: 1 year since last drink    Drug use: No   Sexual activity: Not Currently    Partners: Male  Other Topics Concern   Not on file  Social History Narrative   Exercise--- walking   Social Determinants of Health   Financial Resource Strain: Low Risk    Difficulty of Paying Living Expenses: Not hard at all  Food Insecurity: No Food Insecurity   Worried About Charity fundraiser in the Last Year: Never true   Ran Out of Food in the Last Year: Never true  Transportation Needs: No Transportation Needs   Lack of Transportation (Medical): No   Lack of Transportation (Non-Medical): No  Physical Activity: Inactive   Days of Exercise per Week: 0 days   Minutes of Exercise per Session: 0 min   Stress: No Stress Concern Present   Feeling of Stress : Not at all  Social Connections: Socially Isolated   Frequency of Communication with Friends and Family: More than three times a week   Frequency of Social Gatherings with Friends and Family: Once a week   Attends Religious Services: Never   Marine scientist or Organizations: No   Attends Music therapist: Never   Marital Status: Never married    Tobacco Counseling Ready to quit: Not Answered Counseling given: Not Answered   Clinical Intake:  Pre-visit preparation completed: Yes  Pain : No/denies pain     Nutritional Status: BMI of 19-24  Normal Nutritional Risks: None Diabetes:  No  How often do you need to have someone help you when you read instructions, pamphlets, or other written materials from your doctor or pharmacy?: 1 - Never  Diabetic?No  Interpreter Needed?: No  Information entered by :: Caroleen Hamman LPN   Activities of Daily Living In your present state of health, do you have any difficulty performing the following activities: 08/24/2020  Hearing? N  Vision? N  Difficulty concentrating or making decisions? N  Walking or climbing stairs? N  Dressing or bathing? N  Doing errands, shopping? N  Preparing Food and eating ? N  Using the Toilet? N  In the past six months, have you accidently leaked urine? N  Do you have problems with loss of bowel control? N  Managing your Medications? N  Managing your Finances? N  Housekeeping or managing your Housekeeping? N  Some recent data might be hidden    Patient Care Team: Carollee Herter, Alferd Apa, DO as PCP - General (Family Medicine) Burnell Blanks, MD as PCP - Cardiology (Cardiology) Candee Furbish, MD as Referring Physician (Rheumatology) Inda Castle, MD (Inactive) as Consulting Physician (Gastroenterology) Melissa Noon, [place] as Referring Physician (Optometry)  Indicate any recent Medical Services you may have received  from other than [facility] providers in the past year (date may be approximate).     Assessment:   This is a routine wellness examination for [place].  Hearing/Vision screen Hearing Screening - Comments:: No issues Vision Screening - Comments:: Last eye exam-07/2020-[facility]  Dietary issues and exercise activities discussed: Current Exercise Habits: The patient does not participate in regular exercise at present, Exercise limited by: None identified   Goals Addressed             This Visit's Progress    Patient Stated       Maintain current health     Quit smoking / using tobacco   Not on track      Depression Screen PHQ 2/9 Scores 08/24/2020 08/23/2019 08/18/2018 08/12/2017 02/11/2017 01/26/2016 07/25/2014  PHQ - 2 Score 0 0 0 0 0 0 0    Fall Risk Fall Risk  08/24/2020 08/23/2019 08/18/2018 08/12/2017 07/25/2014  Falls in the past year? 0 0 0 No No  Number falls in past yr: 0 0 - - -  Injury with Fall? 0 0 - - -  Follow up Falls prevention discussed Education provided;Falls prevention discussed - - -    FALL RISK PREVENTION PERTAINING TO THE HOME:  Any stairs in or around the home? No  Home free of loose throw rugs in walkways, pet beds, electrical cords, etc? Yes  Adequate lighting in your home to reduce risk of falls? Yes   ASSISTIVE DEVICES UTILIZED TO PREVENT FALLS:  Life alert? No  Use of a cane, walker or w/c? No  Grab bars in the bathroom? Yes  Shower chair or bench in shower? No  Elevated toilet seat or a handicapped toilet? No   TIMED UP AND GO:  Was the test performed? Yes .  Length of time to ambulate 10 feet: 10 sec.   Gait steady and fast without use of assistive device  Cognitive Function: MMSE - Mini Mental State Exam 08/12/2017  Orientation to time 5  Orientation to Place 5  Registration 3  Attention/ Calculation 5  Recall 1  Language- name 2 objects 2  Language- repeat 1  Language- follow 3 step command 3  Language- read & follow direction 1   Write  a sentence 1  Copy design 1  Total score 28        Immunizations Immunization History  Administered Date(s) Administered   PFIZER(Purple Top)SARS-COV-2 Vaccination 05/17/2019, 06/07/2019   Tdap 09/15/2012    TDAP status: Up to date  Flu Vaccine status: Declined, Education has been provided regarding the importance of this vaccine but patient still declined. Advised may receive this vaccine at local pharmacy or Health Dept. Aware to provide a copy of the vaccination record if obtained from local pharmacy or Health Dept. Verbalized acceptance and understanding.  Pneumococcal vaccine status: Due, Education has been provided regarding the importance of this vaccine. Advised may receive this vaccine at local pharmacy or Health Dept. Aware to provide a copy of the vaccination record if obtained from local pharmacy or Health Dept. Verbalized acceptance and understanding.  Covid-19 vaccine status: Completed vaccines per patient  Qualifies for Shingles Vaccine? Yes   Zostavax completed No   Shingrix Completed?: No.    Education has been provided regarding the importance of this vaccine. Patient has been advised to call insurance company to determine out of pocket expense if they have not yet received this vaccine. Advised may also receive vaccine at local pharmacy or Health Dept. Verbalized acceptance and understanding.  Screening Tests Health Maintenance  Topic Date Due   Pneumococcal Vaccine 70-9 Years old (1 - PCV) Never done   Zoster Vaccines- Shingrix (1 of 2) Never done   PAP SMEAR-Modifier  12/25/2018   COVID-19 Vaccine (3 - Pfizer risk series) 07/05/2019   MAMMOGRAM  03/16/2021   TETANUS/TDAP  09/16/2022   COLONOSCOPY (Pts 45-39yr Insurance coverage will need to be confirmed)  10/12/2026   Hepatitis C Screening  Completed   HIV Screening  Completed   HPV VACCINES  Aged Out    Health Maintenance  Health Maintenance Due  Topic Date Due   Pneumococcal Vaccine 047659 Years old (1 - PCV) Never done   Zoster Vaccines- Shingrix (1 of 2) Never done   PAP SMEAR-Modifier  12/25/2018   COVID-19 Vaccine (3 - Pfizer risk series) 07/05/2019    Colorectal cancer screening: Type of screening: Colonoscopy. Completed 10/12/2019. Repeat every 7 years  Mammogram status: Ordered today. Pt provided with contact info and advised to call to schedule appt.   Bone Density status: Not yet indicated  Lung Cancer Screening: (Low Dose CT Chest recommended if Age 59-80years, 30 pack-year currently smoking OR have quit w/in 15years.) does not qualify.   Lung Cancer Screening : Completed 09/27/2019  Additional Screening:  Hepatitis C Screening:  Completed 02/27/2011  Vision Screening: Recommended annual ophthalmology exams for early detection of glaucoma and other disorders of the eye. Is the patient up to date with their annual eye exam?  Yes  Who is the provider or what is the name of the office in which the patient attends annual eye exams? [email or web address]   Dental Screening: Recommended annual dental exams for proper oral hygiene  Community Resource Referral / Chronic Care Management: CRR required this visit?  No   CCM required this visit?  No      Plan:     I have personally reviewed and noted the following in the patient's chart:   Medical and social history Use of alcohol, tobacco or illicit drugs  Current medications and supplements including opioid prescriptions.  Functional ability and status Nutritional status Physical activity Advanced directives List of other physicians Hospitalizations, surgeries, and ER visits in previous 12 months Vitals  Screenings to include cognitive, depression, and falls Referrals and appointments  In addition, I have reviewed and discussed with patient certain preventive protocols, quality metrics, and best practice recommendations. A written personalized care plan for preventive services as well as general preventive  health recommendations were provided to patient.   Patient to access avs on mychart  Marta Antu, [place]   [phone]  Nurse Health Advisor  Nurse Notes: None

## 2020-08-24 ENCOUNTER — Other Ambulatory Visit: Payer: Self-pay

## 2020-08-24 ENCOUNTER — Ambulatory Visit (INDEPENDENT_AMBULATORY_CARE_PROVIDER_SITE_OTHER): Payer: Medicare (Managed Care)

## 2020-08-24 VITALS — BP 102/60 | HR 89 | Temp 97.0°F | Resp 16 | Ht 67.5 in | Wt 142.6 lb

## 2020-08-24 DIAGNOSIS — Z1231 Encounter for screening mammogram for malignant neoplasm of breast: Secondary | ICD-10-CM | POA: Diagnosis not present

## 2020-08-24 DIAGNOSIS — Z0000 Encounter for general adult medical examination without abnormal findings: Secondary | ICD-10-CM | POA: Diagnosis not present

## 2020-08-24 NOTE — Patient Instructions (Signed)
Samantha [name] , Thank you for taking time to come for your Medicare Wellness Visit. I appreciate your ongoing commitment to your health goals. Please review the following plan we discussed and let me know if I can assist you in the future.   Screening recommendations/referrals: Colonoscopy: Completed 10/12/2019-Due 10/12/2026 Mammogram: Ordered today. Someone will call you to schedule. Bone Density: Not yet indicated. Due at age 60 Recommended yearly ophthalmology/optometry visit for glaucoma screening and checkup Recommended yearly dental visit for hygiene and checkup  Vaccinations: Influenza vaccine: Unable to take vaccine Pneumococcal vaccine: Discuss with PCP Tdap vaccine: Up to date-Due-09/16/2022 Shingles vaccine: Discuss with pharmacy   Covid-19:Up to date-Please bring date of booster to your next visit  Advanced directives: Copy in chart  Conditions/risks identified: See problem list   Next appointment: Follow up in one year for your annual wellness visit 08/30/2021 @ 9:00   Preventive Care 65 Years and Older, Female Preventive care refers to lifestyle choices and visits with your health care provider that can promote health and wellness. What does preventive care include? A yearly physical exam. This is also called an annual well check. Dental exams once or twice a year. Routine eye exams. Ask your health care provider how often you should have your eyes checked. Personal lifestyle choices, including: Daily care of your teeth and gums. Regular physical activity. Eating a healthy diet. Avoiding tobacco and drug use. Limiting alcohol use. Practicing safe sex. Taking low-dose aspirin every day. Taking vitamin and mineral supplements as recommended by your health care provider. What happens during an annual well check? The services and screenings done by your health care provider during your annual well check will depend on your age, overall health, lifestyle risk factors, and  family history of disease. Counseling  Your health care provider may ask you questions about your: Alcohol use. Tobacco use. Drug use. Emotional well-being. Home and relationship well-being. Sexual activity. Eating habits. History of falls. Memory and ability to understand (cognition). Work and work Statistician. Reproductive health. Screening  You may have the following tests or measurements: Height, weight, and BMI. Blood pressure. Lipid and cholesterol levels. These may be checked every 5 years, or more frequently if you are over 43 years old. Skin check. Lung cancer screening. You may have this screening every year starting at age 21 if you have a 30-pack-year history of smoking and currently smoke or have quit within the past 15 years. Fecal occult blood test (FOBT) of the stool. You may have this test every year starting at age 8. Flexible sigmoidoscopy or colonoscopy. You may have a sigmoidoscopy every 5 years or a colonoscopy every 10 years starting at age 21. Hepatitis C blood test. Hepatitis B blood test. Sexually transmitted disease (STD) testing. Diabetes screening. This is done by checking your blood sugar (glucose) after you have not eaten for a while (fasting). You may have this done every 1-3 years. Bone density scan. This is done to screen for osteoporosis. You may have this done starting at age 54. Mammogram. This may be done every 1-2 years. Talk to your health care provider about how often you should have regular mammograms. Talk with your health care provider about your test results, treatment options, and if necessary, the need for more tests. Vaccines  Your health care provider may recommend certain vaccines, such as: Influenza vaccine. This is recommended every year. Tetanus, diphtheria, and acellular pertussis (Tdap, Td) vaccine. You may need a Td booster every 10 years. Zoster vaccine. You  may need this after age 49. Pneumococcal 13-valent conjugate  (PCV13) vaccine. One dose is recommended after age 60. Pneumococcal polysaccharide (PPSV23) vaccine. One dose is recommended after age 74. Talk to your health care provider about which screenings and vaccines you need and how often you need them. This information is not intended to replace advice given to you by your health care provider. Make sure you discuss any questions you have with your health care provider. Document Released: 02/10/2015 Document Revised: 10/04/2015 Document Reviewed: 11/15/2014 Elsevier Interactive Patient Education  2017 [place] Prevention in the Home Falls can cause injuries. They can happen to people of all ages. There are many things you can do to make your home safe and to help prevent falls. What can I do on the outside of my home? Regularly fix the edges of walkways and driveways and fix any cracks. Remove anything that might make you trip as you walk through a door, such as a raised step or threshold. Trim any bushes or trees on the path to your home. Use bright outdoor lighting. Clear any walking paths of anything that might make someone trip, such as rocks or tools. Regularly check to see if handrails are loose or broken. Make sure that both sides of any steps have handrails. Any raised decks and porches should have guardrails on the edges. Have any leaves, snow, or ice cleared regularly. Use sand or salt on walking paths during winter. Clean up any spills in your garage right away. This includes oil or grease spills. What can I do in the bathroom? Use night lights. Install grab bars by the toilet and in the tub and shower. Do not use towel bars as grab bars. Use non-skid mats or decals in the tub or shower. If you need to sit down in the shower, use a plastic, non-slip stool. Keep the floor dry. Clean up any water that spills on the floor as soon as it happens. Remove soap buildup in the tub or shower regularly. Attach bath mats securely with  double-sided non-slip rug tape. Do not have throw rugs and other things on the floor that can make you trip. What can I do in the bedroom? Use night lights. Make sure that you have a light by your bed that is easy to reach. Do not use any sheets or blankets that are too big for your bed. They should not hang down onto the floor. Have a firm chair that has side arms. You can use this for support while you get dressed. Do not have throw rugs and other things on the floor that can make you trip. What can I do in the kitchen? Clean up any spills right away. Avoid walking on wet floors. Keep items that you use a lot in easy-to-reach places. If you need to reach something above you, use a strong step stool that has a grab bar. Keep electrical cords out of the way. Do not use floor polish or wax that makes floors slippery. If you must use wax, use non-skid floor wax. Do not have throw rugs and other things on the floor that can make you trip. What can I do with my stairs? Do not leave any items on the stairs. Make sure that there are handrails on both sides of the stairs and use them. Fix handrails that are broken or loose. Make sure that handrails are as long as the stairways. Check any carpeting to make sure that it is firmly  attached to the stairs. Fix any carpet that is loose or worn. Avoid having throw rugs at the top or bottom of the stairs. If you do have throw rugs, attach them to the floor with carpet tape. Make sure that you have a light switch at the top of the stairs and the bottom of the stairs. If you do not have them, ask someone to add them for you. What else can I do to help prevent falls? Wear shoes that: Do not have high heels. Have rubber bottoms. Are comfortable and fit you well. Are closed at the toe. Do not wear sandals. If you use a stepladder: Make sure that it is fully opened. Do not climb a closed stepladder. Make sure that both sides of the stepladder are locked  into place. Ask someone to hold it for you, if possible. Clearly mark and make sure that you can see: Any grab bars or handrails. First and last steps. Where the edge of each step is. Use tools that help you move around (mobility aids) if they are needed. These include: Canes. Walkers. Scooters. Crutches. Turn on the lights when you go into a dark area. Replace any light bulbs as soon as they burn out. Set up your furniture so you have a clear path. Avoid moving your furniture around. If any of your floors are uneven, fix them. If there are any pets around you, be aware of where they are. Review your medicines with your doctor. Some medicines can make you feel dizzy. This can increase your chance of falling. Ask your doctor what other things that you can do to help prevent falls. This information is not intended to replace advice given to you by your health care provider. Make sure you discuss any questions you have with your health care provider. Document Released: 11/10/2008 Document Revised: 06/22/2015 Document Reviewed: 02/18/2014 Elsevier Interactive Patient Education  2017 [employer].

## 2020-08-31 ENCOUNTER — Ambulatory Visit
Admission: RE | Admit: 2020-08-31 | Discharge: 2020-08-31 | Disposition: A | Discharge status: Home / Self Care | Payer: Medicare (Managed Care) | Source: Ambulatory Visit | Attending: Family Medicine | Admitting: Family Medicine

## 2020-08-31 ENCOUNTER — Other Ambulatory Visit: Payer: Self-pay

## 2020-08-31 DIAGNOSIS — Z1231 Encounter for screening mammogram for malignant neoplasm of breast: Secondary | ICD-10-CM

## 2020-09-08 ENCOUNTER — Other Ambulatory Visit: Payer: Self-pay | Admitting: Cardiovascular Disease

## 2020-09-19 ENCOUNTER — Other Ambulatory Visit: Payer: Self-pay | Admitting: Cardiovascular Disease

## 2020-09-28 ENCOUNTER — Other Ambulatory Visit: Payer: Self-pay | Admitting: Cardiovascular Disease

## 2020-10-14 ENCOUNTER — Other Ambulatory Visit: Payer: Self-pay | Admitting: Cardiovascular Disease

## 2020-10-17 ENCOUNTER — Encounter (HOSPITAL_COMMUNITY)
Admission: RE | Admit: 2020-10-17 | Discharge: 2020-10-17 | Disposition: A | Discharge status: Home / Self Care | Payer: Medicare (Managed Care) | Source: Ambulatory Visit | Attending: Internal Medicine | Admitting: Internal Medicine

## 2020-10-17 DIAGNOSIS — G609 Hereditary and idiopathic neuropathy, unspecified: Secondary | ICD-10-CM | POA: Insufficient documentation

## 2020-10-17 DIAGNOSIS — M329 Systemic lupus erythematosus, unspecified: Secondary | ICD-10-CM | POA: Insufficient documentation

## 2020-10-17 MED ORDER — IMMUNE GLOBULIN (HUMAN) 10 GM/100ML IV SOLN
70.0000 g | INTRAVENOUS | Status: DC
  Administered 2020-10-17: 70 g via INTRAVENOUS
  Filled 2020-10-17: qty 100

## 2020-10-17 MED ORDER — ACETAMINOPHEN 325 MG PO TABS
ORAL_TABLET | ORAL | Status: AC
  Filled 2020-10-17: qty 2

## 2020-10-17 MED ORDER — DIPHENHYDRAMINE HCL 25 MG PO CAPS
ORAL_CAPSULE | ORAL | Status: AC
  Filled 2020-10-17: qty 1

## 2020-10-17 MED ORDER — ACETAMINOPHEN 325 MG PO TABS
650.0000 mg | ORAL_TABLET | ORAL | Status: DC
  Administered 2020-10-17: 650 mg via ORAL

## 2020-10-17 MED ORDER — DIPHENHYDRAMINE HCL 25 MG PO CAPS
25.0000 mg | ORAL_CAPSULE | ORAL | Status: DC
  Administered 2020-10-17: 25 mg via ORAL

## 2020-10-28 ENCOUNTER — Other Ambulatory Visit: Payer: Self-pay | Admitting: Physician Assistant

## 2020-10-28 ENCOUNTER — Other Ambulatory Visit: Payer: Self-pay | Admitting: Cardiovascular Disease

## 2020-11-17 ENCOUNTER — Other Ambulatory Visit: Payer: Self-pay | Admitting: Family Medicine

## 2020-11-17 ENCOUNTER — Other Ambulatory Visit: Payer: Self-pay | Admitting: Cardiovascular Disease

## 2020-12-12 ENCOUNTER — Other Ambulatory Visit: Payer: Self-pay

## 2020-12-12 ENCOUNTER — Ambulatory Visit (HOSPITAL_COMMUNITY)
Admission: RE | Admit: 2020-12-12 | Discharge: 2020-12-12 | Disposition: A | Discharge status: Home / Self Care | Payer: Medicare (Managed Care) | Source: Ambulatory Visit | Attending: Internal Medicine | Admitting: Internal Medicine

## 2020-12-12 DIAGNOSIS — M329 Systemic lupus erythematosus, unspecified: Secondary | ICD-10-CM | POA: Insufficient documentation

## 2020-12-12 DIAGNOSIS — G6289 Other specified polyneuropathies: Secondary | ICD-10-CM | POA: Insufficient documentation

## 2020-12-12 DIAGNOSIS — G609 Hereditary and idiopathic neuropathy, unspecified: Secondary | ICD-10-CM | POA: Insufficient documentation

## 2020-12-12 MED ORDER — DIPHENHYDRAMINE HCL 25 MG PO CAPS
ORAL_CAPSULE | ORAL | Status: AC
  Filled 2020-12-12: qty 1

## 2020-12-12 MED ORDER — ACETAMINOPHEN 325 MG PO TABS
650.0000 mg | ORAL_TABLET | ORAL | Status: DC
Start: 2020-12-12 — End: 2020-12-13
  Administered 2020-12-12: 650 mg via ORAL

## 2020-12-12 MED ORDER — DIPHENHYDRAMINE HCL 25 MG PO CAPS
25.0000 mg | ORAL_CAPSULE | ORAL | Status: DC
  Administered 2020-12-12: 25 mg via ORAL

## 2020-12-12 MED ORDER — ACETAMINOPHEN 325 MG PO TABS
ORAL_TABLET | ORAL | Status: AC
  Filled 2020-12-12: qty 2

## 2020-12-12 MED ORDER — IMMUNE GLOBULIN (HUMAN) 10 GM/100ML IV SOLN
70.0000 g | INTRAVENOUS | Status: DC
  Administered 2020-12-12: 70 g via INTRAVENOUS
  Filled 2020-12-12: qty 400

## 2020-12-14 NOTE — Progress Notes (Signed)
Chief Complaint  Patient presents with   Follow-up    CAD   History of Present Illness: 60 yo female with history of lupus, CAD, HLD, HTN and tobacco abuse here today for cardiac follow up. She was admitted to [facility] May 2020 with an inferior STEMI treated with drug eluting stents in the proximal and distal RCA. Her initial presentation was initially worrisome for an aortic dissection based on her CTA but TEE did not show an aortic dissection. Chest CTA August 2020 with resolution of aortic fluid/soft tissue abnormality. No evidence of aortic dissection. Echo May 2020 with normal LV size and function.   She is here today for follow up. The patient denies any chest pain, dyspnea, palpitations, lower extremity edema, orthopnea, PND, dizziness, near syncope or syncope.    Primary Care Physician: Carollee Herter, Alferd Apa, DO  Past Medical History:  Diagnosis Date   Altered mental status    Backache, unspecified    Gastric ulcer, unspecified as acute or chronic, without mention of hemorrhage, perforation, or obstruction    Helicobacter pylori (H. pylori)    Hyperlipidemia    Lupus ([place])    STEMI (ST elevation myocardial infarction) ([place])    PCI to p/d RCA   Tobacco abuse    Tobacco use disorder 11/22/2011   Unspecified essential hypertension     Past Surgical History:  Procedure Laterality Date   COLONOSCOPY  10/12/2019   2016   CORONARY/GRAFT ACUTE MI REVASCULARIZATION N/A 05/30/2018   Procedure: Coronary/Graft Acute MI Revascularization;  Surgeon: Burnell Blanks, MD;  Location: [place] [facility];  Service: Cardiovascular;  Laterality: N/A;   INTRAOPERATIVE TRANSESOPHAGEAL ECHOCARDIOGRAM N/A 05/30/2018   Procedure: Transesophageal Echocardiogram;  Surgeon: Grace Isaac, MD;  Location: [place];  Service: Vascular;  Laterality: N/A;   LEFT HEART CATH AND CORONARY ANGIOGRAPHY N/A 05/30/2018   Procedure: LEFT HEART CATH AND CORONARY ANGIOGRAPHY;  Surgeon: Burnell Blanks,  MD;  Location: [facility];  Service: Cardiovascular;  Laterality: N/A;   POLYPECTOMY     UPPER GASTROINTESTINAL ENDOSCOPY  2013    Current Outpatient Medications  Medication Sig Dispense Refill   acetaminophen (TYLENOL) 500 MG tablet Take 500 mg by mouth every 6 (six) hours as needed for moderate pain or headache.     aspirin EC 81 MG tablet Take 1 tablet (81 mg total) by mouth daily.     atorvastatin (LIPITOR) 80 MG tablet TAKE 1 TABLET BY MOUTH DAILY AT 6 PM. 90 tablet 1   cyclobenzaprine (FLEXERIL) 10 MG tablet TAKE 1 TABLET(10 MG) BY MOUTH THREE TIMES DAILY AS NEEDED (Patient taking differently: Take 10 mg by mouth 3 (three) times daily as needed for muscle spasms.) 30 tablet 0   hydroxychloroquine (PLAQUENIL) 200 MG tablet Take 300 mg by mouth daily. Take 1 tab today and t tabs the next day (tab hard to break)     loperamide (IMODIUM A-D) 2 MG tablet Take 1 tablet (2 mg total) by mouth 4 (four) times daily as needed for diarrhea or loose stools. 30 tablet 0   metoprolol succinate (TOPROL-XL) 100 MG 24 hr tablet TAKE 1 TABLET (100 MG TOTAL) BY MOUTH DAILY. IMMEDIATELY WITH OR FOLLOWING A MEAL. 90 tablet 1   Multiple Vitamin (MULTIVITAMIN) tablet Take 1 tablet by mouth daily.     mycophenolate (CELLCEPT) 500 MG tablet Take 1,000 mg by mouth 2 (two) times daily.     nitroGLYCERIN (NITROSTAT) 0.4 MG SL tablet PLACE 1 TABLET (  0.4 MG TOTAL) UNDER THE TONGUE EVERY 5 (FIVE) MINUTES AS NEEDED FOR CHEST PAIN. 25 tablet 0   pantoprazole (PROTONIX) 20 MG tablet TAKE 1 TABLET BY MOUTH EVERY DAY 30 tablet 2   spironolactone (ALDACTONE) 25 MG tablet TAKE 1 TABLET BY MOUTH EVERY DAY 60 tablet 0   No current facility-administered medications for this visit.    Allergies  Allergen Reactions   Tramadol Nausea Only    Social History   Socioeconomic History   Marital status: Single    Spouse name: Not on file   Number of children: Not on file   Years of education: Not on file   Highest  education level: Not on file  Occupational History   Occupation: permanant disability    Employer: [employer]  Tobacco Use   Smoking status: Every Day    Packs/day: 0.25    Years: 40.00    Pack years: 10.00    Types: Cigarettes   Smokeless tobacco: Never  Vaping Use   Vaping Use: Never used  Substance and Sexual Activity   Alcohol use: Not Currently    Comment: 1 year since last drink    Drug use: No   Sexual activity: Not Currently    Partners: Male  Other Topics Concern   Not on file  Social History Narrative   Exercise--- walking   Social Determinants of Health   Financial Resource Strain: Low Risk    Difficulty of Paying Living Expenses: Not hard at all  Food Insecurity: No Food Insecurity   Worried About Charity fundraiser in the Last Year: Never true   Ran Out of Food in the Last Year: Never true  Transportation Needs: No Transportation Needs   Lack of Transportation (Medical): No   Lack of Transportation (Non-Medical): No  Physical Activity: Inactive   Days of Exercise per Week: 0 days   Minutes of Exercise per Session: 0 min  Stress: No Stress Concern Present   Feeling of Stress : Not at all  Social Connections: Socially Isolated   Frequency of Communication with Friends and Family: More than three times a week   Frequency of Social Gatherings with Friends and Family: Once a week   Attends Religious Services: Never   Marine scientist or Organizations: No   Attends Music therapist: Never   Marital Status: Never married  Human resources officer Violence: Not At Risk   Fear of Current or Ex-Partner: No   Emotionally Abused: No   Physically Abused: No   Sexually Abused: No    Family History  Problem Relation Age of Onset   Diabetes Sister    Hypertension Mother    Dementia Mother    Colon cancer Neg Hx    Stomach cancer Neg Hx    Pancreatic cancer Neg Hx    Colon polyps Neg Hx    Rectal cancer Neg Hx     Review of Systems:  As stated  in the HPI and otherwise negative.   BP 108/80   Pulse 78   Ht 5\' 8"  (1.727 m)   Wt 143 lb (64.9 kg)   LMP 04/11/2011   SpO2 97%   BMI 21.74 kg/m   Physical Examination: General: Well developed, well nourished, NAD  HEENT: OP clear, mucus membranes moist  SKIN: warm, dry. No rashes. Neuro: No focal deficits  Musculoskeletal: Muscle strength 5/5 all ext  Psychiatric: Mood and affect normal  Neck: No JVD, no carotid bruits, no thyromegaly, no  lymphadenopathy.  Lungs:Clear bilaterally, no wheezes, rhonci, crackles Cardiovascular: Regular rate and rhythm. No murmurs, gallops or rubs. Abdomen:Soft. Bowel sounds present. Non-tender.  Extremities: No lower extremity edema. Pulses are 2 + in the bilateral DP/PT.  Echo 05/31/18:  1. The left ventricle has low normal systolic function, with an ejection fraction of 50-55%. The cavity size was normal. Left ventricular diastolic Doppler parameters are consistent with impaired relaxation.  2. LVEF is approximately 50 to 55% with severe hypokinesis of the inferor/inferoseptal walls.  3. The right ventricle has mildly reduced systolic function. The cavity was mildly enlarged. There is no increase in right ventricular wall thickness.  4. The inferior vena cava was dilated in size with <50% respiratory variability.  5. The interatrial septum was not assessed.  EKG:  EKG is ordered today. The ekg ordered today demonstrates sinus  Recent Labs: 08/04/2020: TSH 0.78   Lipid Panel    Component Value Date/Time   CHOL 152 12/17/2018 0806   TRIG 83 12/17/2018 0806   HDL 65 12/17/2018 0806   CHOLHDL 2.3 12/17/2018 0806   CHOLHDL 2.6 05/30/2018 1107   VLDL 20 05/30/2018 1107   LDLCALC 71 12/17/2018 0806   LDLDIRECT 141.8 09/15/2012 1059     Wt Readings from Last 3 Encounters:  12/15/20 143 lb (64.9 kg)  12/12/20 142 lb (64.4 kg)  10/17/20 142 lb (64.4 kg)       Assessment and Plan:   1. CAD without angina: She has no chest pain. Will  continue ASA, beta blocker, statin and aldactone. Will stop Plavix.     2. HTN: BP is well controlled. No changes  3. HLD: LDL at goal in November 2020. Will continue statin.  Will check lipids and LFTs now.   Current medicines are reviewed at length with the patient today.  The patient does not have concerns regarding medicines.  The following changes have been made:  no change  Labs/ tests ordered today include:   Orders Placed This Encounter  Procedures   Lipid panel   Hepatic function panel   EKG 12-Lead    Disposition:   F/U with me in 12 months  Signed, Lauree Chandler, MD 12/15/2020 8:43 AM    [facility] [address], [place], [place]  [address] Phone: [phone]; Fax: [phone]

## 2020-12-15 ENCOUNTER — Ambulatory Visit: Payer: Medicare (Managed Care) | Admitting: Cardiovascular Disease

## 2020-12-15 ENCOUNTER — Other Ambulatory Visit: Payer: Self-pay

## 2020-12-15 ENCOUNTER — Encounter: Payer: Self-pay | Admitting: Cardiovascular Disease

## 2020-12-15 VITALS — BP 108/80 | HR 78 | Ht 68.0 in | Wt 143.0 lb

## 2020-12-15 DIAGNOSIS — E785 Hyperlipidemia, unspecified: Secondary | ICD-10-CM

## 2020-12-15 DIAGNOSIS — I2510 Atherosclerotic heart disease of native coronary artery without angina pectoris: Secondary | ICD-10-CM | POA: Diagnosis not present

## 2020-12-15 DIAGNOSIS — I10 Essential (primary) hypertension: Secondary | ICD-10-CM

## 2020-12-15 NOTE — Patient Instructions (Signed)
Medication Instructions:  Your physician has recommended you make the following change in your medication:  1.) Continue on metoprolol succinate (Toprol XL 100 mg) - one tablet daily 2.) stop clopidogrel (Plavix) 3.) stop metoprolol tartrate (Lopressor)  *If you need a refill on your cardiac medications before your next appointment, please call your pharmacy*   Lab Work: Your physician recommends that you return for labs (Lipids, Liver)  If you have labs (blood work) drawn today and your tests are completely normal, you will receive your results only by: [place] (if you have MyChart) OR A paper copy in the mail If you have any lab test that is abnormal or we need to change your treatment, we will call you to review the results.   Testing/Procedures: none   Follow-Up: At [facility], you and your health needs are our priority.  As part of our continuing mission to provide you with exceptional heart care, we have created designated Provider Care Teams.  These Care Teams include your primary Cardiologist (physician) and Advanced Practice Providers (APPs -  Physician Assistants and Nurse Practitioners) who all work together to provide you with the care you need, when you need it.  We recommend signing up for the patient portal called "MyChart".  Sign up information is provided on this After Visit Summary.  MyChart is used to connect with patients for Virtual Visits (Telemedicine).  Patients are able to view lab/test results, encounter notes, upcoming appointments, etc.  Non-urgent messages can be sent to your provider as well.   To learn more about what you can do with MyChart, go to [email or web address].    Your next appointment:   12 month(s)  The format for your next appointment:   In Person  Provider:   Lauree Chandler, MD     Other Instructions

## 2020-12-19 ENCOUNTER — Other Ambulatory Visit: Payer: Medicare (Managed Care) | Admitting: *Deleted

## 2020-12-19 ENCOUNTER — Other Ambulatory Visit: Payer: Self-pay

## 2020-12-19 DIAGNOSIS — E785 Hyperlipidemia, unspecified: Secondary | ICD-10-CM

## 2020-12-19 DIAGNOSIS — I10 Essential (primary) hypertension: Secondary | ICD-10-CM

## 2020-12-19 DIAGNOSIS — I2510 Atherosclerotic heart disease of native coronary artery without angina pectoris: Secondary | ICD-10-CM

## 2020-12-19 LAB — HEPATIC FUNCTION PANEL
ALT: 25 IU/L (ref 0–32)
AST: 28 IU/L (ref 0–40)
Albumin: 4.3 g/dL (ref 3.8–4.9)
Alkaline Phosphatase: 51 IU/L (ref 44–121)
Bilirubin Total: 0.5 mg/dL (ref 0.0–1.2)
Bilirubin, Direct: 0.17 mg/dL (ref 0.00–0.40)
Total Protein: 7.8 g/dL (ref 6.0–8.5)

## 2020-12-19 LAB — LIPID PANEL
Chol/HDL Ratio: 2.3 ratio (ref 0.0–4.4)
Cholesterol, Total: 147 mg/dL (ref 100–199)
HDL: 65 mg/dL (ref >39)
LDL Chol Calc (NIH): 64 mg/dL (ref 0–99)
Triglycerides: 97 mg/dL (ref 0–149)
VLDL Cholesterol Cal: 18 mg/dL (ref 5–40)

## 2021-01-19 ENCOUNTER — Other Ambulatory Visit: Payer: Self-pay | Admitting: Cardiovascular Disease

## 2021-02-06 ENCOUNTER — Other Ambulatory Visit: Payer: Self-pay

## 2021-02-06 ENCOUNTER — Ambulatory Visit (HOSPITAL_COMMUNITY)
Admission: RE | Admit: 2021-02-06 | Discharge: 2021-02-06 | Disposition: A | Discharge status: Home / Self Care | Payer: Medicare (Managed Care) | Source: Ambulatory Visit | Attending: Internal Medicine | Admitting: Internal Medicine

## 2021-02-06 DIAGNOSIS — G6289 Other specified polyneuropathies: Secondary | ICD-10-CM | POA: Insufficient documentation

## 2021-02-06 DIAGNOSIS — M329 Systemic lupus erythematosus, unspecified: Secondary | ICD-10-CM | POA: Diagnosis present

## 2021-02-06 MED ORDER — ACETAMINOPHEN 325 MG PO TABS
ORAL_TABLET | ORAL | Status: AC
  Administered 2021-02-06: 650 mg via ORAL
  Filled 2021-02-06: qty 2

## 2021-02-06 MED ORDER — ACETAMINOPHEN 325 MG PO TABS: 650.0000 mg | ORAL_TABLET | ORAL | Status: DC

## 2021-02-06 MED ORDER — DIPHENHYDRAMINE HCL 25 MG PO CAPS: 25.0000 mg | ORAL_CAPSULE | ORAL | Status: DC

## 2021-02-06 MED ORDER — DIPHENHYDRAMINE HCL 25 MG PO CAPS
ORAL_CAPSULE | ORAL | Status: AC
  Administered 2021-02-06: 25 mg via ORAL
  Filled 2021-02-06: qty 1

## 2021-02-06 MED ORDER — IMMUNE GLOBULIN (HUMAN) 10 GM/100ML IV SOLN
70.0000 g | INTRAVENOUS | Status: DC
  Administered 2021-02-06: 70 g via INTRAVENOUS
  Filled 2021-02-06: qty 200

## 2021-02-10 ENCOUNTER — Other Ambulatory Visit: Payer: Self-pay

## 2021-02-10 ENCOUNTER — Emergency Department (HOSPITAL_COMMUNITY)
Admission: EM | Admit: 2021-02-10 | Discharge: 2021-02-10 | Disposition: A | Discharge status: Home / Self Care | Payer: Medicare (Managed Care) | Attending: Emergency Medicine | Admitting: Emergency Medicine

## 2021-02-10 ENCOUNTER — Encounter (HOSPITAL_COMMUNITY): Payer: Self-pay | Admitting: Emergency Medicine

## 2021-02-10 ENCOUNTER — Ambulatory Visit (HOSPITAL_BASED_OUTPATIENT_CLINIC_OR_DEPARTMENT_OTHER)
Admission: RE | Admit: 2021-02-10 | Discharge: 2021-02-10 | Disposition: A | Discharge status: Home / Self Care | Payer: Medicare (Managed Care) | Source: Ambulatory Visit

## 2021-02-10 DIAGNOSIS — Z7982 Long term (current) use of aspirin: Secondary | ICD-10-CM | POA: Insufficient documentation

## 2021-02-10 DIAGNOSIS — M7989 Other specified soft tissue disorders: Secondary | ICD-10-CM | POA: Diagnosis present

## 2021-02-10 DIAGNOSIS — R252 Cramp and spasm: Secondary | ICD-10-CM | POA: Diagnosis not present

## 2021-02-10 DIAGNOSIS — M79661 Pain in right lower leg: Secondary | ICD-10-CM | POA: Insufficient documentation

## 2021-02-10 LAB — I-STAT CHEM 8, ED
BUN: 20 mg/dL (ref 6–20)
Calcium, Ion: 1.2 mmol/L (ref 1.15–1.40)
Chloride: 102 mmol/L (ref 98–111)
Creatinine, Ser: 1 mg/dL (ref 0.44–1.00)
Glucose, Bld: 79 mg/dL (ref 70–99)
HCT: 41 % (ref 36.0–46.0)
Hemoglobin: 13.9 g/dL (ref 12.0–15.0)
Potassium: 3.5 mmol/L (ref 3.5–5.1)
Sodium: 135 mmol/L (ref 135–145)
TCO2: 23 mmol/L (ref 22–32)

## 2021-02-10 MED ORDER — ENOXAPARIN SODIUM 80 MG/0.8ML IJ SOSY
70.0000 mg | PREFILLED_SYRINGE | Freq: Once | INTRAMUSCULAR | Status: AC
  Administered 2021-02-10: 70 mg via SUBCUTANEOUS
  Filled 2021-02-10: qty 0.7

## 2021-02-10 NOTE — Discharge Instructions (Signed)
It is important that you follow the instructions below about returning for an ultrasound of your leg to look for DVT. You were treated for possible DVT tonight with a blood thinning medication shot. If your ultrasound is positive for DVT, you will be directed back to the ER for further treatment.  If it is negative, you will need to follow-up with your primary care doctor for further evaluation of your leg swelling. Return to the emergency room immediately if you develop chest pain, difficulty breathing, numbness/color change of your foot, or any new, worsening, or concerning symptoms

## 2021-02-10 NOTE — ED Provider Notes (Signed)
[facility] EMERGENCY DEPARTMENT Provider Note   CSN: [id number] Arrival date & time: 02/10/21  0115     History  Chief Complaint  Patient presents with   Calf pain/sweelling    Samantha [name] is a 61 y.o. female presenting for evaluation of right leg swelling and hardness.    Patient states tonight the first time she has noticed swelling of her right leg.  She denies pain, but states it feels hard.  No trauma or injury.  No history of similar.  She is not on a blood thinner.  History of lupus.  She denies chest pain or shortness breath.  No fevers, nausea, vomiting.  HPI     Home Medications Prior to Admission medications   Medication Sig Start Date End Date Taking? Authorizing Provider  acetaminophen (TYLENOL) 500 MG tablet Take 500 mg by mouth every 6 (six) hours as needed for moderate pain or headache.    [provider]  aspirin EC 81 MG tablet Take 1 tablet (81 mg total) by mouth daily. 08/05/16   Roma Schanz R, DO  atorvastatin (LIPITOR) 80 MG tablet TAKE 1 TABLET BY MOUTH DAILY AT 6 PM. 08/15/20   Burnell Blanks, MD  cyclobenzaprine (FLEXERIL) 10 MG tablet TAKE 1 TABLET(10 MG) BY MOUTH THREE TIMES DAILY AS NEEDED Patient taking differently: Take 10 mg by mouth 3 (three) times daily as needed for muscle spasms. 06/11/18   [name], DO  hydroxychloroquine (PLAQUENIL) 200 MG tablet Take 300 mg by mouth daily. Take 1 tab today and t tabs the next day (tab hard to break)    [provider]  loperamide (IMODIUM A-D) 2 MG tablet Take 1 tablet (2 mg total) by mouth 4 (four) times daily as needed for diarrhea or loose stools. 06/16/18   Manuella Ghazi, Pratik D, DO  metoprolol succinate (TOPROL-XL) 100 MG 24 hr tablet TAKE 1 TABLET (100 MG TOTAL) BY MOUTH DAILY. IMMEDIATELY WITH OR FOLLOWING A MEAL. 08/15/20   Carollee Herter, Alferd Apa, DO  Multiple Vitamin (MULTIVITAMIN) tablet Take 1 tablet by mouth daily.    [provider]   mycophenolate (CELLCEPT) 500 MG tablet Take 1,000 mg by mouth 2 (two) times daily. 09/24/19   [provider]  nitroGLYCERIN (NITROSTAT) 0.4 MG SL tablet PLACE 1 TABLET (0.4 MG TOTAL) UNDER THE TONGUE EVERY 5 (FIVE) MINUTES AS NEEDED FOR CHEST PAIN. 04/17/20   Burnell Blanks, MD  pantoprazole (PROTONIX) 20 MG tablet TAKE 1 TABLET BY MOUTH EVERY DAY 11/17/20   Carollee Herter, Kendrick Fries R, DO  spironolactone (ALDACTONE) 25 MG tablet TAKE 1 TABLET BY MOUTH EVERY DAY 01/19/21   Burnell Blanks, MD      Allergies    Tramadol    Review of Systems   Review of Systems  Cardiovascular:  Positive for leg swelling.   Physical Exam Updated Vital Signs BP (!) 134/97    Pulse (!) 103    Temp 98.6 F (37 C) (Oral)    Resp 18    Ht 5\' 8"  (1.727 m)    Wt 71 kg    LMP 04/11/2011    SpO2 97%    BMI 23.80 kg/m  Physical Exam Vitals and nursing note reviewed.  Constitutional:      General: She is not in acute distress.    Appearance: Normal appearance. She is well-developed.     Comments: Resting in the bed in NAD  HENT:     Head:  Normocephalic and atraumatic.  Eyes:     Extraocular Movements: Extraocular movements intact.     Conjunctiva/sclera: Conjunctivae normal.     Pupils: Pupils are equal, round, and reactive to light.  Cardiovascular:     Rate and Rhythm: Normal rate and regular rhythm.     Pulses: Normal pulses.     Comments: R around 100 on my exam Pulmonary:     Effort: Pulmonary effort is normal. No respiratory distress.     Breath sounds: Normal breath sounds. No wheezing.     Comments: Speaking in full sentences.  Clear lung sounds in all fields. Abdominal:     General: There is no distension.     Palpations: Abdomen is soft. There is no mass.     Tenderness: There is no abdominal tenderness. There is no guarding or rebound.  Musculoskeletal:        General: Swelling present.     Cervical back: Normal range of motion and neck supple.     Comments: Unilateral  swelling of the right lower leg.  Positive Homans with tenderness palpation of the calf.  No erythema or warmth of the skin.  No induration.  Good pedal pulse.  Skin:    General: Skin is warm and dry.     Capillary Refill: Capillary refill takes less than 2 seconds.     Findings: No rash.  Neurological:     Mental Status: She is alert and oriented to person, place, and time.  Psychiatric:        Mood and Affect: Mood and affect normal.        Speech: Speech normal.        Behavior: Behavior normal.    ED Results / Procedures / Treatments   Labs (all labs ordered are listed, but only abnormal results are displayed) Labs Reviewed  I-STAT CHEM 8, ED    EKG None  Radiology No results found.  Procedures Procedures    Medications Ordered in ED Medications - No data to display  ED Course/ Medical Decision Making/ A&P                           Medical Decision Making   This patient presents to the ED for concern of R leg swelling.This involves a number of treatment options, and is a complaint that carries with it a moderate risk of complications and morbidity.  The differential diagnosis includes DVT, infection, arterial occlusion, CHF, MSK pain or swelling.   Co morbidities:  lupus   Additional history: Reviewed medical history.  [facility] notes for management of her lupus.  Lab Tests:  I ordered, and personally interpreted labs.  The pertinent results include:  normal hgb. SCr at baseline.    Imaging Studies:  Pt will need a [place] for evaluation of DVT. Unfortunately this is not available at night, OP [place] ordered for tomorrow.   Medicines ordered:  I ordered medication including Lovenox  for presumed DVT  Test Considered:  As patient has no chest pain or shortness of breath, I do not feel she needs emergent CTA.  Disposition:  After consideration of the diagnostic results and the patients response to treatment, I feel that the patent would  benefit from returning tomorrow for an outpatient DVT ultrasound study.  If negative, patient to follow-up with her PCP for further evaluation of her swelling.  At this time, patient appears safe for discharge.  Return precautions given.  Patient states she understands and agrees to plan   Final Clinical Impression(s) / ED Diagnoses Final diagnoses:  Right leg swelling    Rx / DC Orders ED Discharge Orders          Ordered    LE VENOUS        02/10/21 0202              Franchot Heidelberg, PA-C 02/10/21 0207    Maudie Flakes, MD 02/10/21 [phone]

## 2021-02-10 NOTE — ED Triage Notes (Signed)
Patient reports right calf pain with swelling onset this evening , no injury/denies SOB .

## 2021-02-10 NOTE — Progress Notes (Signed)
VASCULAR LAB    Right lower extremity venous duplex has been performed.  See CV proc for preliminary results.   Jasun Gasparini, RVT 02/10/2021, 11:29 AM

## 2021-02-21 ENCOUNTER — Other Ambulatory Visit: Payer: Self-pay | Admitting: Family Medicine

## 2021-02-27 ENCOUNTER — Ambulatory Visit: Payer: Medicare (Managed Care) | Admitting: Family Medicine

## 2021-02-27 ENCOUNTER — Encounter: Payer: Self-pay | Admitting: Family Medicine

## 2021-02-27 VITALS — BP 110/72 | HR 83 | Temp 97.8°F | Resp 18 | Ht 68.0 in | Wt 140.6 lb

## 2021-02-27 DIAGNOSIS — G6189 Other inflammatory polyneuropathies: Secondary | ICD-10-CM | POA: Diagnosis not present

## 2021-02-27 DIAGNOSIS — E785 Hyperlipidemia, unspecified: Secondary | ICD-10-CM

## 2021-02-27 DIAGNOSIS — I10 Essential (primary) hypertension: Secondary | ICD-10-CM

## 2021-02-27 DIAGNOSIS — R570 Cardiogenic shock: Secondary | ICD-10-CM

## 2021-02-27 DIAGNOSIS — M329 Systemic lupus erythematosus, unspecified: Secondary | ICD-10-CM

## 2021-02-27 DIAGNOSIS — R600 Localized edema: Secondary | ICD-10-CM | POA: Insufficient documentation

## 2021-02-27 NOTE — Assessment & Plan Note (Signed)
Per rheum 

## 2021-02-27 NOTE — Patient Instructions (Signed)
Edema °Edema is an abnormal buildup of fluids in the body tissues and under the skin. Swelling of the legs, feet, and ankles is a common symptom that becomes more likely as you get older. Swelling is also common in looser tissues, like around the eyes. When the affected area is squeezed, the fluid may move out of that spot and leave a dent for a few moments. This dent is called pitting edema. °There are many possible causes of edema. Eating too much salt (sodium) and being on your feet or sitting for a long time can cause edema in your legs, feet, and ankles. Hot weather may make edema worse. Common causes of edema include: °Heart failure. °Liver or kidney disease. °Weak leg blood vessels. °Cancer. °An injury. °Pregnancy. °Medicines. °Being obese. °Low protein levels in the blood. °Edema is usually painless. Your skin may look swollen or shiny. °Follow these instructions at home: °Keep the affected body part raised (elevated) above the level of your heart when you are sitting or lying down. °Do not sit still or stand for long periods of time. °Do not wear tight clothing. Do not wear garters on your upper legs. °Exercise your legs to get your circulation going. This helps to move the fluid back into your blood vessels, and it may help the swelling go down. °Wear elastic bandages or support stockings to reduce swelling as told by your health care provider. °Eat a low-salt (low-sodium) diet to reduce fluid as told by your health care provider. °Depending on the cause of your swelling, you may need to limit how much fluid you drink (fluid restriction). °Take over-the-counter and prescription medicines only as told by your health care provider. °Contact a health care provider if: °Your edema does not get better with treatment. °You have heart, liver, or kidney disease and have symptoms of edema. °You have sudden and unexplained weight gain. °Get help right away if: °You develop shortness of breath or chest pain. °You  cannot breathe when you lie down. °You develop pain, redness, or warmth in the swollen areas. °You have heart, liver, or kidney disease and suddenly get edema. °You have a fever and your symptoms suddenly get worse. °Summary °Edema is an abnormal buildup of fluids in the body tissues and under the skin. °Eating too much salt (sodium) and being on your feet or sitting for a long time can cause edema in your legs, feet, and ankles. °Keep the affected body part raised (elevated) above the level of your heart when you are sitting or lying down. °This information is not intended to replace advice given to you by your health care provider. Make sure you discuss any questions you have with your health care provider. °Document Revised: 06/15/2020 Document Reviewed: 11/09/2019 °Elsevier Patient Education © 2022 Elsevier Inc. ° °

## 2021-02-27 NOTE — Progress Notes (Signed)
Established Patient Office Visit  Subjective:  Patient ID: Samantha [name], female    DOB: Dec 09, 1960  Age: 61 y.o. MRN: [id number]  CC:  Chief Complaint  Patient presents with   Leg Swelling    Pt seen in ED on 01/31/21 for right calf swelling, Pt states calf is showing some improvement. Pt states no pain. Imaging done at ER and no clot.    Follow-up    HPI Samantha [name] presents for f/u ed for R low ext edema.   DVT was ruled out.  Swelling is gone now   pt never had any pain.    Past Medical History:  Diagnosis Date   Altered mental status    Backache, unspecified    Gastric ulcer, unspecified as acute or chronic, without mention of hemorrhage, perforation, or obstruction    Helicobacter pylori (H. pylori)    Hyperlipidemia    Lupus (HCC)    STEMI (ST elevation myocardial infarction) ([place])    PCI to p/d RCA   Tobacco abuse    Tobacco use disorder 11/22/2011   Unspecified essential hypertension     Past Surgical History:  Procedure Laterality Date   COLONOSCOPY  10/12/2019   2016   CORONARY/GRAFT ACUTE MI REVASCULARIZATION N/A 05/30/2018   Procedure: Coronary/Graft Acute MI Revascularization;  Surgeon: Burnell Blanks, MD;  Location: [facility];  Service: Cardiovascular;  Laterality: N/A;   INTRAOPERATIVE TRANSESOPHAGEAL ECHOCARDIOGRAM N/A 05/30/2018   Procedure: Transesophageal Echocardiogram;  Surgeon: Grace Isaac, MD;  Location: [facility];  Service: Vascular;  Laterality: N/A;   LEFT HEART CATH AND CORONARY ANGIOGRAPHY N/A 05/30/2018   Procedure: LEFT HEART CATH AND CORONARY ANGIOGRAPHY;  Surgeon: Burnell Blanks, MD;  Location: [facility];  Service: Cardiovascular;  Laterality: N/A;   POLYPECTOMY     UPPER GASTROINTESTINAL ENDOSCOPY  2013    Family History  Problem Relation Age of Onset   Diabetes Sister    Hypertension Mother    Dementia Mother    Colon cancer Neg Hx    Stomach cancer Neg Hx    Pancreatic cancer Neg Hx     Colon polyps Neg Hx    Rectal cancer Neg Hx     Social History   Socioeconomic History   Marital status: Single    Spouse name: Not on file   Number of children: Not on file   Years of education: Not on file   Highest education level: Not on file  Occupational History   Occupation: permanant disability    Employer: [employer]  Tobacco Use   Smoking status: Every Day    Packs/day: 0.25    Years: 40.00    Pack years: 10.00    Types: Cigarettes   Smokeless tobacco: Never  Vaping Use   Vaping Use: Never used  Substance and Sexual Activity   Alcohol use: Not Currently    Comment: 1 year since last drink    Drug use: No   Sexual activity: Not Currently    Partners: Male  Other Topics Concern   Not on file  Social History Narrative   Exercise--- walking   Social Determinants of Health   Financial Resource Strain: Low Risk    Difficulty of Paying Living Expenses: Not hard at all  Food Insecurity: No Food Insecurity   Worried About Charity fundraiser in the Last Year: Never true   Ran Out of Food in the Last Year: Never true  Transportation Needs:  No Transportation Needs   Lack of Transportation (Medical): No   Lack of Transportation (Non-Medical): No  Physical Activity: Inactive   Days of Exercise per Week: 0 days   Minutes of Exercise per Session: 0 min  Stress: No Stress Concern Present   Feeling of Stress : Not at all  Social Connections: Socially Isolated   Frequency of Communication with Friends and Family: More than three times a week   Frequency of Social Gatherings with Friends and Family: Once a week   Attends Religious Services: Never   Marine scientist or Organizations: No   Attends Music therapist: Never   Marital Status: Never married  Human resources officer Violence: Not At Risk   Fear of Current or Ex-Partner: No   Emotionally Abused: No   Physically Abused: No   Sexually Abused: No    Outpatient Medications Prior to Visit   Medication Sig Dispense Refill   acetaminophen (TYLENOL) 500 MG tablet Take 500 mg by mouth every 6 (six) hours as needed for moderate pain or headache.     aspirin EC 81 MG tablet Take 1 tablet (81 mg total) by mouth daily.     atorvastatin (LIPITOR) 80 MG tablet TAKE 1 TABLET BY MOUTH DAILY AT 6 PM. 90 tablet 1   cyclobenzaprine (FLEXERIL) 10 MG tablet TAKE 1 TABLET(10 MG) BY MOUTH THREE TIMES DAILY AS NEEDED (Patient taking differently: Take 10 mg by mouth 3 (three) times daily as needed for muscle spasms.) 30 tablet 0   hydroxychloroquine (PLAQUENIL) 200 MG tablet Take 300 mg by mouth daily. Take 1 tab today and t tabs the next day (tab hard to break)     loperamide (IMODIUM A-D) 2 MG tablet Take 1 tablet (2 mg total) by mouth 4 (four) times daily as needed for diarrhea or loose stools. 30 tablet 0   metoprolol succinate (TOPROL-XL) 100 MG 24 hr tablet TAKE 1 TABLET (100 MG TOTAL) BY MOUTH DAILY. IMMEDIATELY WITH OR FOLLOWING A MEAL. 90 tablet 1   Multiple Vitamin (MULTIVITAMIN) tablet Take 1 tablet by mouth daily.     mycophenolate (CELLCEPT) 500 MG tablet Take 1,000 mg by mouth 2 (two) times daily.     nitroGLYCERIN (NITROSTAT) 0.4 MG SL tablet PLACE 1 TABLET (0.4 MG TOTAL) UNDER THE TONGUE EVERY 5 (FIVE) MINUTES AS NEEDED FOR CHEST PAIN. 25 tablet 0   pantoprazole (PROTONIX) 20 MG tablet TAKE 1 TABLET BY MOUTH EVERY DAY 30 tablet 0   spironolactone (ALDACTONE) 25 MG tablet TAKE 1 TABLET BY MOUTH EVERY DAY 90 tablet 3   No facility-administered medications prior to visit.    Allergies  Allergen Reactions   Tramadol Nausea Only    ROS Review of Systems  Constitutional:  Negative for appetite change, diaphoresis, fatigue and unexpected weight change.  Eyes:  Negative for pain, redness and visual disturbance.  Respiratory:  Negative for cough, chest tightness, shortness of breath and wheezing.   Cardiovascular:  Positive for leg swelling. Negative for chest pain and palpitations.   Endocrine: Negative for cold intolerance, heat intolerance, polydipsia, polyphagia and polyuria.  Genitourinary:  Negative for difficulty urinating, dysuria and frequency.  Neurological:  Negative for dizziness, light-headedness, numbness and headaches.     Objective:    Physical Exam Vitals and nursing note reviewed.  Constitutional:      Appearance: She is well-developed.  HENT:     Head: Normocephalic and atraumatic.  Eyes:     Conjunctiva/sclera: Conjunctivae normal.  Neck:  Thyroid: No thyromegaly.     Vascular: No carotid bruit or JVD.  Cardiovascular:     Rate and Rhythm: Normal rate and regular rhythm.     Heart sounds: Normal heart sounds. No murmur heard. Pulmonary:     Effort: Pulmonary effort is normal. No respiratory distress.     Breath sounds: Normal breath sounds. No wheezing or rales.  Chest:     Chest wall: No tenderness.  Musculoskeletal:     Cervical back: Normal range of motion and neck supple.     Right lower leg: 1+ Pitting Edema present.     Left lower leg: No swelling. No edema.  Neurological:     Mental Status: She is alert and oriented to person, place, and time.  Psychiatric:        Mood and Affect: Mood normal.    BP 110/72 (BP Location: Left Arm, Patient Position: Sitting, Cuff Size: Normal)    Pulse 83    Temp 97.8 F (36.6 C) (Oral)    Resp 18    Ht 5\' 8"  (1.727 m)    Wt 140 lb 9.6 oz (63.8 kg)    LMP 04/11/2011    SpO2 96%    BMI 21.38 kg/m  Wt Readings from Last 3 Encounters:  02/27/21 140 lb 9.6 oz (63.8 kg)  02/10/21 156 lb 8.4 oz (71 kg)  02/06/21 142 lb (64.4 kg)     Health Maintenance Due  Topic Date Due   Zoster Vaccines- Shingrix (1 of 2) Never done   PAP SMEAR-Modifier  12/25/2018   COVID-19 Vaccine (3 - Pfizer risk series) 07/05/2019    There are no preventive care reminders to display for this patient.  Lab Results  Component Value Date   TSH 0.78 08/04/2020   Lab Results  Component Value Date   WBC 4.5  08/11/2018   HGB 13.9 02/10/2021   HCT 41.0 02/10/2021   MCV 87.5 08/11/2018   PLT 173.0 08/11/2018   Lab Results  Component Value Date   NA 135 02/10/2021   K 3.5 02/10/2021   CO2 21 09/15/2018   GLUCOSE 79 02/10/2021   BUN 20 02/10/2021   CREATININE 1.00 02/10/2021   BILITOT 0.5 12/19/2020   ALKPHOS 51 12/19/2020   AST 28 12/19/2020   ALT 25 12/19/2020   PROT 7.8 12/19/2020   ALBUMIN 4.3 12/19/2020   CALCIUM 9.4 09/15/2018   ANIONGAP 11 06/16/2018   GFR 90.87 02/17/2018   Lab Results  Component Value Date   CHOL 147 12/19/2020   Lab Results  Component Value Date   HDL 65 12/19/2020   Lab Results  Component Value Date   LDLCALC 64 12/19/2020   Lab Results  Component Value Date   TRIG 97 12/19/2020   Lab Results  Component Value Date   CHOLHDL 2.3 12/19/2020   Lab Results  Component Value Date   HGBA1C 5.6 03/13/2011      Assessment & Plan:   Problem List Items Addressed This Visit       Unprioritized   Cardiogenic shock ([place])   Disseminated lupus erythematosus ([place])    Per rheum       HTN (hypertension)    Well controlled, no changes to meds. Encouraged heart healthy diet such as the DASH diet and exercise as tolerated.       Hyperlipidemia LDL goal <70    Encourage heart healthy diet such as MIND or DASH diet, increase exercise, avoid trans fats, simple carbohydrates and processed  foods, consider a krill or fish or flaxseed oil cap daily.       Lower extremity edema - Primary    Elevate legs con't lasix Compression socks       Other inflammatory polyneuropathies (HCC)    No orders of the defined types were placed in this encounter.   Follow-up: Return if symptoms worsen or fail to improve.    [name], DO

## 2021-02-27 NOTE — Assessment & Plan Note (Signed)
Encourage heart healthy diet such as MIND or DASH diet, increase exercise, avoid trans fats, simple carbohydrates and processed foods, consider a krill or fish or flaxseed oil cap daily.  °

## 2021-02-27 NOTE — Assessment & Plan Note (Signed)
Elevate legs con't lasix Compression socks

## 2021-02-27 NOTE — Assessment & Plan Note (Signed)
Well controlled, no changes to meds. Encouraged heart healthy diet such as the DASH diet and exercise as tolerated.  °

## 2021-03-16 ENCOUNTER — Other Ambulatory Visit: Payer: Self-pay | Admitting: Family Medicine

## 2021-03-18 ENCOUNTER — Other Ambulatory Visit: Payer: Self-pay | Admitting: Family Medicine

## 2021-03-18 DIAGNOSIS — I10 Essential (primary) hypertension: Secondary | ICD-10-CM

## 2021-04-03 ENCOUNTER — Ambulatory Visit (HOSPITAL_COMMUNITY)
Admission: RE | Admit: 2021-04-03 | Discharge: 2021-04-03 | Disposition: A | Discharge status: Home / Self Care | Payer: Medicare (Managed Care) | Source: Ambulatory Visit | Attending: Internal Medicine | Admitting: Internal Medicine

## 2021-04-03 DIAGNOSIS — M329 Systemic lupus erythematosus, unspecified: Secondary | ICD-10-CM | POA: Insufficient documentation

## 2021-04-03 DIAGNOSIS — G629 Polyneuropathy, unspecified: Secondary | ICD-10-CM | POA: Insufficient documentation

## 2021-04-03 MED ORDER — ACETAMINOPHEN 325 MG PO TABS
ORAL_TABLET | ORAL | Status: AC
  Filled 2021-04-03: qty 2

## 2021-04-03 MED ORDER — ACETAMINOPHEN 325 MG PO TABS
650.0000 mg | ORAL_TABLET | ORAL | Status: DC
  Administered 2021-04-03: 650 mg via ORAL

## 2021-04-03 MED ORDER — DIPHENHYDRAMINE HCL 25 MG PO CAPS
25.0000 mg | ORAL_CAPSULE | ORAL | Status: DC
  Administered 2021-04-03: 25 mg via ORAL

## 2021-04-03 MED ORDER — DIPHENHYDRAMINE HCL 25 MG PO CAPS
ORAL_CAPSULE | ORAL | Status: AC
  Filled 2021-04-03: qty 1

## 2021-04-03 MED ORDER — IMMUNE GLOBULIN (HUMAN) 10 GM/100ML IV SOLN
70.0000 g | INTRAVENOUS | Status: DC
  Administered 2021-04-03: 70 g via INTRAVENOUS
  Filled 2021-04-03: qty 700

## 2021-04-08 ENCOUNTER — Other Ambulatory Visit: Payer: Self-pay | Admitting: Family Medicine

## 2021-04-10 ENCOUNTER — Other Ambulatory Visit: Payer: Self-pay | Admitting: Cardiovascular Disease

## 2021-04-14 ENCOUNTER — Other Ambulatory Visit: Payer: Self-pay | Admitting: Cardiovascular Disease

## 2021-05-01 ENCOUNTER — Other Ambulatory Visit: Payer: Self-pay | Admitting: Family Medicine

## 2021-05-21 ENCOUNTER — Other Ambulatory Visit: Payer: Self-pay | Admitting: Family Medicine

## 2021-05-21 ENCOUNTER — Other Ambulatory Visit: Payer: Self-pay | Admitting: Cardiovascular Disease

## 2021-05-21 DIAGNOSIS — I10 Essential (primary) hypertension: Secondary | ICD-10-CM

## 2021-05-22 ENCOUNTER — Telehealth: Payer: Self-pay | Admitting: Cardiovascular Disease

## 2021-05-22 NOTE — Telephone Encounter (Signed)
Pt c/o medication issue: ? ?1. Name of Medication: Clopidogrel ? ?2. How are you currently taking this medication (dosage and times per day)? N/a ? ?3. Are you having a reaction (difficulty breathing--STAT)? no ? ?4. What is your medication issue? Patient states that her pharmacy was unable to refill this medication. It looks like Dr. Angelena Form took her off when she was in the office on 12/15/20. She would like to know why, please advise.   ?

## 2021-05-22 NOTE — Telephone Encounter (Signed)
Called and adv patient that per ov note in Nov 2022:     ? ?Assessment and Plan:  ?  ?1. CAD without angina: She has no chest pain. Will continue ASA, beta blocker, statin and aldactone. Will stop Plavix.   ? ?She voices understanding and thanked me for the information. ? ?

## 2021-05-28 ENCOUNTER — Other Ambulatory Visit (HOSPITAL_COMMUNITY): Payer: Self-pay

## 2021-05-29 ENCOUNTER — Ambulatory Visit (HOSPITAL_COMMUNITY)
Admission: RE | Admit: 2021-05-29 | Discharge: 2021-05-29 | Disposition: A | Discharge status: Home / Self Care | Payer: Medicare (Managed Care) | Source: Ambulatory Visit | Attending: Internal Medicine | Admitting: Internal Medicine

## 2021-05-29 DIAGNOSIS — M329 Systemic lupus erythematosus, unspecified: Secondary | ICD-10-CM | POA: Diagnosis not present

## 2021-05-29 DIAGNOSIS — G629 Polyneuropathy, unspecified: Secondary | ICD-10-CM | POA: Diagnosis not present

## 2021-05-29 MED ORDER — ACETAMINOPHEN 325 MG PO TABS
ORAL_TABLET | ORAL | Status: AC
  Filled 2021-05-29: qty 2

## 2021-05-29 MED ORDER — DIPHENHYDRAMINE HCL 25 MG PO CAPS
ORAL_CAPSULE | ORAL | Status: AC
  Filled 2021-05-29: qty 1

## 2021-05-29 MED ORDER — DIPHENHYDRAMINE HCL 25 MG PO CAPS
25.0000 mg | ORAL_CAPSULE | Freq: Once | ORAL | Status: AC
  Administered 2021-05-29: 25 mg via ORAL

## 2021-05-29 MED ORDER — IMMUNE GLOBULIN (HUMAN) 10 GM/100ML IV SOLN
70.0000 g | INTRAVENOUS | Status: DC
  Administered 2021-05-29: 70 g via INTRAVENOUS
  Filled 2021-05-29: qty 700

## 2021-05-29 MED ORDER — ACETAMINOPHEN 325 MG PO TABS
650.0000 mg | ORAL_TABLET | Freq: Once | ORAL | Status: AC
  Administered 2021-05-29: 650 mg via ORAL

## 2021-07-24 ENCOUNTER — Ambulatory Visit (HOSPITAL_COMMUNITY)
Admission: RE | Admit: 2021-07-24 | Discharge: 2021-07-24 | Disposition: A | Discharge status: Home / Self Care | Payer: Medicare (Managed Care) | Source: Ambulatory Visit | Attending: Internal Medicine | Admitting: Internal Medicine

## 2021-07-24 DIAGNOSIS — M329 Systemic lupus erythematosus, unspecified: Secondary | ICD-10-CM | POA: Insufficient documentation

## 2021-07-24 DIAGNOSIS — G6289 Other specified polyneuropathies: Secondary | ICD-10-CM | POA: Diagnosis not present

## 2021-07-24 MED ORDER — DIPHENHYDRAMINE HCL 25 MG PO CAPS: 25.0000 mg | ORAL_CAPSULE | ORAL | Status: DC

## 2021-07-24 MED ORDER — ACETAMINOPHEN 325 MG PO TABS: 650.0000 mg | ORAL_TABLET | ORAL | Status: DC

## 2021-07-24 MED ORDER — IMMUNE GLOBULIN (HUMAN) 10 GM/100ML IV SOLN
70.0000 g | INTRAVENOUS | Status: DC
  Administered 2021-07-24: 70 g via INTRAVENOUS
  Filled 2021-07-24: qty 700

## 2021-07-24 MED ORDER — ACETAMINOPHEN 325 MG PO TABS
ORAL_TABLET | ORAL | Status: AC
  Administered 2021-07-24: 650 mg via ORAL
  Filled 2021-07-24: qty 2

## 2021-07-24 MED ORDER — DIPHENHYDRAMINE HCL 25 MG PO CAPS
ORAL_CAPSULE | ORAL | Status: AC
  Administered 2021-07-24: 25 mg via ORAL
  Filled 2021-07-24: qty 1

## 2021-08-14 ENCOUNTER — Other Ambulatory Visit: Payer: Self-pay | Admitting: Family Medicine

## 2021-08-14 DIAGNOSIS — Z1231 Encounter for screening mammogram for malignant neoplasm of breast: Secondary | ICD-10-CM

## 2021-08-20 ENCOUNTER — Other Ambulatory Visit: Payer: Self-pay | Admitting: Cardiovascular Disease

## 2021-08-30 ENCOUNTER — Ambulatory Visit (INDEPENDENT_AMBULATORY_CARE_PROVIDER_SITE_OTHER): Payer: Medicare (Managed Care)

## 2021-08-30 DIAGNOSIS — Z0000 Encounter for general adult medical examination without abnormal findings: Secondary | ICD-10-CM | POA: Diagnosis not present

## 2021-08-30 NOTE — Patient Instructions (Signed)
Samantha [name] , Thank you for taking time to come for your Medicare Wellness Visit. I appreciate your ongoing commitment to your health goals. Please review the following plan we discussed and let me know if I can assist you in the future.   Screening recommendations/referrals: Colonoscopy: 10/12/19 due 10/12/26 Mammogram: scheduled 09/06/21 Bone Density: N/A Recommended yearly ophthalmology/optometry visit for glaucoma screening and checkup Recommended yearly dental visit for hygiene and checkup  Vaccinations: Influenza vaccine: declined Pneumococcal vaccine: declined Tdap vaccine: up to date Shingles vaccine: declined  Covid-19: declined  Advanced directives: yes, on file  Conditions/risks identified: see problem list  Next appointment: Follow up in one year for your annual wellness visit. 09/02/21  Preventive Care 40-64 Years, Female Preventive care refers to lifestyle choices and visits with your health care provider that can promote health and wellness. What does preventive care include? A yearly physical exam. This is also called an annual well check. Dental exams once or twice a year. Routine eye exams. Ask your health care provider how often you should have your eyes checked. Personal lifestyle choices, including: Daily care of your teeth and gums. Regular physical activity. Eating a healthy diet. Avoiding tobacco and drug use. Limiting alcohol use. Practicing safe sex. Taking low-dose aspirin daily starting at age 66. Taking vitamin and mineral supplements as recommended by your health care provider. What happens during an annual well check? The services and screenings done by your health care provider during your annual well check will depend on your age, overall health, lifestyle risk factors, and family history of disease. Counseling  Your health care provider may ask you questions about your: Alcohol use. Tobacco use. Drug use. Emotional well-being. Home and  relationship well-being. Sexual activity. Eating habits. Work and work Statistician. Method of birth control. Menstrual cycle. Pregnancy history. Screening  You may have the following tests or measurements: Height, weight, and BMI. Blood pressure. Lipid and cholesterol levels. These may be checked every 5 years, or more frequently if you are over 17 years old. Skin check. Lung cancer screening. You may have this screening every year starting at age 61 if you have a 30-pack-year history of smoking and currently smoke or have quit within the past 15 years. Fecal occult blood test (FOBT) of the stool. You may have this test every year starting at age 28. Flexible sigmoidoscopy or colonoscopy. You may have a sigmoidoscopy every 5 years or a colonoscopy every 10 years starting at age 60. Hepatitis C blood test. Hepatitis B blood test. Sexually transmitted disease (STD) testing. Diabetes screening. This is done by checking your blood sugar (glucose) after you have not eaten for a while (fasting). You may have this done every 1-3 years. Mammogram. This may be done every 1-2 years. Talk to your health care provider about when you should start having regular mammograms. This may depend on whether you have a family history of breast cancer. BRCA-related cancer screening. This may be done if you have a family history of breast, ovarian, tubal, or peritoneal cancers. Pelvic exam and Pap test. This may be done every 3 years starting at age 62. Starting at age 27, this may be done every 5 years if you have a Pap test in combination with an HPV test. Bone density scan. This is done to screen for osteoporosis. You may have this scan if you are at high risk for osteoporosis. Discuss your test results, treatment options, and if necessary, the need for more tests with your health care  provider. Vaccines  Your health care provider may recommend certain vaccines, such as: Influenza vaccine. This is recommended  every year. Tetanus, diphtheria, and acellular pertussis (Tdap, Td) vaccine. You may need a Td booster every 10 years. Zoster vaccine. You may need this after age 54. Pneumococcal 13-valent conjugate (PCV13) vaccine. You may need this if you have certain conditions and were not previously vaccinated. Pneumococcal polysaccharide (PPSV23) vaccine. You may need one or two doses if you smoke cigarettes or if you have certain conditions. Talk to your health care provider about which screenings and vaccines you need and how often you need them. This information is not intended to replace advice given to you by your health care provider. Make sure you discuss any questions you have with your health care provider. Document Released: 02/10/2015 Document Revised: 10/04/2015 Document Reviewed: 11/15/2014 Elsevier Interactive Patient Education  2017 [place] Prevention in the Home Falls can cause injuries. They can happen to people of all ages. There are many things you can do to make your home safe and to help prevent falls. What can I do on the outside of my home? Regularly fix the edges of walkways and driveways and fix any cracks. Remove anything that might make you trip as you walk through a door, such as a raised step or threshold. Trim any bushes or trees on the path to your home. Use bright outdoor lighting. Clear any walking paths of anything that might make someone trip, such as rocks or tools. Regularly check to see if handrails are loose or broken. Make sure that both sides of any steps have handrails. Any raised decks and porches should have guardrails on the edges. Have any leaves, snow, or ice cleared regularly. Use sand or salt on walking paths during winter. Clean up any spills in your garage right away. This includes oil or grease spills. What can I do in the bathroom? Use night lights. Install grab bars by the toilet and in the tub and shower. Do not use towel bars as  grab bars. Use non-skid mats or decals in the tub or shower. If you need to sit down in the shower, use a plastic, non-slip stool. Keep the floor dry. Clean up any water that spills on the floor as soon as it happens. Remove soap buildup in the tub or shower regularly. Attach bath mats securely with double-sided non-slip rug tape. Do not have throw rugs and other things on the floor that can make you trip. What can I do in the bedroom? Use night lights. Make sure that you have a light by your bed that is easy to reach. Do not use any sheets or blankets that are too big for your bed. They should not hang down onto the floor. Have a firm chair that has side arms. You can use this for support while you get dressed. Do not have throw rugs and other things on the floor that can make you trip. What can I do in the kitchen? Clean up any spills right away. Avoid walking on wet floors. Keep items that you use a lot in easy-to-reach places. If you need to reach something above you, use a strong step stool that has a grab bar. Keep electrical cords out of the way. Do not use floor polish or wax that makes floors slippery. If you must use wax, use non-skid floor wax. Do not have throw rugs and other things on the floor that can make  you trip. What can I do with my stairs? Do not leave any items on the stairs. Make sure that there are handrails on both sides of the stairs and use them. Fix handrails that are broken or loose. Make sure that handrails are as long as the stairways. Check any carpeting to make sure that it is firmly attached to the stairs. Fix any carpet that is loose or worn. Avoid having throw rugs at the top or bottom of the stairs. If you do have throw rugs, attach them to the floor with carpet tape. Make sure that you have a light switch at the top of the stairs and the bottom of the stairs. If you do not have them, ask someone to add them for you. What else can I do to help prevent  falls? Wear shoes that: Do not have high heels. Have rubber bottoms. Are comfortable and fit you well. Are closed at the toe. Do not wear sandals. If you use a stepladder: Make sure that it is fully opened. Do not climb a closed stepladder. Make sure that both sides of the stepladder are locked into place. Ask someone to hold it for you, if possible. Clearly mark and make sure that you can see: Any grab bars or handrails. First and last steps. Where the edge of each step is. Use tools that help you move around (mobility aids) if they are needed. These include: Canes. Walkers. Scooters. Crutches. Turn on the lights when you go into a dark area. Replace any light bulbs as soon as they burn out. Set up your furniture so you have a clear path. Avoid moving your furniture around. If any of your floors are uneven, fix them. If there are any pets around you, be aware of where they are. Review your medicines with your doctor. Some medicines can make you feel dizzy. This can increase your chance of falling. Ask your doctor what other things that you can do to help prevent falls. This information is not intended to replace advice given to you by your health care provider. Make sure you discuss any questions you have with your health care provider. Document Released: 11/10/2008 Document Revised: 06/22/2015 Document Reviewed: 02/18/2014 Elsevier Interactive Patient Education  2017 [employer].

## 2021-08-30 NOTE — Progress Notes (Addendum)
Subjective:   Samantha [name] is a 61 y.o. female who presents for Medicare Annual (Subsequent) preventive examination.  I connected with  [name] on 08/30/21 by a audio enabled telemedicine application and verified that I am speaking with the correct person using two identifiers.  Patient Location: Home  Provider Location: Office/Clinic  I discussed the limitations of evaluation and management by telemedicine. The patient expressed understanding and agreed to proceed.   Review of Systems     Cardiac Risk Factors include: advanced age (>86mn, >>87women);hypertension;dyslipidemia     Objective:    There were no vitals filed for this visit. There is no height or weight on file to calculate BMI.     08/30/2021    9:04 AM 08/24/2020    9:44 AM 08/23/2019   10:20 AM 08/18/2018    9:13 AM 06/13/2018    1:35 PM 05/30/2018   10:04 AM 08/12/2017   10:17 AM  Advanced Directives  Does Patient Have a Medical Advance Directive? Yes Yes Yes Yes No Yes Yes  Type of AParamedicof [place]Living will [place]Living will [place]Living will Healthcare Power of [place] Living will HBeulahLiving will  Does patient want to make changes to medical advance directive? No - Patient declined  No - Patient declined No - Patient declined     Copy of [place]in Chart? Yes - validated most recent copy scanned in chart (See row information) Yes - validated most recent copy scanned in chart (See row information) Yes - validated most recent copy scanned in chart (See row information) Yes - validated most recent copy scanned in chart (See row information)   Yes  Would patient like information on creating a medical advance directive?     No - Patient declined      Current Medications (verified) Outpatient Encounter Medications as of 08/30/2021  Medication Sig   acetaminophen (TYLENOL) 500 MG  tablet Take 500 mg by mouth every 6 (six) hours as needed for moderate pain or headache.   aspirin EC 81 MG tablet Take 1 tablet (81 mg total) by mouth daily.   atorvastatin (LIPITOR) 80 MG tablet TAKE 1 TABLET BY MOUTH DAILY AT 6 PM. Please call [phone] schedule a November appointment for future refills. Thank you.   cyclobenzaprine (FLEXERIL) 10 MG tablet TAKE 1 TABLET(10 MG) BY MOUTH THREE TIMES DAILY AS NEEDED (Patient taking differently: Take 10 mg by mouth 3 (three) times daily as needed for muscle spasms.)   hydroxychloroquine (PLAQUENIL) 200 MG tablet Take 300 mg by mouth daily. Take 1 tab today and t tabs the next day (tab hard to break)   loperamide (IMODIUM A-D) 2 MG tablet Take 1 tablet (2 mg total) by mouth 4 (four) times daily as needed for diarrhea or loose stools.   metoprolol succinate (TOPROL-XL) 100 MG 24 hr tablet TAKE 1 TABLET (100 MG TOTAL) BY MOUTH DAILY. IMMEDIATELY WITH OR FOLLOWING A MEAL.   Multiple Vitamin (MULTIVITAMIN) tablet Take 1 tablet by mouth daily.   mycophenolate (CELLCEPT) 500 MG tablet Take 1,000 mg by mouth 2 (two) times daily.   nitroGLYCERIN (NITROSTAT) 0.4 MG SL tablet PLACE 1 TABLET (0.4 MG TOTAL) UNDER THE TONGUE EVERY 5 (FIVE) MINUTES AS NEEDED FOR CHEST PAIN.   pantoprazole (PROTONIX) 20 MG tablet TAKE 1 TABLET BY MOUTH EVERY DAY   spironolactone (ALDACTONE) 25 MG tablet Take 1 tablet (25 mg  total) by mouth daily. Please call [phone] to schedule a November appointment for future refills. Thank you.   No facility-administered encounter medications on file as of 08/30/2021.    Allergies (verified) Tramadol   History: Past Medical History:  Diagnosis Date   Altered mental status    Backache, unspecified    Gastric ulcer, unspecified as acute or chronic, without mention of hemorrhage, perforation, or obstruction    Helicobacter pylori (H. pylori)    Hyperlipidemia    Lupus (HCC)    STEMI (ST elevation myocardial infarction) ([place])     PCI to p/d RCA   Tobacco abuse    Tobacco use disorder 11/22/2011   Unspecified essential hypertension    Past Surgical History:  Procedure Laterality Date   COLONOSCOPY  10/12/2019   2016   CORONARY/GRAFT ACUTE MI REVASCULARIZATION N/A 05/30/2018   Procedure: Coronary/Graft Acute MI Revascularization;  Surgeon: Burnell Blanks, MD;  Location: [facility];  Service: Cardiovascular;  Laterality: N/A;   INTRAOPERATIVE TRANSESOPHAGEAL ECHOCARDIOGRAM N/A 05/30/2018   Procedure: Transesophageal Echocardiogram;  Surgeon: Grace Isaac, MD;  Location: [facility];  Service: Vascular;  Laterality: N/A;   LEFT HEART CATH AND CORONARY ANGIOGRAPHY N/A 05/30/2018   Procedure: LEFT HEART CATH AND CORONARY ANGIOGRAPHY;  Surgeon: Burnell Blanks, MD;  Location: [facility];  Service: Cardiovascular;  Laterality: N/A;   POLYPECTOMY     UPPER GASTROINTESTINAL ENDOSCOPY  2013   Family History  Problem Relation Age of Onset   Diabetes Sister    Hypertension Mother    Dementia Mother    Colon cancer Neg Hx    Stomach cancer Neg Hx    Pancreatic cancer Neg Hx    Colon polyps Neg Hx    Rectal cancer Neg Hx    Social History   Socioeconomic History   Marital status: Single    Spouse name: Not on file   Number of children: Not on file   Years of education: Not on file   Highest education level: Not on file  Occupational History   Occupation: permanant disability    Employer: [employer]  Tobacco Use   Smoking status: Every Day    Packs/day: 0.25    Years: 40.00    Total pack years: 10.00    Types: Cigarettes   Smokeless tobacco: Never  Vaping Use   Vaping Use: Never used  Substance and Sexual Activity   Alcohol use: Not Currently    Comment: 1 year since last drink    Drug use: No   Sexual activity: Not Currently    Partners: Male  Other Topics Concern   Not on file  Social History Narrative   Exercise--- walking   Social Determinants of Health   Financial  Resource Strain: Low Risk  (08/24/2020)   Overall Financial Resource Strain (CARDIA)    Difficulty of Paying Living Expenses: Not hard at all  Food Insecurity: No Food Insecurity (08/24/2020)   Hunger Vital Sign    Worried About Running Out of Food in the Last Year: Never true    Ran Out of Food in the Last Year: Never true  Transportation Needs: No Transportation Needs (08/24/2020)   PRAPARE - Hydrologist (Medical): No    Lack of Transportation (Non-Medical): No  Physical Activity: Inactive (08/24/2020)   Exercise Vital Sign    Days of Exercise per Week: 0 days    Minutes of Exercise per Session: 0 min  Stress:  No Stress Concern Present (08/24/2020)   [place]    Feeling of Stress : Not at all  Social Connections: Socially Isolated (08/24/2020)   Social Connection and Isolation Panel [NHANES]    Frequency of Communication with Friends and Family: More than three times a week    Frequency of Social Gatherings with Friends and Family: Once a week    Attends Religious Services: Never    Marine scientist or Organizations: No    Attends Music therapist: Never    Marital Status: Never married    Tobacco Counseling Ready to quit: Not Answered Counseling given: Not Answered   Clinical Intake:  Pre-visit preparation completed: Yes  Pain : No/denies pain     BMI - recorded: 21.36 Nutritional Status: BMI of 19-24  Normal Nutritional Risks: None Diabetes: No  How often do you need to have someone help you when you read instructions, pamphlets, or other written materials from your doctor or pharmacy?: 1 - Never  Diabetic?no  Interpreter Needed?: No  Information entered by :: Magaline Steinberg   Activities of Daily Living    08/30/2021    9:07 AM  In your present state of health, do you have any difficulty performing the following activities:  Hearing? 0  Vision? 0   Difficulty concentrating or making decisions? 0  Walking or climbing stairs? 0  Dressing or bathing? 0  Doing errands, shopping? 0  Preparing Food and eating ? N  Using the Toilet? N  In the past six months, have you accidently leaked urine? N  Do you have problems with loss of bowel control? N  Managing your Medications? N  Managing your Finances? N  Housekeeping or managing your Housekeeping? N    Patient Care Team: Carollee Herter, Alferd Apa, DO as PCP - General (Family Medicine) Burnell Blanks, MD as PCP - Cardiology (Cardiology) Candee Furbish, MD as Referring Physician (Rheumatology) Inda Castle, MD (Inactive) as Consulting Physician (Gastroenterology) Melissa Noon, [place] as Referring Physician (Optometry)  Indicate any recent Medical Services you may have received from other than [facility] providers in the past year (date may be approximate).     Assessment:   This is a routine wellness examination for Myrle.  Hearing/Vision screen No results found.  Dietary issues and exercise activities discussed: Current Exercise Habits: The patient does not participate in regular exercise at present, Exercise limited by: None identified   Goals Addressed             This Visit's Progress    Patient Stated   On track    Maintain current health     Quit smoking / using tobacco   Not on track      Depression Screen    08/30/2021    9:05 AM 08/24/2020    9:46 AM 08/23/2019   10:24 AM 08/18/2018    9:14 AM 08/12/2017   10:18 AM 02/11/2017    9:46 AM 01/26/2016    9:05 AM  PHQ 2/9 Scores  PHQ - 2 Score 0 0 0 0 0 0 0    Fall Risk    08/30/2021    9:04 AM 08/24/2020    9:45 AM 08/23/2019   10:24 AM 08/18/2018    9:14 AM 08/12/2017   10:18 AM  Fall Risk   Falls in the past year? 0 0 0 0 No  Number falls in past yr: 0 0 0  Injury with Fall? 0 0 0    Risk for fall due to : No Fall Risks      Follow up Falls evaluation completed Falls prevention discussed Education  provided;Falls prevention discussed      FALL RISK PREVENTION PERTAINING TO THE HOME:  Any stairs in or around the home? Yes  If so, are there any without handrails? No  Home free of loose throw rugs in walkways, pet beds, electrical cords, etc? Yes  Adequate lighting in your home to reduce risk of falls? Yes   ASSISTIVE DEVICES UTILIZED TO PREVENT FALLS:  Life alert? No  Use of a cane, walker or w/c? No  Grab bars in the bathroom? No  Shower chair or bench in shower? Yes  Elevated toilet seat or a handicapped toilet? No   TIMED UP AND GO:  Was the test performed? No .    Cognitive Function:    08/12/2017   10:18 AM  MMSE - Mini Mental State Exam  Orientation to time 5  Orientation to Place 5  Registration 3  Attention/ Calculation 5  Recall 1  Language- name 2 objects 2  Language- repeat 1  Language- follow 3 step command 3  Language- read & follow direction 1  Write a sentence 1  Copy design 1  Total score 28        08/30/2021    9:09 AM  6CIT Screen  What Year? 0 points  What month? 0 points  What time? 0 points  Count back from 20 0 points  Months in reverse 2 points  Repeat phrase 8 points  Total Score 10 points    Immunizations Immunization History  Administered Date(s) Administered   PFIZER(Purple Top)SARS-COV-2 Vaccination 05/17/2019, 06/07/2019, 10/06/2019   Tdap 09/15/2012    TDAP status: Up to date  Flu Vaccine status: Declined, Education has been provided regarding the importance of this vaccine but patient still declined. Advised may receive this vaccine at local pharmacy or Health Dept. Aware to provide a copy of the vaccination record if obtained from local pharmacy or Health Dept. Verbalized acceptance and understanding.  Pneumococcal vaccine status: Declined,  Education has been provided regarding the importance of this vaccine but patient still declined. Advised may receive this vaccine at local pharmacy or Health Dept. Aware to provide  a copy of the vaccination record if obtained from local pharmacy or Health Dept. Verbalized acceptance and understanding.   Covid-19 vaccine status: Declined, Education has been provided regarding the importance of this vaccine but patient still declined. Advised may receive this vaccine at local pharmacy or Health Dept.or vaccine clinic. Aware to provide a copy of the vaccination record if obtained from local pharmacy or Health Dept. Verbalized acceptance and understanding.  Qualifies for Shingles Vaccine? No   Zostavax completed No   Shingrix Completed?: No.    Education has been provided regarding the importance of this vaccine. Patient has been advised to call insurance company to determine out of pocket expense if they have not yet received this vaccine. Advised may also receive vaccine at local pharmacy or Health Dept. Verbalized acceptance and understanding.  Screening Tests Health Maintenance  Topic Date Due   Zoster Vaccines- Shingrix (1 of 2) Never done   PAP SMEAR-Modifier  12/25/2018   COVID-19 Vaccine (4 - Booster for Pfizer series) 12/01/2019   MAMMOGRAM  09/01/2022   TETANUS/TDAP  09/16/2022   COLONOSCOPY (Pts 45-19yr Insurance coverage will need to be confirmed)  10/12/2026   Hepatitis C  Screening  Completed   HIV Screening  Completed   HPV VACCINES  Aged Out    Health Maintenance  Health Maintenance Due  Topic Date Due   Zoster Vaccines- Shingrix (1 of 2) Never done   PAP SMEAR-Modifier  12/25/2018   COVID-19 Vaccine (4 - Booster for Pfizer series) 12/01/2019    Colorectal cancer screening: Type of screening: Colonoscopy. Completed 10/12/19. Repeat every 7 years  Mammogram status: Ordered scheduled 09/06/21. Pt provided with contact info and advised to call to schedule appt.     Lung Cancer Screening: (Low Dose CT Chest recommended if Age 63-80 years, 30 pack-year currently smoking OR have quit w/in 15years.) does qualify.   Lung Cancer Screening Referral: last  done 09/27/19  Additional Screening:  Hepatitis C Screening: does qualify; Completed 02/27/11  Vision Screening: Recommended annual ophthalmology exams for early detection of glaucoma and other disorders of the eye. Is the patient up to date with their annual eye exam?  Yes  Who is the provider or what is the name of the office in which the patient attends annual eye exams? N/A If pt is not established with a provider, would they like to be referred to a provider to establish care? No .   Dental Screening: Recommended annual dental exams for proper oral hygiene  Community Resource Referral / Chronic Care Management: CRR required this visit?  No   CCM required this visit?  No      Plan:     I have personally reviewed and noted the following in the patient's chart:   Medical and social history Use of alcohol, tobacco or illicit drugs  Current medications and supplements including opioid prescriptions.  Functional ability and status Nutritional status Physical activity Advanced directives List of other physicians Hospitalizations, surgeries, and ER visits in previous 12 months Vitals Screenings to include cognitive, depression, and falls Referrals and appointments  In addition, I have reviewed and discussed with patient certain preventive protocols, quality metrics, and best practice recommendations. A written personalized care plan for preventive services as well as general preventive health recommendations were provided to patient.   Due to this being a telephonic visit, the after visit summary with patients personalized plan was offered to patient via mail or my-chart.  Patient would like to access on my-chart.   Duard Brady Camran Keady, [place]   08/30/2021   Nurse Notes: none

## 2021-08-31 ENCOUNTER — Ambulatory Visit: Payer: Medicare (Managed Care)

## 2021-09-06 ENCOUNTER — Ambulatory Visit
Admission: RE | Admit: 2021-09-06 | Discharge: 2021-09-06 | Disposition: A | Discharge status: Home / Self Care | Payer: Medicare (Managed Care) | Source: Ambulatory Visit | Attending: Family Medicine | Admitting: Family Medicine

## 2021-09-06 DIAGNOSIS — Z1231 Encounter for screening mammogram for malignant neoplasm of breast: Secondary | ICD-10-CM

## 2021-09-18 ENCOUNTER — Ambulatory Visit (HOSPITAL_COMMUNITY)
Admission: RE | Admit: 2021-09-18 | Discharge: 2021-09-18 | Disposition: A | Discharge status: Home / Self Care | Payer: Medicare (Managed Care) | Source: Ambulatory Visit | Attending: Internal Medicine | Admitting: Internal Medicine

## 2021-09-18 DIAGNOSIS — D8989 Other specified disorders involving the immune mechanism, not elsewhere classified: Secondary | ICD-10-CM | POA: Diagnosis not present

## 2021-09-18 DIAGNOSIS — G629 Polyneuropathy, unspecified: Secondary | ICD-10-CM | POA: Diagnosis not present

## 2021-09-18 DIAGNOSIS — M3219 Other organ or system involvement in systemic lupus erythematosus: Secondary | ICD-10-CM | POA: Insufficient documentation

## 2021-09-18 DIAGNOSIS — L930 Discoid lupus erythematosus: Secondary | ICD-10-CM | POA: Diagnosis present

## 2021-09-18 MED ORDER — DIPHENHYDRAMINE HCL 25 MG PO CAPS
ORAL_CAPSULE | ORAL | Status: AC
  Filled 2021-09-18: qty 1

## 2021-09-18 MED ORDER — IMMUNE GLOBULIN (HUMAN) 10 GM/100ML IV SOLN
70.0000 g | INTRAVENOUS | Status: DC
  Administered 2021-09-18: 70 g via INTRAVENOUS
  Filled 2021-09-18: qty 700

## 2021-09-18 MED ORDER — DIPHENHYDRAMINE HCL 25 MG PO CAPS
25.0000 mg | ORAL_CAPSULE | ORAL | Status: DC
  Administered 2021-09-18: 25 mg via ORAL

## 2021-09-18 MED ORDER — ACETAMINOPHEN 325 MG PO TABS
650.0000 mg | ORAL_TABLET | ORAL | Status: DC
  Administered 2021-09-18: 650 mg via ORAL

## 2021-09-18 MED ORDER — ACETAMINOPHEN 325 MG PO TABS
ORAL_TABLET | ORAL | Status: AC
  Filled 2021-09-18: qty 2

## 2021-11-12 ENCOUNTER — Other Ambulatory Visit: Payer: Self-pay | Admitting: Cardiovascular Disease

## 2021-11-12 ENCOUNTER — Other Ambulatory Visit: Payer: Self-pay | Admitting: Family Medicine

## 2021-11-12 DIAGNOSIS — I10 Essential (primary) hypertension: Secondary | ICD-10-CM

## 2021-11-15 ENCOUNTER — Other Ambulatory Visit (HOSPITAL_COMMUNITY): Payer: Self-pay

## 2021-11-19 ENCOUNTER — Ambulatory Visit (HOSPITAL_COMMUNITY)
Admission: RE | Admit: 2021-11-19 | Discharge: 2021-11-19 | Disposition: A | Discharge status: Home / Self Care | Payer: Medicare (Managed Care) | Source: Ambulatory Visit | Attending: Internal Medicine | Admitting: Internal Medicine

## 2021-11-19 DIAGNOSIS — G629 Polyneuropathy, unspecified: Secondary | ICD-10-CM | POA: Insufficient documentation

## 2021-11-19 DIAGNOSIS — M329 Systemic lupus erythematosus, unspecified: Secondary | ICD-10-CM | POA: Insufficient documentation

## 2021-11-19 MED ORDER — ACETAMINOPHEN 325 MG PO TABS
ORAL_TABLET | ORAL | Status: AC
  Administered 2021-11-19: 650 mg via ORAL
  Filled 2021-11-19: qty 2

## 2021-11-19 MED ORDER — IMMUNE GLOBULIN (HUMAN) 10 GM/100ML IV SOLN
70.0000 g | INTRAVENOUS | Status: DC
  Administered 2021-11-19: 70 g via INTRAVENOUS
  Filled 2021-11-19: qty 700

## 2021-11-19 MED ORDER — DIPHENHYDRAMINE HCL 25 MG PO CAPS
ORAL_CAPSULE | ORAL | Status: AC
  Administered 2021-11-19: 25 mg via ORAL
  Filled 2021-11-19: qty 1

## 2021-11-19 MED ORDER — DIPHENHYDRAMINE HCL 25 MG PO CAPS
25.0000 mg | ORAL_CAPSULE | Freq: Once | ORAL | Status: AC
Start: 2021-11-19 — End: 2021-11-19

## 2021-11-19 MED ORDER — ACETAMINOPHEN 325 MG PO TABS: 650.0000 mg | ORAL_TABLET | Freq: Once | ORAL | Status: AC

## 2021-12-03 ENCOUNTER — Other Ambulatory Visit: Payer: Self-pay | Admitting: Family Medicine

## 2021-12-03 ENCOUNTER — Other Ambulatory Visit: Payer: Self-pay | Admitting: Cardiovascular Disease

## 2021-12-03 DIAGNOSIS — I10 Essential (primary) hypertension: Secondary | ICD-10-CM

## 2021-12-24 ENCOUNTER — Other Ambulatory Visit: Payer: Self-pay | Admitting: Cardiovascular Disease

## 2021-12-24 ENCOUNTER — Other Ambulatory Visit: Payer: Self-pay | Admitting: Family Medicine

## 2021-12-24 DIAGNOSIS — I10 Essential (primary) hypertension: Secondary | ICD-10-CM

## 2022-01-14 ENCOUNTER — Ambulatory Visit: Payer: Medicare (Managed Care) | Admitting: Family Medicine

## 2022-01-14 ENCOUNTER — Encounter: Payer: Self-pay | Admitting: Family Medicine

## 2022-01-14 VITALS — BP 116/68 | HR 66 | Temp 98.5°F | Resp 18 | Ht 68.0 in | Wt 146.6 lb

## 2022-01-14 DIAGNOSIS — I10 Essential (primary) hypertension: Secondary | ICD-10-CM | POA: Diagnosis not present

## 2022-01-14 DIAGNOSIS — R600 Localized edema: Secondary | ICD-10-CM | POA: Diagnosis not present

## 2022-01-14 DIAGNOSIS — E785 Hyperlipidemia, unspecified: Secondary | ICD-10-CM | POA: Diagnosis not present

## 2022-01-14 LAB — CBC WITH DIFFERENTIAL/PLATELET
Basophils Absolute: 0.1 10*3/uL (ref 0.0–0.1)
Basophils Relative: 1.3 % (ref 0.0–3.0)
Eosinophils Absolute: 0.1 10*3/uL (ref 0.0–0.7)
Eosinophils Relative: 1.9 % (ref 0.0–5.0)
HCT: 40.8 % (ref 36.0–46.0)
Hemoglobin: 13.3 g/dL (ref 12.0–15.0)
Lymphocytes Relative: 44.2 % (ref 12.0–46.0)
Lymphs Abs: 1.7 10*3/uL (ref 0.7–4.0)
MCHC: 32.6 g/dL (ref 30.0–36.0)
MCV: 89.8 fl (ref 78.0–100.0)
Monocytes Absolute: 0.5 10*3/uL (ref 0.1–1.0)
Monocytes Relative: 12.3 % — ABNORMAL HIGH (ref 3.0–12.0)
Neutro Abs: 1.6 10*3/uL (ref 1.4–7.7)
Neutrophils Relative %: 40.3 % — ABNORMAL LOW (ref 43.0–77.0)
Platelets: 205 10*3/uL (ref 150.0–400.0)
RBC: 4.54 Mil/uL (ref 3.87–5.11)
RDW: 13.8 % (ref 11.5–15.5)
WBC: 3.9 10*3/uL — ABNORMAL LOW (ref 4.0–10.5)

## 2022-01-14 LAB — LIPID PANEL
Cholesterol: 155 mg/dL (ref 0–200)
HDL: 71.8 mg/dL (ref >39.00)
LDL Cholesterol: 59 mg/dL (ref 0–99)
NonHDL: 82.9
Total CHOL/HDL Ratio: 2
Triglycerides: 119 mg/dL (ref 0.0–149.0)
VLDL: 23.8 mg/dL (ref 0.0–40.0)

## 2022-01-14 LAB — COMPREHENSIVE METABOLIC PANEL
ALT: 38 U/L — ABNORMAL HIGH (ref 0–35)
AST: 33 U/L (ref 0–37)
Albumin: 3.9 g/dL (ref 3.5–5.2)
Alkaline Phosphatase: 48 U/L (ref 39–117)
BUN: 14 mg/dL (ref 6–23)
CO2: 28 mEq/L (ref 19–32)
Calcium: 9.5 mg/dL (ref 8.4–10.5)
Chloride: 104 mEq/L (ref 96–112)
Creatinine, Ser: 0.67 mg/dL (ref 0.40–1.20)
GFR: 94.9 mL/min (ref >60.00)
Glucose, Bld: 86 mg/dL (ref 70–99)
Potassium: 4.5 mEq/L (ref 3.5–5.1)
Sodium: 140 mEq/L (ref 135–145)
Total Bilirubin: 0.4 mg/dL (ref 0.2–1.2)
Total Protein: 6.6 g/dL (ref 6.0–8.3)

## 2022-01-14 LAB — TSH: TSH: 0.71 u[IU]/mL (ref 0.35–5.50)

## 2022-01-14 MED ORDER — IPRATROPIUM-ALBUTEROL 0.5-2.5 (3) MG/3ML IN SOLN: 3.0000 mL | Freq: Four times a day (QID) | RESPIRATORY_TRACT | 2 refills | Status: DC | PRN

## 2022-01-14 NOTE — Progress Notes (Signed)
Subjective:   By signing my name below, I, Samantha [name], attest that this documentation has been prepared under the direction and in the presence of [name] DO 01/14/2022   Patient ID: Samantha [name], female    DOB: 06/16/1960, 61 y.o.   MRN: [id number]  Chief Complaint  Patient presents with   Hypertension   Follow-up    HPI Patient is in today for an office visit.  As of today's visit, her blood pressure is normal. BP Readings from Last 3 Encounters:  01/14/22 116/68  11/19/21 124/76  09/18/21 (!) 128/90   Pulse Readings from Last 3 Encounters:  01/14/22 66  11/19/21 75  09/18/21 67   She reports that swelling in her right lower extremity has not decreased since last visit. Symptoms do not bother her.   She is requesting a handicap form to be filled out.   She is not interested in receiving the Influenza or Pneumonia vaccines.   Past Medical History:  Diagnosis Date   Altered mental status    Backache, unspecified    Gastric ulcer, unspecified as acute or chronic, without mention of hemorrhage, perforation, or obstruction    Helicobacter pylori (H. pylori)    Hyperlipidemia    Lupus (HCC)    STEMI (ST elevation myocardial infarction) ([place])    PCI to p/d RCA   Tobacco abuse    Tobacco use disorder 11/22/2011   Unspecified essential hypertension     Past Surgical History:  Procedure Laterality Date   COLONOSCOPY  10/12/2019   2016   CORONARY/GRAFT ACUTE MI REVASCULARIZATION N/A 05/30/2018   Procedure: Coronary/Graft Acute MI Revascularization;  Surgeon: Burnell Blanks, MD;  Location: [facility];  Service: Cardiovascular;  Laterality: N/A;   INTRAOPERATIVE TRANSESOPHAGEAL ECHOCARDIOGRAM N/A 05/30/2018   Procedure: Transesophageal Echocardiogram;  Surgeon: Grace Isaac, MD;  Location: [facility];  Service: Vascular;  Laterality: N/A;   LEFT HEART CATH AND CORONARY ANGIOGRAPHY N/A 05/30/2018   Procedure: LEFT HEART CATH AND CORONARY  ANGIOGRAPHY;  Surgeon: Burnell Blanks, MD;  Location: [facility];  Service: Cardiovascular;  Laterality: N/A;   POLYPECTOMY     UPPER GASTROINTESTINAL ENDOSCOPY  2013    Family History  Problem Relation Age of Onset   Diabetes Sister    Hypertension Mother    Dementia Mother    Colon cancer Neg Hx    Stomach cancer Neg Hx    Pancreatic cancer Neg Hx    Colon polyps Neg Hx    Rectal cancer Neg Hx     Social History   Socioeconomic History   Marital status: Single    Spouse name: Not on file   Number of children: Not on file   Years of education: Not on file   Highest education level: Not on file  Occupational History   Occupation: permanant disability    Employer: [employer]  Tobacco Use   Smoking status: Every Day    Packs/day: 0.25    Years: 40.00    Total pack years: 10.00    Types: Cigarettes   Smokeless tobacco: Never  Vaping Use   Vaping Use: Never used  Substance and Sexual Activity   Alcohol use: Not Currently    Comment: 1 year since last drink    Drug use: No   Sexual activity: Not Currently    Partners: Male  Other Topics Concern   Not on file  Social History Narrative   Exercise--- walking  Social Determinants of Health   Financial Resource Strain: Low Risk  (08/24/2020)   Overall Financial Resource Strain (CARDIA)    Difficulty of Paying Living Expenses: Not hard at all  Food Insecurity: No Food Insecurity (08/24/2020)   Hunger Vital Sign    Worried About Running Out of Food in the Last Year: Never true    Ran Out of Food in the Last Year: Never true  Transportation Needs: No Transportation Needs (08/24/2020)   PRAPARE - Hydrologist (Medical): No    Lack of Transportation (Non-Medical): No  Physical Activity: Inactive (08/24/2020)   Exercise Vital Sign    Days of Exercise per Week: 0 days    Minutes of Exercise per Session: 0 min  Stress: No Stress Concern Present (08/24/2020)   [place]    Feeling of Stress : Not at all  Social Connections: Socially Isolated (08/24/2020)   Social Connection and Isolation Panel [NHANES]    Frequency of Communication with Friends and Family: More than three times a week    Frequency of Social Gatherings with Friends and Family: Once a week    Attends Religious Services: Never    Marine scientist or Organizations: No    Attends Archivist Meetings: Never    Marital Status: Never married  Intimate Partner Violence: Not At Risk (08/24/2020)   Humiliation, Afraid, Rape, and Kick questionnaire    Fear of Current or Ex-Partner: No    Emotionally Abused: No    Physically Abused: No    Sexually Abused: No    Outpatient Medications Prior to Visit  Medication Sig Dispense Refill   acetaminophen (TYLENOL) 500 MG tablet Take 500 mg by mouth every 6 (six) hours as needed for moderate pain or headache.     aspirin EC 81 MG tablet Take 1 tablet (81 mg total) by mouth daily.     atorvastatin (LIPITOR) 80 MG tablet TAKE 1 TABLET DAILY AT 6PM CALL [phone] for future refills to schedule an appointment. 30 tablet 0   cyclobenzaprine (FLEXERIL) 10 MG tablet TAKE 1 TABLET(10 MG) BY MOUTH THREE TIMES DAILY AS NEEDED (Patient taking differently: Take 10 mg by mouth 3 (three) times daily as needed for muscle spasms.) 30 tablet 0   hydroxychloroquine (PLAQUENIL) 200 MG tablet Take 300 mg by mouth daily. Take 1 tab today and t tabs the next day (tab hard to break)     loperamide (IMODIUM A-D) 2 MG tablet Take 1 tablet (2 mg total) by mouth 4 (four) times daily as needed for diarrhea or loose stools. 30 tablet 0   metoprolol succinate (TOPROL-XL) 100 MG 24 hr tablet Take 1 tablet (100 mg total) by mouth daily. Immediately with or following a meal. Pt needs a appointment for further refills 30 tablet 0   Multiple Vitamin (MULTIVITAMIN) tablet Take 1 tablet by mouth daily.     mycophenolate  (CELLCEPT) 500 MG tablet Take 1,000 mg by mouth 2 (two) times daily.     nitroGLYCERIN (NITROSTAT) 0.4 MG SL tablet PLACE 1 TABLET (0.4 MG TOTAL) UNDER THE TONGUE EVERY 5 (FIVE) MINUTES AS NEEDED FOR CHEST PAIN. 25 tablet 0   pantoprazole (PROTONIX) 20 MG tablet TAKE 1 TABLET BY MOUTH EVERY DAY 30 tablet 2   spironolactone (ALDACTONE) 25 MG tablet Take 1 tablet (25 mg total) by mouth daily. Please call our office to schedule your yearly follow up for any future  refills. [phone]. Thank you (first attempt) 90 tablet 1   No facility-administered medications prior to visit.    Allergies  Allergen Reactions   Tramadol Nausea Only    Review of Systems  Constitutional:  Negative for fever and malaise/fatigue.  HENT:  Negative for congestion.   Eyes:  Negative for blurred vision.  Respiratory:  Negative for cough and shortness of breath.   Cardiovascular:  Negative for chest pain, palpitations and leg swelling.  Gastrointestinal:  Negative for vomiting.  Musculoskeletal:  Negative for back pain.  Skin:  Negative for rash.  Neurological:  Negative for loss of consciousness and headaches.       Objective:    Physical Exam Vitals and nursing note reviewed.  Constitutional:      General: She is not in acute distress.    Appearance: Normal appearance. She is not ill-appearing.  HENT:     Head: Normocephalic and atraumatic.     Right Ear: External ear normal.     Left Ear: External ear normal.  Eyes:     Extraocular Movements: Extraocular movements intact.     Pupils: Pupils are equal, round, and reactive to light.  Cardiovascular:     Rate and Rhythm: Normal rate and regular rhythm.     Heart sounds: Normal heart sounds. No murmur heard.    No gallop.  Pulmonary:     Effort: Pulmonary effort is normal. No respiratory distress.     Breath sounds: Normal breath sounds. No wheezing or rales.  Musculoskeletal:     Right lower leg: 1+ Pitting Edema present.     Left lower leg: 1+  Edema (Trace) present.  Skin:    General: Skin is warm and dry.  Neurological:     Mental Status: She is alert and oriented to person, place, and time.  Psychiatric:        Judgment: Judgment normal.     BP 116/68 (BP Location: Right Arm, Patient Position: Sitting, Cuff Size: Normal)   Pulse 66   Temp 98.5 F (36.9 C) (Oral)   Resp 18   Ht '5\' 8"'$  (1.727 m)   Wt 146 lb 9.6 oz (66.5 kg)   LMP 04/11/2011   SpO2 96%   BMI 22.29 kg/m  Wt Readings from Last 3 Encounters:  01/14/22 146 lb 9.6 oz (66.5 kg)  11/19/21 142 lb (64.4 kg)  09/18/21 142 lb (64.4 kg)    Diabetic Foot Exam - Simple   No data filed    Lab Results  Component Value Date   WBC 3.9 (L) 01/14/2022   HGB 13.3 01/14/2022   HCT 40.8 01/14/2022   PLT 205.0 01/14/2022   GLUCOSE 86 01/14/2022   CHOL 155 01/14/2022   TRIG 119.0 01/14/2022   HDL 71.80 01/14/2022   LDLDIRECT 141.8 09/15/2012   LDLCALC 59 01/14/2022   ALT 38 (H) 01/14/2022   AST 33 01/14/2022   NA 140 01/14/2022   K 4.5 01/14/2022   CL 104 01/14/2022   CREATININE 0.67 01/14/2022   BUN 14 01/14/2022   CO2 28 01/14/2022   TSH 0.71 01/14/2022   INR 1.1 05/30/2018   HGBA1C 5.6 03/13/2011    Lab Results  Component Value Date   TSH 0.71 01/14/2022   Lab Results  Component Value Date   WBC 3.9 (L) 01/14/2022   HGB 13.3 01/14/2022   HCT 40.8 01/14/2022   MCV 89.8 01/14/2022   PLT 205.0 01/14/2022   Lab Results  Component Value Date  NA 140 01/14/2022   K 4.5 01/14/2022   CO2 28 01/14/2022   GLUCOSE 86 01/14/2022   BUN 14 01/14/2022   CREATININE 0.67 01/14/2022   BILITOT 0.4 01/14/2022   ALKPHOS 48 01/14/2022   AST 33 01/14/2022   ALT 38 (H) 01/14/2022   PROT 6.6 01/14/2022   ALBUMIN 3.9 01/14/2022   CALCIUM 9.5 01/14/2022   ANIONGAP 11 06/16/2018   GFR 94.90 01/14/2022   Lab Results  Component Value Date   CHOL 155 01/14/2022   Lab Results  Component Value Date   HDL 71.80 01/14/2022   Lab Results  Component  Value Date   LDLCALC 59 01/14/2022   Lab Results  Component Value Date   TRIG 119.0 01/14/2022   Lab Results  Component Value Date   CHOLHDL 2 01/14/2022   Lab Results  Component Value Date   HGBA1C 5.6 03/13/2011       Assessment & Plan:   Problem List Items Addressed This Visit       Unprioritized   Lower extremity edema   Relevant Orders   CBC with Differential/Platelet (Completed)   Comprehensive metabolic panel (Completed)   Lipid panel (Completed)   TSH (Completed)   Hyperlipidemia LDL goal <70    Encourage heart healthy diet such as MIND or DASH diet, increase exercise, avoid trans fats, simple carbohydrates and processed foods, consider a krill or fish or flaxseed oil cap daily.        Relevant Orders   CBC with Differential/Platelet (Completed)   Comprehensive metabolic panel (Completed)   Lipid panel (Completed)   TSH (Completed)   HTN (hypertension) - Primary    Well controlled, no changes to meds. Encouraged heart healthy diet such as the DASH diet and exercise as tolerated.        Relevant Orders   CBC with Differential/Platelet (Completed)   Comprehensive metabolic panel (Completed)   Lipid panel (Completed)   TSH (Completed)   Meds ordered this encounter  Medications   DISCONTD: ipratropium-albuterol (DUONEB) 0.5-2.5 (3) MG/3ML SOLN    Sig: Take 3 mLs by nebulization every 6 (six) hours as needed.    Dispense:  360 mL    Refill:  2    I, [name], DO, personally preformed the services described in this documentation.  All medical record entries made by the scribe were at my direction and in my presence.  I have reviewed the chart and discharge instructions (if applicable) and agree that the record reflects my personal performance and is accurate and complete. 01/14/2022   I,Amber Collins,acting as a scribe for [name], DO.,have documented all relevant documentation on the behalf of [name], DO,as directed  by  [name], DO while in the presence of [name], DO.    [name], DO

## 2022-01-14 NOTE — Assessment & Plan Note (Signed)
Well controlled, no changes to meds. Encouraged heart healthy diet such as the DASH diet and exercise as tolerated.  °

## 2022-01-14 NOTE — Assessment & Plan Note (Addendum)
Encourage heart healthy diet such as MIND or DASH diet, increase exercise, avoid trans fats, simple carbohydrates and processed foods, consider a krill or fish or flaxseed oil cap daily.  °

## 2022-01-17 ENCOUNTER — Other Ambulatory Visit: Payer: Self-pay | Admitting: Family Medicine

## 2022-01-17 DIAGNOSIS — I10 Essential (primary) hypertension: Secondary | ICD-10-CM

## 2022-01-22 ENCOUNTER — Encounter (HOSPITAL_COMMUNITY): Payer: Medicare (Managed Care)

## 2022-01-23 ENCOUNTER — Ambulatory Visit (HOSPITAL_COMMUNITY)
Admission: RE | Admit: 2022-01-23 | Discharge: 2022-01-23 | Disposition: A | Discharge status: Home / Self Care | Payer: Medicare (Managed Care) | Source: Ambulatory Visit | Attending: Internal Medicine | Admitting: Internal Medicine

## 2022-01-23 DIAGNOSIS — G6189 Other inflammatory polyneuropathies: Secondary | ICD-10-CM | POA: Diagnosis not present

## 2022-01-23 DIAGNOSIS — G629 Polyneuropathy, unspecified: Secondary | ICD-10-CM | POA: Insufficient documentation

## 2022-01-23 DIAGNOSIS — M3219 Other organ or system involvement in systemic lupus erythematosus: Secondary | ICD-10-CM | POA: Diagnosis not present

## 2022-01-23 DIAGNOSIS — M3210 Systemic lupus erythematosus, organ or system involvement unspecified: Secondary | ICD-10-CM | POA: Diagnosis present

## 2022-01-23 MED ORDER — IMMUNE GLOBULIN (HUMAN) 10 GM/100ML IV SOLN
70.0000 g | INTRAVENOUS | Status: DC
  Administered 2022-01-23: 70 g via INTRAVENOUS
  Filled 2022-01-23: qty 700

## 2022-01-23 MED ORDER — ACETAMINOPHEN 325 MG PO TABS
ORAL_TABLET | ORAL | Status: AC
  Administered 2022-01-23: 650 mg via ORAL
  Filled 2022-01-23: qty 2

## 2022-01-23 MED ORDER — ACETAMINOPHEN 325 MG PO TABS: 650.0000 mg | ORAL_TABLET | Freq: Once | ORAL | Status: AC

## 2022-01-23 MED ORDER — DIPHENHYDRAMINE HCL 25 MG PO CAPS: 25.0000 mg | ORAL_CAPSULE | Freq: Once | ORAL | Status: AC

## 2022-01-23 MED ORDER — DIPHENHYDRAMINE HCL 25 MG PO CAPS
ORAL_CAPSULE | ORAL | Status: AC
  Administered 2022-01-23: 25 mg via ORAL
  Filled 2022-01-23: qty 1

## 2022-02-20 ENCOUNTER — Other Ambulatory Visit: Payer: Self-pay | Admitting: Cardiovascular Disease

## 2022-03-18 ENCOUNTER — Other Ambulatory Visit: Payer: Self-pay | Admitting: Cardiovascular Disease

## 2022-03-26 ENCOUNTER — Ambulatory Visit (HOSPITAL_COMMUNITY)
Admission: RE | Admit: 2022-03-26 | Discharge: 2022-03-26 | Disposition: A | Discharge status: Home / Self Care | Payer: Medicare (Managed Care) | Source: Ambulatory Visit | Attending: Internal Medicine | Admitting: Internal Medicine

## 2022-03-26 DIAGNOSIS — G6189 Other inflammatory polyneuropathies: Secondary | ICD-10-CM | POA: Diagnosis present

## 2022-03-26 DIAGNOSIS — M3219 Other organ or system involvement in systemic lupus erythematosus: Secondary | ICD-10-CM | POA: Insufficient documentation

## 2022-03-26 MED ORDER — ACETAMINOPHEN 325 MG PO TABS: 650.0000 mg | ORAL_TABLET | Freq: Once | ORAL | Status: AC

## 2022-03-26 MED ORDER — DIPHENHYDRAMINE HCL 25 MG PO CAPS
ORAL_CAPSULE | ORAL | Status: AC
  Administered 2022-03-26: 25 mg via ORAL
  Filled 2022-03-26: qty 1

## 2022-03-26 MED ORDER — IMMUNE GLOBULIN (HUMAN) 10 GM/100ML IV SOLN
70.0000 g | INTRAVENOUS | Status: DC
  Administered 2022-03-26: 70 g via INTRAVENOUS
  Filled 2022-03-26: qty 700

## 2022-03-26 MED ORDER — DIPHENHYDRAMINE HCL 25 MG PO CAPS: 25.0000 mg | ORAL_CAPSULE | Freq: Once | ORAL | Status: AC

## 2022-03-26 MED ORDER — ACETAMINOPHEN 325 MG PO TABS
ORAL_TABLET | ORAL | Status: AC
  Administered 2022-03-26: 650 mg via ORAL
  Filled 2022-03-26: qty 2

## 2022-04-08 ENCOUNTER — Other Ambulatory Visit: Payer: Self-pay | Admitting: Cardiovascular Disease

## 2022-04-17 ENCOUNTER — Other Ambulatory Visit: Payer: Self-pay | Admitting: Cardiovascular Disease

## 2022-05-07 ENCOUNTER — Other Ambulatory Visit: Payer: Self-pay | Admitting: Cardiovascular Disease

## 2022-05-07 ENCOUNTER — Other Ambulatory Visit: Payer: Self-pay | Admitting: Family Medicine

## 2022-05-07 DIAGNOSIS — I10 Essential (primary) hypertension: Secondary | ICD-10-CM

## 2022-05-17 ENCOUNTER — Other Ambulatory Visit: Payer: Self-pay | Admitting: Cardiovascular Disease

## 2022-05-21 ENCOUNTER — Other Ambulatory Visit: Payer: Self-pay | Admitting: Family Medicine

## 2022-05-21 DIAGNOSIS — I10 Essential (primary) hypertension: Secondary | ICD-10-CM

## 2022-05-24 ENCOUNTER — Other Ambulatory Visit (HOSPITAL_COMMUNITY): Payer: Self-pay | Admitting: *Deleted

## 2022-05-27 ENCOUNTER — Ambulatory Visit (HOSPITAL_COMMUNITY)
Admission: RE | Admit: 2022-05-27 | Discharge: 2022-05-27 | Disposition: A | Discharge status: Home / Self Care | Payer: Medicare HMO | Source: Ambulatory Visit | Attending: Internal Medicine | Admitting: Internal Medicine

## 2022-05-27 DIAGNOSIS — G6189 Other inflammatory polyneuropathies: Secondary | ICD-10-CM | POA: Diagnosis not present

## 2022-05-27 DIAGNOSIS — M3219 Other organ or system involvement in systemic lupus erythematosus: Secondary | ICD-10-CM | POA: Insufficient documentation

## 2022-05-27 MED ORDER — DIPHENHYDRAMINE HCL 25 MG PO CAPS
ORAL_CAPSULE | ORAL | Status: AC
  Filled 2022-05-27: qty 1

## 2022-05-27 MED ORDER — ACETAMINOPHEN 325 MG PO TABS
ORAL_TABLET | ORAL | Status: AC
  Filled 2022-05-27: qty 2

## 2022-05-27 MED ORDER — ACETAMINOPHEN 325 MG PO TABS
650.0000 mg | ORAL_TABLET | ORAL | Status: DC
  Administered 2022-05-27: 650 mg via ORAL

## 2022-05-27 MED ORDER — DIPHENHYDRAMINE HCL 25 MG PO CAPS
25.0000 mg | ORAL_CAPSULE | ORAL | Status: DC
  Administered 2022-05-27: 25 mg via ORAL

## 2022-05-27 MED ORDER — IMMUNE GLOBULIN (HUMAN) 10 GM/100ML IV SOLN
70.0000 g | INTRAVENOUS | Status: DC
  Administered 2022-05-27: 70 g via INTRAVENOUS
  Filled 2022-05-27: qty 700

## 2022-06-08 ENCOUNTER — Other Ambulatory Visit: Payer: Self-pay | Admitting: Cardiovascular Disease

## 2022-06-22 ENCOUNTER — Other Ambulatory Visit: Payer: Self-pay | Admitting: Cardiovascular Disease

## 2022-07-21 ENCOUNTER — Other Ambulatory Visit: Payer: Self-pay | Admitting: Cardiovascular Disease

## 2022-07-21 ENCOUNTER — Other Ambulatory Visit: Payer: Self-pay | Admitting: Family Medicine

## 2022-07-21 DIAGNOSIS — I10 Essential (primary) hypertension: Secondary | ICD-10-CM

## 2022-07-29 ENCOUNTER — Ambulatory Visit (HOSPITAL_COMMUNITY)
Admission: RE | Admit: 2022-07-29 | Discharge: 2022-07-29 | Disposition: A | Discharge status: Home / Self Care | Payer: Medicare (Managed Care) | Source: Ambulatory Visit | Attending: Internal Medicine | Admitting: Internal Medicine

## 2022-07-29 DIAGNOSIS — M3219 Other organ or system involvement in systemic lupus erythematosus: Secondary | ICD-10-CM | POA: Insufficient documentation

## 2022-07-29 DIAGNOSIS — G6189 Other inflammatory polyneuropathies: Secondary | ICD-10-CM | POA: Diagnosis not present

## 2022-07-29 MED ORDER — ACETAMINOPHEN 325 MG PO TABS
ORAL_TABLET | ORAL | Status: AC
  Filled 2022-07-29: qty 2

## 2022-07-29 MED ORDER — ACETAMINOPHEN 325 MG PO TABS
650.0000 mg | ORAL_TABLET | ORAL | Status: DC
  Administered 2022-07-29: 650 mg via ORAL

## 2022-07-29 MED ORDER — DIPHENHYDRAMINE HCL 25 MG PO CAPS
25.0000 mg | ORAL_CAPSULE | ORAL | Status: DC
  Administered 2022-07-29: 25 mg via ORAL

## 2022-07-29 MED ORDER — DIPHENHYDRAMINE HCL 25 MG PO CAPS
ORAL_CAPSULE | ORAL | Status: AC
  Filled 2022-07-29: qty 1

## 2022-07-29 MED ORDER — IMMUNE GLOBULIN (HUMAN) 10 GM/100ML IV SOLN
70.0000 g | INTRAVENOUS | Status: DC
  Administered 2022-07-29: 70 g via INTRAVENOUS
  Filled 2022-07-29: qty 700

## 2022-08-18 ENCOUNTER — Other Ambulatory Visit: Payer: Self-pay | Admitting: Family Medicine

## 2022-08-18 DIAGNOSIS — I10 Essential (primary) hypertension: Secondary | ICD-10-CM

## 2022-08-19 ENCOUNTER — Encounter: Payer: Self-pay | Admitting: *Deleted

## 2022-08-29 ENCOUNTER — Other Ambulatory Visit: Payer: Self-pay | Admitting: Family Medicine

## 2022-08-29 DIAGNOSIS — Z1231 Encounter for screening mammogram for malignant neoplasm of breast: Secondary | ICD-10-CM

## 2022-08-31 ENCOUNTER — Other Ambulatory Visit: Payer: Self-pay | Admitting: Cardiovascular Disease

## 2022-09-03 ENCOUNTER — Ambulatory Visit (INDEPENDENT_AMBULATORY_CARE_PROVIDER_SITE_OTHER): Payer: Medicare (Managed Care) | Admitting: *Deleted

## 2022-09-03 DIAGNOSIS — Z0000 Encounter for general adult medical examination without abnormal findings: Secondary | ICD-10-CM

## 2022-09-03 NOTE — Patient Instructions (Signed)
Samantha [name] , Thank you for taking time to come for your Medicare Wellness Visit. I appreciate your ongoing commitment to your health goals. Please review the following plan we discussed and let me know if I can assist you in the future.     This is a list of the screening recommended for you and due dates:  Health Maintenance  Topic Date Due   Zoster (Shingles) Vaccine (1 of 2) Never done   Pap Smear  12/24/2020   COVID-19 Vaccine (4 - 2023-24 season) 09/28/2021   Flu Shot  08/29/2022   DTaP/Tdap/Td vaccine (2 - Td or Tdap) 09/16/2022   Medicare Annual Wellness Visit  09/03/2023   Mammogram  09/07/2023   Colon Cancer Screening  10/12/2026   Hepatitis C Screening  Completed   HIV Screening  Completed   HPV Vaccine  Aged Out      Next appointment: Follow up in one year for your annual wellness visit.   Preventive Care 40-64 Years, Female Preventive care refers to lifestyle choices and visits with your health care provider that can promote health and wellness. What does preventive care include? A yearly physical exam. This is also called an annual well check. Dental exams once or twice a year. Routine eye exams. Ask your health care provider how often you should have your eyes checked. Personal lifestyle choices, including: Daily care of your teeth and gums. Regular physical activity. Eating a healthy diet. Avoiding tobacco and drug use. Limiting alcohol use. Practicing safe sex. Taking low-dose aspirin daily starting at age 77. Taking vitamin and mineral supplements as recommended by your health care provider. What happens during an annual well check? The services and screenings done by your health care provider during your annual well check will depend on your age, overall health, lifestyle risk factors, and family history of disease. Counseling  Your health care provider may ask you questions about your: Alcohol use. Tobacco use. Drug use. Emotional well-being. Home  and relationship well-being. Sexual activity. Eating habits. Work and work Astronomer. Method of birth control. Menstrual cycle. Pregnancy history. Screening  You may have the following tests or measurements: Height, weight, and BMI. Blood pressure. Lipid and cholesterol levels. These may be checked every 5 years, or more frequently if you are over 22 years old. Skin check. Lung cancer screening. You may have this screening every year starting at age 5 if you have a 30-pack-year history of smoking and currently smoke or have quit within the past 15 years. Fecal occult blood test (FOBT) of the stool. You may have this test every year starting at age 65. Flexible sigmoidoscopy or colonoscopy. You may have a sigmoidoscopy every 5 years or a colonoscopy every 10 years starting at age 50. Hepatitis C blood test. Hepatitis B blood test. Sexually transmitted disease (STD) testing. Diabetes screening. This is done by checking your blood sugar (glucose) after you have not eaten for a while (fasting). You may have this done every 1-3 years. Mammogram. This may be done every 1-2 years. Talk to your health care provider about when you should start having regular mammograms. This may depend on whether you have a family history of breast cancer. BRCA-related cancer screening. This may be done if you have a family history of breast, ovarian, tubal, or peritoneal cancers. Pelvic exam and Pap test. This may be done every 3 years starting at age 9. Starting at age 34, this may be done every 5 years if you have a Pap test  in combination with an HPV test. Bone density scan. This is done to screen for osteoporosis. You may have this scan if you are at high risk for osteoporosis. Discuss your test results, treatment options, and if necessary, the need for more tests with your health care provider. Vaccines  Your health care provider may recommend certain vaccines, such as: Influenza vaccine. This is  recommended every year. Tetanus, diphtheria, and acellular pertussis (Tdap, Td) vaccine. You may need a Td booster every 10 years. Zoster vaccine. You may need this after age 33. Pneumococcal 13-valent conjugate (PCV13) vaccine. You may need this if you have certain conditions and were not previously vaccinated. Pneumococcal polysaccharide (PPSV23) vaccine. You may need one or two doses if you smoke cigarettes or if you have certain conditions. Talk to your health care provider about which screenings and vaccines you need and how often you need them. This information is not intended to replace advice given to you by your health care provider. Make sure you discuss any questions you have with your health care provider. Document Released: 02/10/2015 Document Revised: 10/04/2015 Document Reviewed: 11/15/2014 Elsevier Interactive Patient Education  2017 [employer].    Fall Prevention in the Home Falls can cause injuries. They can happen to people of all ages. There are many things you can do to make your home safe and to help prevent falls. What can I do on the outside of my home? Regularly fix the edges of walkways and driveways and fix any cracks. Remove anything that might make you trip as you walk through a door, such as a raised step or threshold. Trim any bushes or trees on the path to your home. Use bright outdoor lighting. Clear any walking paths of anything that might make someone trip, such as rocks or tools. Regularly check to see if handrails are loose or broken. Make sure that both sides of any steps have handrails. Any raised decks and porches should have guardrails on the edges. Have any leaves, snow, or ice cleared regularly. Use sand or salt on walking paths during winter. Clean up any spills in your garage right away. This includes oil or grease spills. What can I do in the bathroom? Use night lights. Install grab bars by the toilet and in the tub and shower. Do not use  towel bars as grab bars. Use non-skid mats or decals in the tub or shower. If you need to sit down in the shower, use a plastic, non-slip stool. Keep the floor dry. Clean up any water that spills on the floor as soon as it happens. Remove soap buildup in the tub or shower regularly. Attach bath mats securely with double-sided non-slip rug tape. Do not have throw rugs and other things on the floor that can make you trip. What can I do in the bedroom? Use night lights. Make sure that you have a light by your bed that is easy to reach. Do not use any sheets or blankets that are too big for your bed. They should not hang down onto the floor. Have a firm chair that has side arms. You can use this for support while you get dressed. Do not have throw rugs and other things on the floor that can make you trip. What can I do in the kitchen? Clean up any spills right away. Avoid walking on wet floors. Keep items that you use a lot in easy-to-reach places. If you need to reach something above you, use a strong step  stool that has a grab bar. Keep electrical cords out of the way. Do not use floor polish or wax that makes floors slippery. If you must use wax, use non-skid floor wax. Do not have throw rugs and other things on the floor that can make you trip. What can I do with my stairs? Do not leave any items on the stairs. Make sure that there are handrails on both sides of the stairs and use them. Fix handrails that are broken or loose. Make sure that handrails are as long as the stairways. Check any carpeting to make sure that it is firmly attached to the stairs. Fix any carpet that is loose or worn. Avoid having throw rugs at the top or bottom of the stairs. If you do have throw rugs, attach them to the floor with carpet tape. Make sure that you have a light switch at the top of the stairs and the bottom of the stairs. If you do not have them, ask someone to add them for you. What else can I do to  help prevent falls? Wear shoes that: Do not have high heels. Have rubber bottoms. Are comfortable and fit you well. Are closed at the toe. Do not wear sandals. If you use a stepladder: Make sure that it is fully opened. Do not climb a closed stepladder. Make sure that both sides of the stepladder are locked into place. Ask someone to hold it for you, if possible. Clearly mark and make sure that you can see: Any grab bars or handrails. First and last steps. Where the edge of each step is. Use tools that help you move around (mobility aids) if they are needed. These include: Canes. Walkers. Scooters. Crutches. Turn on the lights when you go into a dark area. Replace any light bulbs as soon as they burn out. Set up your furniture so you have a clear path. Avoid moving your furniture around. If any of your floors are uneven, fix them. If there are any pets around you, be aware of where they are. Review your medicines with your doctor. Some medicines can make you feel dizzy. This can increase your chance of falling. Ask your doctor what other things that you can do to help prevent falls. This information is not intended to replace advice given to you by your health care provider. Make sure you discuss any questions you have with your health care provider. Document Released: 11/10/2008 Document Revised: 06/22/2015 Document Reviewed: 02/18/2014 Elsevier Interactive Patient Education  2017 [employer].

## 2022-09-03 NOTE — Progress Notes (Signed)
Subjective:   Samantha [name] is a 62 y.o. female who presents for Medicare Annual (Subsequent) preventive examination.  Visit Complete: Virtual  I connected with  [name] on 09/03/22 by a audio enabled telemedicine application and verified that I am speaking with the correct person using two identifiers.  Patient Location: Home  Provider Location: Office/Clinic  I discussed the limitations of evaluation and management by telemedicine. The patient expressed understanding and agreed to proceed.   Review of Systems     Cardiac Risk Factors include: dyslipidemia;hypertension;smoking/ tobacco exposure     Objective:    Vital Signs: Patient was unable to self-report vital signs via telehealth due to a lack of equipment at home.      09/03/2022    4:01 PM 08/30/2021    9:04 AM 08/24/2020    9:44 AM 08/23/2019   10:20 AM 08/18/2018    9:13 AM 06/13/2018    1:35 PM 05/30/2018   10:04 AM  Advanced Directives  Does Patient Have a Medical Advance Directive? Yes Yes Yes Yes Yes No Yes  Type of Estate agent of [place];Living will Healthcare Power of [place];Living will Healthcare Power of [place];Living will Healthcare Power of [place];Living will Healthcare Power of Attorney  Living will  Does patient want to make changes to medical advance directive? No - Patient declined No - Patient declined  No - Patient declined No - Patient declined    Copy of Healthcare Power of Attorney in Chart? Yes - validated most recent copy scanned in chart (See row information) Yes - validated most recent copy scanned in chart (See row information) Yes - validated most recent copy scanned in chart (See row information) Yes - validated most recent copy scanned in chart (See row information) Yes - validated most recent copy scanned in chart (See row information)    Would patient like information on creating a medical advance directive?      No - Patient declined     Current  Medications (verified) Outpatient Encounter Medications as of 09/03/2022  Medication Sig   acetaminophen (TYLENOL) 500 MG tablet Take 500 mg by mouth every 6 (six) hours as needed for moderate pain or headache.   aspirin EC 81 MG tablet Take 1 tablet (81 mg total) by mouth daily.   atorvastatin (LIPITOR) 80 MG tablet TAKE 1 TABLET BY MOUTH EVERY DAY AT 6 PM   cyclobenzaprine (FLEXERIL) 10 MG tablet TAKE 1 TABLET(10 MG) BY MOUTH THREE TIMES DAILY AS NEEDED (Patient taking differently: Take 10 mg by mouth 3 (three) times daily as needed for muscle spasms.)   hydroxychloroquine (PLAQUENIL) 200 MG tablet Take 300 mg by mouth daily. Take 1 tab today and t tabs the next day (tab hard to break)   loperamide (IMODIUM A-D) 2 MG tablet Take 1 tablet (2 mg total) by mouth 4 (four) times daily as needed for diarrhea or loose stools.   metoprolol succinate (TOPROL-XL) 100 MG 24 hr tablet TAKE 1 TABLET (100 MG TOTAL) BY MOUTH DAILY. IMMEDIATELY WITH OR FOLLOWING A MEAL.   Multiple Vitamin (MULTIVITAMIN) tablet Take 1 tablet by mouth daily.   mycophenolate (CELLCEPT) 500 MG tablet Take 1,000 mg by mouth 2 (two) times daily.   nitroGLYCERIN (NITROSTAT) 0.4 MG SL tablet PLACE 1 TABLET (0.4 MG TOTAL) UNDER THE TONGUE EVERY 5 (FIVE) MINUTES AS NEEDED FOR CHEST PAIN.   pantoprazole (PROTONIX) 20 MG tablet TAKE 1 TABLET BY MOUTH EVERY DAY   spironolactone (ALDACTONE) 25 MG  tablet Take 1 tablet (25 mg total) by mouth daily.   No facility-administered encounter medications on file as of 09/03/2022.    Allergies (verified) Tramadol   History: Past Medical History:  Diagnosis Date   Altered mental status    Backache, unspecified    Gastric ulcer, unspecified as acute or chronic, without mention of hemorrhage, perforation, or obstruction    Helicobacter pylori (H. pylori)    Hyperlipidemia    Lupus (HCC)    STEMI (ST elevation myocardial infarction) (HCC)    PCI to p/d RCA   Tobacco abuse    Tobacco use disorder  11/22/2011   Unspecified essential hypertension    Past Surgical History:  Procedure Laterality Date   COLONOSCOPY  10/12/2019   2016   CORONARY/GRAFT ACUTE MI REVASCULARIZATION N/A 05/30/2018   Procedure: Coronary/Graft Acute MI Revascularization;  Surgeon: Kathleene Hazel, MD;  Location: [facility];  Service: Cardiovascular;  Laterality: N/A;   INTRAOPERATIVE TRANSESOPHAGEAL ECHOCARDIOGRAM N/A 05/30/2018   Procedure: Transesophageal Echocardiogram;  Surgeon: Delight Ovens, MD;  Location: [facility] OR;  Service: Vascular;  Laterality: N/A;   LEFT HEART CATH AND CORONARY ANGIOGRAPHY N/A 05/30/2018   Procedure: LEFT HEART CATH AND CORONARY ANGIOGRAPHY;  Surgeon: Kathleene Hazel, MD;  Location: [facility];  Service: Cardiovascular;  Laterality: N/A;   POLYPECTOMY     UPPER GASTROINTESTINAL ENDOSCOPY  2013   Family History  Problem Relation Age of Onset   Diabetes Sister    Hypertension Mother    Dementia Mother    Colon cancer Neg Hx    Stomach cancer Neg Hx    Pancreatic cancer Neg Hx    Colon polyps Neg Hx    Rectal cancer Neg Hx    Social History   Socioeconomic History   Marital status: Single    Spouse name: Not on file   Number of children: Not on file   Years of education: Not on file   Highest education level: Not on file  Occupational History   Occupation: permanant disability    Employer: [employer]  Tobacco Use   Smoking status: Every Day    Current packs/day: 0.25    Average packs/day: 0.3 packs/day for 40.0 years (10.0 ttl pk-yrs)    Types: Cigarettes   Smokeless tobacco: Never  Vaping Use   Vaping status: Never Used  Substance and Sexual Activity   Alcohol use: Not Currently    Comment: 1 year since last drink    Drug use: No   Sexual activity: Not Currently    Partners: Male  Other Topics Concern   Not on file  Social History Narrative   Exercise--- walking   Social Determinants of Health   Financial Resource Strain: Low  Risk  (09/03/2022)   Overall Financial Resource Strain (CARDIA)    Difficulty of Paying Living Expenses: Not very hard  Food Insecurity: No Food Insecurity (09/03/2022)   Hunger Vital Sign    Worried About Running Out of Food in the Last Year: Never true    Ran Out of Food in the Last Year: Never true  Transportation Needs: No Transportation Needs (09/03/2022)   PRAPARE - Administrator, Civil Service (Medical): No    Lack of Transportation (Non-Medical): No  Physical Activity: Inactive (09/03/2022)   Exercise Vital Sign    Days of Exercise per Week: 0 days    Minutes of Exercise per Session: 0 min  Stress: No Stress Concern Present (09/03/2022)  [employer] of Occupational Health - Occupational Stress Questionnaire    Feeling of Stress : Not at all  Social Connections: Socially Isolated (09/03/2022)   Social Connection and Isolation Panel [NHANES]    Frequency of Communication with Friends and Family: More than three times a week    Frequency of Social Gatherings with Friends and Family: Once a week    Attends Religious Services: Never    Database administrator or Organizations: No    Attends Engineer, structural: Never    Marital Status: Never married    Tobacco Counseling Ready to quit: Not Answered Counseling given: Not Answered   Clinical Intake:  Pre-visit preparation completed: Yes  Pain : No/denies pain  Nutritional Risks: None Diabetes: No  How often do you need to have someone help you when you read instructions, pamphlets, or other written materials from your doctor or pharmacy?: 1 - Never  Interpreter Needed?: No  Information entered by :: [employer], CMA   Activities of Daily Living    09/03/2022    3:59 PM  In your present state of health, do you have any difficulty performing the following activities:  Hearing? 0  Vision? 0  Difficulty concentrating or making decisions? 0  Walking or climbing stairs? 0  Dressing or bathing? 0   Doing errands, shopping? 0  Preparing Food and eating ? N  Using the Toilet? N  In the past six months, have you accidently leaked urine? N  Do you have problems with loss of bowel control? N  Managing your Medications? N  Managing your Finances? N  Housekeeping or managing your Housekeeping? N    Patient Care Team: Zola Button, Grayling Congress, DO as PCP - General (Family Medicine) Kathleene Hazel, MD as PCP - Cardiology (Cardiology) Randall Hiss, MD as Referring Physician (Rheumatology) Louis Meckel, MD (Inactive) as Consulting Physician (Gastroenterology) Manning Charity, OD as Referring Physician (Optometry)  Indicate any recent Medical Services you may have received from other than [facility] providers in the past year (date may be approximate).     Assessment:   This is a routine wellness examination for Zayra.  Hearing/Vision screen No results found.  Dietary issues and exercise activities discussed:     Goals Addressed   None    Depression Screen    09/03/2022    4:07 PM 08/30/2021    9:05 AM 08/24/2020    9:46 AM 08/23/2019   10:24 AM 08/18/2018    9:14 AM 08/12/2017   10:18 AM 02/11/2017    9:46 AM  PHQ 2/9 Scores  PHQ - 2 Score 0 0 0 0 0 0 0    Fall Risk    09/03/2022    3:59 PM 08/30/2021    9:04 AM 08/24/2020    9:45 AM 08/23/2019   10:24 AM 08/18/2018    9:14 AM  Fall Risk   Falls in the past year? 0 0 0 0 0  Number falls in past yr: 0 0 0 0   Injury with Fall? 0 0 0 0   Risk for fall due to : No Fall Risks No Fall Risks     Follow up Falls evaluation completed Falls evaluation completed Falls prevention discussed Education provided;Falls prevention discussed     MEDICARE RISK AT HOME:  Medicare Risk at Home - 09/03/22 1600     Any stairs in or around the home? Yes    If so, are there any without handrails? No  Home free of loose throw rugs in walkways, pet beds, electrical cords, etc? Yes    Adequate lighting in your home to reduce risk of falls?  Yes    Life alert? No    Use of a cane, walker or w/c? No    Grab bars in the bathroom? No    Shower chair or bench in shower? Yes    Elevated toilet seat or a handicapped toilet? Yes   comfort height            TIMED UP AND GO:  Was the test performed?  No    Cognitive Function:    08/12/2017   10:18 AM  MMSE - Mini Mental State Exam  Orientation to time 5  Orientation to Place 5  Registration 3  Attention/ Calculation 5  Recall 1  Language- name 2 objects 2  Language- repeat 1  Language- follow 3 step command 3  Language- read & follow direction 1  Write a sentence 1  Copy design 1  Total score 28        09/03/2022    4:08 PM 08/30/2021    9:09 AM  6CIT Screen  What Year? 0 points 0 points  What month? 0 points 0 points  What time? 0 points 0 points  Count back from 20 0 points 0 points  Months in reverse 4 points 2 points  Repeat phrase 6 points 8 points  Total Score 10 points 10 points    Immunizations Immunization History  Administered Date(s) Administered   PFIZER(Purple Top)SARS-COV-2 Vaccination 05/17/2019, 06/07/2019, 10/06/2019   Tdap 09/15/2012    TDAP status: Up to date  Flu Vaccine status: Due, Education has been provided regarding the importance of this vaccine. Advised may receive this vaccine at local pharmacy or Health Dept. Aware to provide a copy of the vaccination record if obtained from local pharmacy or Health Dept. Verbalized acceptance and understanding.  Pneumococcal vaccine status: Due, Education has been provided regarding the importance of this vaccine. Advised may receive this vaccine at local pharmacy or Health Dept. Aware to provide a copy of the vaccination record if obtained from local pharmacy or Health Dept. Verbalized acceptance and understanding.  Covid-19 vaccine status: Information provided on how to obtain vaccines.   Qualifies for Shingles Vaccine? Yes   Zostavax completed No   Shingrix Completed?: No.    Education  has been provided regarding the importance of this vaccine. Patient has been advised to call insurance company to determine out of pocket expense if they have not yet received this vaccine. Advised may also receive vaccine at local pharmacy or Health Dept. Verbalized acceptance and understanding.  Screening Tests Health Maintenance  Topic Date Due   Zoster Vaccines- Shingrix (1 of 2) Never done   PAP SMEAR-Modifier  12/24/2020   COVID-19 Vaccine (4 - 2023-24 season) 09/28/2021   Medicare Annual Wellness (AWV)  08/31/2022   INFLUENZA VACCINE  08/29/2022   DTaP/Tdap/Td (2 - Td or Tdap) 09/16/2022   MAMMOGRAM  09/07/2023   Colonoscopy  10/12/2026   Hepatitis C Screening  Completed   HIV Screening  Completed   HPV VACCINES  Aged Out    Health Maintenance  Health Maintenance Due  Topic Date Due   Zoster Vaccines- Shingrix (1 of 2) Never done   PAP SMEAR-Modifier  12/24/2020   COVID-19 Vaccine (4 - 2023-24 season) 09/28/2021   Medicare Annual Wellness (AWV)  08/31/2022   INFLUENZA VACCINE  08/29/2022    Colorectal cancer  screening: Type of screening: Colonoscopy. Completed 10/12/19. Repeat every 7 years  Mammogram status: Completed 09/06/21. Repeat every year  Bone Density status: due at age 17  Lung Cancer Screening: (Low Dose CT Chest recommended if Age 38-80 years, 20 pack-year currently smoking OR have quit w/in 15years.) does not qualify.   Additional Screening:  Hepatitis C Screening: does qualify; Completed 02/27/11  Vision Screening: Recommended annual ophthalmology exams for early detection of glaucoma and other disorders of the eye. Is the patient up to date with their annual eye exam?  Yes  Who is the provider or what is the name of the office in which the patient attends annual eye exams? [facility] If pt is not established with a provider, would they like to be referred to a provider to establish care? No .   Dental Screening: Recommended annual dental exams for  proper oral hygiene  Diabetic Foot Exam: N/a  Community Resource Referral / Chronic Care Management: CRR required this visit?  No   CCM required this visit?  No     Plan:     I have personally reviewed and noted the following in the patient's chart:   Medical and social history Use of alcohol, tobacco or illicit drugs  Current medications and supplements including opioid prescriptions. Patient is not currently taking opioid prescriptions. Functional ability and status Nutritional status Physical activity Advanced directives List of other physicians Hospitalizations, surgeries, and ER visits in previous 12 months Vitals Screenings to include cognitive, depression, and falls Referrals and appointments  In addition, I have reviewed and discussed with patient certain preventive protocols, quality metrics, and best practice recommendations. A written personalized care plan for preventive services as well as general preventive health recommendations were provided to patient.     Donne Anon, CMA   09/03/2022   After Visit Summary: (MyChart) Due to this being a telephonic visit, the after visit summary with patients personalized plan was offered to patient via MyChart   Nurse Notes: None

## 2022-09-12 ENCOUNTER — Ambulatory Visit
Admission: RE | Admit: 2022-09-12 | Discharge: 2022-09-12 | Disposition: A | Discharge status: Home / Self Care | Payer: Medicare (Managed Care) | Source: Ambulatory Visit | Attending: Family Medicine | Admitting: Family Medicine

## 2022-09-12 DIAGNOSIS — Z1231 Encounter for screening mammogram for malignant neoplasm of breast: Secondary | ICD-10-CM

## 2022-09-13 ENCOUNTER — Other Ambulatory Visit: Payer: Self-pay | Admitting: Cardiovascular Disease

## 2022-09-23 ENCOUNTER — Ambulatory Visit (HOSPITAL_COMMUNITY)
Admission: RE | Admit: 2022-09-23 | Discharge: 2022-09-23 | Disposition: A | Discharge status: Home / Self Care | Payer: Medicare (Managed Care) | Source: Ambulatory Visit | Attending: Rheumatology | Admitting: Rheumatology

## 2022-09-23 DIAGNOSIS — G6189 Other inflammatory polyneuropathies: Secondary | ICD-10-CM | POA: Diagnosis present

## 2022-09-23 DIAGNOSIS — M3219 Other organ or system involvement in systemic lupus erythematosus: Secondary | ICD-10-CM | POA: Diagnosis not present

## 2022-09-23 MED ORDER — ACETAMINOPHEN 325 MG PO TABS
ORAL_TABLET | ORAL | Status: AC
  Filled 2022-09-23: qty 2

## 2022-09-23 MED ORDER — ACETAMINOPHEN 325 MG PO TABS
650.0000 mg | ORAL_TABLET | ORAL | Status: DC
  Administered 2022-09-23: 650 mg via ORAL

## 2022-09-23 MED ORDER — DIPHENHYDRAMINE HCL 25 MG PO CAPS
ORAL_CAPSULE | ORAL | Status: AC
  Filled 2022-09-23: qty 1

## 2022-09-23 MED ORDER — DIPHENHYDRAMINE HCL 25 MG PO CAPS
25.0000 mg | ORAL_CAPSULE | ORAL | Status: DC
  Administered 2022-09-23: 25 mg via ORAL

## 2022-09-23 MED ORDER — IMMUNE GLOBULIN (HUMAN) 10 GM/100ML IV SOLN
70.0000 g | INTRAVENOUS | Status: DC
  Administered 2022-09-23: 70 g via INTRAVENOUS
  Filled 2022-09-23: qty 700

## 2022-09-24 ENCOUNTER — Encounter: Payer: Self-pay | Admitting: *Deleted

## 2022-10-22 ENCOUNTER — Other Ambulatory Visit: Payer: Self-pay | Admitting: Cardiovascular Disease

## 2022-10-22 ENCOUNTER — Other Ambulatory Visit: Payer: Self-pay | Admitting: Family Medicine

## 2022-10-22 DIAGNOSIS — I10 Essential (primary) hypertension: Secondary | ICD-10-CM

## 2022-11-04 ENCOUNTER — Encounter: Payer: Self-pay | Admitting: Family Medicine

## 2022-11-04 ENCOUNTER — Ambulatory Visit (INDEPENDENT_AMBULATORY_CARE_PROVIDER_SITE_OTHER): Payer: Medicare PPO | Admitting: Family Medicine

## 2022-11-04 VITALS — BP 110/70 | HR 81 | Temp 98.3°F | Resp 18 | Ht 68.0 in | Wt 159.4 lb

## 2022-11-04 DIAGNOSIS — M329 Systemic lupus erythematosus, unspecified: Secondary | ICD-10-CM

## 2022-11-04 DIAGNOSIS — I10 Essential (primary) hypertension: Secondary | ICD-10-CM

## 2022-11-04 DIAGNOSIS — E785 Hyperlipidemia, unspecified: Secondary | ICD-10-CM

## 2022-11-04 LAB — CBC WITH DIFFERENTIAL/PLATELET
Basophils Absolute: 0 10*3/uL (ref 0.0–0.1)
Basophils Relative: 0.8 % (ref 0.0–3.0)
Eosinophils Absolute: 0.1 10*3/uL (ref 0.0–0.7)
Eosinophils Relative: 2.5 % (ref 0.0–5.0)
HCT: 43.5 % (ref 36.0–46.0)
Hemoglobin: 13.9 g/dL (ref 12.0–15.0)
Lymphocytes Relative: 32.1 % (ref 12.0–46.0)
Lymphs Abs: 1.1 10*3/uL (ref 0.7–4.0)
MCHC: 31.9 g/dL (ref 30.0–36.0)
MCV: 90.6 fL (ref 78.0–100.0)
Monocytes Absolute: 0.4 10*3/uL (ref 0.1–1.0)
Monocytes Relative: 12.5 % — ABNORMAL HIGH (ref 3.0–12.0)
Neutro Abs: 1.8 10*3/uL (ref 1.4–7.7)
Neutrophils Relative %: 52.1 % (ref 43.0–77.0)
Platelets: 185 10*3/uL (ref 150.0–400.0)
RBC: 4.8 Mil/uL (ref 3.87–5.11)
RDW: 13.7 % (ref 11.5–15.5)
WBC: 3.4 10*3/uL — ABNORMAL LOW (ref 4.0–10.5)

## 2022-11-04 LAB — COMPREHENSIVE METABOLIC PANEL
ALT: 29 U/L (ref 0–35)
AST: 29 U/L (ref 0–37)
Albumin: 4 g/dL (ref 3.5–5.2)
Alkaline Phosphatase: 51 U/L (ref 39–117)
BUN: 14 mg/dL (ref 6–23)
CO2: 27 meq/L (ref 19–32)
Calcium: 9.4 mg/dL (ref 8.4–10.5)
Chloride: 107 meq/L (ref 96–112)
Creatinine, Ser: 0.72 mg/dL (ref 0.40–1.20)
GFR: 90.27 mL/min (ref >60.00)
Glucose, Bld: 88 mg/dL (ref 70–99)
Potassium: 4.6 meq/L (ref 3.5–5.1)
Sodium: 141 meq/L (ref 135–145)
Total Bilirubin: 0.3 mg/dL (ref 0.2–1.2)
Total Protein: 6.8 g/dL (ref 6.0–8.3)

## 2022-11-04 LAB — LIPID PANEL
Cholesterol: 217 mg/dL — ABNORMAL HIGH (ref 0–200)
HDL: 73 mg/dL (ref >39.00)
LDL Cholesterol: 127 mg/dL — ABNORMAL HIGH (ref 0–99)
NonHDL: 144.24
Total CHOL/HDL Ratio: 3
Triglycerides: 86 mg/dL (ref 0.0–149.0)
VLDL: 17.2 mg/dL (ref 0.0–40.0)

## 2022-11-04 LAB — TSH: TSH: 0.86 u[IU]/mL (ref 0.35–5.50)

## 2022-11-04 NOTE — Progress Notes (Signed)
Established Patient Office Visit  Subjective   Patient ID: Samantha [name], female    DOB: 1960/07/18  Age: 62 y.o. MRN: [id number]  Chief Complaint  Patient presents with   Hypertension   Follow-up   Coronary Artery Disease    HPI Discussed the use of AI scribe software for clinical note transcription with the patient, who gave verbal consent to proceed.  History of Present Illness   The patient presents for a routine checkup, prompted by a letter from the clinic. She denies any new symptoms or concerns. She reports no chest pain and has not seen a cardiologist this year. She has a history of Guillain Barre syndrome and is unable to receive the flu shot. She is currently taking Lipitor, Plaquenil, Metoprolol, Cellcept, Nitroglycerin, Protonix, spironolactone, and a multivitamin. She reports no issues with medication refills.      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   Hypotension due to drugs 08/04/2020   Weight loss 08/04/2020   Lymphadenopathy 08/31/2018   Wasp sting 08/31/2018   Colitis 06/13/2018   Cyst of right kidney 06/13/2018   CAD (coronary artery disease) 06/09/2018   Abnormal chest CT 06/09/2018   Transaminitis 06/09/2018   Cardiogenic shock (HCC) 06/03/2018   Acute respiratory failure (HCC) 06/03/2018   Acute ST elevation myocardial infarction (STEMI) of inferior wall (HCC) 05/30/2018   Acute ST elevation myocardial infarction (STEMI) involving right coronary artery (HCC)    Chronic bilateral low back pain without sciatica 12/08/2017   Hyperlipidemia LDL goal <70 02/11/2017   Gastroesophageal reflux disease without esophagitis 02/11/2017   HTN (hypertension) 06/23/2015   Abdominal pain, epigastric 06/23/2015   Polypharmacy 06/30/2012   Achilles tendinitis 11/20/2011   Lupus (systemic lupus erythematosus) (HCC) 08/22/2011   Disseminated lupus  erythematosus (HCC) 08/21/2011   Disorder of peripheral nervous system 05/28/2011   Headache 03/09/2011   Back pain 03/09/2011   Hyponatremia 03/09/2011   Hypokalemia 03/09/2011   Past Medical History:  Diagnosis Date   Altered mental status    Backache, unspecified    Gastric ulcer, unspecified as acute or chronic, without mention of hemorrhage, perforation, or obstruction    Helicobacter pylori (H. pylori)    Hyperlipidemia    Lupus    STEMI (ST elevation myocardial infarction) (HCC)    PCI to p/d RCA   Tobacco abuse    Tobacco use disorder 11/22/2011   Unspecified essential hypertension    Past Surgical History:  Procedure Laterality Date   COLONOSCOPY  10/12/2019   2016   CORONARY/GRAFT ACUTE MI REVASCULARIZATION N/A 05/30/2018   Procedure: Coronary/Graft Acute MI Revascularization;  Surgeon: Kathleene Hazel, MD;  Location: [facility];  Service: Cardiovascular;  Laterality: N/A;   INTRAOPERATIVE TRANSESOPHAGEAL ECHOCARDIOGRAM N/A 05/30/2018   Procedure: Transesophageal Echocardiogram;  Surgeon: Delight Ovens, MD;  Location: [facility] OR;  Service: Vascular;  Laterality: N/A;   LEFT HEART CATH AND CORONARY ANGIOGRAPHY N/A 05/30/2018   Procedure: LEFT HEART CATH AND CORONARY ANGIOGRAPHY;  Surgeon: Kathleene Hazel, MD;  Location: [facility];  Service: Cardiovascular;  Laterality: N/A;   POLYPECTOMY     UPPER GASTROINTESTINAL ENDOSCOPY  2013   Social History   Tobacco Use   Smoking status: Every Day    Current packs/day: 0.25    Average packs/day: 0.3 packs/day for 40.0 years (10.0 ttl pk-yrs)    Types: Cigarettes   Smokeless tobacco:  Never  Vaping Use   Vaping status: Never Used  Substance Use Topics   Alcohol use: Not Currently    Comment: 1 year since last drink    Drug use: No   Social History   Socioeconomic History   Marital status: Single    Spouse name: Not on file   Number of children: Not on file   Years of education: Not on  file   Highest education level: Not on file  Occupational History   Occupation: permanant disability    Employer: [employer]  Tobacco Use   Smoking status: Every Day    Current packs/day: 0.25    Average packs/day: 0.3 packs/day for 40.0 years (10.0 ttl pk-yrs)    Types: Cigarettes   Smokeless tobacco: Never  Vaping Use   Vaping status: Never Used  Substance and Sexual Activity   Alcohol use: Not Currently    Comment: 1 year since last drink    Drug use: No   Sexual activity: Not Currently    Partners: Male  Other Topics Concern   Not on file  Social History Narrative   Exercise--- walking   Social Determinants of Health   Financial Resource Strain: Low Risk  (09/03/2022)   Overall Financial Resource Strain (CARDIA)    Difficulty of Paying Living Expenses: Not very hard  Food Insecurity: No Food Insecurity (09/03/2022)   Hunger Vital Sign    Worried About Running Out of Food in the Last Year: Never true    Ran Out of Food in the Last Year: Never true  Transportation Needs: No Transportation Needs (09/03/2022)   PRAPARE - Administrator, Civil Service (Medical): No    Lack of Transportation (Non-Medical): No  Physical Activity: Inactive (09/03/2022)   Exercise Vital Sign    Days of Exercise per Week: 0 days    Minutes of Exercise per Session: 0 min  Stress: No Stress Concern Present (09/03/2022)   [employer] of Occupational Health - Occupational Stress Questionnaire    Feeling of Stress : Not at all  Social Connections: Socially Isolated (09/03/2022)   Social Connection and Isolation Panel [NHANES]    Frequency of Communication with Friends and Family: More than three times a week    Frequency of Social Gatherings with Friends and Family: Once a week    Attends Religious Services: Never    Database administrator or Organizations: No    Attends Banker Meetings: Never    Marital Status: Never married  Intimate Partner Violence: Not At Risk  (09/03/2022)   Humiliation, Afraid, Rape, and Kick questionnaire    Fear of Current or Ex-Partner: No    Emotionally Abused: No    Physically Abused: No    Sexually Abused: No   Family Status  Relation Name Status   Sister  Alive   Mother  Alive   Father  Deceased at age 74   Neg Hx  (Not Specified)  No partnership data on file   Family History  Problem Relation Age of Onset   Diabetes Sister    Hypertension Mother    Dementia Mother    Colon cancer Neg Hx    Stomach cancer Neg Hx    Pancreatic cancer Neg Hx    Colon polyps Neg Hx    Rectal cancer Neg Hx     Review of Systems  Constitutional:  Negative for chills, fever and malaise/fatigue.  HENT:  Negative for congestion and hearing loss.  Eyes:  Negative for blurred vision and discharge.  Respiratory:  Negative for cough, sputum production and shortness of breath.   Cardiovascular:  Negative for chest pain, palpitations and leg swelling.  Gastrointestinal:  Negative for abdominal pain, blood in stool, constipation, diarrhea, heartburn, nausea and vomiting.  Genitourinary:  Negative for dysuria, frequency, hematuria and urgency.  Musculoskeletal:  Negative for back pain, falls and myalgias.  Skin:  Negative for rash.  Neurological:  Negative for dizziness, sensory change, loss of consciousness, weakness and headaches.  Endo/Heme/Allergies:  Negative for environmental allergies. Does not bruise/bleed easily.  Psychiatric/Behavioral:  Negative for depression and suicidal ideas. The patient is not nervous/anxious and does not have insomnia.       Objective:     BP 110/70 (BP Location: Left Arm, Patient Position: Sitting, Cuff Size: Normal)   Pulse 81   Temp 98.3 F (36.8 C) (Oral)   Resp 18   Ht 5\' 8"  (1.727 m)   Wt 159 lb 6.4 oz (72.3 kg)   LMP 04/11/2011   SpO2 98%   BMI 24.24 kg/m  BP Readings from Last 3 Encounters:  11/04/22 110/70  09/23/22 126/80  07/29/22 136/88   Wt Readings from Last 3 Encounters:   11/04/22 159 lb 6.4 oz (72.3 kg)  09/23/22 142 lb (64.4 kg)  07/29/22 142 lb (64.4 kg)   SpO2 Readings from Last 3 Encounters:  11/04/22 98%  09/23/22 100%  07/29/22 99%      Physical Exam Vitals and nursing note reviewed.  Constitutional:      General: She is not in acute distress.    Appearance: Normal appearance. She is well-developed.  HENT:     Head: Normocephalic and atraumatic.     Right Ear: Tympanic membrane, ear canal and external ear normal. There is no impacted cerumen.     Left Ear: Tympanic membrane, ear canal and external ear normal. There is no impacted cerumen.     Nose: Nose normal.     Mouth/Throat:     Mouth: Mucous membranes are moist.     Pharynx: Oropharynx is clear. No oropharyngeal exudate or posterior oropharyngeal erythema.  Eyes:     General: No scleral icterus.       Right eye: No discharge.        Left eye: No discharge.     Conjunctiva/sclera: Conjunctivae normal.     Pupils: Pupils are equal, round, and reactive to light.  Neck:     Thyroid: No thyromegaly or thyroid tenderness.     Vascular: No JVD.  Cardiovascular:     Rate and Rhythm: Normal rate and regular rhythm.     Heart sounds: Normal heart sounds. No murmur heard. Pulmonary:     Effort: Pulmonary effort is normal. No respiratory distress.     Breath sounds: Normal breath sounds.  Abdominal:     General: Bowel sounds are normal. There is no distension.     Palpations: Abdomen is soft. There is no mass.     Tenderness: There is no abdominal tenderness. There is no guarding or rebound.  Genitourinary:    Vagina: Normal.  Musculoskeletal:        General: Normal range of motion.     Cervical back: Normal range of motion and neck supple.     Right lower leg: No edema.     Left lower leg: No edema.  Lymphadenopathy:     Cervical: No cervical adenopathy.  Skin:    General: Skin is warm and  dry.     Findings: No erythema or rash.  Neurological:     Mental Status: She is alert  and oriented to person, place, and time.     Cranial Nerves: No cranial nerve deficit.     Deep Tendon Reflexes: Reflexes are normal and symmetric.  Psychiatric:        Mood and Affect: Mood normal.        Behavior: Behavior normal.        Thought Content: Thought content normal.        Judgment: Judgment normal.      No results found for any visits on 11/04/22.  Last CBC Lab Results  Component Value Date   WBC 3.9 (L) 01/14/2022   HGB 13.3 01/14/2022   HCT 40.8 01/14/2022   MCV 89.8 01/14/2022   MCH 27.7 07/22/2018   RDW 13.8 01/14/2022   PLT 205.0 01/14/2022   Last metabolic panel Lab Results  Component Value Date   GLUCOSE 86 01/14/2022   NA 140 01/14/2022   K 4.5 01/14/2022   CL 104 01/14/2022   CO2 28 01/14/2022   BUN 14 01/14/2022   CREATININE 0.67 01/14/2022   GFR 94.90 01/14/2022   CALCIUM 9.5 01/14/2022   PROT 6.6 01/14/2022   ALBUMIN 3.9 01/14/2022   LABGLOB 3.6 06/11/2018   AGRATIO 1.1 (L) 06/11/2018   BILITOT 0.4 01/14/2022   ALKPHOS 48 01/14/2022   AST 33 01/14/2022   ALT 38 (H) 01/14/2022   ANIONGAP 11 06/16/2018   Last lipids Lab Results  Component Value Date   CHOL 155 01/14/2022   HDL 71.80 01/14/2022   LDLCALC 59 01/14/2022   LDLDIRECT 141.8 09/15/2012   TRIG 119.0 01/14/2022   CHOLHDL 2 01/14/2022   Last hemoglobin A1c Lab Results  Component Value Date   HGBA1C 5.6 03/13/2011   Last thyroid functions Lab Results  Component Value Date   TSH 0.71 01/14/2022   T4TOTAL 6.0 08/04/2020   Last vitamin D No results found for: "25OHVITD2", "25OHVITD3", "VD25OH" Last vitamin B12 and Folate Lab Results  Component Value Date   VITAMINB12 600 03/12/2011      The ASCVD Risk score (Arnett DK, et al., 2019) failed to calculate for the following reasons:   The patient has a prior MI or stroke diagnosis    Assessment & Plan:   Problem List Items Addressed This Visit       Unprioritized   Disseminated lupus erythematosus (HCC)    Hyperlipidemia LDL goal <70    Tolerating statin, encouraged heart healthy diet, avoid trans fats, minimize simple carbs and saturated fats. Increase exercise as tolerated       Relevant Orders   Comprehensive metabolic panel   Lipid panel   HTN (hypertension) - Primary    Well controlled, no changes to meds. Encouraged heart healthy diet such as the DASH diet and exercise as tolerated.        Relevant Orders   CBC with Differential/Platelet   Comprehensive metabolic panel   Lipid panel   TSH  Assessment and Plan    Hypertension Well controlled, no chest pain reported. -Continue current medication regimen including Metoprolol.  Guillain Barre Syndrome No new symptoms reported. -Continue current medication regimen including Plaquenil and Cellcept (mycophenolate).  Hyperlipidemia No new symptoms reported. -Continue current medication regimen including Lipitor.  Gastroesophageal Reflux Disease No new symptoms reported. -Continue current medication regimen including Protonix.  General Health Maintenance -Continue current medication regimen including spironolactone, nitroglycerin, and Centrum Silver. -Flu  vaccine contraindicated due to history of Guillain Barre Syndrome. -Order routine labs to monitor medication efficacy and safety.        Return in about 6 months (around 05/05/2023) for annual exam, fasting.    Donato Schultz, DO

## 2022-11-04 NOTE — Assessment & Plan Note (Signed)
Well controlled, no changes to meds. Encouraged heart healthy diet such as the DASH diet and exercise as tolerated.  °

## 2022-11-04 NOTE — Assessment & Plan Note (Signed)
Tolerating statin, encouraged heart healthy diet, avoid trans fats, minimize simple carbs and saturated fats. Increase exercise as tolerated 

## 2022-11-04 NOTE — Patient Instructions (Signed)

## 2022-11-04 NOTE — Assessment & Plan Note (Signed)
Per rheum 

## 2022-11-20 ENCOUNTER — Telehealth: Payer: Self-pay | Admitting: Cardiovascular Disease

## 2022-11-20 MED ORDER — SPIRONOLACTONE 25 MG PO TABS: 25.0000 mg | ORAL_TABLET | Freq: Every day | ORAL | 0 refills | Status: DC

## 2022-11-20 MED ORDER — ATORVASTATIN CALCIUM 80 MG PO TABS: ORAL_TABLET | ORAL | 0 refills | Status: DC

## 2022-11-20 NOTE — Telephone Encounter (Signed)
Medication has been refilled and sent to patient's preferred pharmacy

## 2022-11-20 NOTE — Telephone Encounter (Signed)
*  STAT* If patient is at the pharmacy, call can be transferred to refill team.   1. Which medications need to be refilled? (please list name of each medication and dose if known)   atorvastatin (LIPITOR) 80 MG tablet  spironolactone (ALDACTONE) 25 MG tablet   2. Which pharmacy/location (including street and city if local pharmacy) is medication to be sent to?  [id number] - [place], [place] - [address]      3. Do they need a 30 day or 90 day supply? 90 day    Pt is out of medication and has appt scheduled for 02/07/2023

## 2022-11-21 NOTE — Progress Notes (Signed)
Chief Complaint  Patient presents with   Follow-up    CAD   History of Present Illness: 62 yo female with history of lupus, CAD, HLD, HTN and tobacco abuse here today for cardiac follow up. She was admitted to [facility] May 2020 with an inferior STEMI treated with drug eluting stents in the proximal and distal RCA. Her initial presentation was initially worrisome for an aortic dissection based on her CTA but TEE did not show an aortic dissection. Chest CTA August 2020 with resolution of aortic fluid/soft tissue abnormality. No evidence of aortic dissection. Echo May 2020 with normal LV size and function.   She is here today for follow up. The patient denies any chest pain, dyspnea, palpitations, lower extremity edema, orthopnea, PND, dizziness, near syncope or syncope.    Primary Care Physician: Zola Button, Grayling Congress, DO  Past Medical History:  Diagnosis Date   Altered mental status    Backache, unspecified    Gastric ulcer, unspecified as acute or chronic, without mention of hemorrhage, perforation, or obstruction    Helicobacter pylori (H. pylori)    Hyperlipidemia    Lupus    STEMI (ST elevation myocardial infarction) (HCC)    PCI to p/d RCA   Tobacco abuse    Tobacco use disorder 11/22/2011   Unspecified essential hypertension     Past Surgical History:  Procedure Laterality Date   COLONOSCOPY  10/12/2019   2016   CORONARY/GRAFT ACUTE MI REVASCULARIZATION N/A 05/30/2018   Procedure: Coronary/Graft Acute MI Revascularization;  Surgeon: Kathleene Hazel, MD;  Location: [facility];  Service: Cardiovascular;  Laterality: N/A;   INTRAOPERATIVE TRANSESOPHAGEAL ECHOCARDIOGRAM N/A 05/30/2018   Procedure: Transesophageal Echocardiogram;  Surgeon: Delight Ovens, MD;  Location: [facility];  Service: Vascular;  Laterality: N/A;   LEFT HEART CATH AND CORONARY ANGIOGRAPHY N/A 05/30/2018   Procedure: LEFT HEART CATH AND CORONARY ANGIOGRAPHY;  Surgeon: Kathleene Hazel, MD;   Location: [facility];  Service: Cardiovascular;  Laterality: N/A;   POLYPECTOMY     UPPER GASTROINTESTINAL ENDOSCOPY  2013    Current Outpatient Medications  Medication Sig Dispense Refill   acetaminophen (TYLENOL) 500 MG tablet Take 500 mg by mouth every 6 (six) hours as needed for moderate pain or headache.     aspirin EC 81 MG tablet Take 1 tablet (81 mg total) by mouth daily.     atorvastatin (LIPITOR) 80 MG tablet TAKE 1 TABLET BY MOUTH EVERY DAY AT 6 PM 30 tablet 0   cyclobenzaprine (FLEXERIL) 10 MG tablet TAKE 1 TABLET(10 MG) BY MOUTH THREE TIMES DAILY AS NEEDED (Patient taking differently: Take 10 mg by mouth 3 (three) times daily as needed for muscle spasms.) 30 tablet 0   hydroxychloroquine (PLAQUENIL) 200 MG tablet Take 300 mg by mouth daily. Take 1 tab today and t tabs the next day (tab hard to break)     loperamide (IMODIUM A-D) 2 MG tablet Take 1 tablet (2 mg total) by mouth 4 (four) times daily as needed for diarrhea or loose stools. 30 tablet 0   metoprolol succinate (TOPROL-XL) 100 MG 24 hr tablet TAKE 1 TABLET (100 MG TOTAL) BY MOUTH DAILY. IMMEDIATELY WITH OR FOLLOWING A MEAL. 90 tablet 0   Multiple Vitamin (MULTIVITAMIN) tablet Take 1 tablet by mouth daily.     mycophenolate (CELLCEPT) 500 MG tablet Take 1,000 mg by mouth 2 (two) times daily.     nitroGLYCERIN (NITROSTAT) 0.4 MG SL tablet PLACE 1 TABLET (  0.4 MG TOTAL) UNDER THE TONGUE EVERY 5 (FIVE) MINUTES AS NEEDED FOR CHEST PAIN. 25 tablet 0   pantoprazole (PROTONIX) 20 MG tablet TAKE 1 TABLET BY MOUTH EVERY DAY 30 tablet 2   spironolactone (ALDACTONE) 25 MG tablet Take 1 tablet (25 mg total) by mouth daily. PATIENT MUST KEEP APPOINTMENT ON JAN FOR ADDITIONAL REFILLS 30 tablet 0   No current facility-administered medications for this visit.    Allergies  Allergen Reactions   Haemophilus Influenzae Vaccines     Hx GBS   Tramadol Nausea Only    Social History   Socioeconomic History   Marital status:  Single    Spouse name: Not on file   Number of children: Not on file   Years of education: Not on file   Highest education level: Not on file  Occupational History   Occupation: permanant disability    Employer: [employer]  Tobacco Use   Smoking status: Every Day    Current packs/day: 0.25    Average packs/day: 0.3 packs/day for 40.0 years (10.0 ttl pk-yrs)    Types: Cigarettes   Smokeless tobacco: Never  Vaping Use   Vaping status: Never Used  Substance and Sexual Activity   Alcohol use: Not Currently    Comment: 1 year since last drink    Drug use: No   Sexual activity: Not Currently    Partners: Male  Other Topics Concern   Not on file  Social History Narrative   Exercise--- walking   Social Determinants of Health   Financial Resource Strain: Low Risk  (09/03/2022)   Overall Financial Resource Strain (CARDIA)    Difficulty of Paying Living Expenses: Not very hard  Food Insecurity: No Food Insecurity (09/03/2022)   Hunger Vital Sign    Worried About Running Out of Food in the Last Year: Never true    Ran Out of Food in the Last Year: Never true  Transportation Needs: No Transportation Needs (09/03/2022)   PRAPARE - Administrator, Civil Service (Medical): No    Lack of Transportation (Non-Medical): No  Physical Activity: Inactive (09/03/2022)   Exercise Vital Sign    Days of Exercise per Week: 0 days    Minutes of Exercise per Session: 0 min  Stress: No Stress Concern Present (09/03/2022)   [employer] of Occupational Health - Occupational Stress Questionnaire    Feeling of Stress : Not at all  Social Connections: Socially Isolated (09/03/2022)   Social Connection and Isolation Panel [NHANES]    Frequency of Communication with Friends and Family: More than three times a week    Frequency of Social Gatherings with Friends and Family: Once a week    Attends Religious Services: Never    Database administrator or Organizations: No    Attends Tax inspector Meetings: Never    Marital Status: Never married  Intimate Partner Violence: Not At Risk (09/03/2022)   Humiliation, Afraid, Rape, and Kick questionnaire    Fear of Current or Ex-Partner: No    Emotionally Abused: No    Physically Abused: No    Sexually Abused: No    Family History  Problem Relation Age of Onset   Diabetes Sister    Hypertension Mother    Dementia Mother    Colon cancer Neg Hx    Stomach cancer Neg Hx    Pancreatic cancer Neg Hx    Colon polyps Neg Hx    Rectal cancer Neg Hx  Review of Systems:  As stated in the HPI and otherwise negative.   BP 114/76   Pulse 74   Ht 5\' 8"  (1.727 m)   Wt 71.3 kg   LMP 04/11/2011   SpO2 97%   BMI 23.90 kg/m   Physical Examination: General: Well developed, well nourished, NAD  HEENT: OP clear, mucus membranes moist  SKIN: warm, dry. No rashes. Neuro: No focal deficits  Musculoskeletal: Muscle strength 5/5 all ext  Psychiatric: Mood and affect normal  Neck: No JVD, no carotid bruits, no thyromegaly, no lymphadenopathy.  Lungs:Clear bilaterally, no wheezes, rhonci, crackles Cardiovascular: Regular rate and rhythm. No murmurs, gallops or rubs. Abdomen:Soft. Bowel sounds present. Non-tender.  Extremities: No lower extremity edema. Pulses are 2 + in the bilateral DP/PT.  Echo 05/31/18:  1. The left ventricle has low normal systolic function, with an ejection fraction of 50-55%. The cavity size was normal. Left ventricular diastolic Doppler parameters are consistent with impaired relaxation.  2. LVEF is approximately 50 to 55% with severe hypokinesis of the inferor/inferoseptal walls.  3. The right ventricle has mildly reduced systolic function. The cavity was mildly enlarged. There is no increase in right ventricular wall thickness.  4. The inferior vena cava was dilated in size with <50% respiratory variability.  5. The interatrial septum was not assessed.  EKG:  EKG is ordered today. The ekg ordered today  demonstrates  EKG Interpretation Date/Time:  Friday November 22 2022 11:42:54 EDT Ventricular Rate:  70 PR Interval:  168 QRS Duration:  84 QT Interval:  402 QTC Calculation: 434 R Axis:   62  Text Interpretation: Normal sinus rhythm Possible Left atrial enlargement Left ventricular hypertrophy ( Sokolow-Lyon , Romhilt-Estes ) ST & T wave abnormality, consider inferior ischemia Confirmed by Verne Carrow [phone]) on 11/22/2022 11:55:34 AM    Recent Labs: 11/04/2022: ALT 29; BUN 14; Creatinine, Ser 0.72; Hemoglobin 13.9; Platelets 185.0; Potassium 4.6; Sodium 141; TSH 0.86   Lipid Panel    Component Value Date/Time   CHOL 217 (H) 11/04/2022 0954   CHOL 147 12/19/2020 0911   TRIG 86.0 11/04/2022 0954   HDL 73.00 11/04/2022 0954   HDL 65 12/19/2020 0911   CHOLHDL 3 11/04/2022 0954   VLDL 17.2 11/04/2022 0954   LDLCALC 127 (H) 11/04/2022 0954   LDLCALC 64 12/19/2020 0911   LDLDIRECT 141.8 09/15/2012 1059     Wt Readings from Last 3 Encounters:  11/22/22 71.3 kg  11/04/22 72.3 kg  09/23/22 64.4 kg    Assessment and Plan:   1. CAD without angina: No chest pain. Continue ASA, statin, beta blocker and aldactone.      2. HTN: BP is controlled. No changes today  3. HLD: LDL at goal in 2023 but she stopped her Lipitor. LDL 127 two weeks ago. She is now back on Lipitor.   4. Tobacco abuse: Smoking cessation advised.    Labs/ tests ordered today include:   Orders Placed This Encounter  Procedures   EKG 12-Lead   Disposition:   F/U with me in 12 months  Signed, Verne Carrow, MD 11/22/2022 12:21 PM    [facility] [address] [place], [place], [place]  [address] Phone: [phone]; Fax: [phone]

## 2022-11-22 ENCOUNTER — Encounter: Payer: Self-pay | Admitting: Cardiovascular Disease

## 2022-11-22 ENCOUNTER — Ambulatory Visit: Payer: Medicare PPO | Attending: Cardiovascular Disease | Admitting: Cardiovascular Disease

## 2022-11-22 VITALS — BP 114/76 | HR 74 | Ht 68.0 in | Wt 157.2 lb

## 2022-11-22 DIAGNOSIS — E785 Hyperlipidemia, unspecified: Secondary | ICD-10-CM

## 2022-11-22 DIAGNOSIS — I2510 Atherosclerotic heart disease of native coronary artery without angina pectoris: Secondary | ICD-10-CM | POA: Diagnosis not present

## 2022-11-22 DIAGNOSIS — I10 Essential (primary) hypertension: Secondary | ICD-10-CM | POA: Diagnosis not present

## 2022-11-22 NOTE — Patient Instructions (Signed)

## 2022-11-25 ENCOUNTER — Ambulatory Visit (HOSPITAL_COMMUNITY)
Admission: RE | Admit: 2022-11-25 | Discharge: 2022-11-25 | Disposition: A | Discharge status: Home / Self Care | Payer: Medicare PPO | Source: Ambulatory Visit | Attending: Rheumatology | Admitting: Rheumatology

## 2022-11-25 DIAGNOSIS — G6189 Other inflammatory polyneuropathies: Secondary | ICD-10-CM | POA: Diagnosis not present

## 2022-11-25 DIAGNOSIS — M329 Systemic lupus erythematosus, unspecified: Secondary | ICD-10-CM | POA: Insufficient documentation

## 2022-11-25 DIAGNOSIS — G629 Polyneuropathy, unspecified: Secondary | ICD-10-CM | POA: Insufficient documentation

## 2022-11-25 DIAGNOSIS — M3219 Other organ or system involvement in systemic lupus erythematosus: Secondary | ICD-10-CM | POA: Insufficient documentation

## 2022-11-25 MED ORDER — ACETAMINOPHEN 325 MG PO TABS: 650.0000 mg | ORAL_TABLET | ORAL | Status: DC

## 2022-11-25 MED ORDER — IMMUNE GLOBULIN (HUMAN) 10 GM/100ML IV SOLN
70.0000 g | INTRAVENOUS | Status: DC
  Administered 2022-11-25: 70 g via INTRAVENOUS
  Filled 2022-11-25: qty 700

## 2022-11-25 MED ORDER — ACETAMINOPHEN 325 MG PO TABS
ORAL_TABLET | ORAL | Status: AC
  Administered 2022-11-25: 650 mg via ORAL
  Filled 2022-11-25: qty 2

## 2022-11-25 MED ORDER — DIPHENHYDRAMINE HCL 25 MG PO CAPS
ORAL_CAPSULE | ORAL | Status: AC
  Administered 2022-11-25: 25 mg via ORAL
  Filled 2022-11-25: qty 1

## 2022-11-25 MED ORDER — DIPHENHYDRAMINE HCL 25 MG PO CAPS: 25.0000 mg | ORAL_CAPSULE | ORAL | Status: DC

## 2022-12-13 ENCOUNTER — Other Ambulatory Visit: Payer: Self-pay | Admitting: Cardiovascular Disease

## 2022-12-16 ENCOUNTER — Other Ambulatory Visit: Payer: Self-pay | Admitting: Family Medicine

## 2022-12-16 ENCOUNTER — Other Ambulatory Visit: Payer: Self-pay | Admitting: Cardiovascular Disease

## 2022-12-16 DIAGNOSIS — I10 Essential (primary) hypertension: Secondary | ICD-10-CM

## 2023-01-01 MED FILL — Immune Globulin (Human) IV Soln 10 GM/100ML: INTRAVENOUS | Qty: 100 | Status: AC

## 2023-01-27 ENCOUNTER — Ambulatory Visit (HOSPITAL_COMMUNITY)
Admission: RE | Admit: 2023-01-27 | Discharge: 2023-01-27 | Disposition: A | Discharge status: Home / Self Care | Payer: Medicare PPO | Source: Ambulatory Visit | Attending: Rheumatology | Admitting: Rheumatology

## 2023-01-27 DIAGNOSIS — M329 Systemic lupus erythematosus, unspecified: Secondary | ICD-10-CM | POA: Insufficient documentation

## 2023-01-27 DIAGNOSIS — G629 Polyneuropathy, unspecified: Secondary | ICD-10-CM | POA: Insufficient documentation

## 2023-01-27 DIAGNOSIS — M3219 Other organ or system involvement in systemic lupus erythematosus: Secondary | ICD-10-CM | POA: Insufficient documentation

## 2023-01-27 DIAGNOSIS — L930 Discoid lupus erythematosus: Secondary | ICD-10-CM | POA: Diagnosis present

## 2023-01-27 MED ORDER — ACETAMINOPHEN 325 MG PO TABS
650.0000 mg | ORAL_TABLET | ORAL | Status: DC
  Administered 2023-01-27: 650 mg via ORAL

## 2023-01-27 MED ORDER — DIPHENHYDRAMINE HCL 25 MG PO CAPS
25.0000 mg | ORAL_CAPSULE | ORAL | Status: DC
  Administered 2023-01-27: 25 mg via ORAL

## 2023-01-27 MED ORDER — DIPHENHYDRAMINE HCL 25 MG PO CAPS
ORAL_CAPSULE | ORAL | Status: AC
  Filled 2023-01-27: qty 1

## 2023-01-27 MED ORDER — IMMUNE GLOBULIN (HUMAN) 10 GM/100ML IV SOLN
70.0000 g | INTRAVENOUS | Status: DC
  Administered 2023-01-27: 70 g via INTRAVENOUS
  Filled 2023-01-27: qty 700

## 2023-01-27 MED ORDER — ACETAMINOPHEN 325 MG PO TABS
ORAL_TABLET | ORAL | Status: AC
  Filled 2023-01-27: qty 2

## 2023-02-07 ENCOUNTER — Ambulatory Visit: Payer: Medicare PPO | Admitting: Physician Assistant

## 2023-02-24 ENCOUNTER — Other Ambulatory Visit: Payer: Self-pay | Admitting: Family Medicine

## 2023-02-24 DIAGNOSIS — I10 Essential (primary) hypertension: Secondary | ICD-10-CM

## 2023-03-27 ENCOUNTER — Other Ambulatory Visit (HOSPITAL_COMMUNITY): Payer: Self-pay | Admitting: *Deleted

## 2023-03-28 DIAGNOSIS — F17200 Nicotine dependence, unspecified, uncomplicated: Secondary | ICD-10-CM | POA: Diagnosis not present

## 2023-03-28 DIAGNOSIS — Z79899 Other long term (current) drug therapy: Secondary | ICD-10-CM | POA: Diagnosis not present

## 2023-03-28 DIAGNOSIS — R7401 Elevation of levels of liver transaminase levels: Secondary | ICD-10-CM | POA: Diagnosis not present

## 2023-03-28 DIAGNOSIS — M3219 Other organ or system involvement in systemic lupus erythematosus: Secondary | ICD-10-CM | POA: Diagnosis not present

## 2023-03-31 ENCOUNTER — Ambulatory Visit (HOSPITAL_COMMUNITY)
Admission: RE | Admit: 2023-03-31 | Discharge: 2023-03-31 | Disposition: A | Discharge status: Home / Self Care | Payer: Medicare PPO | Source: Ambulatory Visit | Attending: Rheumatology | Admitting: Rheumatology

## 2023-03-31 DIAGNOSIS — G6289 Other specified polyneuropathies: Secondary | ICD-10-CM | POA: Insufficient documentation

## 2023-03-31 DIAGNOSIS — M329 Systemic lupus erythematosus, unspecified: Secondary | ICD-10-CM | POA: Insufficient documentation

## 2023-03-31 MED ORDER — IMMUNE GLOBULIN (HUMAN) 10 GM/100ML IV SOLN
70.0000 g | INTRAVENOUS | Status: DC
  Administered 2023-03-31: 70 g via INTRAVENOUS
  Filled 2023-03-31: qty 700

## 2023-03-31 MED ORDER — DIPHENHYDRAMINE HCL 25 MG PO CAPS
25.0000 mg | ORAL_CAPSULE | ORAL | Status: DC
  Administered 2023-03-31: 25 mg via ORAL

## 2023-03-31 MED ORDER — DIPHENHYDRAMINE HCL 25 MG PO CAPS
ORAL_CAPSULE | ORAL | Status: AC
  Filled 2023-03-31: qty 1

## 2023-03-31 MED ORDER — ACETAMINOPHEN 325 MG PO TABS
ORAL_TABLET | ORAL | Status: AC
  Filled 2023-03-31: qty 2

## 2023-03-31 MED ORDER — ACETAMINOPHEN 325 MG PO TABS
650.0000 mg | ORAL_TABLET | ORAL | Status: DC
  Administered 2023-03-31: 650 mg via ORAL

## 2023-05-01 ENCOUNTER — Encounter: Payer: Self-pay | Admitting: Acute Care

## 2023-05-30 ENCOUNTER — Other Ambulatory Visit (HOSPITAL_COMMUNITY): Payer: Self-pay

## 2023-06-02 ENCOUNTER — Ambulatory Visit (HOSPITAL_COMMUNITY)
Admission: RE | Admit: 2023-06-02 | Discharge: 2023-06-02 | Disposition: A | Discharge status: Home / Self Care | Payer: Medicare PPO | Source: Ambulatory Visit | Attending: Rheumatology | Admitting: Rheumatology

## 2023-06-02 DIAGNOSIS — M3219 Other organ or system involvement in systemic lupus erythematosus: Secondary | ICD-10-CM | POA: Diagnosis not present

## 2023-06-02 DIAGNOSIS — G6181 Chronic inflammatory demyelinating polyneuritis: Secondary | ICD-10-CM | POA: Diagnosis not present

## 2023-06-02 DIAGNOSIS — G629 Polyneuropathy, unspecified: Secondary | ICD-10-CM | POA: Insufficient documentation

## 2023-06-02 MED ORDER — DIPHENHYDRAMINE HCL 25 MG PO CAPS
ORAL_CAPSULE | ORAL | Status: AC
  Administered 2023-06-02: 25 mg via ORAL
  Filled 2023-06-02: qty 1

## 2023-06-02 MED ORDER — IMMUNE GLOBULIN (HUMAN) 10 GM/100ML IV SOLN
70.0000 g | INTRAVENOUS | Status: DC
  Administered 2023-06-02: 70 g via INTRAVENOUS
  Filled 2023-06-02: qty 700

## 2023-06-02 MED ORDER — DIPHENHYDRAMINE HCL 25 MG PO CAPS: 25.0000 mg | ORAL_CAPSULE | Freq: Once | ORAL | Status: AC

## 2023-06-02 MED ORDER — SODIUM CHLORIDE 0.9 % IV SOLN: INTRAVENOUS | Status: DC | PRN

## 2023-06-02 MED ORDER — ACETAMINOPHEN 325 MG PO TABS: 650.0000 mg | ORAL_TABLET | Freq: Once | ORAL | Status: AC

## 2023-06-02 MED ORDER — ACETAMINOPHEN 325 MG PO TABS
ORAL_TABLET | ORAL | Status: AC
  Administered 2023-06-02: 650 mg via ORAL
  Filled 2023-06-02: qty 2

## 2023-07-17 ENCOUNTER — Encounter: Payer: Self-pay | Admitting: Family Medicine

## 2023-07-24 ENCOUNTER — Telehealth: Payer: Self-pay | Admitting: Pharmacy Technician

## 2023-07-24 NOTE — Telephone Encounter (Signed)
 Auth Submission: APPROVED Site of care: Site of care: [facility] Payer: [employer] MEDICARE Medication & CPT/J Code(s) submitted: [employer]  (IVIG) J1459 Diagnosis Code:  Route of submission (phone, fax, portal):  Phone # Fax # Auth type: Buy/Bill HB Units/visits requested: 77G q 8 weeks Reference number: [id number] Approval from: 11/25/22 to 01/28/24    Dagoberto Armour, CPhT

## 2023-07-28 ENCOUNTER — Ambulatory Visit (HOSPITAL_COMMUNITY)
Admission: RE | Admit: 2023-07-28 | Discharge: 2023-07-28 | Disposition: A | Discharge status: Home / Self Care | Source: Ambulatory Visit | Attending: Rheumatology | Admitting: Rheumatology

## 2023-07-28 DIAGNOSIS — G629 Polyneuropathy, unspecified: Secondary | ICD-10-CM | POA: Insufficient documentation

## 2023-07-28 DIAGNOSIS — M3219 Other organ or system involvement in systemic lupus erythematosus: Secondary | ICD-10-CM | POA: Insufficient documentation

## 2023-07-28 DIAGNOSIS — Z7969 Long term (current) use of other immunomodulators and immunosuppressants: Secondary | ICD-10-CM | POA: Diagnosis not present

## 2023-07-28 DIAGNOSIS — M329 Systemic lupus erythematosus, unspecified: Secondary | ICD-10-CM | POA: Insufficient documentation

## 2023-07-28 MED ORDER — ACETAMINOPHEN 325 MG PO TABS
ORAL_TABLET | ORAL | Status: AC
  Filled 2023-07-28: qty 2

## 2023-07-28 MED ORDER — SODIUM CHLORIDE 0.9 % IV SOLN: INTRAVENOUS | Status: DC | PRN

## 2023-07-28 MED ORDER — DIPHENHYDRAMINE HCL 25 MG PO CAPS
25.0000 mg | ORAL_CAPSULE | Freq: Once | ORAL | Status: DC
  Administered 2023-07-28: 25 mg via ORAL

## 2023-07-28 MED ORDER — DIPHENHYDRAMINE HCL 25 MG PO CAPS
ORAL_CAPSULE | ORAL | Status: AC
Start: 2023-07-28 — End: 2023-07-28
  Filled 2023-07-28: qty 1

## 2023-07-28 MED ORDER — ACETAMINOPHEN 325 MG PO TABS
650.0000 mg | ORAL_TABLET | Freq: Once | ORAL | Status: DC
  Administered 2023-07-28: 650 mg via ORAL

## 2023-07-28 MED ORDER — IMMUNE GLOBULIN (HUMAN) 10 GM/100ML IV SOLN
70.0000 g | INTRAVENOUS | Status: DC
  Administered 2023-07-28: 70 g via INTRAVENOUS
  Filled 2023-07-28: qty 700

## 2023-09-02 ENCOUNTER — Ambulatory Visit (INDEPENDENT_AMBULATORY_CARE_PROVIDER_SITE_OTHER)

## 2023-09-02 VITALS — Ht 68.0 in | Wt 157.0 lb

## 2023-09-02 DIAGNOSIS — Z0000 Encounter for general adult medical examination without abnormal findings: Secondary | ICD-10-CM

## 2023-09-02 NOTE — Patient Instructions (Addendum)
 Samantha [name] , Thank you for taking time out of your busy schedule to complete your Annual Wellness Visit with me. I enjoyed our conversation and look forward to speaking with you again next year. I, as well as your care team,  appreciate your ongoing commitment to your health goals. Please review the following plan we discussed and let me know if I can assist you in the future. Your Game plan/ To Do List    Referrals: If you haven't heard from the office you've been referred to, please reach out to them at the phone provided.   Follow up Visits: We will see or speak with you next year for your Next Medicare AWV with our clinical staff 09/07/24 @ 8:10a Have you seen your provider in the last 6 months (3 months if uncontrolled diabetes)?  Clinician Recommendations:  Aim for 30 minutes of exercise or brisk walking, 6-8 glasses of water, and 5 servings of fruits and vegetables each day.       This is a list of the screenings recommended for you:  Health Maintenance  Topic Date Due   Pneumococcal Vaccine for high risk medical condition (1 of 2 - PCV) Never done   Pneumococcal Vaccine for age over 57 (1 of 2 - PCV) Never done   Zoster (Shingles) Vaccine (1 of 2) Never done   Pap with HPV screening  12/24/2020   DTaP/Tdap/Td vaccine (6 - Td or Tdap) 09/16/2022   COVID-19 Vaccine (4 - 2024-25 season) 09/29/2022   Medicare Annual Wellness Visit  09/01/2024   Mammogram  09/11/2024   Colon Cancer Screening  10/12/2026   Hepatitis C Screening  Completed   HIV Screening  Completed   Hepatitis B Vaccine  Aged Out   HPV Vaccine  Aged Out   Meningitis B Vaccine  Aged Out   Flu Shot  Discontinued    Advanced directives: (In Chart) A copy of your advanced directives are scanned into your chart should your provider ever need it. Advance Care Planning is important because it:  [x]  Makes sure you receive the medical care that is consistent with your values, goals, and preferences  [x]  It provides  guidance to your family and loved ones and reduces their decisional burden about whether or not they are making the right decisions based on your wishes.  Follow the link provided in your after visit summary or read over the paperwork we have mailed to you to help you started getting your Advance Directives in place. If you need assistance in completing these, please reach out to us  so that we can help you!  See attachments for Preventive Care and Fall Prevention Tips.

## 2023-09-02 NOTE — Progress Notes (Signed)
 Subjective:   Samantha [name] is a 63 y.o. who presents for a Medicare Wellness preventive visit.  As a reminder, Annual Wellness Visits don't include a physical exam, and some assessments may be limited, especially if this visit is performed virtually. We may recommend an in-person follow-up visit with your provider if needed.  Visit Complete: Virtual I connected with  Samantha [name] on 09/02/23 by a audio enabled telemedicine application and verified that I am speaking with the correct person using two identifiers.  Patient Location: Home  Provider Location: Home Office  I discussed the limitations of evaluation and management by telemedicine. The patient expressed understanding and agreed to proceed.  Vital Signs: Because this visit was a virtual/telehealth visit, some criteria may be missing or patient reported. Any vitals not documented were not able to be obtained and vitals that have been documented are patient reported.    Persons Participating in Visit: Patient.  AWV Questionnaire:  No: Patient Medicare AWV questionnaire was not completed prior to this visit. Cardiac Risk Factors include: advanced age (>104men, >38 women);hypertension     Objective:    Today's Vitals   09/02/23 0816  Weight: 157 lb (71.2 kg)  Height: 5' 8 (1.727 m)   Body mass index is 23.87 kg/m.     09/02/2023    8:22 AM 09/03/2022    4:01 PM 08/30/2021    9:04 AM 08/24/2020    9:44 AM 08/23/2019   10:20 AM 08/18/2018    9:13 AM 06/13/2018    1:35 PM  Advanced Directives  Does Patient Have a Medical Advance Directive? Yes Yes Yes Yes Yes Yes No  Type of Estate agent of [place];Living will Healthcare Power of [place];Living will Healthcare Power of [place];Living will Healthcare Power of [place];Living will Healthcare Power of [place];Living will Healthcare Power of Attorney   Does patient want to make changes to medical advance directive? No - Patient declined No -  Patient declined No - Patient declined  No - Patient declined No - Patient declined    Copy of Healthcare Power of Attorney in Chart? Yes - validated most recent copy scanned in chart (See row information) Yes - validated most recent copy scanned in chart (See row information) Yes - validated most recent copy scanned in chart (See row information) Yes - validated most recent copy scanned in chart (See row information) Yes - validated most recent copy scanned in chart (See row information) Yes - validated most recent copy scanned in chart (See row information)    Would patient like information on creating a medical advance directive?       No - Patient declined      Data saved with a previous flowsheet row definition    Current Medications (verified) Outpatient Encounter Medications as of 09/02/2023  Medication Sig   acetaminophen  (TYLENOL ) 500 MG tablet Take 500 mg by mouth every 6 (six) hours as needed for moderate pain or headache.   aspirin  EC 81 MG tablet Take 1 tablet (81 mg total) by mouth daily.   atorvastatin  (LIPITOR ) 80 MG tablet TAKE 1 TABLET BY MOUTH EVERY DAY AT 6 PM   cyclobenzaprine  (FLEXERIL ) 10 MG tablet TAKE 1 TABLET(10 MG) BY MOUTH THREE TIMES DAILY AS NEEDED (Patient taking differently: Take 10 mg by mouth 3 (three) times daily as needed for muscle spasms.)   hydroxychloroquine  (PLAQUENIL ) 200 MG tablet Take 300 mg by mouth daily. Take 1 tab today and t tabs the next day (tab  hard to break)   loperamide  (IMODIUM  A-D) 2 MG tablet Take 1 tablet (2 mg total) by mouth 4 (four) times daily as needed for diarrhea or loose stools.   metoprolol  succinate (TOPROL -XL) 100 MG 24 hr tablet TAKE 1 TABLET (100 MG TOTAL) BY MOUTH DAILY. IMMEDIATELY WITH OR FOLLOWING A MEAL.   Multiple Vitamin (MULTIVITAMIN) tablet Take 1 tablet by mouth daily.   mycophenolate  (CELLCEPT ) 500 MG tablet Take 1,000 mg by mouth 2 (two) times daily.   nitroGLYCERIN  (NITROSTAT ) 0.4 MG SL tablet PLACE 1 TABLET (0.4 MG  TOTAL) UNDER THE TONGUE EVERY 5 (FIVE) MINUTES AS NEEDED FOR CHEST PAIN.   pantoprazole  (PROTONIX ) 20 MG tablet TAKE 1 TABLET BY MOUTH EVERY DAY   spironolactone  (ALDACTONE ) 25 MG tablet Take 1 tablet (25 mg total) by mouth daily. PATIENT MUST KEEP APPOINTMENT ON JAN FOR ADDITIONAL REFILLS   No facility-administered encounter medications on file as of 09/02/2023.    Allergies (verified) Haemophilus influenzae vaccines and Tramadol    History: Past Medical History:  Diagnosis Date   Altered mental status    Backache, unspecified    Gastric ulcer, unspecified as acute or chronic, without mention of hemorrhage, perforation, or obstruction    Helicobacter pylori (H. pylori)    Hyperlipidemia    Lupus    STEMI (ST elevation myocardial infarction) (HCC)    PCI to p/d RCA   Tobacco abuse    Tobacco use disorder 11/22/2011   Unspecified essential hypertension    Past Surgical History:  Procedure Laterality Date   COLONOSCOPY  10/12/2019   2016   CORONARY/GRAFT ACUTE MI REVASCULARIZATION N/A 05/30/2018   Procedure: Coronary/Graft Acute MI Revascularization;  Surgeon: Verlin Lonni BIRCH, MD;  Location: [facility];  Service: Cardiovascular;  Laterality: N/A;   INTRAOPERATIVE TRANSESOPHAGEAL ECHOCARDIOGRAM N/A 05/30/2018   Procedure: Transesophageal Echocardiogram;  Surgeon: Army Dallas NOVAK, MD;  Location[facility];  Service: Vascular;  Laterality: N/A;   LEFT HEART CATH AND CORONARY ANGIOGRAPHY N/A 05/30/2018   Procedure: LEFT HEART CATH AND CORONARY ANGIOGRAPHY;  Surgeon: Verlin Lonni BIRCH, MD;  Location: [facility];  Service: Cardiovascular;  Laterality: N/A;   POLYPECTOMY     UPPER GASTROINTESTINAL ENDOSCOPY  2013   Family History  Problem Relation Age of Onset   Diabetes Sister    Hypertension Mother    Dementia Mother    Colon cancer Neg Hx    Stomach cancer Neg Hx    Pancreatic cancer Neg Hx    Colon polyps Neg Hx    Rectal cancer Neg Hx    Social History    Socioeconomic History   Marital status: Single    Spouse name: Not on file   Number of children: Not on file   Years of education: Not on file   Highest education level: Not on file  Occupational History   Occupation: permanant disability    Employer: [employer]  Tobacco Use   Smoking status: Every Day    Current packs/day: 0.25    Average packs/day: 0.3 packs/day for 40.0 years (10.0 ttl pk-yrs)    Types: Cigarettes   Smokeless tobacco: Never  Vaping Use   Vaping status: Never Used  Substance and Sexual Activity   Alcohol use: Not Currently    Comment: 1 year since last drink    Drug use: No   Sexual activity: Not Currently    Partners: Male  Other Topics Concern   Not on file  Social History Narrative  Exercise--- walking   Social Drivers of Health   Financial Resource Strain: Low Risk  (09/02/2023)   Overall Financial Resource Strain (CARDIA)    Difficulty of Paying Living Expenses: Not hard at all  Food Insecurity: No Food Insecurity (09/02/2023)   Hunger Vital Sign    Worried About Running Out of Food in the Last Year: Never true    Ran Out of Food in the Last Year: Never true  Transportation Needs: No Transportation Needs (09/02/2023)   PRAPARE - Administrator, Civil Service (Medical): No    Lack of Transportation (Non-Medical): No  Physical Activity: Inactive (09/02/2023)   Exercise Vital Sign    Days of Exercise per Week: 0 days    Minutes of Exercise per Session: 0 min  Stress: No Stress Concern Present (09/02/2023)   [employer] of Occupational Health - Occupational Stress Questionnaire    Feeling of Stress: Not at all  Social Connections: Socially Isolated (09/02/2023)   Social Connection and Isolation Panel    Frequency of Communication with Friends and Family: More than three times a week    Frequency of Social Gatherings with Friends and Family: More than three times a week    Attends Religious Services: Never    Database administrator or  Organizations: No    Attends Engineer, structural: Never    Marital Status: Never married    Tobacco Counseling Ready to quit: Yes Counseling given: Yes    Clinical Intake:  Pre-visit preparation completed: Yes  Pain : No/denies pain     BMI - recorded: 23.87 Nutritional Status: BMI of 19-24  Normal Nutritional Risks: None Diabetes: No  Lab Results  Component Value Date   HGBA1C 5.6 03/13/2011     How often do you need to have someone help you when you read instructions, pamphlets, or other written materials from your doctor or pharmacy?: 1 - Never  Interpreter Needed?: No  Information entered by :: Rojelio Blush LPN   Activities of Daily Living     09/02/2023    8:21 AM 09/03/2022    3:59 PM  In your present state of health, do you have any difficulty performing the following activities:  Hearing? 0 0  Vision? 0 0  Difficulty concentrating or making decisions? 0 0  Walking or climbing stairs? 0 0  Dressing or bathing? 0 0  Doing errands, shopping? 0 0  Preparing Food and eating ? N N  Using the Toilet? N N  In the past six months, have you accidently leaked urine? N N  Do you have problems with loss of bowel control? N N  Managing your Medications? N N  Managing your Finances? N N  Housekeeping or managing your Housekeeping? N N    Patient Care Team: Antonio Meth, Jamee SAUNDERS, DO as PCP - General (Family Medicine) Verlin Lonni BIRCH, MD as PCP - Cardiology (Cardiology) Leanor Charleston, MD as Referring Physician (Rheumatology) Debrah Charleston BIRCH, MD (Inactive) as Consulting Physician (Gastroenterology) Rupard Mayo, OD as Referring Physician (Optometry)  I have updated your Care Teams any recent Medical Services you may have received from other providers in the past year.     Assessment:   This is a routine wellness examination for Samantha [name].  Hearing/Vision screen Hearing Screening - Comments:: Denies hearing difficulties   Vision Screening -  Comments:: - up to date with routine eye exams with  [facility]   Goals Addressed  This Visit's Progress     Increase physical activity (pt-stated)         Depression Screen     09/02/2023    8:21 AM 11/04/2022    9:40 AM 09/03/2022    4:07 PM 08/30/2021    9:05 AM 08/24/2020    9:46 AM 08/23/2019   10:24 AM 08/18/2018    9:14 AM  PHQ 2/9 Scores  PHQ - 2 Score 0 0 0 0 0 0 0    Fall Risk     09/02/2023    8:22 AM 11/04/2022    9:39 AM 09/03/2022    3:59 PM 08/30/2021    9:04 AM 08/24/2020    9:45 AM  Fall Risk   Falls in the past year? 0 0 0 0 0  Number falls in past yr: 0 0 0 0 0  Injury with Fall? 0 0 0 0 0  Risk for fall due to : No Fall Risks  No Fall Risks No Fall Risks   Follow up Falls evaluation completed Falls evaluation completed Falls evaluation completed Falls evaluation completed  Falls prevention discussed      Data saved with a previous flowsheet row definition    MEDICARE RISK AT HOME:  Medicare Risk at Home Any stairs in or around the home?: No If so, are there any without handrails?: No Home free of loose throw rugs in walkways, pet beds, electrical cords, etc?: Yes Adequate lighting in your home to reduce risk of falls?: Yes Life alert?: No Use of a cane, walker or w/c?: No Grab bars in the bathroom?: No Shower chair or bench in shower?: No Elevated toilet seat or a handicapped toilet?: No  TIMED UP AND GO:  Was the test performed?  No  Cognitive Function: 6CIT completed    08/12/2017   10:18 AM  MMSE - Mini Mental State Exam  Orientation to time 5  Orientation to Place 5  Registration 3  Attention/ Calculation 5  Recall 1  Language- name 2 objects 2  Language- repeat 1  Language- follow 3 step command 3  Language- read & follow direction 1  Write a sentence 1  Copy design 1  Total score 28        09/02/2023    8:23 AM 09/03/2022    4:08 PM 08/30/2021    9:09 AM  6CIT Screen  What Year? 0 points 0 points 0 points  What  month? 0 points 0 points 0 points  What time? 0 points 0 points 0 points  Count back from 20 0 points 0 points 0 points  Months in reverse 0 points 4 points 2 points  Repeat phrase 0 points 6 points 8 points  Total Score 0 points 10 points 10 points    Immunizations Immunization History  Administered Date(s) Administered   Dtap, Unspecified 03/03/1962, 04/07/1962, 05/12/1962, 11/30/1965   Measles 05/30/1962   PFIZER(Purple Top)SARS-COV-2 Vaccination 05/17/2019, 06/07/2019, 10/06/2019   Polio, Unspecified 03/03/1962, 05/27/1967   Smallpox 05/12/1962, 05/27/1967   Td (Adult),unspecified 11/01/1975   Tdap 09/15/2012    Screening Tests Health Maintenance  Topic Date Due   Pneumococcal Vaccine: 19-49 Years (1 of 2 - PCV) Never done   Pneumococcal Vaccine: 50+ Years (1 of 2 - PCV) Never done   Zoster Vaccines- Shingrix (1 of 2) Never done   Cervical Cancer Screening (HPV/Pap Cotest)  12/24/2020   DTaP/Tdap/Td (6 - Td or Tdap) 09/16/2022   COVID-19 Vaccine (4 - 2024-25 season) 09/29/2022  Medicare Annual Wellness (AWV)  09/01/2024   MAMMOGRAM  09/11/2024   Colonoscopy  10/12/2026   Hepatitis C Screening  Completed   HIV Screening  Completed   Hepatitis B Vaccines  Aged Out   HPV VACCINES  Aged Out   Meningococcal B Vaccine  Aged Out   INFLUENZA VACCINE  Discontinued    Health Maintenance  Health Maintenance Due  Topic Date Due   Pneumococcal Vaccine: 19-49 Years (1 of 2 - PCV) Never done   Pneumococcal Vaccine: 50+ Years (1 of 2 - PCV) Never done   Zoster Vaccines- Shingrix (1 of 2) Never done   Cervical Cancer Screening (HPV/Pap Cotest)  12/24/2020   DTaP/Tdap/Td (6 - Td or Tdap) 09/16/2022   COVID-19 Vaccine (4 - 2024-25 season) 09/29/2022   Health Maintenance Items Addressed:   Additional Screening:  Vision Screening: Recommended annual ophthalmology exams for early detection of glaucoma and other disorders of the eye. Would you like a referral to an eye doctor?  No    Dental Screening: Recommended annual dental exams for proper oral hygiene  Community Resource Referral / Chronic Care Management: CRR required this visit?  No   CCM required this visit?  No   Plan:    I have personally reviewed and noted the following in the patient's chart:   Medical and social history Use of alcohol, tobacco or illicit drugs  Current medications and supplements including opioid prescriptions. Patient is not currently taking opioid prescriptions. Functional ability and status Nutritional status Physical activity Advanced directives List of other physicians Hospitalizations, surgeries, and ER visits in previous 12 months Vitals Screenings to include cognitive, depression, and falls Referrals and appointments  In addition, I have reviewed and discussed with patient certain preventive protocols, quality metrics, and best practice recommendations. A written personalized care plan for preventive services as well as general preventive health recommendations were provided to patient.   Rojelio LELON Blush, LPN   02/01/7972   After Visit Summary: (MyChart) Due to this being a telephonic visit, the after visit summary with patients personalized plan was offered to patient via MyChart   Notes: Nothing significant to report at this time.

## 2023-09-22 ENCOUNTER — Ambulatory Visit (HOSPITAL_COMMUNITY)
Admission: RE | Admit: 2023-09-22 | Discharge: 2023-09-22 | Disposition: A | Discharge status: Home / Self Care | Source: Ambulatory Visit | Attending: Rheumatology | Admitting: Rheumatology

## 2023-09-22 ENCOUNTER — Other Ambulatory Visit: Payer: Self-pay | Admitting: Family Medicine

## 2023-09-22 DIAGNOSIS — G6189 Other inflammatory polyneuropathies: Secondary | ICD-10-CM | POA: Diagnosis not present

## 2023-09-22 DIAGNOSIS — Z1231 Encounter for screening mammogram for malignant neoplasm of breast: Secondary | ICD-10-CM

## 2023-09-22 MED ORDER — IMMUNE GLOBULIN (HUMAN) 20 GM/200ML IV SOLN
INTRAVENOUS | Status: AC
  Filled 2023-09-22: qty 200

## 2023-09-22 MED ORDER — IMMUNE GLOBULIN (HUMAN) 10 GM/100ML IV SOLN
INTRAVENOUS | Status: AC
  Filled 2023-09-22: qty 100

## 2023-09-22 MED ORDER — IMMUNE GLOBULIN (HUMAN) 10 GM/100ML IV SOLN
70.0000 g | INTRAVENOUS | Status: DC
  Administered 2023-09-22: 70 g via INTRAVENOUS

## 2023-09-22 MED ORDER — ACETAMINOPHEN 325 MG PO TABS
ORAL_TABLET | ORAL | Status: AC
  Filled 2023-09-22: qty 2

## 2023-09-22 MED ORDER — DIPHENHYDRAMINE HCL 25 MG PO CAPS
ORAL_CAPSULE | ORAL | Status: AC
  Filled 2023-09-22: qty 1

## 2023-09-22 MED ORDER — IMMUNE GLOBULIN (HUMAN) 40 GM/400ML IV SOLN
INTRAVENOUS | Status: AC
  Filled 2023-09-22: qty 400

## 2023-09-22 MED ORDER — SODIUM CHLORIDE 0.9 % IV SOLN: INTRAVENOUS | Status: DC | PRN

## 2023-09-22 MED ORDER — DIPHENHYDRAMINE HCL 25 MG PO CAPS
25.0000 mg | ORAL_CAPSULE | Freq: Once | ORAL | Status: AC
Start: 2023-09-22 — End: 2023-09-22
  Administered 2023-09-22: 25 mg via ORAL

## 2023-09-22 MED ORDER — ACETAMINOPHEN 325 MG PO TABS
650.0000 mg | ORAL_TABLET | Freq: Once | ORAL | Status: AC
  Administered 2023-09-22: 650 mg via ORAL

## 2023-10-10 ENCOUNTER — Other Ambulatory Visit (HOSPITAL_COMMUNITY): Payer: Self-pay | Admitting: Rheumatology

## 2023-10-10 DIAGNOSIS — Z79899 Other long term (current) drug therapy: Secondary | ICD-10-CM | POA: Diagnosis not present

## 2023-10-10 DIAGNOSIS — H5203 Hypermetropia, bilateral: Secondary | ICD-10-CM | POA: Diagnosis not present

## 2023-10-10 DIAGNOSIS — H35373 Puckering of macula, bilateral: Secondary | ICD-10-CM | POA: Diagnosis not present

## 2023-10-13 ENCOUNTER — Other Ambulatory Visit: Payer: Self-pay | Admitting: Family Medicine

## 2023-10-13 DIAGNOSIS — I10 Essential (primary) hypertension: Secondary | ICD-10-CM

## 2023-10-14 ENCOUNTER — Other Ambulatory Visit (HOSPITAL_COMMUNITY): Payer: Self-pay | Admitting: Rheumatology

## 2023-10-16 ENCOUNTER — Ambulatory Visit
Admission: RE | Admit: 2023-10-16 | Discharge: 2023-10-16 | Disposition: A | Discharge status: Home / Self Care | Source: Ambulatory Visit | Attending: Family Medicine | Admitting: Family Medicine

## 2023-10-16 DIAGNOSIS — Z1231 Encounter for screening mammogram for malignant neoplasm of breast: Secondary | ICD-10-CM | POA: Diagnosis not present

## 2023-10-20 ENCOUNTER — Ambulatory Visit: Admitting: Family Medicine

## 2023-10-20 ENCOUNTER — Encounter: Payer: Self-pay | Admitting: Family Medicine

## 2023-10-20 VITALS — BP 110/70 | HR 78 | Temp 98.2°F | Resp 18 | Ht 68.0 in | Wt 148.8 lb

## 2023-10-20 DIAGNOSIS — E785 Hyperlipidemia, unspecified: Secondary | ICD-10-CM

## 2023-10-20 DIAGNOSIS — I10 Essential (primary) hypertension: Secondary | ICD-10-CM

## 2023-10-20 LAB — COMPREHENSIVE METABOLIC PANEL WITH GFR
ALT: 34 U/L (ref 0–35)
AST: 31 U/L (ref 0–37)
Albumin: 4.1 g/dL (ref 3.5–5.2)
Alkaline Phosphatase: 48 U/L (ref 39–117)
BUN: 19 mg/dL (ref 6–23)
CO2: 31 meq/L (ref 19–32)
Calcium: 9.9 mg/dL (ref 8.4–10.5)
Chloride: 101 meq/L (ref 96–112)
Creatinine, Ser: 0.97 mg/dL (ref 0.40–1.20)
GFR: 62.7 mL/min (ref >60.00)
Glucose, Bld: 109 mg/dL — ABNORMAL HIGH (ref 70–99)
Potassium: 4.8 meq/L (ref 3.5–5.1)
Sodium: 140 meq/L (ref 135–145)
Total Bilirubin: 0.6 mg/dL (ref 0.2–1.2)
Total Protein: 7.1 g/dL (ref 6.0–8.3)

## 2023-10-20 LAB — LIPID PANEL
Cholesterol: 141 mg/dL (ref 0–200)
HDL: 54.7 mg/dL (ref >39.00)
LDL Cholesterol: 69 mg/dL (ref 0–99)
NonHDL: 86.68
Total CHOL/HDL Ratio: 3
Triglycerides: 87 mg/dL (ref 0.0–149.0)
VLDL: 17.4 mg/dL (ref 0.0–40.0)

## 2023-10-20 LAB — CBC WITH DIFFERENTIAL/PLATELET
Basophils Absolute: 0 K/uL (ref 0.0–0.1)
Basophils Relative: 1.1 % (ref 0.0–3.0)
Eosinophils Absolute: 0.5 K/uL (ref 0.0–0.7)
Eosinophils Relative: 12.6 % — ABNORMAL HIGH (ref 0.0–5.0)
HCT: 41.9 % (ref 36.0–46.0)
Hemoglobin: 13.3 g/dL (ref 12.0–15.0)
Lymphocytes Relative: 39 % (ref 12.0–46.0)
Lymphs Abs: 1.4 K/uL (ref 0.7–4.0)
MCHC: 31.8 g/dL (ref 30.0–36.0)
MCV: 88.3 fl (ref 78.0–100.0)
Monocytes Absolute: 0.4 K/uL (ref 0.1–1.0)
Monocytes Relative: 9.5 % (ref 3.0–12.0)
Neutro Abs: 1.4 K/uL (ref 1.4–7.7)
Neutrophils Relative %: 37.8 % — ABNORMAL LOW (ref 43.0–77.0)
Platelets: 211 K/uL (ref 150.0–400.0)
RBC: 4.75 Mil/uL (ref 3.87–5.11)
RDW: 13.4 % (ref 11.5–15.5)
WBC: 3.7 K/uL — ABNORMAL LOW (ref 4.0–10.5)

## 2023-10-20 LAB — TSH: TSH: 0.8 u[IU]/mL (ref 0.35–5.50)

## 2023-10-20 NOTE — Assessment & Plan Note (Signed)
 Well controlled, no changes to meds. Encouraged heart healthy diet such as the DASH diet and exercise as tolerated.

## 2023-10-20 NOTE — Assessment & Plan Note (Signed)
 Encourage heart healthy diet such as MIND or DASH diet, increase exercise, avoid trans fats, simple carbohydrates and processed foods, consider a krill or fish or flaxseed oil cap daily.

## 2023-10-20 NOTE — Patient Instructions (Signed)

## 2023-10-20 NOTE — Progress Notes (Signed)
 *  Subjective:    Patient ID: Samantha [name], female    DOB: 08/27/1960, 63 y.o.   MRN: [id number]  Chief Complaint  Patient presents with   Hypertension   Follow-up    HPI Patient is in today for f/u bp and cholesterol.   Discussed the use of AI scribe software for clinical note transcription with the patient, who gave verbal consent to proceed.  History of Present Illness Samantha [name] is a 63 year old female who presents for routine follow-up and blood work.  She reports no symptoms such as dizziness. Previously, her blood pressure was 120/80 mmHg during her last rheumatology visit in February. She does not monitor her blood pressure at home.  She is on an automatic refill system for her medications, managed by her oncologist. One medication is delivered, while the rest are obtained from [facility].  She declines vaccinations for flu, pneumonia, shingles, and tetanus, as well as Pap smears.  She engages in regular physical activity, including yard work such as picking up sticks and leaves.   Past Medical History:  Diagnosis Date   Altered mental status    Backache, unspecified    Gastric ulcer, unspecified as acute or chronic, without mention of hemorrhage, perforation, or obstruction    Helicobacter pylori (H. pylori)    Hyperlipidemia    Lupus    STEMI (ST elevation myocardial infarction) (HCC)    PCI to p/d RCA   Tobacco abuse    Tobacco use disorder 11/22/2011   Unspecified essential hypertension     Past Surgical History:  Procedure Laterality Date   COLONOSCOPY  10/12/2019   2016   CORONARY/GRAFT ACUTE MI REVASCULARIZATION N/A 05/30/2018   Procedure: Coronary/Graft Acute MI Revascularization;  Surgeon: Verlin Lonni BIRCH, MD;  Location: [facility];  Service: Cardiovascular;  Laterality: N/A;   INTRAOPERATIVE TRANSESOPHAGEAL ECHOCARDIOGRAM N/A 05/30/2018   Procedure: Transesophageal Echocardiogram;  Surgeon: Army Dallas NOVAK, MD;  Location: [facility];   Service: Vascular;  Laterality: N/A;   LEFT HEART CATH AND CORONARY ANGIOGRAPHY N/A 05/30/2018   Procedure: LEFT HEART CATH AND CORONARY ANGIOGRAPHY;  Surgeon: Verlin Lonni BIRCH, MD;  Location: [facility];  Service: Cardiovascular;  Laterality: N/A;   POLYPECTOMY     UPPER GASTROINTESTINAL ENDOSCOPY  2013    Family History  Problem Relation Age of Onset   Hypertension Mother    Dementia Mother    Diabetes Sister    Colon cancer Neg Hx    Stomach cancer Neg Hx    Pancreatic cancer Neg Hx    Colon polyps Neg Hx    Rectal cancer Neg Hx    Breast cancer Neg Hx     Social History   Socioeconomic History   Marital status: Single    Spouse name: Not on file   Number of children: Not on file   Years of education: Not on file   Highest education level: Not on file  Occupational History   Occupation: permanant disability    Employer: [employer]  Tobacco Use   Smoking status: Every Day    Current packs/day: 0.25    Average packs/day: 0.3 packs/day for 40.0 years (10.0 ttl pk-yrs)    Types: Cigarettes   Smokeless tobacco: Never  Vaping Use   Vaping status: Never Used  Substance and Sexual Activity   Alcohol use: Not Currently    Comment: 1 year since last drink    Drug use: No   Sexual activity: Not  Currently    Partners: Male  Other Topics Concern   Not on file  Social History Narrative   Exercise--- walking   Social Drivers of Health   Financial Resource Strain: Low Risk  (09/02/2023)   Overall Financial Resource Strain (CARDIA)    Difficulty of Paying Living Expenses: Not hard at all  Food Insecurity: No Food Insecurity (09/02/2023)   Hunger Vital Sign    Worried About Running Out of Food in the Last Year: Never true    Ran Out of Food in the Last Year: Never true  Transportation Needs: No Transportation Needs (09/02/2023)   PRAPARE - Administrator, Civil Service (Medical): No    Lack of Transportation (Non-Medical): No  Physical Activity:  Inactive (09/02/2023)   Exercise Vital Sign    Days of Exercise per Week: 0 days    Minutes of Exercise per Session: 0 min  Stress: No Stress Concern Present (09/02/2023)   [employer] of Occupational Health - Occupational Stress Questionnaire    Feeling of Stress: Not at all  Social Connections: Socially Isolated (09/02/2023)   Social Connection and Isolation Panel    Frequency of Communication with Friends and Family: More than three times a week    Frequency of Social Gatherings with Friends and Family: More than three times a week    Attends Religious Services: Never    Database administrator or Organizations: No    Attends Banker Meetings: Never    Marital Status: Never married  Intimate Partner Violence: Not At Risk (09/02/2023)   Humiliation, Afraid, Rape, and Kick questionnaire    Fear of Current or Ex-Partner: No    Emotionally Abused: No    Physically Abused: No    Sexually Abused: No    Outpatient Medications Prior to Visit  Medication Sig Dispense Refill   acetaminophen  (TYLENOL ) 500 MG tablet Take 500 mg by mouth every 6 (six) hours as needed for moderate pain or headache.     aspirin  EC 81 MG tablet Take 1 tablet (81 mg total) by mouth daily.     atorvastatin  (LIPITOR ) 80 MG tablet TAKE 1 TABLET BY MOUTH EVERY DAY AT 6 PM 90 tablet 3   cyclobenzaprine  (FLEXERIL ) 10 MG tablet TAKE 1 TABLET(10 MG) BY MOUTH THREE TIMES DAILY AS NEEDED (Patient taking differently: Take 10 mg by mouth 3 (three) times daily as needed for muscle spasms.) 30 tablet 0   hydroxychloroquine  (PLAQUENIL ) 200 MG tablet Take 300 mg by mouth daily. Take 1 tab today and t tabs the next day (tab hard to break)     loperamide  (IMODIUM  A-D) 2 MG tablet Take 1 tablet (2 mg total) by mouth 4 (four) times daily as needed for diarrhea or loose stools. 30 tablet 0   metoprolol  succinate (TOPROL -XL) 100 MG 24 hr tablet Take 1 tablet (100 mg total) by mouth daily. Immediately with or following a meal.  Need appt 30 tablet 0   Multiple Vitamin (MULTIVITAMIN) tablet Take 1 tablet by mouth daily.     mycophenolate  (CELLCEPT ) 500 MG tablet Take 1,000 mg by mouth 2 (two) times daily.     nitroGLYCERIN  (NITROSTAT ) 0.4 MG SL tablet PLACE 1 TABLET (0.4 MG TOTAL) UNDER THE TONGUE EVERY 5 (FIVE) MINUTES AS NEEDED FOR CHEST PAIN. 25 tablet 0   pantoprazole  (PROTONIX ) 20 MG tablet TAKE 1 TABLET BY MOUTH EVERY DAY 30 tablet 2   spironolactone  (ALDACTONE ) 25 MG tablet Take 1 tablet (25 mg total)  by mouth daily. PATIENT MUST KEEP APPOINTMENT ON JAN FOR ADDITIONAL REFILLS 90 tablet 3   No facility-administered medications prior to visit.    Allergies  Allergen Reactions   Haemophilus Influenzae Vaccines     Hx GBS   Tramadol  Nausea Only    Review of Systems  Constitutional:  Negative for chills, fever and malaise/fatigue.  HENT:  Negative for congestion and hearing loss.   Eyes:  Negative for blurred vision and discharge.  Respiratory:  Negative for cough, sputum production and shortness of breath.   Cardiovascular:  Negative for chest pain, palpitations and leg swelling.  Gastrointestinal:  Negative for abdominal pain, blood in stool, constipation, diarrhea, heartburn, nausea and vomiting.  Genitourinary:  Negative for dysuria, frequency, hematuria and urgency.  Musculoskeletal:  Negative for back pain, falls and myalgias.  Skin:  Negative for rash.  Neurological:  Negative for dizziness, sensory change, loss of consciousness, weakness and headaches.  Endo/Heme/Allergies:  Negative for environmental allergies. Does not bruise/bleed easily.  Psychiatric/Behavioral:  Negative for depression and suicidal ideas. The patient is not nervous/anxious and does not have insomnia.    Review of Systems  Constitutional: Negative for activity change, appetite change and fatigue.  HENT: Negative for hearing loss, congestion, tinnitus and ear discharge.  dentist q32m Eyes: Negative for visual disturbance (see  optho q1y -- vision corrected to 20/20 with glasses).  Respiratory: Negative for cough, chest tightness and shortness of breath.   Cardiovascular: Negative for chest pain, palpitations and leg swelling.  Gastrointestinal: Negative for abdominal pain, diarrhea, constipation and abdominal distention.  Genitourinary: Negative for urgency, frequency, decreased urine volume and difficulty urinating.  Musculoskeletal: Negative for back pain, arthralgias and gait problem.  Skin: Negative for color change, pallor and rash.  Neurological: Negative for dizziness, light-headedness, numbness and headaches.  Hematological: Negative for adenopathy. Does not bruise/bleed easily.  Psychiatric/Behavioral: Negative for suicidal ideas, confusion, sleep disturbance, self-injury, dysphoric mood, decreased concentration and agitation.        Objective:    Physical Exam Vitals and nursing note reviewed.  Constitutional:      General: She is not in acute distress.    Appearance: Normal appearance. She is well-developed.  HENT:     Head: Normocephalic and atraumatic.  Eyes:     General: No scleral icterus.       Right eye: No discharge.        Left eye: No discharge.  Cardiovascular:     Rate and Rhythm: Normal rate and regular rhythm.     Heart sounds: No murmur heard. Pulmonary:     Effort: Pulmonary effort is normal. No respiratory distress.     Breath sounds: Normal breath sounds.  Musculoskeletal:        General: Normal range of motion.     Cervical back: Normal range of motion and neck supple.     Right lower leg: No edema.     Left lower leg: No edema.  Skin:    General: Skin is warm and dry.  Neurological:     Mental Status: She is alert and oriented to person, place, and time.  Psychiatric:        Mood and Affect: Mood normal.        Behavior: Behavior normal.        Thought Content: Thought content normal.        Judgment: Judgment normal.     BP 110/70 (BP Location: Left Arm,  Patient Position: Sitting, Cuff Size: Normal)  Pulse 78   Temp 98.2 F (36.8 C) (Oral)   Resp 18   Ht 5' 8 (1.727 m)   Wt 148 lb 12.8 oz (67.5 kg)   LMP 04/11/2011   SpO2 96%   BMI 22.62 kg/m  Wt Readings from Last 3 Encounters:  10/20/23 148 lb 12.8 oz (67.5 kg)  09/22/23 142 lb (64.4 kg)  09/02/23 157 lb (71.2 kg)    Diabetic Foot Exam - Simple   No data filed    Lab Results  Component Value Date   WBC 3.4 (L) 11/04/2022   HGB 13.9 11/04/2022   HCT 43.5 11/04/2022   PLT 185.0 11/04/2022   GLUCOSE 88 11/04/2022   CHOL 217 (H) 11/04/2022   TRIG 86.0 11/04/2022   HDL 73.00 11/04/2022   LDLDIRECT 141.8 09/15/2012   LDLCALC 127 (H) 11/04/2022   ALT 29 11/04/2022   AST 29 11/04/2022   NA 141 11/04/2022   K 4.6 11/04/2022   CL 107 11/04/2022   CREATININE 0.72 11/04/2022   BUN 14 11/04/2022   CO2 27 11/04/2022   TSH 0.86 11/04/2022   INR 1.1 05/30/2018   HGBA1C 5.6 03/13/2011    Lab Results  Component Value Date   TSH 0.86 11/04/2022   Lab Results  Component Value Date   WBC 3.4 (L) 11/04/2022   HGB 13.9 11/04/2022   HCT 43.5 11/04/2022   MCV 90.6 11/04/2022   PLT 185.0 11/04/2022   Lab Results  Component Value Date   NA 141 11/04/2022   K 4.6 11/04/2022   CO2 27 11/04/2022   GLUCOSE 88 11/04/2022   BUN 14 11/04/2022   CREATININE 0.72 11/04/2022   BILITOT 0.3 11/04/2022   ALKPHOS 51 11/04/2022   AST 29 11/04/2022   ALT 29 11/04/2022   PROT 6.8 11/04/2022   ALBUMIN 4.0 11/04/2022   CALCIUM  9.4 11/04/2022   ANIONGAP 11 06/16/2018   GFR 90.27 11/04/2022   Lab Results  Component Value Date   CHOL 217 (H) 11/04/2022   Lab Results  Component Value Date   HDL 73.00 11/04/2022   Lab Results  Component Value Date   LDLCALC 127 (H) 11/04/2022   Lab Results  Component Value Date   TRIG 86.0 11/04/2022   Lab Results  Component Value Date   CHOLHDL 3 11/04/2022   Lab Results  Component Value Date   HGBA1C 5.6 03/13/2011        Assessment & Plan:  Essential hypertension -     CBC with Differential/Platelet -     TSH  Hyperlipidemia LDL goal <70 Assessment & Plan: Encourage heart healthy diet such as MIND or DASH diet, increase exercise, avoid trans fats, simple carbohydrates and processed foods, consider a krill or fish or flaxseed oil cap daily.    Orders: -     CBC with Differential/Platelet -     Comprehensive metabolic panel with GFR -     Lipid panel  Primary hypertension Assessment & Plan: Well controlled, no changes to meds. Encouraged heart healthy diet such as the DASH diet and exercise as tolerated.     Assessment and Plan Assessment & Plan Hypotension, unspecified   Blood pressure initially measured at 96/68 mmHg. She reports no dizziness. Previous reading was 120/80 mmHg in February during a rheumatology visit. No current concerns if she remains asymptomatic. Monitor for dizziness and recheck blood pressure if symptoms develop.  Repeat was improved   Jamee JONELLE Antonio Cyndee, DO

## 2023-10-21 ENCOUNTER — Ambulatory Visit: Payer: Self-pay | Admitting: Family Medicine

## 2023-11-07 ENCOUNTER — Other Ambulatory Visit: Payer: Self-pay | Admitting: Family Medicine

## 2023-11-07 DIAGNOSIS — I10 Essential (primary) hypertension: Secondary | ICD-10-CM

## 2023-11-17 ENCOUNTER — Ambulatory Visit (HOSPITAL_COMMUNITY)
Admission: RE | Admit: 2023-11-17 | Discharge: 2023-11-17 | Disposition: A | Discharge status: Home / Self Care | Source: Ambulatory Visit | Attending: Rheumatology | Admitting: Rheumatology

## 2023-11-17 VITALS — BP 117/78 | HR 72 | Temp 98.5°F | Resp 16 | Wt 143.0 lb

## 2023-11-17 DIAGNOSIS — G6189 Other inflammatory polyneuropathies: Secondary | ICD-10-CM | POA: Diagnosis not present

## 2023-11-17 DIAGNOSIS — M329 Systemic lupus erythematosus, unspecified: Secondary | ICD-10-CM | POA: Insufficient documentation

## 2023-11-17 MED ORDER — ACETAMINOPHEN 325 MG PO TABS
ORAL_TABLET | ORAL | Status: AC
  Filled 2023-11-17: qty 2

## 2023-11-17 MED ORDER — IMMUNE GLOBULIN (HUMAN) 5 GM/50ML IV SOLN
70.0000 g | Freq: Once | INTRAVENOUS | Status: AC
  Administered 2023-11-17: 70 g via INTRAVENOUS
  Filled 2023-11-17: qty 700

## 2023-11-17 MED ORDER — DIPHENHYDRAMINE HCL 25 MG PO CAPS
25.0000 mg | ORAL_CAPSULE | Freq: Once | ORAL | Status: AC
  Administered 2023-11-17: 25 mg via ORAL

## 2023-11-17 MED ORDER — DEXTROSE 5 % IV SOLN: INTRAVENOUS | Status: DC

## 2023-11-17 MED ORDER — IMMUNE GLOBULIN (HUMAN) 40 GM/400ML IV SOLN
INTRAVENOUS | Status: AC
Start: 2023-11-17 — End: 2023-11-17
  Filled 2023-11-17: qty 400

## 2023-11-17 MED ORDER — IMMUNE GLOBULIN (HUMAN) 20 GM/200ML IV SOLN
INTRAVENOUS | Status: AC
Start: 2023-11-17 — End: 2023-11-17
  Filled 2023-11-17: qty 200

## 2023-11-17 MED ORDER — IMMUNE GLOBULIN (HUMAN) 10 GM/100ML IV SOLN
INTRAVENOUS | Status: AC
  Filled 2023-11-17: qty 100

## 2023-11-17 MED ORDER — ACETAMINOPHEN 325 MG PO TABS
650.0000 mg | ORAL_TABLET | Freq: Once | ORAL | Status: AC
  Administered 2023-11-17: 650 mg via ORAL

## 2023-11-17 MED ORDER — DIPHENHYDRAMINE HCL 25 MG PO CAPS
ORAL_CAPSULE | ORAL | Status: AC
  Filled 2023-11-17: qty 1

## 2023-11-28 ENCOUNTER — Other Ambulatory Visit: Payer: Self-pay | Admitting: Pharmacist Clinician (PhC)/ Clinical Pharmacy Specialist

## 2023-11-28 MED ORDER — ATORVASTATIN CALCIUM 80 MG PO TABS: ORAL_TABLET | ORAL | 0 refills | Status: DC

## 2023-12-01 ENCOUNTER — Other Ambulatory Visit: Payer: Self-pay | Admitting: Cardiovascular Disease

## 2023-12-24 ENCOUNTER — Other Ambulatory Visit: Payer: Self-pay | Admitting: Cardiovascular Disease

## 2023-12-24 ENCOUNTER — Telehealth: Payer: Self-pay | Admitting: Cardiovascular Disease

## 2023-12-24 MED ORDER — SPIRONOLACTONE 25 MG PO TABS: 25.0000 mg | ORAL_TABLET | Freq: Every day | ORAL | 0 refills | Status: DC

## 2023-12-24 NOTE — Telephone Encounter (Signed)
 1. Which medications need to be refilled? (please list name of each medication and dose if known)   spironolactone  (ALDACTONE ) 25 MG tablet    2. Would you like to learn more about the convenience, safety, & potential cost savings by using the [facility]? no     3. Are you open to using the [facility] (Type [facility]. no     4. Which pharmacy/location (including street and city if local pharmacy) is medication to be sent to?    [id number] - [place], [place] - [address]     5. Do they need a 30 day or 90 day supply? 30 day.

## 2023-12-24 NOTE — Telephone Encounter (Signed)
 Refills has been sent to the pharmacy.

## 2024-01-12 ENCOUNTER — Inpatient Hospital Stay (HOSPITAL_COMMUNITY)
Admission: RE | Admit: 2024-01-12 | Discharge: 2024-01-12 | Disposition: A | Source: Ambulatory Visit | Attending: Rheumatology | Admitting: Rheumatology

## 2024-01-12 VITALS — BP 115/70 | HR 65 | Temp 97.9°F | Resp 17 | Wt 141.1 lb

## 2024-01-12 DIAGNOSIS — G6189 Other inflammatory polyneuropathies: Secondary | ICD-10-CM

## 2024-01-12 DIAGNOSIS — M329 Systemic lupus erythematosus, unspecified: Secondary | ICD-10-CM

## 2024-01-12 MED ORDER — IMMUNE GLOBULIN (HUMAN) 5 GM/50ML IV SOLN
70.0000 g | Freq: Once | INTRAVENOUS | Status: AC
  Administered 2024-01-12: 08:00:00 70 g via INTRAVENOUS
  Filled 2024-01-12: qty 700

## 2024-01-12 MED ORDER — DEXTROSE 5 % IV SOLN: INTRAVENOUS | Status: DC

## 2024-01-12 MED ORDER — DIPHENHYDRAMINE HCL 25 MG PO CAPS
ORAL_CAPSULE | ORAL | Status: AC
  Filled 2024-01-12: qty 1

## 2024-01-12 MED ORDER — IMMUNE GLOBULIN (HUMAN) 20 GM/200ML IV SOLN
INTRAVENOUS | Status: AC
  Filled 2024-01-12: qty 200

## 2024-01-12 MED ORDER — ACETAMINOPHEN 325 MG PO TABS
650.0000 mg | ORAL_TABLET | Freq: Once | ORAL | Status: AC
  Administered 2024-01-12: 08:00:00 650 mg via ORAL

## 2024-01-12 MED ORDER — ACETAMINOPHEN 325 MG PO TABS
ORAL_TABLET | ORAL | Status: AC
  Filled 2024-01-12: qty 2

## 2024-01-12 MED ORDER — IMMUNE GLOBULIN (HUMAN) 40 GM/400ML IV SOLN
INTRAVENOUS | Status: AC
  Filled 2024-01-12: qty 400

## 2024-01-12 MED ORDER — DIPHENHYDRAMINE HCL 25 MG PO CAPS
25.0000 mg | ORAL_CAPSULE | Freq: Once | ORAL | Status: AC
  Administered 2024-01-12: 08:00:00 25 mg via ORAL

## 2024-01-12 MED ORDER — IMMUNE GLOBULIN (HUMAN) 10 GM/100ML IV SOLN
INTRAVENOUS | Status: AC
  Filled 2024-01-12: qty 100

## 2024-01-21 ENCOUNTER — Ambulatory Visit: Admitting: Emergency Medicine

## 2024-01-25 NOTE — Progress Notes (Unsigned)
 "  Cardiology Office Note    Date:  01/27/2024  ID:  Samentha, Perham 12-25-1960, MRN [id number] PCP:  Antonio Meth, Jamee JONELLE, DO  Cardiologist:  Lonni Cash, MD  Electrophysiologist:  None   Chief Complaint: Follow-up for CAD History of Present Illness: .   Samantha [name] is a 63 y.o. female with visit-pertinent history of lupus, CAD, hyperlipidemia, hypertension and tobacco use.  In May 2020 patient was admitted to [facility] with an inferior STEMI treated with DES in the proximal and distal RCA.  Her initial presentation was initially worrisome from an aortic dissection based on her CTA however TEE did not show an aortic dissection.  Chest CTA in August 2020 with resolution of aortic fluid/soft tissue abnormality.  No evidence of aortic dissection.  Echocardiogram in May 2020 with normal LV function and size.  She was last seen in clinic by Dr. Cash on 11/22/2022.  She remains stable for cardiac standpoint.  Today she presents for follow-up.  She reports that she has been doing well overall.  She denies any chest pain, shortness of breath, lower extremity edema, orthopnea or PND.  She denies any presyncope, dizziness, lightheadedness or syncope.  Patient notes that she does not intentionally exercise but regularly blows leaves in her yard and walks around her house to complete outside household chores.  She denies any cardiac concerns or complaints today. ROS: .   Today she denies chest pain, shortness of breath, lower extremity edema, fatigue, palpitations, melena, hematuria, hemoptysis, diaphoresis, weakness, presyncope, syncope, orthopnea, and PND.  All other systems are reviewed and otherwise negative. Studies Reviewed: SABRA   EKG:  EKG is ordered today, personally reviewed, demonstrating  EKG Interpretation Date/Time:  Tuesday January 27 2024 09:50:58 EST Ventricular Rate:  72 PR Interval:  166 QRS Duration:  84 QT Interval:  378 QTC Calculation: 413 R  Axis:   47  Text Interpretation: Normal sinus rhythm Possible Left atrial enlargement T wave inversion inferior leads T wave inversion anterior leads When compared with ECG of 22-Nov-2022 11:42, Nonspecific T wave abnormality now evident in Lateral leads Confirmed by [place] Silveria [phone]) on 01/27/2024 7:33:38 PM   CV Studies: Cardiac studies reviewed are outlined and summarized above. Otherwise please see EMR for full report. Cardiac Studies & Procedures   ______________________________________________________________________________________________ CARDIAC CATHETERIZATION  CARDIAC CATHETERIZATION 05/30/2018  Conclusion  Mid LAD lesion is 20% stenosed.  Ost LAD to Prox LAD lesion is 20% stenosed.  Prox RCA lesion is 100% stenosed.  Dist RCA lesion is 90% stenosed.  A drug-eluting stent was successfully placed using a STENT SYNERGY DES 3X24.  Post intervention, there is a 0% residual stenosis.  A drug-eluting stent was successfully placed using a STENT SYNERGY DES 2.75X20.  Post intervention, there is a 0% residual stenosis.  1. 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 Successful PTCA/aspiration thrombectomy and drug eluting stent placement x 1 in the proximal RCA and drug eluting stent placement x 1 in the distal RCA. Significant thrombus burden remains in the mid RCA and distal RCA branches. 3. Mild non-obstructive disease in the LAD. 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 Cardiogenic shock  Recommendations: She is hemodynamically stable on low dose Levophed . Will continue Levophed . Will continue ASA. Will give a loading dose of Brilinta  180 mg x 1 in the ICU after OG tube placement. Continue Cangrelor  drip until one hour after the Brilinta  is given. Will then stop Cangrelor   drip and start Aggrastat  drip which will infuse for the next 36-48 hours. Plan re-look cardiac cath Monday. I am hopeful that the thrombus burden  that remains in the vessel will resolve with medical therapy. Beta blocker tomorrow if shock resolved. Will start high intensity statin. Echo tomorrow.  Findings Coronary Findings Diagnostic  Dominance: Right  Left Anterior Descending Vessel is large. Ost LAD to Prox LAD lesion is 20% stenosed. Mid LAD lesion is 20% stenosed.  Left Circumflex Vessel is small.  Right Coronary Artery Vessel is large. Prox RCA lesion is 100% stenosed. The lesion is heavily thrombotic. Dist RCA lesion is 90% stenosed.  Right Posterior Atrioventricular Artery Collaterals RPAV filled by collaterals from 3rd Sept.  Intervention  Prox RCA lesion Stent CATH [place]ISTA GUIDE 6FR JR4 guide catheter was inserted. Pre-stent angioplasty was performed using a BALLOON SAPPHIRE 2.5X12. A drug-eluting stent was successfully placed using a STENT SYNERGY DES 3X24. Stent strut is well apposed. Post-stent angioplasty was performed using a BALLOON SAPPHIRE [place] 3.5X18. Post-Intervention Lesion Assessment The intervention was successful. Pre-interventional TIMI flow is 0. Post-intervention TIMI flow is 3. No complications occurred at this lesion. There is a 0% residual stenosis post intervention.  Dist RCA lesion Stent Pre-stent angioplasty was performed using a BALLOON SAPPHIRE 2.5X12. A drug-eluting stent was successfully placed using a STENT SYNERGY DES 2.75X20. Stent strut is well apposed. Stent does not overlap previously placed stentPost-stent angioplasty was not performed. Post-Intervention Lesion Assessment The intervention was successful. Pre-interventional TIMI flow is 0. Post-intervention TIMI flow is 3. No complications occurred at this lesion. There is a 0% residual stenosis post intervention.     ECHOCARDIOGRAM  ECHOCARDIOGRAM COMPLETE 05/31/2018  Narrative ECHOCARDIOGRAM REPORT    Patient Name:   POLA FURNO Date of Exam: 05/31/2018 Medical Rec #:  [id number]         Height:       68.0  in Accession #:    [id number]        Weight:       160.0 lb Date of Birth:  [birth year]         BSA:          1.86 m Patient Age:    57 years          BP:           115/65 mmHg Patient Gender: F                 HR:           46 bpm. Exam Location:  Inpatient   Procedure: Limited Color Doppler, Cardiac Doppler and Limited Echo  Indications:    CAD native vessel 414.01  History:        Patient has prior history of Echocardiogram examinations, most recent 05/30/2018. CAD.  Sonographer:    Tinnie Barefoot Referring Phys: 19 CHRISTOPHER D MCALHANY  IMPRESSIONS   1. The left ventricle has low normal systolic function, with an ejection fraction of 50-55%. The cavity size was normal. Left ventricular diastolic Doppler parameters are consistent with impaired relaxation. 2. LVEF is approximately 50 to 55% with severe hypokinesis of the inferor/inferoseptal walls. 3. The right ventricle has mildly reduced systolic function. The cavity was mildly enlarged. There is no increase in right ventricular wall thickness. 4. The inferior vena cava was dilated in size with <50% respiratory variability. 5. The interatrial septum was not assessed.  FINDINGS Left Ventricle: The left ventricle has low normal systolic function, with an ejection fraction  of 50-55%. The cavity size was normal. There is no increase in left ventricular wall thickness. Left ventricular diastolic Doppler parameters are consistent with impaired relaxation. LVEF is approximately 50 to 55% with severe hypokinesis of the inferor/inferoseptal walls.  Right Ventricle: The right ventricle has mildly reduced systolic function. The cavity was mildly enlarged. There is no increase in right ventricular wall thickness.  Left Atrium: Left atrial size was normal in size.  Right Atrium: Right atrial size was normal in size. Right atrial pressure is estimated at 8 mmHg.  Interatrial Septum: The interatrial septum was not  assessed.  Pericardium: There is no evidence of pericardial effusion.  Mitral Valve: The mitral valve is normal in structure. Mitral valve regurgitation is mild by color flow Doppler.  Tricuspid Valve: The tricuspid valve is normal in structure. Tricuspid valve regurgitation is trivial by color flow Doppler.  Aortic Valve: The aortic valve is normal in structure. Aortic valve regurgitation was not visualized by color flow Doppler.  Pulmonic Valve: The pulmonic valve was grossly normal. Pulmonic valve regurgitation is trivial by color flow Doppler.  Venous: The inferior vena cava is dilated in size with less than 50% respiratory variability.   +--------------+--------++ LEFT VENTRICLE         +---------------+---------++ +--------------+--------++ Diastology               PLAX 2D                +---------------+---------++ +--------------+--------++ LV e' medial:  6.64 cm/s LVIDd:        4.60 cm  +---------------+---------++ +--------------+--------++ LV E/e' medial:11.3      LVIDs:        3.50 cm  +---------------+---------++ +--------------+--------++ LV PW:        1.40 cm  +--------------+--------++ LV IVS:       1.00 cm  +--------------+--------++ LVOT diam:    2.10 cm  +--------------+--------++ LV SV:        46 ml    +--------------+--------++ LV SV Index:  24.81    +--------------+--------++ LVOT Area:    3.46 cm +--------------+--------++                        +--------------+--------++  +-----------+-------++----------++ LEFT ATRIUM       Index      +-----------+-------++----------++ LA diam:   3.30 cm1.78 cm/m +-----------+-------++----------++ +------------+-----------++ AORTIC VALVE            +------------+-----------++ LVOT Vmax:  118.00 cm/s +------------+-----------++ LVOT Vmean: 66.800 cm/s +------------+-----------++ LVOT VTI:   0.217 m      +------------+-----------++  +-------------+-------++ AORTA                +-------------+-------++ Ao Root diam:3.00 cm +-------------+-------++  +--------------+----------++ +---------------+-----------++ MV E velocity:75.20 cm/s TRICUSPID VALVE            +--------------+----------++ +---------------+-----------++ TR Peak grad:  18.3 mmHg   +---------------+-----------++ TR Vmax:       214.00 cm/s +---------------+-----------++  +--------------+-------+ SHUNTS                +--------------+-------+ Systemic VTI: 0.22 m  +--------------+-------+ Systemic Diam:2.10 cm +--------------+-------+   Vina Gull MD Electronically signed by Vina Gull MD Signature Date/Time: 05/31/2018/2:33:39 PM    Final          ______________________________________________________________________________________________       Current Reported Medications:.    Active Medications[1]  Physical Exam:    VS:  BP 110/72   Pulse 72   Ht  5' 8 (1.727 m)   Wt 156 lb 9.6 oz (71 kg)   LMP 04/11/2011   SpO2 97%   BMI 23.81 kg/m    Wt Readings from Last 3 Encounters:  01/27/24 156 lb 9.6 oz (71 kg)  01/12/24 141 lb 1.5 oz (64 kg)  11/17/23 143 lb (64.9 kg)    GEN: Well nourished, well developed in no acute distress NECK: No JVD; No carotid bruits CARDIAC: RRR, no murmurs, rubs, gallops RESPIRATORY:  Clear to auscultation without rales, wheezing or rhonchi  ABDOMEN: Soft, non-tender, non-distended EXTREMITIES:  No edema; No acute deformity     Asessement and Plan:.    CAD: Patient presented as an inferior STEMI treated with DES in the proximal and distal RCA in 2020. Stable with no anginal symptoms. No indication for ischemic evaluation.   Heart healthy diet and regular cardiovascular exercise encouraged.  Reviewed ED precautions. Continue aspirin  81 mg daily, Lipitor  80 mg daily, metoprolol  succinate 100 mg daily and spironolactone  25 mg  daily.  HTN: Blood pressure today 110/72.  Patient denies any dizziness, lightheadedness, presyncope or syncope.  Continue current antihypertensive regimen.  HLD: Last lipid profile on 10/20/2023 indicated total cholesterol 141, HDL 54, triglycerides 87 and LDL 69.  Continue atorvastatin  80 mg daily.  Tobacco use: Patient reports that she smokes 3 cigarettes in a week.  Complete cessation advised.   Disposition: F/u with Dr. Verlin in one year or sooner if needed.   Signed, Montrey Buist D Andrez Lieurance, NP       [1]  Current Meds  Medication Sig   acetaminophen  (TYLENOL ) 500 MG tablet Take 500 mg by mouth every 6 (six) hours as needed for moderate pain or headache.   aspirin  EC 81 MG tablet Take 1 tablet (81 mg total) by mouth daily.   cyclobenzaprine  (FLEXERIL ) 10 MG tablet TAKE 1 TABLET(10 MG) BY MOUTH THREE TIMES DAILY AS NEEDED (Patient taking differently: Take 10 mg by mouth 3 (three) times daily as needed for muscle spasms.)   hydroxychloroquine  (PLAQUENIL ) 200 MG tablet Take 300 mg by mouth daily. Take 1 tab today and t tabs the next day (tab hard to break)   loperamide  (IMODIUM  A-D) 2 MG tablet Take 1 tablet (2 mg total) by mouth 4 (four) times daily as needed for diarrhea or loose stools.   metoprolol  succinate (TOPROL -XL) 100 MG 24 hr tablet Take 1 tablet (100 mg total) by mouth daily. Immediately with or following a meal.   Multiple Vitamin (MULTIVITAMIN) tablet Take 1 tablet by mouth daily.   mycophenolate  (CELLCEPT ) 500 MG tablet Take 1,000 mg by mouth 2 (two) times daily.   nitroGLYCERIN  (NITROSTAT ) 0.4 MG SL tablet PLACE 1 TABLET (0.4 MG TOTAL) UNDER THE TONGUE EVERY 5 (FIVE) MINUTES AS NEEDED FOR CHEST PAIN.   pantoprazole  (PROTONIX ) 20 MG tablet TAKE 1 TABLET BY MOUTH EVERY DAY   spironolactone  (ALDACTONE ) 25 MG tablet Take 1 tablet (25 mg total) by mouth daily. KEEP DECEMBER APPOINTMENT.   [DISCONTINUED] atorvastatin  (LIPITOR ) 80 MG tablet TAKE 1 TABLET BY MOUTH EVERY DAY AT 6 PM.   NEED MD APPT FOR REFILLS   "

## 2024-01-27 ENCOUNTER — Ambulatory Visit: Attending: Cardiology | Admitting: Cardiology

## 2024-01-27 ENCOUNTER — Encounter: Payer: Self-pay | Admitting: Cardiology

## 2024-01-27 VITALS — BP 110/72 | HR 72 | Ht 68.0 in | Wt 156.6 lb

## 2024-01-27 DIAGNOSIS — I10 Essential (primary) hypertension: Secondary | ICD-10-CM | POA: Diagnosis not present

## 2024-01-27 DIAGNOSIS — I2510 Atherosclerotic heart disease of native coronary artery without angina pectoris: Secondary | ICD-10-CM | POA: Diagnosis not present

## 2024-01-27 DIAGNOSIS — E785 Hyperlipidemia, unspecified: Secondary | ICD-10-CM

## 2024-01-27 MED ORDER — ATORVASTATIN CALCIUM 80 MG PO TABS: ORAL_TABLET | ORAL | 3 refills | Status: AC

## 2024-01-27 NOTE — Patient Instructions (Signed)
 Medication Instructions:  Your physician recommends that you continue on your current medications as directed. Please refer to the Current Medication list given to you today.  *If you need a refill on your cardiac medications before your next appointment, please call your pharmacy*  Lab Work: NONE If you have labs (blood work) drawn today and your tests are completely normal, you will receive your results only by: MyChart Message (if you have MyChart) OR A paper copy in the mail If you have any lab test that is abnormal or we need to change your treatment, we will call you to review the results.  Testing/Procedures: NONE  Follow-Up: At [facility], you and your health needs are our priority.  As part of our continuing mission to provide you with exceptional heart care, our providers are all part of one team.  This team includes your primary Cardiologist (physician) and Advanced Practice Providers or APPs (Physician Assistants and Nurse Practitioners) who all work together to provide you with the care you need, when you need it.  Your next appointment:   1 year(s)  Provider:   Lonni Cash, MD

## 2024-01-30 ENCOUNTER — Other Ambulatory Visit: Payer: Self-pay | Admitting: Cardiovascular Disease

## 2024-02-05 ENCOUNTER — Telehealth (HOSPITAL_COMMUNITY): Payer: Self-pay

## 2024-02-05 NOTE — Telephone Encounter (Signed)
 Auth Submission: APPROVED Site of care: Site of care: [facility] Payer: [employer] Medication & CPT/J Code(s) submitted: [employer]  (IVIG) J1459 Diagnosis Code: M32.9, G61.89 Route of submission (phone, fax, portal): portal Phone # Fax # Auth type: Buy/Bill HB Units/visits requested: 70g q8weeks Reference number: [id number] Approval from: 11/25/22 to 01/27/25    Approval letter has been scanned into patient's media tab.

## 2024-03-22 ENCOUNTER — Encounter (HOSPITAL_COMMUNITY)

## 2024-04-19 ENCOUNTER — Ambulatory Visit: Admitting: Family Medicine

## 2024-09-07 ENCOUNTER — Ambulatory Visit
